# Patient Record
Sex: Male | Born: 1950 | Race: White | Hispanic: No | Marital: Married | Smoking: Former smoker
Health system: Southern US, Community
[De-identification: ages and names within clinical notes are randomized; demographics above are authoritative.]

## PROBLEM LIST (undated history)

## (undated) DIAGNOSIS — I10 Essential (primary) hypertension: Secondary | ICD-10-CM

## (undated) DIAGNOSIS — R569 Unspecified convulsions: Secondary | ICD-10-CM

## (undated) DIAGNOSIS — R51 Headache: Secondary | ICD-10-CM

## (undated) DIAGNOSIS — F329 Major depressive disorder, single episode, unspecified: Secondary | ICD-10-CM

## (undated) DIAGNOSIS — M549 Dorsalgia, unspecified: Secondary | ICD-10-CM

## (undated) DIAGNOSIS — K2270 Barrett's esophagus without dysplasia: Secondary | ICD-10-CM

## (undated) DIAGNOSIS — M1990 Unspecified osteoarthritis, unspecified site: Secondary | ICD-10-CM

## (undated) DIAGNOSIS — Z8719 Personal history of other diseases of the digestive system: Secondary | ICD-10-CM

## (undated) DIAGNOSIS — I2510 Atherosclerotic heart disease of native coronary artery without angina pectoris: Secondary | ICD-10-CM

## (undated) DIAGNOSIS — I639 Cerebral infarction, unspecified: Secondary | ICD-10-CM

## (undated) DIAGNOSIS — G8929 Other chronic pain: Secondary | ICD-10-CM

## (undated) DIAGNOSIS — K219 Gastro-esophageal reflux disease without esophagitis: Secondary | ICD-10-CM

## (undated) DIAGNOSIS — F419 Anxiety disorder, unspecified: Secondary | ICD-10-CM

## (undated) DIAGNOSIS — I509 Heart failure, unspecified: Secondary | ICD-10-CM

## (undated) DIAGNOSIS — F32A Depression, unspecified: Secondary | ICD-10-CM

## (undated) DIAGNOSIS — I739 Peripheral vascular disease, unspecified: Secondary | ICD-10-CM

## (undated) DIAGNOSIS — I679 Cerebrovascular disease, unspecified: Secondary | ICD-10-CM

## (undated) DIAGNOSIS — E785 Hyperlipidemia, unspecified: Secondary | ICD-10-CM

## (undated) HISTORY — DX: Gastro-esophageal reflux disease without esophagitis: K21.9

## (undated) HISTORY — DX: Cerebrovascular disease, unspecified: I67.9

## (undated) HISTORY — DX: Peripheral vascular disease, unspecified: I73.9

## (undated) HISTORY — PX: COLONOSCOPY: SHX174

## (undated) HISTORY — PX: HERNIA REPAIR: SHX51

## (undated) HISTORY — DX: Barrett's esophagus without dysplasia: K22.70

## (undated) HISTORY — DX: Cerebral infarction, unspecified: I63.9

## (undated) HISTORY — PX: LAPAROSCOPIC NISSEN FUNDOPLICATION: SHX1932

## (undated) HISTORY — PX: ESOPHAGOGASTRODUODENOSCOPY: SHX1529

## (undated) HISTORY — PX: CARDIAC CATHETERIZATION: SHX172

## (undated) HISTORY — PX: DIAGNOSTIC LAPAROSCOPY: SUR761

## (undated) HISTORY — DX: Essential (primary) hypertension: I10

## (undated) HISTORY — DX: Hyperlipidemia, unspecified: E78.5

## (undated) HISTORY — DX: Atherosclerotic heart disease of native coronary artery without angina pectoris: I25.10

---

## 2007-09-04 ENCOUNTER — Inpatient Hospital Stay (HOSPITAL_COMMUNITY): Admission: EM | Admit: 2007-09-04 | Discharge: 2007-09-09 | Payer: Self-pay | Admitting: Emergency Medicine

## 2007-09-10 ENCOUNTER — Inpatient Hospital Stay (HOSPITAL_COMMUNITY): Admission: EM | Admit: 2007-09-10 | Discharge: 2007-09-12 | Payer: Self-pay | Admitting: Emergency Medicine

## 2007-10-12 ENCOUNTER — Inpatient Hospital Stay (HOSPITAL_COMMUNITY): Admission: EM | Admit: 2007-10-12 | Discharge: 2007-10-14 | Payer: Self-pay | Admitting: Emergency Medicine

## 2007-10-29 ENCOUNTER — Ambulatory Visit: Payer: Self-pay | Admitting: Internal Medicine

## 2007-11-02 HISTORY — PX: CORONARY ANGIOPLASTY WITH STENT PLACEMENT: SHX49

## 2007-11-03 ENCOUNTER — Inpatient Hospital Stay (HOSPITAL_COMMUNITY): Admission: EM | Admit: 2007-11-03 | Discharge: 2007-11-05 | Payer: Self-pay | Admitting: Emergency Medicine

## 2008-09-02 HISTORY — PX: LAPAROSCOPIC GASTRIC BANDING: SHX1100

## 2010-05-31 DIAGNOSIS — K219 Gastro-esophageal reflux disease without esophagitis: Secondary | ICD-10-CM | POA: Insufficient documentation

## 2011-01-15 NOTE — Cardiovascular Report (Signed)
NAME:  Justin Wells, Justin Wells NO.:  [id number]   MEDICAL RECORD NO.:  [id number]          PATIENT TYPE:  INP   LOCATION:  NA                           FACILITY:  [facility]   PHYSICIAN:  Vonna Kotyk R. Jacinto Halim, MD       DATE OF BIRTH:  [birth year]   DATE OF PROCEDURE:  09/11/2007  DATE OF DISCHARGE:                            CARDIAC CATHETERIZATION   ATTENDING CARDIOLOGIST:  Nanetta Batty, M.D.   PROCEDURE PERFORMED:  1. PTCA and balloon angioplasty of the previously placed 2.5 x 15 mm      overlapping stent into the circumflex and obtuse marginal branch,      performed on September 07, 2007.  2. PTCA and balloon angioplasty of the obtuse marginal one branch of      the circumflex coronary artery.  3. PTCA and balloon angioplasty of the diagonal one branch of the LAD.   INDICATION:  Mr. Justin Wells is a 60 year old gentleman with known  coronary artery disease who was admitted with unstable angina and  underwent PTCA and stenting of mid circumflex coronary artery by Dr.  Nanetta Batty with implantation of a 2.5 x 15 mm overlapping stent into  the main body of the mid circumflex and into the obtuse marginal two  branch.  Distal circumflex had no significant stenosis and was left  alone.  Because of recurrent chest discomfort and shortness of breath,  and he has known 90% stenosis in the diagonal one branch of the LAD, he  was brought back to the cardiac catheterization lab to reevaluate his  coronary stents and also to look for possible angioplasty of the  diagonal branch of the LAD.   ANGIOGRAPHIC DATA:  Left main:  The left main is large and normal.   Circumflex coronary artery:  The circumflex coronary artery has an acute  angle take-off.  It is smooth in the proximal segment.  It gives origin  to a very small obtuse marginal one, which has a high-grade 99% proximal  to mid stenosis.  Followed by this, the circumflex gives origin to a  large obtuse marginal two.  The  previously placed stents in the obtuse  marginal and extending into the body of the mid circumflex are widely  patent; however, the proximal segment of the stent appeared to be not  completely apposed to the artery.   LAD:  The LAD is a large-caliber vessel.  Gives origin to a moderate-to  large-sized diagonal one, which had a focal 90% stenosis.  Just distal  to the stenosis, there was a hinge point noted in the diagonal one  branch.  The LAD itself is smooth and normal and the mid segment has  significant intramyocardial bridging with almost near- complete  obliteration of the LAD during systole.   INTERVENTION DATA:  Difficult procedure.  Successful PTA and balloon  angioplasty of the circumflex stent with dilatation with a 2.75 x 8 mm  Quantum balloon performed at 20 atmospheres of pressure.  Multiple  balloon inflations were performed throughout the stent and also the in-  flow of the stent.  Post balloon angioplasty, I was able to easily  advance the balloon without any difficulty from the left main into the  circumflex coronary artery.  Previously this had been difficult, and the  balloon was hanging onto the stent strut.  TIMI III flow was maintained  at the end of the procedure.   Successful PTCA and balloon angioplasty of the proximal to mid segment  of the small obtuse marginal one branch with a 1.5 x 15 mm Voyager  balloon performed at anywhere from 3 to 14 atmospheres of  pressure,  anywhere from 45 seconds to 90 seconds.  Post balloon angioplasty, a 99%  stenosis was reduced to less than 10% with excellent TIMI III flow.  There was no evidence of dissection.   Successful PTCA and balloon angioplasty of the diagonal one branch of  the LAD with reduction of stenosis from 90% to less than 10%.  A 2.5 x  15 mm Voyager balloon was utilized and a peak atmosphere pressure of 16  atmospheres of pressure was utilized for balloon angioplasty into the  diagonal branch.  Excellent  TIMI III flow was maintained without any  residual dissection or thrombus.   RECOMMENDATIONS:  The patient will be continued on aggressive risk  modification.  He has no significant coronary disease in the right  coronary artery.  He will need risk modification.   A total of 270 mL of contrast was utilized for diagnostic angiography.   TECHNIQUE:  Procedure in the usual sterile precautions using a 69F right  femoral arterial access site using Angiomax for anticoagulation.  Initially a 7-French FL4 guide was utilized for attempted angioplasty of  the stents; however, because of difficulty in advancing the wire and  also once I had the wire passing through the obtuse marginal branch, I  was having difficulty in advancing the balloon.  I had very poor guide  support; hence, all the  equipment was withdrawn and a 7-French XB 3.5  guide was utilized to engage left main coronary artery.  With this, I  was able to advance the [facility] and also because of inability to  advance a 3.0 x 15 mm Voyager balloon over the guidewire, I utilized a  Wiggle wire and again had difficulty.  I then exchanged this to a 2.75 x  8 mm Quantum Maverick, and again I had difficulty in advancing the wire  even proximal to the stent and also at the in-flow of the stent site.  Hence a BMW wire was utilized and I was able to prolapse this wire  through the stent and placed it distally into the distal obtuse marginal  two.  Then I was able to advance the balloon, and multiple balloon  angioplasty was performed throughout the entire segment of the mid  segment of the circumflex and also into the stent struts at the in-flow  at peak 20 atmospheres of pressure.  Having performed this angioplasty,  excellent results were noted.  There was mild spasm noted in the outflow  of the stent; however, this was relieved with intracoronary  nitroglycerin.  TIMI III flow was maintained.  Then the BMW guidewire  was gently  withdrawn back and advanced into the obtuse marginal one,  which was a tiny vessel which had a high-grade 99% stenosis.  I used a  1.5 x 15 mm Voyager balloon and multiple balloon angioplasties were  performed, anywhere from 3 to 14 atmospheres of  pressure.  Having  performed this, excellent  results were noted.  Then I advanced the same  BMW guidewire into the diagonal one branch of the LAD and the 1.5  balloon was utilized and multiple angioplasties were performed, again up  to 14 atmospheres of pressure.  Because of significant residual stenosis  of greater than 50%, I then used the 2.0 x 15 mm Voyager and again 14  atmospheres of pressure balloon dilatation was performed for 90 seconds.  Post balloon angioplasty, excellent results were noted.  Having  performed this, guidewire was withdrawn, angiography repeated , guide  catheter disengaged and pulled out of the body.  Right femoral  arteriography was performed through the arterial access sheath, access  closed with StarClose with excellent hemostasis.  The patient tolerated  the procedure.  No immediate complications noted.      Cristy Hilts. Jacinto Halim, MD  Electronically Signed     JRG/MEDQ  D:  09/11/2007  T:  09/11/2007  Job:  [id number]   cc:   Nanetta Batty, M.D.

## 2011-01-15 NOTE — Cardiovascular Report (Signed)
NAME:  Justin Wells, Justin NO.:  [id number]   MEDICAL RECORD NO.:  [id number]          PATIENT TYPE:  INP   LOCATION:  6524                         FACILITY:  [facility]   PHYSICIAN:  Madaline Savage, M.D.DATE OF BIRTH:  03-12-1951   DATE OF PROCEDURE:  10/13/2007  DATE OF DISCHARGE:                            CARDIAC CATHETERIZATION   PROCEDURES PERFORMED:  1. Selective coronary angiography by Judkins technique.  2. Retrograde left heart catheterization.  3. Left ventricular angiography.   INTERVENTIONS:  None.   COMPLICATIONS:  None.   ENTRY SITE:  Right femoral.   PATIENT PROFILE:  Justin Wells is a 60 year old gentleman who is a  cardiology patient of Dr. Nanetta Batty and a primary care patient of  Dr. Earlene Plater.  He has had previous percutaneous coronary artery stenting of  his circumflex obtuse marginal branch proximally, and was admitted to  the hospital recently with another episode of chest pain that is nitrate  responsive.  Because of the recurrence of the chest pain symptoms, this  cardiac catheterization is performed.  It was done so electively without  any immediate complications.   RESULTS:  Pressures:  Left ventricular pressure was 127/14, end-  diastolic pressure 28.  Central aortic pressure was 117/64, mean of 85,  the 10 mm aortic valve gradient is at the upper limits of normal for  aortic valve gradients.   ANGIOGRAPHIC RESULTS:  The patient shows a radio-opaque stent in his  proximal circumflex coronary artery that is a radio-opaque and easily  visible on ventriculogram.  The ventriculogram itself shows normal  contractility with a little ectopy and no evidence of any significant  wall motion abnormalities.   The left main coronary artery is normal.  The left anterior descending  coronary artery is normal.  The diagonal branch of the left anterior  descending coronary artery is a long, medium size vessel approximately  2.75 mm diameter that has  lumpy bumpy irregularities in two spots, but  nothing that is obstructive in appearance.  The LAD itself looks normal.   The circumflex coronary artery contains a radio-opaque stent at the  junction where the circumflex itself and the large obtuse marginal  branch bifurcates.  There is noted to be a radio-opaque stent that is  widely patent.  There is a little bit of a discrepancy between the stent  diameter and the immediate post stent appearance after the stent, but  there is no evidence of blood flow abnormalities or any appearance of a  lesion.  Similarly, there does not appear to be a stenosis at the  junction between the circumflex and the obtuse marginal branch.   Right coronary artery was previously a small vessel that was not  intervened upon during his last catheterization and this time the vessel  was injected with intracoronary nitroglycerin 200 mcg and appeared to be  at least a 2.5 mm vessel with 30% area of eccentric luminal irregularity  midway down the vessel.  It was smooth.  It does not appear to be  significant enough to be causing symptomatology.   Left ventricular angiography shows  vigorous contractility of all wall  segments with an ejection fraction of 60% and no evidence of mitral  regurgitation.   FINAL IMPRESSION:  1. Patent stent to the circumflex obtuse marginal branch.  New lesions      of 30% in mid right coronary artery and proximal diagonal branch of      the left anterior descending.  2. Normal LV systolic function.   PLAN:  The patient may be eligible for discharge this evening, assuming  that no groin complications or recurrent neurologic events occur this  evening.  He would then follow up with Dr. Nanetta Batty as an  outpatient.           ______________________________  Madaline Savage, M.D.     WHG/MEDQ  D:  10/13/2007  T:  10/14/2007  Job:  [id number]   cc:   [facility]

## 2011-01-15 NOTE — Discharge Summary (Signed)
NAME:  Justin Wells, Justin Wells NO.:  [id number]   MEDICAL RECORD NO.:  [id number]          PATIENT TYPE:  INP   LOCATION:  6524                         FACILITY:  [facility]   PHYSICIAN:  Nanetta Batty, M.D.   DATE OF BIRTH:  July 10, 1951   DATE OF ADMISSION:  10/12/2007  DATE OF DISCHARGE:  10/14/2007                               DISCHARGE SUMMARY   DISCHARGE DIAGNOSES:  1. Chest pain with patent stent to the obtuse marginal-II and      nonobstructive disease of only 30% to the diagonal and right      coronary artery.  Negative myocardial infarction.  2. Recurrent chest pain, though cardiac with certainly initial      problem.  At this point, rule out gastrointestinal or cervical      disk.  3. Dizziness and headache, post-cardiac catheterization x3, we      question secondary reaction.  4. Anxiety disorder.  5. Recent off-balance symptoms, improved with iron.  6. History of hypertension.  7. 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  8. History of Barrett's esophagus.  9. Status post Nissen fundoplication.  10.Peptic ulcer disease.   CONDITION ON DISCHARGE:  Improved.   PROCEDURE:  On October 13, 2007, left heart catheterization by Dr.  Madaline Savage with patent stent to the obtuse marginal-II and  minimal disease of the diagonal and right coronary artery, with normal  ejection fraction.   DISCHARGE MEDICATIONS:  1. Valium 5 mg twice daily, as before.  2. Simvastatin 40 mg daily.  3. Plavix 75 mg daily.  4. Aspirin 162 mg daily.  5. Prilosec 20 mg, increased to twice daily.  6. Paxil 40 mg daily.  7. Ziac 10/6.25 mg daily as before.  8. Xanax 0.25 mg p.r.n.  9. Darvocet-N 100, one to two tab q.6h. p.r.n.  10.Percocet one to two tab q.6h. p.r.n.  11.Nitroglycerin sublingual p.r.n.  12.Diovan 80 mg daily.  13.Isosorbide dinitrate 10  mg, one three times daily.  Was given a      prescription as an outpatient prior to this admission.   CONSULTATIONS:  Please note that a GI consultation was obtained with Dr.  Wilhemina Bonito. Marina Goodell.  The recommendations from them were to consider a C-spine  MRI for degenerative disk disease as a cause of his pain.  Increase the  PPI to twice daily for four to six weeks, to see if this helps.  Also  consider a gallbladder ultrasound as an outpatient.  The patient to  receive GI care with his gastroenterologist.   HISTORY OF PRESENT ILLNESS:  The patient has had two catheterizations  prior to this admission, presented to [facility] on October 12, 2007, with chest pain.  Over the week before he had been having daily  chest pain episodes.  On the day of admission he was diaphoretic and  felt terrible with chest tightness.  He took nitroglycerin which  improved his pain, but then he  developed increased anxiety and took his  anti-anxiety medication.  He just felt significant worse than he had in  a while, so he went to [facility] and was transferred to [facility].   The patient had also, within the week prior this, had a Persantine  Myoview in our office that was negative for ischemia.   PAST MEDICAL/SURGICAL HISTORY:  1. Coronary artery disease with history of stent in the OM-II.  2. After that procedure he developed a headache and off balance.  He      actually went home and was then readmitted within 24 hours.  A      neuro consultation was obtained, and they did diagnose him with a      CVA, a small left-sided brain stem infarction.  He was placed on      Valium and has been weaned off of that to only as needed at this      point for balance disturbance, which has now improved.  3. Anxiety disorder, which he previously did not have that we are      aware of.  4. Hypertension.  5. Dyslipidemia.  6. History of Barrett's esophagus.  7. History of Nissen  fundoplication.  8. Peptic ulcer disease.   ALLERGIES:  Cough developed with ACE inhibitor.   FAMILY/SOCIAL HISTORY/REVIEW OF SYSTEMS:  See H&P.   DISCHARGE PHYSICAL EXAMINATION:  VITAL SIGNS:  Blood pressure 125/71,  pulse 56.  EXTREMITIES:  His right groin catheterization site was stable.   NOTATION:  He was felt to be ready to be discharged, but when I went  back to review his discharge medications, he was having chest pain and  also a bad headache.  I gave him Darvocet for his headache.  I also gave  him a GI cocktail.  Shortly thereafter the GI symptoms were resolved.  We called a GI consultation.  Dr. Yancey Flemings evaluated the patient and  recommendations, as previously stated.   DISPOSITION:  The patient was without complaints that afternoon, and  felt ready to discharge.   DIET:  A low-sodium, heart-healthy diet.   ACTIVITY:  Increase activity slowly.   DISCHARGE INSTRUCTIONS:  1. May shower or bathe.  2. No lifting for two days.  3. No driving for two days.  4. Wash right groin catheterization site with soap and water.  Do not      cause any bleeding.   FOLLOWUP:  To see Dr. Nanetta Batty on October 28, 2007, at 10:30 a.m.      Darcella Gasman. Annie Paras, N.P.      Nanetta Batty, M.D.  Electronically Signed    LRI/MEDQ  D:  11/02/2007  T:  11/02/2007  Job:  [id number]   cc:   Lois Huxley, M.D.

## 2011-01-15 NOTE — Discharge Summary (Signed)
NAME:  Justin Wells, MAIORINO NO.:  [id number]   MEDICAL RECORD NO.:  [id number]          PATIENT TYPE:  INP   LOCATION:  2001                         FACILITY:  [facility]   PHYSICIAN:  Nanetta Batty, M.D.   DATE OF BIRTH:  1951-03-15   DATE OF ADMISSION:  11/02/2007  DATE OF DISCHARGE:  11/05/2007                               DISCHARGE SUMMARY   DISCHARGE CONDITION:  Improved.   DISCHARGE DIAGNOSES:  1. Chest pain.      a.     Noncardiac chest pain.      b.     Negative myocardial infarction with negative CK, MB and       troponin.  2. Recent cardiac catheterization, actually his third in 2 months,      with patent stent and nonobstructive coronary disease.  3. Known coronary artery disease with stent to the obtuse marginal #2.  4. Cervical disk disease with spondylitis, cervical spine, multilevel      neural foraminal stenosis, left greater than right, C4-5 level is      most severely affected, affected left greater than right.      a.     No neurologic deficit.  5. Gastroesophageal reflux disease.  (Please note, the patient states      on Nexium he never has this type of pain.  Once the Nexium was      changed to Prilosec from [employer], the pain started).  6. History of Barrett's esophagus with  Nissen fundoplication and      redo.  7. Dyslipidemia.  8. Cerebrovascular accident, mild, in January 2009.  9. Anxiety.  10.Hypertension, controlled.  11.Lower extremity edema due to medication changes during this      hospitalization.   DISCHARGE MEDICATIONS:  1. Six-day Medrol Dosepak per Dr. Phoebe Perch.  2. Protonix 40 mg one twice a day for one week, and then daily.      Please note, insurance would not pay for Protonix in the past but      he has now failed Prilosec.  3. Simvastatin 40 mg daily.  4. Plavix 75 mg daily.  5. Aspirin 81 mg two daily.  6. Paxil 40 mg daily.  7. Ziac 10/6.25 mg one daily.  8. Valium 5 mg twice a day as needed as before.  9.  Xanax 0.25 mg every 12 hours as needed.  10.Diovan 80 mg daily.  11.Nitroglycerin sublingual as needed for chest pain.  12.Hydrocodone 10/325 mg one every 4 hours as needed for headache or      take a Percocet.   FOLLOW-UP:  1. With Dr. Phoebe Perch of neurosurgery.  The office will call with date      and time in 4-6 weeks.  2. Follow up with your gastroenterologist in 1-2 weeks.  3. Follow up with primary care, Dr. Earlene Plater, this week for further      management.  4. Dr. Phoebe Perch recommended outpatient physical therapy, which Dr.      Alanda Amass believes should wait until after the steroid Dosepak is      completed.  He did not  feel there needed to be any surgical      intervention currently but treat with outpatient physical therapy      or also could try a trial of traction.  Nonsteroidals may be      helpful, per Dr. Phoebe Perch, but with aspirin and Plavix and GI history      it is probably not practical.   Please note:  The patient and his wife were very frustrated.  These  symptoms continue to proceed and while the cervical disk disease is  responsible for his paresthesias in the left arm and probable headache,  all of his symptoms cannot be related to that.  Our cardiologists do not  feel this is cardiac and we do feel a GI workup is warranted with his  significant history.   Please note:  The patient stated when he was no Nexium he had none of  these problems and the Prilosec has never helped his stomach pain.  Therefore, we will hope to get either Protonix or Nexium to attempt as  he has failed Prilosec.   Patient and wife prefer to go back and see Dr. Earlene Plater, as we have cleared  him from a cardiology standpoint, for further direction.  The wife at  one point during the hospitalization wanted to go back to [facility]  for further evaluation, which we agree with, as they have seen him in  the past, and Dr. Earlene Plater will arrange that for him if he thinks that is  appropriate.   HISTORY OF  PRESENT ILLNESS:  A 60 year old white male with coronary  disease, stent to the circumflex OM and angioplasty of the diagonal  branch in January 2009, presented to [facility] on November 03, 2007, again with  acute onset of chest pain while driving, substernal chest pain radiating  to the neck, left arm discomfort, numbness, tingling.  He becomes  profusely diaphoretic.  Even before he states he has the pain, his wife  will look at him and he is extremely diaphoretic.  He also gets short of  breath and nauseated.  The wife called EMS.  He was given sublingual  nitroglycerin with only mild relief.  He was a little bradycardic but no  acute changes otherwise on his EKG.  Once he got to the hospital on IV  nitrate, he was pain-free.  He had a negative Persantine Myoview on  September 28, 2007, and then a heart catheterization following that, which  again revealed patent stent and patent coronary arteries.  He was put on  long-acting nitrates as well.   At one point during the prior hospitalization the morning after his  catheterization, he had return chest pain.  He was given a GI cocktail,  which totally relieved those symptoms at that time.  He does continue  with headaches, though.   Other history as described in his past medical history:  He had a mild  CVA after initial PCI that was very lightheaded and dizzy and Dr. Earlene Plater  put him on a tapering dose of Valium, which resolved his dizziness.   Family history, social history, review of systems:  Please see H&P.   ALLERGIES:  He does have an ACE cough but no allergies.   PHYSICAL EXAM AT DISCHARGE:  Blood pressure 122/65, pulse 78,  temperature 98.  HEART:  Regular rate and rhythm.  He was in sinus rhythm.  CHEST:  Clear.   LABORATORY DATA:  Hemoglobin on admission 13.7, hematocrit 39.5, WBC  10.2, platelets 189, MCV 90, neutrophils 76.  White count slightly up at  11.4 at discharge.  This was after IV Decadron was given, though.  Chemistry:   Sodium 134, potassium 4.3, chloride 103, CO2 25, BUN 10,  creatinine 1.01, glucose was 190, again this was after Decadron,  previously his glucose on admission.  PT 12.9, INR of 1, PTT 31.  GI:  AST was normal at 24, ALT 29, alkaline phosphatase 59, total bilirubin  0.8.  Amylase 54, lipase 20.  Albumin 3.9.  Cardiac enzymes were  negative.  Troponin 0.02 x3.  CKs range 87-114 with MBs 1.4-1.9.  Negative MI.  Total cholesterol 119, LDL 62, HDL 34, triglycerides 108.  Electrolytes:  Calcium is 9.3, magnesium 2.0.  Glycohemoglobin was 5.1.  TSH 1.966.  H. pylori was negative.  Cervical MRI:  C2-3, mild bilateral  facet hypertrophy, otherwise normal.  C3-4, left greater than right  facet uncovertebral hypertrophy resulting in mid-left neural foraminal  stenosis, otherwise normal.  C5 radiculitis.  C5-6 was normal.  C6-7,  mild right uncovertebral hypertrophy resulting in mild to moderate right  neural foraminal stenosis, otherwise normal.  The C4-5 level was most  severely affected, left greater than right, may be source for subsequent  C5 radiculitis.  No central spinal stenosis or mass effect on the spinal  cord.   HOSPITAL COURSE:  The patient was admitted and placed on IV heparin and  nitroglycerin.  His Ziac was stopped and he was put on Lopressor here  but by the end of his stay started having increasing edema so was  discharged back on his Ziac, which has hydrochlorothiazide.   We did get a GI consult.  They were going to do an EGD but then the MRI  of the cervical spine showed stenosis and definitely some of his  symptoms are related to this, but they stopped the EGD.  They would  prefer him to see his own gastroenterologist before we proceeded.  He  also may be having slow gastric emptying.   On March 4 we had a neurosurgeon, Dr. Phoebe Perch, see the patient, who  recommended steroid dosing.  Of course, if this does not help he may be  a candidate for epidural steroid injection as  well.  He did get three  doses of IV Decadron before he had to leave the hospital.  He will be on  an outpatient Medrol Dosepak for 6 days.  He will follow up with Dr.  Earlene Plater, who may proceed to set him up for appointments at [facility] for  further GI evaluation and further cervical evaluation if the patient  chooses to go there instead of back to [facility] for further management.  Additionally, the physical therapy Dr. Earlene Plater will arrange as there is a  place at [facility] he can use to go to, and the patient's wife agree  that she would prefer it to be there.  At time of discharge Dr.  Alanda Amass talked to the patient and his wife at length to review all the  issues and that he again did not feel this was cardiac, but the patient  has had multiple problems in the past.  They were both anxious, continue  with significant pain, headache is his greatest pain complaint at this  point.   We will see him back in follow-up, certainly to call if they have  further questions or need further assistance.      Darcella Gasman. [place], [place].P.  Nanetta Batty, M.D.  Electronically Signed    LRI/MEDQ  D:  11/05/2007  T:  11/05/2007  Job:  [id number]   cc:   Lois Huxley, MD  Clydene Fake, M.D.  [facility]

## 2011-01-15 NOTE — Cardiovascular Report (Signed)
NAME:  Justin Wells, Justin Wells NO.:  [id number]   MEDICAL RECORD NO.:  [id number]          PATIENT TYPE:  INP   LOCATION:  2807                         FACILITY:  [facility]   PHYSICIAN:  Nanetta Batty, M.D.   DATE OF BIRTH:  1950/09/29   DATE OF PROCEDURE:  DATE OF DISCHARGE:                            CARDIAC CATHETERIZATION   Justin Wells is a 60 year old mildly overweight married white male,  history hypertension, GERD, depression and Barrett's esophagus, status  post Nissen fundoplication x2 in the past.  He is a remote tobacco  abuser.  He was admitted September 04, 2007, with chest pain, diaphoresis  and near-syncope along with shortness of breath.  He ruled out for  myocardial function.  There were no acute EKG changes.  He was  heparinized and presents now for diagnostic angiography to define his  anatomy and rule out an ischemic etiology.   DESCRIPTION OF PROCEDURE:  The patient was brought to the second floor  [facility] cardiac cath lab in a postabsorptive state.  He was  premedicated with p.o. Valium, IV Versed and fentanyl.  His right groin  was prepped and shaved in the usual sterile fashion, 1% Xylocaine was  used for local anesthesia.  A 6-French sheath was inserted into the  right femoral artery using standard Seldinger technique.  Right and left  6-French Judkins diagnostic catheters as well as a 6-French pigtail  catheter were used for selective coronary angiography, left  ventriculography and distal abdominal aortography.  Visipaque dye was  used for the entirety of the case.  Retrograde aortic, left ventricular  and pullback pressures were recorded.   HEMODYNAMIC RESULTS:  1. Aortic:  Aortic systolic pressure 140, diastolic pressure 79.  2. Left ventricular systolic pressure 130, end-diastolic pressure 17  3. There did not appear to be a pullback gradient.   SELECTIVE CORONARY ANGIOGRAPHY:  1. Left main:  Normal.  2. LAD:  LAD gave off a large first  diagonal, which was the almost      equal in caliber to the LAD itself.  The diagonal branch had an 80%      segmental mid stenosis.  The LAD itself had 30% segmental mid      stenosis.  3. Left circumflex:  This was a codominant vessel.  It gave off a      small first marginal branch.  It had a fairly focal proximal 80%      stenosis in its proximal third.  There was a hypodense lesion in      the AV groove circumflex just prior the takeoff of a second large      marginal branch, which was felt to have 80% segmental stenosis in      its proximal to midportion.  There was a third small marginal      branch and then a small PDA, which were free of significant      disease.  4. Right coronary artery:  This a small, codominant vessel which was      free of significant disease.   LEFT VENTRICULOGRAPHY:  RAO left ventriculogram  was performed using of  25 mL of Visipaque dye at 12 mL/sec.  The overall LVEF was estimated at  greater than 60% without focal wall motion abnormality.   DISTAL ABDOMINAL AORTOGRAPHY:  Distal abdominal aortogram was performed  using 20 mL of Visipaque dye at 20 mL/sec.  The renal arteries were  widely patent.  The infrarenal abdominal aorta and iliac bifurcation  appear free of significant atherosclerotic changes.   IMPRESSION:  Justin Wells has what appears to be a physiologically  significant codominant AV groove circumflex and obtuse marginal lesion  as well as a diagonal branch lesion in a moderate to large diagonal  branch.  We will proceed with IVUS-guided percutaneous coronary  intervention and stenting of his obtuse marginal and circumflex using a  drug-eluting stent and Angiomax.   DESCRIPTION OF PROCEDURE:  The existing 6-French sheath in the right  femoral artery was exchanged over a wire for a 7-French sheath.  The  patient was on aspirin and received 600 mg of Plavix p.o. along with 20  mg of Pepcid IV for heartburn related to this.   Using a  7-French XB 3.5 guide catheter along with two Cornelius Moras 4190 size  soft wires and a 3.2 [place] Galaxy IVUS catheter, intravascular  ultrasound was performed of the AV groove circumflex.  The distal  reference measured 2.5 x 2.6.  The lesion itself just prior to the  takeoff of OM-1 had a luminal circumference of 2.58 sq. mm and the  lesion measured 1.67 x 1.94.  The proximal reference in the AV groove  circumflex was approximately 3.3 x 3.5; however, the lumen within the  lesion measured about 2.75 x 2.75.  The IVUS catheter occluded the  circumflex, resulting in ST-segment elevation, hypotension, bradycardia,  nausea, vomiting and diaphoresis.  This resolved after administration of  some nitroglycerin, removal of the IVUS catheter and treatment with  fluid resuscitation and atropine with resolution of ST-segment elevation  and an increase in heart rate and blood pressure.   A wire was then placed into the circumflex obtuse marginal branch #1  using double wire technique.  Using a Promus 21.5 x 15 drug-eluting  stent, this was placed across the wire into OM-1 and deployed at 12  atmospheres, resulting in a reduction of 80% proximal OM-1 stenosis to  0% residual.  Following this the AV groove circumflex wire was withdrawn  and another 2.5 x 15 Promus stent was then placed across the  continuation of the AV groove circumflex straddling OM-1 within the  lesion and deployed at 12-14 atmospheres, resulting in reduction of an  80% lesion to 0% residual.  There was maintenance of patency of the AV  groove circumflex beyond the OM-1, which was jailed by the stent.  The  patient tolerated the procedure well.  The guidewire and catheter were  removed.  The sheath was sewn securely in place.  He left the lab in  stable condition.   Plans will be to treat him with aspirin and Plavix and probably  discharge him home in the morning with consideration for a staged  diagonal branch intervention at some  point in the near future.  He left  the lab in stable condition.      Nanetta Batty, M.D.  Electronically Signed     JB/MEDQ  D:  09/07/2007  T:  09/07/2007  Job:  [id number]   cc:   [facility]  Lois Huxley, MD

## 2011-01-15 NOTE — Discharge Summary (Signed)
NAME:  Justin Wells, Justin Wells NO.:  [id number]   MEDICAL RECORD NO.:  [id number]          PATIENT TYPE:  INP   LOCATION:  6523                         FACILITY:  [facility]   PHYSICIAN:  Nanetta Batty, M.D.   DATE OF BIRTH:  [birth year]   DATE OF ADMISSION:  09/10/2007  DATE OF DISCHARGE:  09/12/2007                               DISCHARGE SUMMARY   DISCHARGE DIAGNOSES:  1. Coronary disease, recent OM2 and circumflex Promus stenting September 07, 2007, with in-stent restenosis this admission treated with      angioplasty.  2. Residual disease in the diagonal and OM1 also treated with      angioplasty this admission.  3. Apparent small brainstem stroke after the patient's initial      intervention September 07, 2007, with dizziness and headaches, seen by      Dr. [name] this admission.  4. Treated hypertension.  5. Treated dyslipidemia.  6. History of a Nissen fundoplication and Barrett's esophagus.   HOSPITAL COURSE:  The patient a 60 year old male who was recently seen  for unstable angina and had a Promus stent to the OM2 and circumflex.  He had some residual disease at that time in his diagonal that was to be  treated medically.  He was discharged but presented to Dr. Hazle Coca  office the day after discharge with complaint of headache and dizziness  and chest pressure.  He was admitted to [facility].  We were unable to  do an MRI because of the recently placed bare mental stent.  CT scan was  negative for acute MI.  The patient was seen by Dr. [name].  Dr. [name]  feels the patient has a small brain stem infarct not seen on CT.  Carotid Dopplers are ordered but these can be done as an outpatient.  The patient was taken to the cath lab September 11, 2007.  He had in-stent  restenosis in the circumflex OM2 stent that was dilated.  He also had a  99% OM1 stenosis that was angioplastied and a diagonal stenosis that was  angioplastied.  It was a difficult procedure but the patient  tolerated  it well.  We feel he could be discharged September 12, 2007.  He still has  complaints of headache and dizziness.  He will follow up with Dr. [name]  as an outpatient after he gets his carotid Dopplers.  He will see Dr.  Allyson Sabal also next week.   DISCHARGE MEDICATIONS:  1. Nitroglycerin sublingual p.r.n. for chest pain.  2. Simvastatin 40 mg a day.  3. Lotensin 5 mg a day.  4. Plavix 75 mg a day.  5. Prilosec 20 mg a day.  6. Xanax 0.25 mg q.8 h p.r.n.  7. Paxil 40 mg a day.  8. Ziac 10/62.5 daily.  9. Aspirin 325 mg a day.  10.Darvocet-N 100 1-2 q.6 h p.r.n.  11.Percocet 1-2 q.6 h p.r.n. if needed.   LABS:  White count 7.8, hemoglobin 14, hematocrit 40.7, platelets 176.  Sodium 134, potassium 4.1, BUN 12, creatinine 1.0, troponin is negative  although  on admission his troponin was 0.12, at discharge his troponin  is 0.06.  Homocysteine level is 11, hemoglobin A1c is 5.6.  TSH is 4.47.  Chest x-ray stable.  EKG sinus rhythm, sinus bradycardia without acute  changes.   DISPOSITION:  The patient is discharged in stable condition.  He will  follow up with Dr. Allyson Sabal after he gets his carotid Dopplers next week.  He will also need to follow up with Dr. [name].  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 Allyson Sabal.      Abelino Derrick, P.A.      Nanetta Batty, M.D.  Electronically Signed    LKK/MEDQ  D:  09/12/2007  T:  09/13/2007  Job:  [id number]   cc:   Lois Huxley, Dr

## 2011-01-15 NOTE — Consult Note (Signed)
NAME:  Justin Wells, Justin Wells               ACCOUNT NO.:  [id number]   MEDICAL RECORD NO.:  [id number]          PATIENT TYPE:  INP   LOCATION:  6523                         FACILITY:  [facility]   PHYSICIAN:  [name] P. [name], MD    DATE OF BIRTH:  02-23-1951   DATE OF CONSULTATION:  09/10/2007  DATE OF DISCHARGE:                                 CONSULTATION   REFERRING PHYSICIAN:  Nanetta Batty, M.D.   REASON FOR REFERRAL:  Stroke.   HISTORY OF PRESENT ILLNESS:  Mr. Loberg is a 60 year old Caucasian male  who had cardiac angioplasty stenting procedure done on September 07, 2007.  He developed some dizziness and headache post procedure.  It was thought  to be medication related.  A CT scan was done which was unremarkable.  He was discharged home, but his headache, dizziness and some nausea and  gait balance difficulties persisted.  He came back today again with  chest pain and has been admitted, but he mentioned the  symptoms, and we  have been consulted to evaluate for stroke.  The patient denies any  slurred speech, double vision, but he does state that he has some  trouble with concentration and seeing.  He has no extremity weakness or  incoordination.  There is no prior history of stroke or TIA.   PAST MEDICAL HISTORY:  Significant for:  1. Coronary artery disease status post recent stent 3 days ago.  2. Hypertension.  3. Gastroesophageal reflux disease.  4. Barrett esophagus.  5. Depression.   HOME MEDICATIONS:  1. Ziac.  2. __________  .  3. Tiazac.  4. Paxil.  5. Xanax.  6. Zocor.  7. Aspirin.  8. Plavix.  9. Prilosec.  10.Lotensin.   ALLERGIES:  None known.   FAMILY HISTORY:  Noncontributory.   SOCIAL HISTORY:  He is married, lives with his wife; does not smoke or  drink.   REVIEW OF SYSTEMS:  Comprehensive 10-system review of system is as  documented above.  Positive for chest pain and dizziness, gait  difficulties and nausea.   PHYSICAL EXAMINATION:  GENERAL:  Reveals  obese middle-aged Caucasian  male at present not in distress.  VITAL SIGNS:  Afebrile, pulse rate 61 per minute, regular respiratory  rate 16 per minute, blood pressure 114/64, sat is 98% on 2 liters.  HEAD:  Nontraumatic.  NECK:  Supple.  There is no bruit.  ENT EXAM:  Unremarkable.  CARDIAC EXAM:  No murmur or gallop.  LUNGS:  Clear to auscultation.  NEUROLOGICAL EXAM:  The patient is pleasant, awake, alert, cooperative.  There is no aphasia, apraxia, dysarthria and movements are full range;  however, there is mild left eye ptosis.  The left eye does not move  downwards well compared to the right eye; however, there is no  subjective diplopia.  Pupils are both equal and reactive.  There is  minimum left nasal labial fold asymmetry.  Tongue is midline.  Has good  cough and gag.  Motor system exam reveals no upper extremity drift.  He  has slightly diminished fine movements on the left, finger-to-nose  incoordination minimum on the left.  There is no lower extremity  weakness or __________  ataxia.  He has subjective decreased sensation  in the left body to touch and pinprick.  Gait was not tested.   DATA REVIEWED:  CT scan of the head done today as well as on January  2need, both are normal.   IMPRESSION:  56-year gentleman with sudden onset of headache, dizziness  and some imbalance following cardiac angioplasty, stenting 3 days ago,  likely due to small left-sided brain stem infarct not visualized on CT  scan 3 days ago.   PLAN:  The patient clearly has sign and symptoms consistent with a small  stroke which is subacute and 57 days old.  An MRI unfortunately cannot be  done because of recent stent; however, I will treat him conservatively,  continue antiplatelet therapy and aggressive risk factor modification;  physical, occupational, speech therapy consult.  Check carotid  ultrasound transcranial Doppler studies and hemoglobin A1c and  homocysteine levels.  Continue aggressive  treatment for lipids and blood  pressure.  I had long discussion with the patient and his wife and  answered questions.  Can recall for questions.           ______________________________  Sunny Schlein. [name], MD     PPS/MEDQ  D:  09/10/2007  T:  09/11/2007  Job:  [id number]   cc:   Nanetta Batty, M.D.

## 2011-05-23 LAB — CBC
HCT: 40.6
HCT: 40.7
HCT: 44.5
HCT: 47.1
Hemoglobin: 14
Hemoglobin: 14.4
Hemoglobin: 14.6
MCHC: 34.4
MCHC: 35.3
MCHC: 35.3
MCV: 87.9
MCV: 88.1
MCV: 88.6
MCV: 89.3
MCV: 89.5
Platelets: 170
Platelets: 174
Platelets: 189
Platelets: 195
Platelets: 202
RBC: 4.67
RBC: 4.72
RBC: 4.74
RBC: 5.01
RBC: 5.28
RDW: 13.4
RDW: 13.5
RDW: 13.6
WBC: 10
WBC: 10
WBC: 8.4
WBC: 8.9
WBC: 9.3

## 2011-05-23 LAB — BASIC METABOLIC PANEL
BUN: 10
BUN: 10
BUN: 12
BUN: 15
CO2: 27
CO2: 30
CO2: 31
Calcium: 8.5
Calcium: 8.9
Calcium: 9.4
Chloride: 100
Chloride: 97
Chloride: 98
Chloride: 98
Chloride: 99
Creatinine, Ser: 1.04
Creatinine, Ser: 1.06
Creatinine, Ser: 1.06
GFR calc Af Amer: >60
GFR calc Af Amer: >60
GFR calc Af Amer: >60
GFR calc Af Amer: >60
GFR calc non Af Amer: >60
GFR calc non Af Amer: >60
GFR calc non Af Amer: >60
Glucose, Bld: 104 — ABNORMAL HIGH
Glucose, Bld: 106 — ABNORMAL HIGH
Potassium: 3.8
Potassium: 4.1
Potassium: 4.5
Sodium: 133 — ABNORMAL LOW
Sodium: 134 — ABNORMAL LOW

## 2011-05-23 LAB — COMPREHENSIVE METABOLIC PANEL
ALT: 37
AST: 28
Albumin: 4
Alkaline Phosphatase: 63
Chloride: 100
Potassium: 4.3
Sodium: 140
Total Bilirubin: 0.4

## 2011-05-23 LAB — HEMOGLOBIN A1C
Hgb A1c MFr Bld: 5.6
Mean Plasma Glucose: 122

## 2011-05-23 LAB — PROTIME-INR
INR: 0.9
Prothrombin Time: 13.1
Prothrombin Time: 13.2
Prothrombin Time: 13.2

## 2011-05-23 LAB — LIPID PANEL
HDL: 36 — ABNORMAL LOW
Total CHOL/HDL Ratio: 5.3
Triglycerides: 132

## 2011-05-23 LAB — CK TOTAL AND CKMB (NOT AT ARMC)
CK, MB: 1.4
CK, MB: 1.5
CK, MB: 2.8
Relative Index: 1.4
Relative Index: INVALID
Relative Index: INVALID
Relative Index: INVALID
Total CK: 72
Total CK: 74
Total CK: 75
Total CK: 80
Total CK: 81

## 2011-05-23 LAB — TROPONIN I
Troponin I: 0.09 — ABNORMAL HIGH
Troponin I: 0.25 — ABNORMAL HIGH
Troponin I: 0.32 — ABNORMAL HIGH
Troponin I: 0.37 — ABNORMAL HIGH

## 2011-05-23 LAB — DIFFERENTIAL
Basophils Relative: 1
Basophils Relative: 1
Lymphocytes Relative: 17
Lymphocytes Relative: 23
Lymphs Abs: 1.6
Lymphs Abs: 1.9
Monocytes Absolute: 0.6
Monocytes Relative: 10
Monocytes Relative: 5
Monocytes Relative: 8
Neutro Abs: 5.3
Neutro Abs: 6.6
Neutro Abs: 9.2 — ABNORMAL HIGH
Neutrophils Relative %: 63
Neutrophils Relative %: 71

## 2011-05-23 LAB — B-NATRIURETIC PEPTIDE (CONVERTED LAB): Pro B Natriuretic peptide (BNP): 86

## 2011-05-23 LAB — POCT CARDIAC MARKERS
CKMB, poc: <1 — ABNORMAL LOW
Myoglobin, poc: 78
Myoglobin, poc: 79.6
Operator id: 285841
Troponin i, poc: <0.05

## 2011-05-23 LAB — CARDIAC PANEL(CRET KIN+CKTOT+MB+TROPI)
Relative Index: 1.3
Relative Index: 2.2
Total CK: 137
Troponin I: 0.12 — ABNORMAL HIGH

## 2011-05-23 LAB — HEPARIN LEVEL (UNFRACTIONATED)
Heparin Unfractionated: 0.53
Heparin Unfractionated: 0.66

## 2011-05-23 LAB — HOMOCYSTEINE: Homocysteine: 11.7

## 2011-05-23 LAB — APTT: aPTT: 34

## 2011-05-24 LAB — CBC
HCT: 37.5 — ABNORMAL LOW
MCHC: 35.2
MCV: 90.3
MCV: 90.5
Platelets: 147 — ABNORMAL LOW
Platelets: 178
RBC: 4.15 — ABNORMAL LOW
RBC: 4.47
RDW: 13.8
WBC: 5.8
WBC: 7.7

## 2011-05-24 LAB — BASIC METABOLIC PANEL
BUN: 9
CO2: 26
CO2: 27
Calcium: 8.2 — ABNORMAL LOW
Calcium: 9
Chloride: 105
Creatinine, Ser: 0.91
Creatinine, Ser: 1.04
GFR calc Af Amer: >60
GFR calc Af Amer: >60
GFR calc Af Amer: >60
GFR calc non Af Amer: >60
GFR calc non Af Amer: >60
GFR calc non Af Amer: >60
Glucose, Bld: 105 — ABNORMAL HIGH
Potassium: 4.1
Sodium: 137
Sodium: 139

## 2011-05-24 LAB — I-STAT 8, (EC8 V) (CONVERTED LAB)
Chloride: 106
HCT: 39
Hemoglobin: 13.3
Operator id: 151321
Potassium: 4.8
Sodium: 140

## 2011-05-24 LAB — HEPARIN LEVEL (UNFRACTIONATED)
Heparin Unfractionated: 0.6
Heparin Unfractionated: 0.68

## 2011-05-24 LAB — DIFFERENTIAL
Eosinophils Absolute: 0.3
Eosinophils Relative: 5
Lymphocytes Relative: 24
Lymphs Abs: 1.4
Monocytes Relative: 9

## 2011-05-24 LAB — PROTIME-INR
INR: 1
INR: 1
Prothrombin Time: 13.4
Prothrombin Time: 13.5

## 2011-05-24 LAB — COMPREHENSIVE METABOLIC PANEL
CO2: 30
Calcium: 8.8
Creatinine, Ser: 1.09
GFR calc non Af Amer: >60
Glucose, Bld: 77
Total Bilirubin: 0.8

## 2011-05-24 LAB — CK TOTAL AND CKMB (NOT AT ARMC)
CK, MB: 1.6
Relative Index: 1.5
Total CK: 107
Total CK: 109

## 2011-05-24 LAB — CARDIAC PANEL(CRET KIN+CKTOT+MB+TROPI)
Relative Index: INVALID
Total CK: 99
Troponin I: 0.01

## 2011-05-24 LAB — LIPID PANEL
LDL Cholesterol: 47
Total CHOL/HDL Ratio: 3.3
VLDL: 25

## 2011-05-24 LAB — POCT CARDIAC MARKERS: Myoglobin, poc: 59.3

## 2011-05-24 LAB — POCT I-STAT CREATININE: Creatinine, Ser: 1.1

## 2011-05-24 LAB — MAGNESIUM: Magnesium: 2.1

## 2011-05-24 LAB — B-NATRIURETIC PEPTIDE (CONVERTED LAB): Pro B Natriuretic peptide (BNP): 40

## 2011-05-24 LAB — APTT: aPTT: 31

## 2011-05-24 LAB — TSH: TSH: 3.098

## 2011-05-27 LAB — LIPID PANEL
Cholesterol: 118
LDL Cholesterol: 62

## 2011-05-27 LAB — COMPREHENSIVE METABOLIC PANEL
ALT: 28
AST: 23
AST: 24
BUN: 10
CO2: 25
CO2: 27
Calcium: 9.3
Chloride: 103
Chloride: 105
Creatinine, Ser: 1.01
Creatinine, Ser: 1.09
GFR calc Af Amer: >60
GFR calc Af Amer: >60
GFR calc non Af Amer: >60
GFR calc non Af Amer: >60
Glucose, Bld: 190 — ABNORMAL HIGH
Sodium: 138
Total Bilirubin: 0.5
Total Bilirubin: 0.8

## 2011-05-27 LAB — CBC
HCT: 39.3
HCT: 40.2
HCT: 42.6
Hemoglobin: 13.7
Hemoglobin: 13.8
Hemoglobin: 14.8
MCHC: 34.8
MCV: 90.2
MCV: 90.6
Platelets: 174
RBC: 4.38
RBC: 4.38
RBC: 4.72
RDW: 13.9
WBC: 10.2
WBC: 11.4 — ABNORMAL HIGH
WBC: 9.8

## 2011-05-27 LAB — CARDIAC PANEL(CRET KIN+CKTOT+MB+TROPI)
CK, MB: 1.9
Total CK: 100
Total CK: 87

## 2011-05-27 LAB — POCT I-STAT CREATININE
Creatinine, Ser: 1.1
Operator id: 277751

## 2011-05-27 LAB — TROPONIN I
Troponin I: 0.02
Troponin I: 0.02

## 2011-05-27 LAB — I-STAT 8, (EC8 V) (CONVERTED LAB)
Bicarbonate: 26.7 — ABNORMAL HIGH
Chloride: 104
HCT: 40
pCO2, Ven: 49.1
pH, Ven: 7.344 — ABNORMAL HIGH

## 2011-05-27 LAB — CK TOTAL AND CKMB (NOT AT ARMC)
CK, MB: 1.8
Relative Index: 1.6

## 2011-05-27 LAB — BASIC METABOLIC PANEL
CO2: 26
Calcium: 8.8
GFR calc Af Amer: >60
GFR calc non Af Amer: >60
Glucose, Bld: 95
Potassium: 3.7
Sodium: 138

## 2011-05-27 LAB — PROTIME-INR: INR: 1

## 2011-05-27 LAB — APTT: aPTT: 31

## 2011-05-27 LAB — POCT CARDIAC MARKERS
CKMB, poc: <1 — ABNORMAL LOW
Myoglobin, poc: 53.5
Troponin i, poc: <0.05

## 2011-05-27 LAB — LIPASE, BLOOD: Lipase: 20

## 2011-05-27 LAB — DIFFERENTIAL
Basophils Relative: 1
Lymphocytes Relative: 13
Lymphs Abs: 1.3
Monocytes Relative: 8
Neutro Abs: 7.8 — ABNORMAL HIGH
Neutrophils Relative %: 76

## 2011-07-04 DIAGNOSIS — I2510 Atherosclerotic heart disease of native coronary artery without angina pectoris: Secondary | ICD-10-CM | POA: Insufficient documentation

## 2011-07-04 DIAGNOSIS — I679 Cerebrovascular disease, unspecified: Secondary | ICD-10-CM | POA: Insufficient documentation

## 2012-11-05 DIAGNOSIS — I70209 Unspecified atherosclerosis of native arteries of extremities, unspecified extremity: Secondary | ICD-10-CM | POA: Insufficient documentation

## 2012-11-05 DIAGNOSIS — E162 Hypoglycemia, unspecified: Secondary | ICD-10-CM | POA: Insufficient documentation

## 2012-12-23 ENCOUNTER — Encounter: Payer: Self-pay | Admitting: Vascular Surgery

## 2013-01-11 ENCOUNTER — Encounter: Payer: Self-pay | Admitting: Vascular Surgery

## 2013-01-12 ENCOUNTER — Encounter: Payer: Self-pay | Admitting: Vascular Surgery

## 2013-01-12 ENCOUNTER — Other Ambulatory Visit: Payer: Self-pay

## 2013-01-12 ENCOUNTER — Ambulatory Visit (INDEPENDENT_AMBULATORY_CARE_PROVIDER_SITE_OTHER): Payer: BC Managed Care – PPO | Admitting: Vascular Surgery

## 2013-01-12 VITALS — BP 116/77 | HR 59 | Resp 16 | Ht 70.0 in | Wt 201.0 lb

## 2013-01-12 DIAGNOSIS — I70219 Atherosclerosis of native arteries of extremities with intermittent claudication, unspecified extremity: Secondary | ICD-10-CM

## 2013-01-12 DIAGNOSIS — M79609 Pain in unspecified limb: Secondary | ICD-10-CM

## 2013-01-12 DIAGNOSIS — R200 Anesthesia of skin: Secondary | ICD-10-CM

## 2013-01-12 DIAGNOSIS — R202 Paresthesia of skin: Secondary | ICD-10-CM | POA: Insufficient documentation

## 2013-01-12 DIAGNOSIS — I739 Peripheral vascular disease, unspecified: Secondary | ICD-10-CM | POA: Insufficient documentation

## 2013-01-12 DIAGNOSIS — R209 Unspecified disturbances of skin sensation: Secondary | ICD-10-CM

## 2013-01-12 NOTE — Progress Notes (Signed)
Subjective:     Patient ID: Justin Wells, male   DOB: [birth year], 62 y.o.   MRN: [id number]  HPI this 62 year old male was referred by Dr. Earlene Plater in [place] for evaluation of bilateral claudication right worse than left. Patient has had calf discomfort for the last 2 years which has worsened significantly. He is now unable to walk one half block without having severe discomfort in both calves. He also has hip discomfort which sounds more mechanical and often occurs when he first arises. He has no history of nonhealing ulcers infection or gangrene. He does have some numbness in both feet right worse than left. He smoked 1-1/2-2 packs of cigarettes per day for 30+ years but stopped smoking in 2009 when he had a myocardial infarction and 2 stents placed.  Past Medical History  Diagnosis Date  . Peripheral arterial disease   . Hyperlipidemia   . Coronary artery disease   . Hypertension   . Barrett's esophagus   . Gastroesophageal reflux   . Cerebrovascular disease   . Stroke 2009  2010  . Myocardial infarction 2009    History  Substance Use Topics  . Smoking status: Former Smoker    Quit date: 01/28/2001  . Smokeless tobacco: Never Used  . Alcohol Use: No    Family History  Problem Relation Age of Onset  . Heart disease Mother     Heart Disease before age 69  . Hypertension Mother   . Heart attack Mother   . Heart attack Father     Not on File  Current outpatient prescriptions:aspirin 325 MG EC tablet, Take 325 mg by mouth daily., Disp: , Rfl: ;  Cyanocobalamin (VITAMIN B 12 PO), Take 500 mg by mouth daily., Disp: , Rfl: ;  fish oil-omega-3 fatty acids 1000 MG capsule, Take 1 g by mouth daily., Disp: , Rfl: ;  lisinopril (PRINIVIL,ZESTRIL) 5 MG tablet, Take 5 mg by mouth daily., Disp: , Rfl: ;  Multiple Vitamins-Minerals (ONE-A-DAY MENS VITACRAVES PO), Take by mouth daily., Disp: , Rfl:  nitroGLYCERIN (NITROSTAT) 0.4 MG SL tablet, Place 0.4 mg under the tongue every 5 (five)  minutes as needed for chest pain., Disp: , Rfl: ;  omeprazole (PRILOSEC) 20 MG capsule, Take 20 mg by mouth daily., Disp: , Rfl: ;  oxyCODONE-acetaminophen (PERCOCET/ROXICET) 5-325 MG per tablet, Take 1 tablet by mouth every 8 (eight) hours as needed for pain., Disp: , Rfl: ;  PARoxetine (PAXIL) 20 MG tablet, Take 20 mg by mouth every morning., Disp: , Rfl:   BP 116/77  Pulse 59  Resp 16  Ht 5\' 10"  (1.778 m)  Wt 201 lb (91.173 kg)  BMI 28.84 kg/m2  SpO2 97%  Body mass index is 28.84 kg/(m^2).             Review of Systems denies chest pain, dyspnea on exertion. He does have a history of burning and numbness in the feet, numbness worse in the right foot. Had a transient ischemic attack while having PTCA performed in 2009 with some transient left facial numbness and left sided weakness which resolved.     Objective:   Physical Exam blood pressure 116/77 heart rate 59 respirations 16 Gen.-alert and oriented x3 in no apparent distress HEENT normal for age Lungs no rhonchi or wheezing Cardiovascular regular rhythm no murmurs carotid pulses 3+ palpable no bruits audible Abdomen soft nontender no palpable masses Musculoskeletal free of  major deformities Skin clear -no rashes Neurologic normal Lower extremities 2+ femoral  pulses palpable bilaterally. No popliteal or distal pulses palpable bilaterally. No active infection cellulitis gangrene or other ulceration noted. No distal edema or varicosities noted.  Today I reviewed the arterial duplex study performed in [place]. ABI on the right is 0.50 on the left is 0.67 due to superficial femoral and/or popliteal occlusive disease      Assessment:     Bilateral lower extremity claudication right worse than left due to femoral popliteal occlusive disease-possibly some degree of iliac occlusive disease History of tobacco abuse 50+ years-quit smoking in 2009 History of coronary artery disease with a myocardial infarction 2009 with 2  stents placed    Plan:     Plan aortobifemoral angiography with possible PTA and stenting of right iliac or SFA if feasible per Dr. Myra Gianotti on Tuesday, May 27

## 2013-01-25 MED ORDER — SODIUM CHLORIDE 0.9 % IV SOLN
INTRAVENOUS | Status: DC
  Administered 2013-01-26: 09:00:00 via INTRAVENOUS

## 2013-01-26 ENCOUNTER — Encounter (HOSPITAL_COMMUNITY)
Admission: RE | Disposition: A | Discharge status: Home / Self Care | Payer: Self-pay | Source: Ambulatory Visit | Attending: Surgery

## 2013-01-26 ENCOUNTER — Telehealth: Payer: Self-pay | Admitting: Vascular Surgery

## 2013-01-26 ENCOUNTER — Observation Stay (HOSPITAL_COMMUNITY)
Admission: RE | Admit: 2013-01-26 | Discharge: 2013-01-27 | Disposition: A | Discharge status: Home / Self Care | Payer: BC Managed Care – PPO | Source: Ambulatory Visit | Attending: Surgery | Admitting: Surgery

## 2013-01-26 DIAGNOSIS — Z79899 Other long term (current) drug therapy: Secondary | ICD-10-CM | POA: Insufficient documentation

## 2013-01-26 DIAGNOSIS — I739 Peripheral vascular disease, unspecified: Secondary | ICD-10-CM

## 2013-01-26 DIAGNOSIS — I70219 Atherosclerosis of native arteries of extremities with intermittent claudication, unspecified extremity: Principal | ICD-10-CM | POA: Insufficient documentation

## 2013-01-26 DIAGNOSIS — I10 Essential (primary) hypertension: Secondary | ICD-10-CM | POA: Insufficient documentation

## 2013-01-26 HISTORY — DX: Personal history of other diseases of the digestive system: Z87.19

## 2013-01-26 HISTORY — DX: Headache: R51

## 2013-01-26 HISTORY — PX: ABDOMINAL AORTAGRAM: SHX5454

## 2013-01-26 HISTORY — DX: Unspecified osteoarthritis, unspecified site: M19.90

## 2013-01-26 HISTORY — DX: Unspecified convulsions: R56.9

## 2013-01-26 HISTORY — PX: FEMORAL ARTERY STENT: SHX1583

## 2013-01-26 LAB — CBC
HCT: 38.7 % — ABNORMAL LOW (ref 39.0–52.0)
Hemoglobin: 13.6 g/dL (ref 13.0–17.0)
RDW: 14.2 % (ref 11.5–15.5)
WBC: 10.2 10*3/uL (ref 4.0–10.5)

## 2013-01-26 LAB — POCT I-STAT, CHEM 8
BUN: 11 mg/dL (ref 6–23)
Creatinine, Ser: 1 mg/dL (ref 0.50–1.35)
Glucose, Bld: 113 mg/dL — ABNORMAL HIGH (ref 70–99)
Sodium: 139 mEq/L (ref 135–145)
TCO2: 25 mmol/L (ref 0–100)

## 2013-01-26 SURGERY — ABDOMINAL AORTAGRAM
Anesthesia: LOCAL | Laterality: Right

## 2013-01-26 MED ORDER — ACETAMINOPHEN 325 MG RE SUPP
325.0000 mg | RECTAL | Status: DC | PRN
  Filled 2013-01-26: qty 2

## 2013-01-26 MED ORDER — MIDAZOLAM HCL 2 MG/2ML IJ SOLN
INTRAMUSCULAR | Status: AC
  Filled 2013-01-26: qty 2

## 2013-01-26 MED ORDER — SODIUM CHLORIDE 0.9 % IJ SOLN
3.0000 mL | Freq: Two times a day (BID) | INTRAMUSCULAR | Status: DC
  Administered 2013-01-26: 3 mL via INTRAVENOUS

## 2013-01-26 MED ORDER — SODIUM CHLORIDE 0.9 % IV SOLN: 250.0000 mL | INTRAVENOUS | Status: DC | PRN

## 2013-01-26 MED ORDER — MORPHINE SULFATE 4 MG/ML IJ SOLN
INTRAMUSCULAR | Status: AC
  Administered 2013-01-26: 2.5 mg
  Filled 2013-01-26: qty 1

## 2013-01-26 MED ORDER — HEPARIN (PORCINE) IN NACL 2-0.9 UNIT/ML-% IJ SOLN
INTRAMUSCULAR | Status: AC
  Filled 2013-01-26: qty 1000

## 2013-01-26 MED ORDER — PHENOL 1.4 % MT LIQD
1.0000 | OROMUCOSAL | Status: DC | PRN
  Filled 2013-01-26: qty 177

## 2013-01-26 MED ORDER — HYDRALAZINE HCL 20 MG/ML IJ SOLN: 10.0000 mg | INTRAMUSCULAR | Status: DC | PRN

## 2013-01-26 MED ORDER — ONDANSETRON HCL 4 MG/2ML IJ SOLN: 4.0000 mg | Freq: Four times a day (QID) | INTRAMUSCULAR | Status: DC | PRN

## 2013-01-26 MED ORDER — VERAPAMIL HCL 2.5 MG/ML IV SOLN
INTRAVENOUS | Status: AC
  Filled 2013-01-26: qty 2

## 2013-01-26 MED ORDER — NITROGLYCERIN 0.2 MG/ML ON CALL CATH LAB
INTRAVENOUS | Status: AC
  Filled 2013-01-26: qty 1

## 2013-01-26 MED ORDER — METOPROLOL TARTRATE 1 MG/ML IV SOLN: 2.0000 mg | INTRAVENOUS | Status: DC | PRN

## 2013-01-26 MED ORDER — ACETAMINOPHEN 325 MG PO TABS: 325.0000 mg | ORAL_TABLET | ORAL | Status: DC | PRN

## 2013-01-26 MED ORDER — LISINOPRIL 5 MG PO TABS
5.0000 mg | ORAL_TABLET | Freq: Every day | ORAL | Status: DC
  Filled 2013-01-26: qty 1

## 2013-01-26 MED ORDER — MORPHINE SULFATE 2 MG/ML IJ SOLN
2.0000 mg | INTRAMUSCULAR | Status: DC | PRN
  Filled 2013-01-26: qty 1

## 2013-01-26 MED ORDER — MORPHINE SULFATE 2 MG/ML IJ SOLN
INTRAMUSCULAR | Status: AC
  Administered 2013-01-27: 2 mg via INTRAVENOUS
  Filled 2013-01-26: qty 1

## 2013-01-26 MED ORDER — FENTANYL CITRATE 0.05 MG/ML IJ SOLN
INTRAMUSCULAR | Status: AC
  Filled 2013-01-26: qty 2

## 2013-01-26 MED ORDER — SODIUM CHLORIDE 0.9 % IV SOLN
1.0000 mL/kg/h | INTRAVENOUS | Status: DC
  Administered 2013-01-26: 1 mL/kg/h via INTRAVENOUS

## 2013-01-26 MED ORDER — MORPHINE SULFATE 10 MG/ML IJ SOLN
2.0000 mg | INTRAMUSCULAR | Status: DC | PRN
  Administered 2013-01-26 (×3): 2 mg via INTRAVENOUS

## 2013-01-26 MED ORDER — SODIUM CHLORIDE 0.9 % IJ SOLN: 3.0000 mL | INTRAMUSCULAR | Status: DC | PRN

## 2013-01-26 MED ORDER — ALUM & MAG HYDROXIDE-SIMETH 200-200-20 MG/5ML PO SUSP: 15.0000 mL | ORAL | Status: DC | PRN

## 2013-01-26 MED ORDER — GUAIFENESIN-DM 100-10 MG/5ML PO SYRP: 15.0000 mL | ORAL_SOLUTION | ORAL | Status: DC | PRN

## 2013-01-26 MED ORDER — OXYCODONE-ACETAMINOPHEN 5-325 MG PO TABS
ORAL_TABLET | ORAL | Status: AC
  Filled 2013-01-26: qty 2

## 2013-01-26 MED ORDER — OXYCODONE-ACETAMINOPHEN 5-325 MG PO TABS
1.0000 | ORAL_TABLET | ORAL | Status: DC | PRN
  Administered 2013-01-26 – 2013-01-27 (×2): 2 via ORAL
  Filled 2013-01-26: qty 1
  Filled 2013-01-26: qty 2

## 2013-01-26 MED ORDER — MORPHINE SULFATE 2 MG/ML IJ SOLN
INTRAMUSCULAR | Status: AC
  Filled 2013-01-26: qty 1

## 2013-01-26 MED ORDER — PAROXETINE HCL 20 MG PO TABS
20.0000 mg | ORAL_TABLET | ORAL | Status: DC
Start: 2013-01-27 — End: 2013-01-27
  Filled 2013-01-26 (×2): qty 1

## 2013-01-26 MED ORDER — CLOPIDOGREL BISULFATE 75 MG PO TABS
75.0000 mg | ORAL_TABLET | Freq: Every day | ORAL | Status: DC
  Filled 2013-01-26 (×2): qty 1

## 2013-01-26 MED ORDER — HEPARIN SODIUM (PORCINE) 1000 UNIT/ML IJ SOLN
INTRAMUSCULAR | Status: AC
  Filled 2013-01-26: qty 1

## 2013-01-26 MED ORDER — MORPHINE SULFATE 2 MG/ML IJ SOLN
INTRAMUSCULAR | Status: AC
  Administered 2013-01-26: 2 mg via INTRAVENOUS
  Filled 2013-01-26: qty 1

## 2013-01-26 MED ORDER — POTASSIUM CHLORIDE CRYS ER 20 MEQ PO TBCR
20.0000 meq | EXTENDED_RELEASE_TABLET | Freq: Once | ORAL | Status: AC
  Administered 2013-01-26: 20 meq via ORAL
  Filled 2013-01-26: qty 2

## 2013-01-26 MED ORDER — NITROGLYCERIN 0.4 MG SL SUBL: 0.4000 mg | SUBLINGUAL_TABLET | SUBLINGUAL | Status: DC | PRN

## 2013-01-26 MED ORDER — LIDOCAINE HCL (PF) 1 % IJ SOLN
INTRAMUSCULAR | Status: AC
  Filled 2013-01-26: qty 30

## 2013-01-26 MED ORDER — LABETALOL HCL 5 MG/ML IV SOLN
10.0000 mg | INTRAVENOUS | Status: DC | PRN
  Filled 2013-01-26: qty 4

## 2013-01-26 NOTE — Op Note (Signed)
[facility]  Patient name: Justin Wells MRN: [id number] DOB: 1951/06/24 Sex: male  01/26/2013 Pre-operative Diagnosis: Bilateral claudication, right greater than left Post-operative diagnosis:  Same Surgeon:  Jorge Ny Procedure Performed:  1.  ultrasound-guided access, left femoral artery  2.  abdominal aortogram  3.  right lower extremity runoff  4.  atherectomy, right superficial femoral artery  5.  stent, right superficial femoral artery    Indications:  The patient suffers from lifestyle limiting claudication. He comes in today for further evaluation and possible intervention  Procedure:  The patient was identified in the holding area and taken to room 8.  The patient was then placed supine on the table and prepped and draped in the usual sterile fashion.  A time out was called.  Ultrasound was used to evaluate the left common femoral artery.  It was patent .  A digital ultrasound image was acquired.  A micropuncture needle was used to access the left common femoral artery under ultrasound guidance.  An 018 wire was advanced without resistance and a micropuncture sheath was placed.  The 018 wire was removed and a benson wire was placed.  The micropuncture sheath was exchanged for a 5 french sheath.  An omniflush catheter was advanced over the wire to the level of L-1.  An abdominal angiogram was obtained.  Next, using the omniflush catheter and a benson wire, the aortic bifurcation was crossed and the catheter was placed into theright external iliac artery and right runoff was obtained.   Findings:   Aortogram:  The visualized portions of the suprarenal abdominal aorta showed no significant stenosis. There are single renal arteries bilaterally which are widely patent without stenosis. The infrarenal abdominal aorta is heavily calcified but no significant stenosis identified. Bilateral common, external, and internal iliac arteries are widely patent  without significant stenosis.  Right Lower Extremity:  The right common femoral artery and profunda femoral artery are widely patent. The right superficial femoral artery is diffusely calcified with mild luminal narrowing down to the adductor canal where there is a 90% stenosis. This area is heavily calcified. The popliteal artery is patent throughout it's course. Runoff evaluation is somewhat limited due to proximal disease. The posterior tibial appears to be the dominant vessel.   Intervention:  After the above images, the decision was made to proceed with intervention. Over an 035 wire, a 7 [place] Ansel 1 sheath was advanced into the right common femoral artery. The patient was fully heparinized. Using a quick cross catheter and a Glidewire were used to cross the lesion and the adductor canal. A 014 wire was then placed. I elected to proceed with atherectomy of the distal superficial femoral artery. This was done with the CSI 1.5 device. Passes were made at a slow medium and fast revolutions.. I then performed balloon angioplasty of the treated area. Followup arteriogram revealed residual dissection and stenosis. I elected to then stent this area. I selected a Cordis 7 x 1 50 self-expanding stent. This was molded to confirmation. Completion arteriogram revealed resolution of the stenosis. There was residual disease within the proximal superficial femoral artery, however I did not feel this warranted intervention at this time. Imaging of the left leg was not performed to contrast administration. Catheters and wires were removed the sheath was taken to the left external iliac artery. There were no complications.  Impression:  #1  no significant aortoiliac occlusive disease.  #2  successful atherectomy and stenting of  the right superficial femoral artery using a 1.5 CSI device and 7 x 1 50 Cordis self-expanding stent  #3  evaluation of the left leg was not obtained due to the amount of contrast utilize for  the intervention. The patient suffers from problems of his left leg he will need to be brought back for an intervention on the left side.   Juleen China, M.D. [facility] Office: [phone] Pager:  [phone]

## 2013-01-26 NOTE — Progress Notes (Signed)
L groin evaluated.  No hematoma.  Groin soft   Wells Brabham

## 2013-01-26 NOTE — Progress Notes (Signed)
AFTER TRANSFERRED FROM STRETCHER TO BED AND LEFT GROIN TOUCHED, C/O LEFT GROIN PAIN AND ABOUT 5X7 CM AREA OF FIRMNESS NOTED LEFT GROIN; ABBIE FROM CATH LAB IN AND PRESSURE HELD LEFT GROIN FOR AND FEELS SOFTER

## 2013-01-26 NOTE — Progress Notes (Signed)
DR Myra Gianotti IN TO SEE CLIENT AND REPORT CALLED TO NURSE FOR 2034 AND TRANSFERRED VIA BED

## 2013-01-26 NOTE — Progress Notes (Signed)
Patient received from short stay. Assessed per flow sheet. Denies any pain or distress at this time. VSS. Left groin intact with dressing dry/intact. No bleeding or hematoma noted. Post activity and bleeding precautions explained. Patient verbalized understanding. Patient instructed to remain in bed until Call bell near. Justin Wells

## 2013-01-26 NOTE — Interval H&P Note (Signed)
History and Physical Interval Note:  01/26/2013 8:38 AM  Justin Wells  has presented today for surgery, with the diagnosis of pvd  The various methods of treatment have been discussed with the patient and family. After consideration of risks, benefits and other options for treatment, the patient has consented to  Procedure(s): ABDOMINAL AORTAGRAM (N/A) as a surgical intervention .  The patient's history has been reviewed, patient examined, no change in status, stable for surgery.  I have reviewed the patient's chart and labs.  Questions were answered to the patient's satisfaction.     Yaremi Stahlman IV, [name]

## 2013-01-26 NOTE — Telephone Encounter (Addendum)
Message copied by Rosalyn Charters on Tue Jan 26, 2013  1:21 PM ------      Message from: Melene Plan      Created: Tue Jan 26, 2013 11:16 AM                   ----- Message -----         From: Nada Libman, MD         Sent: 01/26/2013  11:02 AM           To: Reuel Derby, Melene Plan, RN            01/26/2013:            Surgeon:  Jorge Ny      Procedure Performed:       1.  ultrasound-guided access, left femoral artery       2.  abdominal aortogram       3.  right lower extremity runoff       4.  atherectomy, right superficial femoral artery       5.  stent, right superficial femoral artery                   Please schedule the patient to followup with Dr. Hart Rochester in one month ------   notified patient of fu appt. on 02-23-13 at 3:15

## 2013-01-26 NOTE — Progress Notes (Signed)
LEFT GROIN FIRM AGAIN AND PAINFUL AND ABBIE,RN IN AND HELD PRESSURE AGAIN FOR PAIN INCREASED TO 8/10 LEFT GROIN AND LEFT GROIN SOFTER AFTER PRESSURE HELD AND PAIN DOWN TO 3/10; C/O 4/10 HEADACHE

## 2013-01-26 NOTE — H&P (View-Only) (Signed)
Subjective:     Patient ID: Justin Wells, male   DOB: 04/03/1951, 62 y.o.   MRN: [id number]  HPI this 62-year-old male was referred by Dr. Davis in [place] for evaluation of bilateral claudication right worse than left. Patient has had calf discomfort for the last 2 years which has worsened significantly. He is now unable to walk one half block without having severe discomfort in both calves. He also has hip discomfort which sounds more mechanical and often occurs when he first arises. He has no history of nonhealing ulcers infection or gangrene. He does have some numbness in both feet right worse than left. He smoked 1-1/2-2 packs of cigarettes per day for 30+ years but stopped smoking in 2009 when he had a myocardial infarction and 2 stents placed.  Past Medical History  Diagnosis Date  . Peripheral arterial disease   . Hyperlipidemia   . Coronary artery disease   . Hypertension   . Barrett's esophagus   . Gastroesophageal reflux   . Cerebrovascular disease   . Stroke 2009  2010  . Myocardial infarction 2009    History  Substance Use Topics  . Smoking status: Former Smoker    Quit date: 01/28/2001  . Smokeless tobacco: Never Used  . Alcohol Use: No    Family History  Problem Relation Age of Onset  . Heart disease Mother     Heart Disease before age 60  . Hypertension Mother   . Heart attack Mother   . Heart attack Father     Not on File  Current outpatient prescriptions:aspirin 325 MG EC tablet, Take 325 mg by mouth daily., Disp: , Rfl: ;  Cyanocobalamin (VITAMIN B 12 PO), Take 500 mg by mouth daily., Disp: , Rfl: ;  fish oil-omega-3 fatty acids 1000 MG capsule, Take 1 g by mouth daily., Disp: , Rfl: ;  lisinopril (PRINIVIL,ZESTRIL) 5 MG tablet, Take 5 mg by mouth daily., Disp: , Rfl: ;  Multiple Vitamins-Minerals (ONE-A-DAY MENS VITACRAVES PO), Take by mouth daily., Disp: , Rfl:  nitroGLYCERIN (NITROSTAT) 0.4 MG SL tablet, Place 0.4 mg under the tongue every 5 (five)  minutes as needed for chest pain., Disp: , Rfl: ;  omeprazole (PRILOSEC) 20 MG capsule, Take 20 mg by mouth daily., Disp: , Rfl: ;  oxyCODONE-acetaminophen (PERCOCET/ROXICET) 5-325 MG per tablet, Take 1 tablet by mouth every 8 (eight) hours as needed for pain., Disp: , Rfl: ;  PARoxetine (PAXIL) 20 MG tablet, Take 20 mg by mouth every morning., Disp: , Rfl:   BP 116/77  Pulse 59  Resp 16  Ht 5' 10" (1.778 m)  Wt 201 lb (91.173 kg)  BMI 28.84 kg/m2  SpO2 97%  Body mass index is 28.84 kg/(m^2).             Review of Systems denies chest pain, dyspnea on exertion. He does have a history of burning and numbness in the feet, numbness worse in the right foot. Had a transient ischemic attack while having PTCA performed in 2009 with some transient left facial numbness and left sided weakness which resolved.     Objective:   Physical Exam blood pressure 116/77 heart rate 59 respirations 16 Gen.-alert and oriented x3 in no apparent distress HEENT normal for age Lungs no rhonchi or wheezing Cardiovascular regular rhythm no murmurs carotid pulses 3+ palpable no bruits audible Abdomen soft nontender no palpable masses Musculoskeletal free of  major deformities Skin clear -no rashes Neurologic normal Lower extremities 2+ femoral   pulses palpable bilaterally. No popliteal or distal pulses palpable bilaterally. No active infection cellulitis gangrene or other ulceration noted. No distal edema or varicosities noted.  Today I reviewed the arterial duplex study performed in [place]. ABI on the right is 0.50 on the left is 0.67 due to superficial femoral and/or popliteal occlusive disease      Assessment:     Bilateral lower extremity claudication right worse than left due to femoral popliteal occlusive disease-possibly some degree of iliac occlusive disease History of tobacco abuse 50+ years-quit smoking in 2009 History of coronary artery disease with a myocardial infarction 2009 with 2  stents placed    Plan:     Plan aortobifemoral angiography with possible PTA and stenting of right iliac or SFA if feasible per Dr. Brabham on Tuesday, May 27      

## 2013-01-26 NOTE — Progress Notes (Signed)
DR Myra Gianotti NOTIFIED OF FIRM GROIN AND NO NEW ORDERS

## 2013-01-27 ENCOUNTER — Encounter (HOSPITAL_COMMUNITY): Payer: Self-pay | Admitting: General Practice

## 2013-01-27 DIAGNOSIS — I70219 Atherosclerosis of native arteries of extremities with intermittent claudication, unspecified extremity: Secondary | ICD-10-CM

## 2013-01-27 MED ORDER — OXYCODONE-ACETAMINOPHEN 5-325 MG PO TABS: 1.0000 | ORAL_TABLET | ORAL | Status: DC | PRN

## 2013-01-27 NOTE — Progress Notes (Signed)
[facility]  Subjective  - He complains of discomfort in the left groin area, but states it does feel better this am than yesterday.   Objective 107/67 80 98.3 F (36.8 C) (Oral) 16 97%  Intake/Output Summary (Last 24 hours) at 01/27/13 0733 Last data filed at 01/27/13 0300  Gross per 24 hour  Intake      0 ml  Output    850 ml  Net   -850 ml    Left groin soft at incision site.  No active bleeding. Lower extremities warm  Assessment/Planning: S/P 1. ultrasound-guided access, left femoral artery  2. abdominal aortogram  3. right lower extremity runoff  4. atherectomy, right superficial femoral artery  5. stent, right superficial femoral artery    D/C home follow up with Dr. Myra Gianotti in the office.  Clinton Gallant [facility] 01/27/2013 7:33 AM --  Laboratory Lab Results:  Recent Labs  01/26/13 0836 01/26/13 1926  WBC  --  10.2  HGB 14.3 13.6  HCT 42.0 38.7*  PLT  --  178   BMET  Recent Labs  01/26/13 0836  NA 139  K 3.9  CL 103  GLUCOSE 113*  BUN 11  CREATININE 1.00    COAG Lab Results  Component Value Date   INR 1.0 11/03/2007   INR 1.0 10/13/2007   INR 1.0 10/13/2007   No results found for this basename: PTT

## 2013-01-27 NOTE — Discharge Summary (Signed)
Physician Discharge Summary  Patient ID: Justin Wells MRN: [id number] DOB/AGE: October 14, 1950 62 y.o.  Admit date: 01/26/2013 Discharge date: 01/27/2013  Admission Diagnosis: Bilateral claudication, right greater than left   Discharge Diagnoses:  Bilateral claudication, right greater than left     Procedures: 1. ultrasound-guided access, left femoral artery  2. abdominal aortogram  3. right lower extremity runoff  4. atherectomy, right superficial femoral artery  5. stent, right superficial femoral artery   Discharged Condition: good  Hospital Course: Patient had left groin hematoma post angiogram.  He was observed over night.  Hematoma resolved with bed rest.  This morning the left groin is soft and feet are warm to touch.  Incision is clean and dry.  Consults:   NA  Significant Diagnostic Studies: CBC    Component Value Date/Time   WBC 10.2 01/26/2013 1926   RBC 4.46 01/26/2013 1926   HGB 13.6 01/26/2013 1926   HCT 38.7* 01/26/2013 1926   PLT 178 01/26/2013 1926   MCV 86.8 01/26/2013 1926   MCH 30.5 01/26/2013 1926   MCHC 35.1 01/26/2013 1926   RDW 14.2 01/26/2013 1926   LYMPHSABS 1.3 11/03/2007 0050   MONOABS 0.8 11/03/2007 0050   EOSABS 0.2 11/03/2007 0050   BASOSABS 0.1 11/03/2007 0050    BMET    Component Value Date/Time   NA 139 01/26/2013 0836   K 3.9 01/26/2013 0836   CL 103 01/26/2013 0836   CO2 25 11/05/2007 0615   GLUCOSE 113* 01/26/2013 0836   BUN 11 01/26/2013 0836   CREATININE 1.00 01/26/2013 0836   CALCIUM 9.3 11/05/2007 0615   GFRNONAA >60 11/05/2007 0615   GFRAA  Value: >60        The eGFR has been calculated using the MDRD equation. This calculation has not been validated in all clinical 11/05/2007 0615    COAG Lab Results  Component Value Date   INR 1.0 11/03/2007   INR 1.0 10/13/2007   INR 1.0 10/13/2007   No results found for this basename: PTT    Disposition:   Discharge Orders   Future Appointments Provider Department Dept Phone   02/23/2013 3:15 PM  Pryor Ochoa, MD [facility] [phone]   Future Orders Complete By Expires     Call MD for:  redness, tenderness, or signs of infection (pain, swelling, bleeding, redness, odor or green/yellow discharge around incision site)  As directed     Call MD for:  severe or increased pain, loss or decreased feeling  in affected limb(s)  As directed     Call MD for:  temperature >100.5  As directed     Driving Restrictions  As directed     Comments:      No driving for 24 hours    Increase activity slowly  As directed     Comments:      Walk with assistance use walker or cane as needed    Lifting restrictions  As directed     Comments:      No lifting for 6 weeks    May shower   As directed     Resume previous diet  As directed         Medication List    TAKE these medications       aspirin 325 MG EC tablet  Take 325 mg by mouth daily.     fish oil-omega-3 fatty acids 1000 MG capsule  Take 1 g by mouth daily.  lisinopril 5 MG tablet  Commonly known as:  PRINIVIL,ZESTRIL  Take 5 mg by mouth daily.     nitroGLYCERIN 0.4 MG SL tablet  Commonly known as:  NITROSTAT  Place 0.4 mg under the tongue every 5 (five) minutes as needed for chest pain.     ONE-A-DAY MENS VITACRAVES PO  Take by mouth daily.     oxyCODONE-acetaminophen 5-325 MG per tablet  Commonly known as:  PERCOCET/ROXICET  Take 1 tablet by mouth every 8 (eight) hours as needed for pain.     oxyCODONE-acetaminophen 5-325 MG per tablet  Commonly known as:  PERCOCET/ROXICET  Take 1-2 tablets by mouth every 4 (four) hours as needed for pain.     PARoxetine 20 MG tablet  Commonly known as:  PAXIL  Take 20 mg by mouth every morning.     ranitidine 150 MG tablet  Commonly known as:  ZANTAC  Take 150 mg by mouth 2 (two) times daily.     VITAMIN B 12 PO  Take 500 mg by mouth daily.           Follow-up Information   Follow up with Josephina Gip, MD In 4 weeks. (sent)    Contact  information:   [address] [place] [address] [phone]       Signed: Clinton Gallant [facility] 01/27/2013, 7:47 AM

## 2013-01-27 NOTE — Progress Notes (Signed)
Utilization Review Completed.Justin Wells T5/28/2014  

## 2013-01-29 ENCOUNTER — Encounter: Payer: Self-pay | Admitting: Vascular Surgery

## 2013-02-03 ENCOUNTER — Telehealth: Payer: Self-pay

## 2013-02-03 DIAGNOSIS — R1909 Other intra-abdominal and pelvic swelling, mass and lump: Secondary | ICD-10-CM

## 2013-02-03 NOTE — Telephone Encounter (Signed)
Pt's. wife called to report a knot in left groin, above the catheter insertion site; pt. noticed this on Sunday night.  Reports that he had been kept in the hospital overnight after the angio, for bleeding.  States pt. c/o continued discomfort in left groin, and left lower abdominal area.  Reports that "knot is firm and very sore".   States that the left groin is puffy, but the knot is not raised; only noticeable by palpating.  Also, states pt. ret'd to work on Monday, and has been avoiding any heavy-lifting.  Discussed w/ Dr. Arbie Cookey.  Recommends to bring pt. in to clinic, tomorrow, for an ultrasound of left groin to r/o pseudoaneurysm.

## 2013-02-04 ENCOUNTER — Encounter: Payer: Self-pay | Admitting: Vascular Surgery

## 2013-02-04 ENCOUNTER — Encounter (INDEPENDENT_AMBULATORY_CARE_PROVIDER_SITE_OTHER): Payer: BC Managed Care – PPO | Admitting: *Deleted

## 2013-02-04 DIAGNOSIS — R1900 Intra-abdominal and pelvic swelling, mass and lump, unspecified site: Secondary | ICD-10-CM

## 2013-02-04 DIAGNOSIS — Z48812 Encounter for surgical aftercare following surgery on the circulatory system: Secondary | ICD-10-CM

## 2013-02-04 NOTE — Discharge Summary (Signed)
I agree with the above  Wells Robertson [place] 

## 2013-02-04 NOTE — Progress Notes (Signed)
Patient ID: Justin Wells, male   DOB: 1950/12/10, 62 y.o.   MRN: [id number]  Patient is status post recent arteriogram via left groin puncture and right superficial femoral artery stenting by Dr. Myra Gianotti. He presented to the office today complaining of swelling and pain in his left groin. Duplex ultrasound was performed which showed no evidence of AV fistula. There was a hematoma present.  On exam he has some ecchymosis in the left groin area and in the suprapubic region. This is tender on palpation but no focal area of hematoma.  The patient was reassured and has scheduled followup with Dr. Myra Gianotti in a couple of weeks.  Fabienne Bruns, MD [facility] [place] Office: [phone] Pager: [phone]

## 2013-02-22 ENCOUNTER — Encounter: Payer: Self-pay | Admitting: Vascular Surgery

## 2013-02-23 ENCOUNTER — Ambulatory Visit (INDEPENDENT_AMBULATORY_CARE_PROVIDER_SITE_OTHER): Payer: BC Managed Care – PPO | Admitting: Vascular Surgery

## 2013-02-23 ENCOUNTER — Encounter: Payer: Self-pay | Admitting: Vascular Surgery

## 2013-02-23 ENCOUNTER — Encounter (HOSPITAL_COMMUNITY): Payer: Self-pay | Admitting: Pharmacy Technician

## 2013-02-23 VITALS — BP 120/72 | HR 62 | Ht 70.0 in | Wt 202.0 lb

## 2013-02-23 DIAGNOSIS — I70219 Atherosclerosis of native arteries of extremities with intermittent claudication, unspecified extremity: Secondary | ICD-10-CM

## 2013-02-23 NOTE — Progress Notes (Signed)
Subjective:     Patient ID: Justin Wells, male   DOB: [birth year], 62 y.o.   MRN: [id number]  HPI 62 year old male returns for followup regarding his bilateral superficial femoral occlusive disease. He underwent PTA and stenting of his right superficial femoral artery by Dr. Myra Gianotti on June 3. He has had an excellent result with complete resolution of claudication symptoms in the right leg. He continues to be limited by his left leg. Left leg was not steady at that time because of contrast constraints.   Past Medical History  Diagnosis Date  . Peripheral arterial disease   . Hyperlipidemia   . Coronary artery disease   . Hypertension   . Barrett's esophagus   . Gastroesophageal reflux   . Cerebrovascular disease   . Myocardial infarction 2009    "during cardiac cath" (01/27/2013)  . H/O hiatal hernia   . WUJWJXBJ(478.2)     "probably monthly" (01/27/2013)  . Seizures     "when I had my heart attack in 2009" (01/27/2013)  . 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 Arthritis     "hands" (01/27/2013)    History  Substance Use Topics  . Smoking status: Former Smoker -- 2.00 packs/day for 35 years    Types: Cigarettes    Quit date: 01/28/2001  . Smokeless tobacco: Never Used  . Alcohol Use: No     Comment: 01/27/2013 "quit drinking in 2001; I'm a recovering alcoholic"    Family History  Problem Relation Age of Onset  . Heart disease Mother     Heart Disease before age 74  . Hypertension Mother   . Heart attack Mother   . Heart attack Father     No Known Allergies  Current outpatient prescriptions:clopidogrel (PLAVIX) 75 MG tablet, Take 1 tablet by mouth daily., Disp: , Rfl: ;  Cyanocobalamin (VITAMIN B 12 PO), Take 500 mg by mouth daily., Disp: , Rfl: ;  fish oil-omega-3 fatty acids 1000 MG capsule, Take 1 g by mouth daily., Disp: , Rfl: ;  lisinopril  (PRINIVIL,ZESTRIL) 5 MG tablet, Take 5 mg by mouth daily., Disp: , Rfl:  Multiple Vitamins-Minerals (ONE-A-DAY MENS VITACRAVES PO), Take by mouth daily., Disp: , Rfl: ;  nitroGLYCERIN (NITROSTAT) 0.4 MG SL tablet, Place 0.4 mg under the tongue every 5 (five) minutes as needed for chest pain., Disp: , Rfl: ;  oxyCODONE-acetaminophen (PERCOCET/ROXICET) 5-325 MG per tablet, Take 1 tablet by mouth every 8 (eight) hours as needed for pain., Disp: , Rfl:  oxyCODONE-acetaminophen (PERCOCET/ROXICET) 5-325 MG per tablet, Take 1-2 tablets by mouth every 4 (four) hours as needed for pain., Disp: 30 tablet, Rfl: 0;  PARoxetine (PAXIL) 20 MG tablet, Take 20 mg by mouth every morning., Disp: , Rfl: ;  ranitidine (ZANTAC) 150 MG tablet, Take 150 mg by mouth 2 (two) times daily., Disp: , Rfl: ;  aspirin 325 MG EC tablet, Take 325 mg by mouth daily., Disp: , Rfl:   BP 120/72  Pulse 62  Ht 5\' 10"  (1.778 m)  Wt 202 lb (91.627 kg)  BMI 28.98 kg/m2  SpO2 100%  Body mass index is 28.98 kg/(m^2).           Review of Systems denies chest pain, dyspnea on exertion, PND, orthopnea, hemoptysis.    Objective:   Physical Exam blood pressure 120/72 heart rate 62 respirations 16  Lungs no rhonchi or wheezing Right leg with 3+ femoral 2+ popliteal 2+ posterior tibial pulse palpable. Left leg with 2+ femoral no popliteal or distal pulses palpable. No pulsatile mass noted in the left inguinal area.    Assessment:     Successful PTA and stenting right superficial femoral artery with resolution of right leg claudication Persistent claudication left leg due to similar problem and left SFA    Plan:     Plan angiography left leg via right common femoral approach with probable PTA and stenting by Dr. Myra Gianotti on July 1-patient Justin Wells proceeded at all possible that week

## 2013-02-24 ENCOUNTER — Other Ambulatory Visit: Payer: Self-pay

## 2013-03-01 MED ORDER — SODIUM CHLORIDE 0.9 % IV SOLN
INTRAVENOUS | Status: DC
  Administered 2013-03-02: 17:00:00 via INTRAVENOUS

## 2013-03-02 ENCOUNTER — Encounter (HOSPITAL_COMMUNITY)
Admission: RE | Disposition: A | Discharge status: Home / Self Care | Payer: Self-pay | Source: Ambulatory Visit | Attending: Surgery

## 2013-03-02 ENCOUNTER — Other Ambulatory Visit: Payer: Self-pay | Admitting: *Deleted

## 2013-03-02 ENCOUNTER — Telehealth: Payer: Self-pay | Admitting: Surgery

## 2013-03-02 ENCOUNTER — Ambulatory Visit (HOSPITAL_COMMUNITY)
Admission: RE | Admit: 2013-03-02 | Discharge: 2013-03-02 | Disposition: A | Discharge status: Home / Self Care | Payer: BC Managed Care – PPO | Source: Ambulatory Visit | Attending: Surgery | Admitting: Surgery

## 2013-03-02 DIAGNOSIS — I69959 Hemiplegia and hemiparesis following unspecified cerebrovascular disease affecting unspecified side: Secondary | ICD-10-CM | POA: Insufficient documentation

## 2013-03-02 DIAGNOSIS — K2270 Barrett's esophagus without dysplasia: Secondary | ICD-10-CM | POA: Insufficient documentation

## 2013-03-02 DIAGNOSIS — Z7902 Long term (current) use of antithrombotics/antiplatelets: Secondary | ICD-10-CM | POA: Insufficient documentation

## 2013-03-02 DIAGNOSIS — K219 Gastro-esophageal reflux disease without esophagitis: Secondary | ICD-10-CM | POA: Insufficient documentation

## 2013-03-02 DIAGNOSIS — I252 Old myocardial infarction: Secondary | ICD-10-CM | POA: Insufficient documentation

## 2013-03-02 DIAGNOSIS — I739 Peripheral vascular disease, unspecified: Secondary | ICD-10-CM

## 2013-03-02 DIAGNOSIS — I2510 Atherosclerotic heart disease of native coronary artery without angina pectoris: Secondary | ICD-10-CM | POA: Insufficient documentation

## 2013-03-02 DIAGNOSIS — Z8249 Family history of ischemic heart disease and other diseases of the circulatory system: Secondary | ICD-10-CM | POA: Insufficient documentation

## 2013-03-02 DIAGNOSIS — Z48812 Encounter for surgical aftercare following surgery on the circulatory system: Secondary | ICD-10-CM

## 2013-03-02 DIAGNOSIS — I70219 Atherosclerosis of native arteries of extremities with intermittent claudication, unspecified extremity: Secondary | ICD-10-CM

## 2013-03-02 DIAGNOSIS — I7092 Chronic total occlusion of artery of the extremities: Secondary | ICD-10-CM | POA: Insufficient documentation

## 2013-03-02 DIAGNOSIS — E785 Hyperlipidemia, unspecified: Secondary | ICD-10-CM | POA: Insufficient documentation

## 2013-03-02 DIAGNOSIS — Z79899 Other long term (current) drug therapy: Secondary | ICD-10-CM | POA: Insufficient documentation

## 2013-03-02 DIAGNOSIS — M19049 Primary osteoarthritis, unspecified hand: Secondary | ICD-10-CM | POA: Insufficient documentation

## 2013-03-02 DIAGNOSIS — I10 Essential (primary) hypertension: Secondary | ICD-10-CM | POA: Insufficient documentation

## 2013-03-02 DIAGNOSIS — Z87891 Personal history of nicotine dependence: Secondary | ICD-10-CM | POA: Insufficient documentation

## 2013-03-02 HISTORY — PX: LOWER EXTREMITY ANGIOGRAM: SHX5508

## 2013-03-02 LAB — POCT ACTIVATED CLOTTING TIME: Activated Clotting Time: 176 seconds

## 2013-03-02 LAB — POCT I-STAT, CHEM 8
BUN: 7 mg/dL (ref 6–23)
Creatinine, Ser: 1 mg/dL (ref 0.50–1.35)
Potassium: 4.8 mEq/L (ref 3.5–5.1)
Sodium: 138 mEq/L (ref 135–145)
TCO2: 29 mmol/L (ref 0–100)

## 2013-03-02 SURGERY — ANGIOGRAM, LOWER EXTREMITY
Anesthesia: LOCAL | Laterality: Left

## 2013-03-02 MED ORDER — METOPROLOL TARTRATE 1 MG/ML IV SOLN: 2.0000 mg | INTRAVENOUS | Status: DC | PRN

## 2013-03-02 MED ORDER — ACETAMINOPHEN 325 MG PO TABS: 325.0000 mg | ORAL_TABLET | ORAL | Status: DC | PRN

## 2013-03-02 MED ORDER — LIDOCAINE HCL (PF) 1 % IJ SOLN
INTRAMUSCULAR | Status: AC
  Filled 2013-03-02: qty 30

## 2013-03-02 MED ORDER — LABETALOL HCL 5 MG/ML IV SOLN: 10.0000 mg | INTRAVENOUS | Status: DC | PRN

## 2013-03-02 MED ORDER — MIDAZOLAM HCL 2 MG/2ML IJ SOLN
INTRAMUSCULAR | Status: AC
  Filled 2013-03-02: qty 2

## 2013-03-02 MED ORDER — HYDRALAZINE HCL 20 MG/ML IJ SOLN: 10.0000 mg | INTRAMUSCULAR | Status: DC | PRN

## 2013-03-02 MED ORDER — HEPARIN SODIUM (PORCINE) 1000 UNIT/ML IJ SOLN
INTRAMUSCULAR | Status: AC
  Filled 2013-03-02: qty 1

## 2013-03-02 MED ORDER — MORPHINE SULFATE 10 MG/ML IJ SOLN
2.0000 mg | INTRAMUSCULAR | Status: DC | PRN
  Administered 2013-03-02: 2 mg via INTRAVENOUS

## 2013-03-02 MED ORDER — SODIUM CHLORIDE 0.9 % IV SOLN: 1.0000 mL/kg/h | INTRAVENOUS | Status: DC

## 2013-03-02 MED ORDER — FENTANYL CITRATE 0.05 MG/ML IJ SOLN
INTRAMUSCULAR | Status: AC
  Filled 2013-03-02: qty 2

## 2013-03-02 MED ORDER — HEPARIN (PORCINE) IN NACL 2-0.9 UNIT/ML-% IJ SOLN
INTRAMUSCULAR | Status: AC
  Filled 2013-03-02: qty 1000

## 2013-03-02 MED ORDER — ALUM & MAG HYDROXIDE-SIMETH 200-200-20 MG/5ML PO SUSP: 15.0000 mL | ORAL | Status: DC | PRN

## 2013-03-02 MED ORDER — ONDANSETRON HCL 4 MG/2ML IJ SOLN: 4.0000 mg | Freq: Four times a day (QID) | INTRAMUSCULAR | Status: DC | PRN

## 2013-03-02 MED ORDER — ACETAMINOPHEN 325 MG RE SUPP: 325.0000 mg | RECTAL | Status: DC | PRN

## 2013-03-02 MED ORDER — PHENOL 1.4 % MT LIQD: 1.0000 | OROMUCOSAL | Status: DC | PRN

## 2013-03-02 MED ORDER — GUAIFENESIN-DM 100-10 MG/5ML PO SYRP: 15.0000 mL | ORAL_SOLUTION | ORAL | Status: DC | PRN

## 2013-03-02 MED ORDER — OXYCODONE-ACETAMINOPHEN 5-325 MG PO TABS
1.0000 | ORAL_TABLET | ORAL | Status: DC | PRN
  Administered 2013-03-02: 2 via ORAL

## 2013-03-02 MED ORDER — OXYCODONE-ACETAMINOPHEN 5-325 MG PO TABS
ORAL_TABLET | ORAL | Status: AC
  Filled 2013-03-02: qty 2

## 2013-03-02 MED ORDER — MORPHINE SULFATE 2 MG/ML IJ SOLN
INTRAMUSCULAR | Status: AC
  Filled 2013-03-02: qty 1

## 2013-03-02 NOTE — Telephone Encounter (Addendum)
Message copied by Rosalyn Charters on Tue Mar 02, 2013  4:56 PM ------      Message from: Melene Plan      Created: Tue Mar 02, 2013  2:30 PM                   ----- Message -----         From: Nada Libman, MD         Sent: 03/02/2013   1:58 PM           To: Reuel Derby, Melene Plan, RN            03/02/2013:                  Surgeon:  Jorge Ny      Procedure Performed:       1.  ultrasound-guided access, right femoral artery       2.  second order catheterization       3.  left lower extremity runoff       4.  stent, left superficial femoral and popliteal artery            Needs f/u wityh JDL in 3 months with u/s ------  notified patient of fu appt. on 06-07-13 at 3pm with dr. Myra Gianotti

## 2013-03-02 NOTE — H&P (View-Only) (Signed)
Subjective:     Patient ID: Justin Wells, male   DOB: Dec 31, 1950, 62 y.o.   MRN: [id number]  HPI 62 year old male returns for followup regarding his bilateral superficial femoral occlusive disease. He underwent PTA and stenting of his right superficial femoral artery by Dr. Myra Gianotti on June 3. He has had an excellent result with complete resolution of claudication symptoms in the right leg. He continues to be limited by his left leg. Left leg was not steady at that time because of contrast constraints.   Past Medical History  Diagnosis Date  . Peripheral arterial disease   . Hyperlipidemia   . Coronary artery disease   . Hypertension   . Barrett's esophagus   . Gastroesophageal reflux   . Cerebrovascular disease   . Myocardial infarction 2009    "during cardiac cath" (01/27/2013)  . H/O hiatal hernia   . WUJWJXBJ(478.2)     "probably monthly" (01/27/2013)  . Seizures     "when I had my heart attack in 2009" (01/27/2013)  . 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 Arthritis     "hands" (01/27/2013)    History  Substance Use Topics  . Smoking status: Former Smoker -- 2.00 packs/day for 35 years    Types: Cigarettes    Quit date: 01/28/2001  . Smokeless tobacco: Never Used  . Alcohol Use: No     Comment: 01/27/2013 "quit drinking in 2001; I'm a recovering alcoholic"    Family History  Problem Relation Age of Onset  . Heart disease Mother     Heart Disease before age 73  . Hypertension Mother   . Heart attack Mother   . Heart attack Father     No Known Allergies  Current outpatient prescriptions:clopidogrel (PLAVIX) 75 MG tablet, Take 1 tablet by mouth daily., Disp: , Rfl: ;  Cyanocobalamin (VITAMIN B 12 PO), Take 500 mg by mouth daily., Disp: , Rfl: ;  fish oil-omega-3 fatty acids 1000 MG capsule, Take 1 g by mouth daily., Disp: , Rfl: ;  lisinopril  (PRINIVIL,ZESTRIL) 5 MG tablet, Take 5 mg by mouth daily., Disp: , Rfl:  Multiple Vitamins-Minerals (ONE-A-DAY MENS VITACRAVES PO), Take by mouth daily., Disp: , Rfl: ;  nitroGLYCERIN (NITROSTAT) 0.4 MG SL tablet, Place 0.4 mg under the tongue every 5 (five) minutes as needed for chest pain., Disp: , Rfl: ;  oxyCODONE-acetaminophen (PERCOCET/ROXICET) 5-325 MG per tablet, Take 1 tablet by mouth every 8 (eight) hours as needed for pain., Disp: , Rfl:  oxyCODONE-acetaminophen (PERCOCET/ROXICET) 5-325 MG per tablet, Take 1-2 tablets by mouth every 4 (four) hours as needed for pain., Disp: 30 tablet, Rfl: 0;  PARoxetine (PAXIL) 20 MG tablet, Take 20 mg by mouth every morning., Disp: , Rfl: ;  ranitidine (ZANTAC) 150 MG tablet, Take 150 mg by mouth 2 (two) times daily., Disp: , Rfl: ;  aspirin 325 MG EC tablet, Take 325 mg by mouth daily., Disp: , Rfl:   BP 120/72  Pulse 62  Ht 5\' 10"  (1.778 m)  Wt 202 lb (91.627 kg)  BMI 28.98 kg/m2  SpO2 100%  Body mass index is 28.98 kg/(m^2).           Review of Systems denies chest pain, dyspnea on exertion, PND, orthopnea, hemoptysis.    Objective:   Physical Exam blood pressure 120/72 heart rate 62 respirations 16  Lungs no rhonchi or wheezing Right leg with 3+ femoral 2+ popliteal 2+ posterior tibial pulse palpable. Left leg with 2+ femoral no popliteal or distal pulses palpable. No pulsatile mass noted in the left inguinal area.    Assessment:     Successful PTA and stenting right superficial femoral artery with resolution of right leg claudication Persistent claudication left leg due to similar problem and left SFA    Plan:     Plan angiography left leg via right common femoral approach with probable PTA and stenting by Dr. Myra Gianotti on July 1-patient Justin Wells proceeded at all possible that week

## 2013-03-02 NOTE — Interval H&P Note (Signed)
History and Physical Interval Note:  03/02/2013 12:17 PM  Justin Wells  has presented today for surgery, with the diagnosis of PVD  The various methods of treatment have been discussed with the patient and family. After consideration of risks, benefits and other options for treatment, the patient has consented to  Procedure(s): LOWER EXTREMITY ANGIOGRAM (Left) as a surgical intervention .  The patient's history has been reviewed, patient examined, no change in status, stable for surgery.  I have reviewed the patient's chart and labs.  Questions were answered to the patient's satisfaction.     BRABHAM IV, [name]

## 2013-03-02 NOTE — Op Note (Signed)
[facility]  Patient name: Justin Wells MRN: [id number] DOB: [birth year] Sex: male  03/02/2013 Pre-operative Diagnosis:  Left lower extremity claudication Post-operative diagnosis:  Same Surgeon:  Jorge Ny Procedure Performed:  1.  ultrasound-guided access, right femoral artery  2.  second order catheterization  3.  left lower extremity runoff  4.  stent, left superficial femoral and popliteal artery    Indications:  The patient is previously undergone percutaneous revascularization of his right leg with excellent results. He now comes in to address his left leg. He has significant lifestyle limiting claudication.  Procedure:  The patient was identified in the holding area and taken to room 8.  The patient was then placed supine on the table and prepped and draped in the usual sterile fashion.  A time out was called.  Ultrasound was used to evaluate the right common femoral artery.  It was patent .  A digital ultrasound image was acquired.  A micropuncture needle was used to access the right common femoral artery under ultrasound guidance.  An 018 wire was advanced without resistance and a micropuncture sheath was placed.  The 018 wire was removed and a benson wire was placed.  The micropuncture sheath was exchanged for a 5 french sheath.  An omniflush catheter was advanced into the aorta. A Benson wire an omni-flush catheter were used across the bifurcation. The catheter was placed in the left external iliac artery and left lower extremity runoff was performed.   Findings:   Left Lower Extremity:  The left common femoral artery is widely patent. The left profunda femoral artery is widely patent. The superficial femoral artery is occluded. There is reconstitution of the above-knee popliteal artery. There is mild stenosis within the above-knee popliteal artery, approximately 20%. There appears to be three-vessel runoff to the leg however due to the  patient's cardiac output and proximal disease the tibial vessels could not be well defined.  Intervention:  After the above images were acquired, the decision was made to proceed with intervention. Over an Amplatz superstiff wire, a 7 [place] Ansel 1 sheath was advanced into the left external iliac artery. The patient was fully heparinized. Using a Glidewire and a quick cross catheter, subintimal recanalization was performed. Reentry was in the proximal above-knee popliteal artery. This was confirmed with a contrast injection. A woolly wire was then placed at the quick cross catheter was removed. I predilated the occlusion with a 4 mm balloon. I then placed overlapping 7 mm Cordis self-expanding stents. The distal stent was a 7 x 1 50. The middle stent was a 7 x 1 50. The proximal stent was a 7 x 40. The stents were molded to confirmation with a 6 mm balloon. Completion angiography revealed widely patent superficial femoral artery and their corresponding stents. The popliteal artery is widely patent. It was very difficult to evaluate the tibial vessels do to the patient's low cardiac output. There was contrast opacification of the level of the ankle. After the above images were acquired, the decision was made to terminate the procedure. The catheters and wires were removed. The sheath was withdrawn to the right external iliac artery. The patient be taken the holding area for sheath pull once his coagulation profile has corrected.  Impression:  #1  successful stenting of an occluded left superficial femoral artery using overlapping 7 mm self-expanding stents.  #2  tibial vessels could not be well defined do to the patient's cardiac output.  Theotis Burrow, M.D. [facility] Office: [phone] Pager:  [phone]

## 2013-03-03 LAB — POCT I-STAT, CHEM 8
BUN: 9 mg/dL (ref 6–23)
Creatinine, Ser: 1 mg/dL (ref 0.50–1.35)
Glucose, Bld: 108 mg/dL — ABNORMAL HIGH (ref 70–99)
Sodium: 135 mEq/L (ref 135–145)
TCO2: 32 mmol/L (ref 0–100)

## 2013-03-09 DIAGNOSIS — M159 Polyosteoarthritis, unspecified: Secondary | ICD-10-CM | POA: Insufficient documentation

## 2013-03-09 DIAGNOSIS — F329 Major depressive disorder, single episode, unspecified: Secondary | ICD-10-CM | POA: Insufficient documentation

## 2013-03-09 DIAGNOSIS — F32A Depression, unspecified: Secondary | ICD-10-CM | POA: Insufficient documentation

## 2013-06-01 ENCOUNTER — Other Ambulatory Visit: Payer: Self-pay | Admitting: Surgery

## 2013-06-07 ENCOUNTER — Ambulatory Visit: Payer: BC Managed Care – PPO | Admitting: Surgery

## 2013-06-11 ENCOUNTER — Encounter: Payer: Self-pay | Admitting: Surgery

## 2013-06-14 ENCOUNTER — Ambulatory Visit: Payer: BC Managed Care – PPO | Admitting: Surgery

## 2013-06-14 ENCOUNTER — Other Ambulatory Visit (HOSPITAL_COMMUNITY): Payer: BC Managed Care – PPO

## 2013-06-25 ENCOUNTER — Encounter: Payer: Self-pay | Admitting: Surgery

## 2013-06-28 ENCOUNTER — Ambulatory Visit: Payer: BC Managed Care – PPO | Admitting: Surgery

## 2013-06-28 ENCOUNTER — Inpatient Hospital Stay (HOSPITAL_COMMUNITY): Admission: RE | Admit: 2013-06-28 | Payer: BC Managed Care – PPO | Source: Ambulatory Visit

## 2013-06-30 ENCOUNTER — Other Ambulatory Visit: Payer: Self-pay | Admitting: Surgery

## 2013-06-30 DIAGNOSIS — I739 Peripheral vascular disease, unspecified: Secondary | ICD-10-CM

## 2013-07-12 DIAGNOSIS — M2550 Pain in unspecified joint: Secondary | ICD-10-CM | POA: Insufficient documentation

## 2013-10-12 ENCOUNTER — Other Ambulatory Visit: Payer: Self-pay | Admitting: *Deleted

## 2013-10-12 DIAGNOSIS — I739 Peripheral vascular disease, unspecified: Secondary | ICD-10-CM

## 2013-10-12 MED ORDER — CLOPIDOGREL BISULFATE 75 MG PO TABS: 75.0000 mg | ORAL_TABLET | Freq: Every day | ORAL | Status: DC

## 2013-11-18 DIAGNOSIS — R413 Other amnesia: Secondary | ICD-10-CM | POA: Insufficient documentation

## 2013-11-18 DIAGNOSIS — Z125 Encounter for screening for malignant neoplasm of prostate: Secondary | ICD-10-CM | POA: Insufficient documentation

## 2013-12-27 ENCOUNTER — Inpatient Hospital Stay (HOSPITAL_COMMUNITY): Payer: BC Managed Care – PPO

## 2013-12-27 ENCOUNTER — Inpatient Hospital Stay (HOSPITAL_COMMUNITY)
Admission: EM | Admit: 2013-12-27 | Discharge: 2013-12-29 | DRG: 062 | Disposition: A | Discharge status: Rehabilitation Facility | Payer: BC Managed Care – PPO | Attending: Neurology | Admitting: Neurology

## 2013-12-27 ENCOUNTER — Encounter (HOSPITAL_COMMUNITY): Payer: Self-pay | Admitting: Emergency Medicine

## 2013-12-27 ENCOUNTER — Emergency Department (HOSPITAL_COMMUNITY): Payer: BC Managed Care – PPO

## 2013-12-27 DIAGNOSIS — I6330 Cerebral infarction due to thrombosis of unspecified cerebral artery: Principal | ICD-10-CM | POA: Diagnosis present

## 2013-12-27 DIAGNOSIS — Z8249 Family history of ischemic heart disease and other diseases of the circulatory system: Secondary | ICD-10-CM

## 2013-12-27 DIAGNOSIS — F3289 Other specified depressive episodes: Secondary | ICD-10-CM | POA: Diagnosis present

## 2013-12-27 DIAGNOSIS — I10 Essential (primary) hypertension: Secondary | ICD-10-CM | POA: Diagnosis present

## 2013-12-27 DIAGNOSIS — Z87891 Personal history of nicotine dependence: Secondary | ICD-10-CM

## 2013-12-27 DIAGNOSIS — G8190 Hemiplegia, unspecified affecting unspecified side: Secondary | ICD-10-CM | POA: Diagnosis present

## 2013-12-27 DIAGNOSIS — I252 Old myocardial infarction: Secondary | ICD-10-CM

## 2013-12-27 DIAGNOSIS — E785 Hyperlipidemia, unspecified: Secondary | ICD-10-CM | POA: Diagnosis present

## 2013-12-27 DIAGNOSIS — R569 Unspecified convulsions: Secondary | ICD-10-CM | POA: Diagnosis present

## 2013-12-27 DIAGNOSIS — Z7982 Long term (current) use of aspirin: Secondary | ICD-10-CM

## 2013-12-27 DIAGNOSIS — I639 Cerebral infarction, unspecified: Secondary | ICD-10-CM

## 2013-12-27 DIAGNOSIS — F1021 Alcohol dependence, in remission: Secondary | ICD-10-CM | POA: Diagnosis present

## 2013-12-27 DIAGNOSIS — I739 Peripheral vascular disease, unspecified: Secondary | ICD-10-CM | POA: Diagnosis present

## 2013-12-27 DIAGNOSIS — Z9861 Coronary angioplasty status: Secondary | ICD-10-CM

## 2013-12-27 DIAGNOSIS — R079 Chest pain, unspecified: Secondary | ICD-10-CM

## 2013-12-27 DIAGNOSIS — Z9884 Bariatric surgery status: Secondary | ICD-10-CM

## 2013-12-27 DIAGNOSIS — J40 Bronchitis, not specified as acute or chronic: Secondary | ICD-10-CM | POA: Diagnosis present

## 2013-12-27 DIAGNOSIS — Z7902 Long term (current) use of antithrombotics/antiplatelets: Secondary | ICD-10-CM

## 2013-12-27 DIAGNOSIS — I2510 Atherosclerotic heart disease of native coronary artery without angina pectoris: Secondary | ICD-10-CM | POA: Diagnosis present

## 2013-12-27 DIAGNOSIS — F329 Major depressive disorder, single episode, unspecified: Secondary | ICD-10-CM | POA: Diagnosis present

## 2013-12-27 DIAGNOSIS — K219 Gastro-esophageal reflux disease without esophagitis: Secondary | ICD-10-CM | POA: Diagnosis present

## 2013-12-27 LAB — COMPREHENSIVE METABOLIC PANEL
ALK PHOS: 88 U/L (ref 39–117)
ALT: 19 U/L (ref 0–53)
AST: 24 U/L (ref 0–37)
Albumin: 3.7 g/dL (ref 3.5–5.2)
BILIRUBIN TOTAL: 0.4 mg/dL (ref 0.3–1.2)
BUN: 12 mg/dL (ref 6–23)
CO2: 24 mEq/L (ref 19–32)
Calcium: 8.7 mg/dL (ref 8.4–10.5)
Chloride: 99 mEq/L (ref 96–112)
Creatinine, Ser: 0.92 mg/dL (ref 0.50–1.35)
GFR calc Af Amer: >90 mL/min (ref >90)
GFR, EST NON AFRICAN AMERICAN: 90 mL/min — AB (ref >90)
Glucose, Bld: 76 mg/dL (ref 70–99)
Potassium: 4.1 mEq/L (ref 3.7–5.3)
SODIUM: 136 meq/L — AB (ref 137–147)
Total Protein: 6.8 g/dL (ref 6.0–8.3)

## 2013-12-27 LAB — CBC
HCT: 42.1 % (ref 39.0–52.0)
Hemoglobin: 14.7 g/dL (ref 13.0–17.0)
MCH: 30.3 pg (ref 26.0–34.0)
MCHC: 34.9 g/dL (ref 30.0–36.0)
MCV: 86.8 fL (ref 78.0–100.0)
Platelets: 178 10*3/uL (ref 150–400)
RBC: 4.85 MIL/uL (ref 4.22–5.81)
RDW: 14.7 % (ref 11.5–15.5)
WBC: 8.2 10*3/uL (ref 4.0–10.5)

## 2013-12-27 LAB — DIFFERENTIAL
Basophils Absolute: 0.1 10*3/uL (ref 0.0–0.1)
Basophils Relative: 1 % (ref 0–1)
EOS ABS: 0.6 10*3/uL (ref 0.0–0.7)
EOS PCT: 7 % — AB (ref 0–5)
Lymphocytes Relative: 16 % (ref 12–46)
Lymphs Abs: 1.3 10*3/uL (ref 0.7–4.0)
Monocytes Absolute: 0.9 10*3/uL (ref 0.1–1.0)
Monocytes Relative: 11 % (ref 3–12)
Neutro Abs: 5.4 10*3/uL (ref 1.7–7.7)
Neutrophils Relative %: 65 % (ref 43–77)

## 2013-12-27 LAB — MRSA PCR SCREENING: MRSA by PCR: NEGATIVE

## 2013-12-27 LAB — I-STAT CHEM 8, ED
BUN: 11 mg/dL (ref 6–23)
CALCIUM ION: 1.1 mmol/L — AB (ref 1.13–1.30)
CHLORIDE: 101 meq/L (ref 96–112)
Creatinine, Ser: 1 mg/dL (ref 0.50–1.35)
Glucose, Bld: 77 mg/dL (ref 70–99)
HEMATOCRIT: 43 % (ref 39.0–52.0)
Hemoglobin: 14.6 g/dL (ref 13.0–17.0)
Potassium: 4 mEq/L (ref 3.7–5.3)
Sodium: 138 mEq/L (ref 137–147)
TCO2: 23 mmol/L (ref 0–100)

## 2013-12-27 LAB — PROTIME-INR
INR: 0.97 (ref 0.00–1.49)
Prothrombin Time: 12.7 seconds (ref 11.6–15.2)

## 2013-12-27 LAB — RAPID URINE DRUG SCREEN, HOSP PERFORMED
Amphetamines: NOT DETECTED
Barbiturates: NOT DETECTED
Benzodiazepines: NOT DETECTED
COCAINE: NOT DETECTED
OPIATES: POSITIVE — AB
TETRAHYDROCANNABINOL: NOT DETECTED

## 2013-12-27 LAB — URINALYSIS, ROUTINE W REFLEX MICROSCOPIC
BILIRUBIN URINE: NEGATIVE
Glucose, UA: NEGATIVE mg/dL
HGB URINE DIPSTICK: NEGATIVE
Ketones, ur: NEGATIVE mg/dL
Leukocytes, UA: NEGATIVE
NITRITE: NEGATIVE
PH: 6 (ref 5.0–8.0)
Protein, ur: NEGATIVE mg/dL
SPECIFIC GRAVITY, URINE: 1.024 (ref 1.005–1.030)
UROBILINOGEN UA: 1 mg/dL (ref 0.0–1.0)

## 2013-12-27 LAB — APTT: aPTT: 24 seconds (ref 24–37)

## 2013-12-27 LAB — I-STAT TROPONIN, ED: TROPONIN I, POC: 0 ng/mL (ref 0.00–0.08)

## 2013-12-27 LAB — ETHANOL

## 2013-12-27 LAB — CBG MONITORING, ED: GLUCOSE-CAPILLARY: 72 mg/dL (ref 70–99)

## 2013-12-27 MED ORDER — LABETALOL HCL 5 MG/ML IV SOLN: 10.0000 mg | INTRAVENOUS | Status: DC | PRN

## 2013-12-27 MED ORDER — SODIUM CHLORIDE 0.9 % IV SOLN
INTRAVENOUS | Status: DC
  Administered 2013-12-27: 1000 mL via INTRAVENOUS

## 2013-12-27 MED ORDER — ALTEPLASE (STROKE) FULL DOSE INFUSION
86.0000 mg | Freq: Once | INTRAVENOUS | Status: AC
  Administered 2013-12-27: 86 mg via INTRAVENOUS
  Filled 2013-12-27: qty 86

## 2013-12-27 MED ORDER — SENNOSIDES-DOCUSATE SODIUM 8.6-50 MG PO TABS
1.0000 | ORAL_TABLET | Freq: Every evening | ORAL | Status: DC | PRN
  Filled 2013-12-27: qty 1

## 2013-12-27 MED ORDER — ACETAMINOPHEN 650 MG RE SUPP
650.0000 mg | RECTAL | Status: DC | PRN
  Administered 2013-12-27: 650 mg via RECTAL
  Filled 2013-12-27: qty 1

## 2013-12-27 MED ORDER — PANTOPRAZOLE SODIUM 40 MG IV SOLR
40.0000 mg | Freq: Every day | INTRAVENOUS | Status: DC
  Administered 2013-12-27: 40 mg via INTRAVENOUS
  Filled 2013-12-27 (×2): qty 40

## 2013-12-27 MED ORDER — ACETAMINOPHEN 325 MG PO TABS
650.0000 mg | ORAL_TABLET | ORAL | Status: DC | PRN
  Administered 2013-12-28 (×3): 650 mg via ORAL
  Filled 2013-12-27 (×3): qty 2

## 2013-12-27 MED ORDER — MORPHINE SULFATE 4 MG/ML IJ SOLN
4.0000 mg | Freq: Once | INTRAMUSCULAR | Status: AC
  Administered 2013-12-27: 4 mg via INTRAVENOUS
  Filled 2013-12-27: qty 1

## 2013-12-27 NOTE — Code Documentation (Signed)
Code stroke called at 1220, [facility].  AS per patient and family,LSN 1100 he developed dizziness, became diaphoretic.  EMS noticed some right side weakness and facial droop and activated Code stroke.  On arrival patient c/o severe chest pain radiating to back--CT head and CTA chest, abd and pelvis done.  NIHSS 4.  TPA administered at 1326

## 2013-12-27 NOTE — ED Notes (Signed)
Patient transported to MRI 

## 2013-12-27 NOTE — ED Notes (Signed)
Awaiting ct scan of chest to be read to proceed with TPA

## 2013-12-27 NOTE — Progress Notes (Signed)
Spoke to Dr. Thad Rangereynolds concerning the patients HA and neck pain. The patient did not want to take the tylenol suppository for pain. The HA and neck pain were progressively getting worse however the patients NIH and neuro status did not change (NIH 3). No new orders at this time, will continue to monitor patient closely.

## 2013-12-27 NOTE — ED Notes (Signed)
Will transfer pt to 3 M once he returns from MRI

## 2013-12-27 NOTE — ED Notes (Signed)
MD at bedside. Neuro team

## 2013-12-27 NOTE — ED Provider Notes (Signed)
CSN: [id number]     Arrival date & time 12/27/13  1229 History   First MD Initiated Contact with Patient 12/27/13 1238     Chief Complaint  Patient presents with  . Code Stroke     (Consider location/radiation/quality/duration/timing/severity/associated sxs/prior Treatment) The history is provided by the patient.  KENNITH MORSS is a 63 y.o. male hx of MI, HL, reflux, strokes here with chest pain, weakness. Chest pain around 8am this morning. Chest pain radiated to his back. Sharp pain. Denies abdominal pain. Has some shortness of breath as well. He then had some right sided weakness but he is unclear when it started exactly. He states that he had stroke with previous MIs. Seen at triage by EM resident and code stroke activated and CT head ordered for stroke and CT angio ordered for dissection.    Past Medical History  Diagnosis Date  . Peripheral arterial disease   . Hyperlipidemia   . Coronary artery disease   . Hypertension   . Barrett's esophagus   . Gastroesophageal reflux   . Cerebrovascular disease   . Myocardial infarction 2009    "during cardiac cath" (01/27/2013)  . H/O hiatal hernia   . [id number](478.2)     "probably monthly" (01/27/2013)  . Seizures     "when I had my heart attack in 2009" (01/27/2013)  . 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 Arthritis     "hands" (01/27/2013)   Past Surgical History  Procedure Laterality Date  . Laparoscopic gastric banding  2010  . Cardiac catheterization    . Coronary angioplasty with stent placement  11/02/2007    "2" (01/27/2013)  . Laparoscopic nissen fundoplication  ?2001  . Femoral artery stent Right 01/26/2013  . Diagnostic laparoscopy      "twice after nissen; it kept coming apart " (01/27/2013)   Family History  Problem Relation Age of Onset  . Heart disease Mother     Heart Disease before age 63  .  Hypertension Mother   . Heart attack Mother   . Heart attack Father    History  Substance Use Topics  . Smoking status: Former Smoker -- 2.00 packs/day for 35 years    Types: Cigarettes    Quit date: 01/28/2001  . Smokeless tobacco: Never Used  . Alcohol Use: No     Comment: 01/27/2013 "quit drinking in 2001; I'm a recovering alcoholic"    Review of Systems  Respiratory: Positive for shortness of breath.   Cardiovascular: Positive for chest pain.  Neurological: Positive for weakness.  All other systems reviewed and are negative.     Allergies  Review of patient's allergies indicates no known allergies.  Home Medications   Prior to Admission medications   Medication Sig Start Date End Date Taking? Authorizing Provider  aspirin 325 MG EC tablet Take 325 mg by mouth daily.    Historical Provider, MD  clopidogrel (PLAVIX) 75 MG tablet Take 1 tablet (75 mg total) by mouth daily. 10/12/13   Nada Libman, MD  fish oil-omega-3 fatty acids 1000 MG capsule Take 1 g by mouth 2 (two) times daily.     Historical Provider, MD  lisinopril (PRINIVIL,ZESTRIL) 5 MG tablet Take 5 mg by mouth daily.    Historical Provider, MD  Multiple Vitamins-Minerals (ONE-A-DAY MENS VITACRAVES PO) Take by mouth daily.  Historical Provider, MD  nitroGLYCERIN (NITROSTAT) 0.4 MG SL tablet Place 0.4 mg under the tongue every 5 (five) minutes as needed for chest pain.    Historical Provider, MD  oxyCODONE-acetaminophen (PERCOCET/ROXICET) 5-325 MG per tablet Take 1 tablet by mouth every 6 (six) hours as needed for pain.     Historical Provider, MD  PARoxetine (PAXIL) 20 MG tablet Take 20 mg by mouth every morning.    Historical Provider, MD  ranitidine (ZANTAC) 150 MG tablet Take 150 mg by mouth 2 (two) times daily.    Historical Provider, MD  vitamin B-12 (CYANOCOBALAMIN) 500 MCG tablet Take 500 mcg by mouth daily.    Historical Provider, MD   BP 139/81  Pulse 68  Temp(Src) 98.5 F (36.9 C) (Oral)  Resp 13   Ht 5\' 10"  (1.778 m)  Wt 202 lb (91.627 kg)  BMI 28.98 kg/m2  SpO2 99% Physical Exam  Nursing note and vitals reviewed. Constitutional: He is oriented to person, place, and time.  Uncomfortable   HENT:  Head: Normocephalic.  Mouth/Throat: Oropharynx is clear and moist.  Eyes: Conjunctivae and EOM are normal. Pupils are equal, round, and reactive to light.  Neck: Normal range of motion. Neck supple.  Cardiovascular: Regular rhythm and normal heart sounds.   Slightly tachy   Pulmonary/Chest: Effort normal and breath sounds normal. No respiratory distress. He has no wheezes. He has no rales.  Abdominal: Soft. Bowel sounds are normal. He exhibits no distension. There is no tenderness. There is no rebound and no guarding.  Musculoskeletal: Normal range of motion. He exhibits no edema and no tenderness.  Neurological: He is alert and oriented to person, place, and time.  Strength 4/5 R side, 5/5 L side, nl speech   Skin: Skin is warm and dry.  Psychiatric: He has a normal mood and affect. His behavior is normal. Judgment and thought content normal.    ED Course  Procedures (including critical care time)  CRITICAL CARE Performed by: Richardean Canalavid H Yao   Total critical care time: 30 min   Critical care time was exclusive of separately billable procedures and treating other patients.  Critical care was necessary to treat or prevent imminent or life-threatening deterioration.  Critical care was time spent personally by me on the following activities: development of treatment plan with patient and/or surrogate as well as nursing, discussions with consultants, evaluation of patient's response to treatment, examination of patient, obtaining history from patient or surrogate, ordering and performing treatments and interventions, ordering and review of laboratory studies, ordering and review of radiographic studies, pulse oximetry and re-evaluation of patient's condition.  Labs Review Labs  Reviewed  DIFFERENTIAL - Abnormal; Notable for the following:    Eosinophils Relative 7 (*)    All other components within normal limits  COMPREHENSIVE METABOLIC PANEL - Abnormal; Notable for the following:    Sodium 136 (*)    GFR calc non Af Amer 90 (*)    All other components within normal limits  I-STAT CHEM 8, ED - Abnormal; Notable for the following:    Calcium, Ion 1.10 (*)    All other components within normal limits  ETHANOL  PROTIME-INR  APTT  CBC  URINE RAPID DRUG SCREEN (HOSP PERFORMED)  URINALYSIS, ROUTINE W REFLEX MICROSCOPIC  I-STAT TROPOININ, ED  I-STAT TROPOININ, ED  CBG MONITORING, ED    Imaging Review Ct Head Wo Contrast  12/27/2013   CLINICAL DATA:  Right lower extremity weakness of acute onset  EXAM: CT HEAD WITHOUT  CONTRAST  TECHNIQUE: Contiguous axial images were obtained from the base of the skull through the vertex without intravenous contrast. Study was obtained within 24 hr of patient's arrival at the emergency department.  COMPARISON:  September 08, 2007  FINDINGS: The ventricles are normal in size and configuration. There is no appreciable mass, hemorrhage, extra-axial fluid collection, or midline shift. There is subtle decreased attenuation in the lower right mid brain and upper right pons, concerning for recent infarct in this area. Elsewhere, gray-white compartments appear normal. There is mild stable increased attenuation in both middle cerebral arteries, a stable symmetric finding of doubtful significance.  Bony calvarium appears intact. The mastoid air cells are clear. There is mucosal thickening in both maxillary antra as well as extensive ethmoid sinus disease bilaterally. There is moderate mucosal thickening in the inferior frontal sinuses bilaterally as well as in the left and right sphenoid sinuses.  IMPRESSION: Question recent infarct in the lower right mid brain and upper right pons. Elsewhere gray-white compartments appear normal. No acute hemorrhage or  mass. Multifocal paranasal sinus disease, most pronounced in the ethmoid complexes bilaterally.  Critical Value/emergent results were called by telephone at the time of interpretation on 12/27/2013 at 12:52 PM to Dr. Cyril Mourning, neurology3, who verbally acknowledged these results.   Electronically Signed   By: Bretta Bang M.D.   On: 12/27/2013 12:56   Ct Angio Chest W/cm &/or Wo Cm  12/27/2013   CLINICAL DATA:  Chest pain radiating to back question aortic dissection, history coronary artery disease post MI, hypertension, former smoker  EXAM: CT ANGIOGRAPHY CHEST, ABDOMEN AND PELVIS  TECHNIQUE: Multidetector CT imaging through the chest, abdomen and pelvis was performed using the standard protocol during bolus administration of intravenous contrast. Multiplanar reconstructed images and MIPs were obtained and reviewed to evaluate the vascular anatomy. Precontrast images of the thorax were also obtained.  CONTRAST:  100 ml Omnipaque 350 IV.  COMPARISON:  None  FINDINGS: CTA CHEST FINDINGS  Atherosclerotic calcifications aorta and coronary arteries with question coronary stents.  No intramural hematoma identified on precontrast images.  Normal aortic enhancement following contrast without aortic aneurysm or dissection.  Pulmonary arteries appear grossly patent without evidence of pulmonary embolism on exam optimized for aortic imaging.  Minimally enlarged LEFT hilar lymph node 13 mm short axis image 73.  Small hiatal hernia.  Dependent atelectasis in both lungs.  No acute infiltrate, pleural effusion or pneumothorax.  Bone island T6 vertebral body.  Review of the MIP images confirms the above findings.  CTA ABDOMEN AND PELVIS FINDINGS  Scattered atherosclerotic calcifications without aneurysm or dissection.  Normal variant separate origin of common hepatic artery from aorta.  Mild narrowing of proximal SMA and origin of celiac artery.  Large gallstones dependently in gallbladder.  Liver, spleen, pancreas, kidneys,  and adrenal glands normal appearance.  Normal appendix.  Scattered diverticulosis of the descending and sigmoid colon without diverticulitis.  Stomach and bowel loops otherwise unremarkable for technique.  No mass, adenopathy, free fluid or inflammatory process.  BILATERAL inguinal hernias containing fat.  No acute osseous findings.  Review of the MIP images confirms the above findings.  IMPRESSION: Scattered atherosclerotic disease changes without evidence of aneurysm or dissection.  Small hiatal hernia.  Single minimally enlarged nonspecific LEFT hilar lymph node.  Cholelithiasis.  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  BILATERAL inguinal hernias containing fat.  Distal colonic diverticulosis without evidence of diverticulitis.   Electronically Signed   By: Angelyn Punt.D.  On: 12/27/2013 13:34   Dg Chest Port 1 View  12/27/2013   CLINICAL DATA:  Stroke  EXAM: PORTABLE CHEST - 1 VIEW  COMPARISON:  CT ANGIO CHEST W/CM &/OR WO/CM dated 12/27/2013  FINDINGS: The heart size and mediastinal contours are within normal limits. Both lungs are clear. The visualized skeletal structures are unremarkable.  IMPRESSION: No active disease.   Electronically Signed   By: Elige Ko   On: 12/27/2013 13:56   Ct Cta Abd/pel W/cm &/or W/o Cm  12/27/2013   CLINICAL DATA:  Chest pain radiating to back question aortic dissection, history coronary artery disease post MI, hypertension, former smoker  EXAM: CT ANGIOGRAPHY CHEST, ABDOMEN AND PELVIS  TECHNIQUE: Multidetector CT imaging through the chest, abdomen and pelvis was performed using the standard protocol during bolus administration of intravenous contrast. Multiplanar reconstructed images and MIPs were obtained and reviewed to evaluate the vascular anatomy. Precontrast images of the thorax were also obtained.  CONTRAST:  100 ml Omnipaque 350 IV.  COMPARISON:  None  FINDINGS: CTA CHEST FINDINGS  Atherosclerotic calcifications aorta and coronary arteries with  question coronary stents.  No intramural hematoma identified on precontrast images.  Normal aortic enhancement following contrast without aortic aneurysm or dissection.  Pulmonary arteries appear grossly patent without evidence of pulmonary embolism on exam optimized for aortic imaging.  Minimally enlarged LEFT hilar lymph node 13 mm short axis image 73.  Small hiatal hernia.  Dependent atelectasis in both lungs.  No acute infiltrate, pleural effusion or pneumothorax.  Bone island T6 vertebral body.  Review of the MIP images confirms the above findings.  CTA ABDOMEN AND PELVIS FINDINGS  Scattered atherosclerotic calcifications without aneurysm or dissection.  Normal variant separate origin of common hepatic artery from aorta.  Mild narrowing of proximal SMA and origin of celiac artery.  Large gallstones dependently in gallbladder.  Liver, spleen, pancreas, kidneys, and adrenal glands normal appearance.  Normal appendix.  Scattered diverticulosis of the descending and sigmoid colon without diverticulitis.  Stomach and bowel loops otherwise unremarkable for technique.  No mass, adenopathy, free fluid or inflammatory process.  BILATERAL inguinal hernias containing fat.  No acute osseous findings.  Review of the MIP images confirms the above findings.  IMPRESSION: Scattered atherosclerotic disease changes without evidence of aneurysm or dissection.  Small hiatal hernia.  Single minimally enlarged nonspecific LEFT hilar lymph node.  Cholelithiasis.  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  BILATERAL inguinal hernias containing fat.  Distal colonic diverticulosis without evidence of diverticulitis.   Electronically Signed   By: Ulyses Southward M.D.   On: 12/27/2013 13:34     EKG Interpretation   Date/Time:  Monday December 27 2013 12:40:28 EDT Ventricular Rate:  67 PR Interval:  144 QRS Duration: 96 QT Interval:  406 QTC Calculation: 429 R Axis:   37 Text Interpretation:  Normal sinus rhythm Normal ECG  minimal ST  depressions inferior and lateral slightly different than before  Confirmed  by YAO  MD, DAVID ([id number]) on 12/27/2013 1:12:04 PM      MDM   Final diagnoses:  None    TAVI HOOGENDOORN is a 63 y.o. male here with chest pain, R sided weakness. Code stroke activated and neuro at bedside. Consider stroke vs TIA. Given chest pain, consider ACS vs dissection vs PE. Will get dissection study, will need admission for stroke workup and r/o ACS.   1:40 pm CT showed no dissection. Neuro wanted to give TPA given persistent neuro symptoms.  Neuro will admit.    Richardean Canalavid H Yao, MD 12/27/13 1410

## 2013-12-27 NOTE — Consult Note (Signed)
Referring Physician: ED    Chief Complaint: CODE STROKE RIGHT HEMIPARESIS, RIGHT FACE DROOPINESS.  HPI:                                                                                                                                         MISTY RAGO is an 63 y.o. male with a past medical history significant for HTN, hyperlipidemia, CAD s/p stent deployment x 3,  MI, PAD s/p femoral stenting, TIA's, ischemic stroke in the setting of acute MI  2014, brought to [facility] ED by ambulance as a code stroke due to acute onset of the above stated symptoms. He aid that he was home sitting in a couch when started feeling weak all over, developed severe dizziness " like room spinning" and then ambulance was called. According to patient and EMS, weakness of the right arm-leg and face as well as chest pain developed while in the truck. No double vision, difficulty swallowing, slurred speech, language or vision impairment. Chest pain radiated to his shoulders and back. NIHSS 4, BUT WITH SOME INCONSISTENCIES (1 for sensory, 1 for right arm weakness, 2 for right arm weakness). CT brain showed a subtle decreased attenuation in the lower right mid brain and upper right pons, concerning for acute ischemiain this area. Patient underwent CTA chest that showed no evidence of dissection or PE. He takes plavix daily. Date last known well: 12/27/13 Time last known well: 11 am tPA Given: no NIHSS: 4 (1 for sensory, 1 for right arm weakness, 2 for right arm weakness). MRS: 2  Past Medical History  Diagnosis Date  . Peripheral arterial disease   . Hyperlipidemia   . Coronary artery disease   . Hypertension   . Barrett's esophagus   . Gastroesophageal reflux   . Cerebrovascular disease   . Myocardial infarction 2009    "during cardiac cath" (01/27/2013)  . H/O hiatal hernia   . OJJKKXFG(182.9)     "probably monthly" (01/27/2013)  . Seizures     "when I had my heart attack in 2009" (01/27/2013)  . Stroke 2009  2010     "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, weak right leg and hand; I've had 2 or 3 strokes; can't remember the dates" (01/27/2013)  . Arthritis     "hands" (01/27/2013)    Past Surgical History  Procedure Laterality Date  . Laparoscopic gastric banding  2010  . Cardiac catheterization    . Coronary angioplasty with stent placement  11/02/2007    "2" (01/27/2013)  . Laparoscopic nissen fundoplication  ?2001  . Femoral artery stent Right 01/26/2013  . Diagnostic laparoscopy      "twice after nissen; it kept coming apart " (01/27/2013)    Family History  Problem Relation Age of Onset  . Heart disease Mother     Heart Disease before age 49  . Hypertension Mother   . Heart  attack Mother   . Heart attack Father    Social History:  reports that he quit smoking about 12 years ago. His smoking use included Cigarettes. He has a 70 pack-year smoking history. He has never used smokeless tobacco. He reports that he does not drink alcohol or use illicit drugs.  Allergies: No Known Allergies  Medications:                                                                                                                           I have reviewed the patient's current medications.  ROS:                                                                                                                                       History obtained from the patient and chart review  General ROS: negative for - chills, fatigue, fever, night sweats, weight gain or weight loss Psychological ROS: negative for - behavioral disorder, hallucinations, memory difficulties, mood swings or suicidal ideation Ophthalmic ROS: negative for - blurry vision, double vision, eye pain or loss of vision ENT ROS: negative for - epistaxis, nasal discharge, oral lesions, sore throat, tinnitus or vertigo Allergy and Immunology ROS: negative for - hives or itchy/watery eyes Hematological and Lymphatic ROS: negative for - bleeding  problems, bruising or swollen lymph nodes Endocrine ROS: negative for - galactorrhea, hair pattern changes, polydipsia/polyuria or temperature intolerance Respiratory ROS: negative for - cough, hemoptysis, shortness of breath or wheezing Cardiovascular ROS: negative for - chest pain, dyspnea on exertion, edema or irregular heartbeat Gastrointestinal ROS: negative for - abdominal pain, diarrhea, hematemesis, nausea/vomiting or stool incontinence Genito-Urinary ROS: negative for - dysuria, hematuria, incontinence or urinary frequency/urgency Musculoskeletal ROS: negative for - joint swelling or muscular weakness Neurological ROS: as noted in HPI Dermatological ROS: negative for rash and skin lesion changes  Physical exam: pleasant male in mild distress due to chest pain. Blood pressure 130/72, pulse 67, temperature 98.2 F (36.8 C), temperature source Oral, resp. rate 18, height '5\' 10"'  (1.778 m), weight 91.627 kg (202 lb), SpO2 100.00%. Head: normocephalic. Neck: supple, no bruits, no JVD. Cardiac: no murmurs. Lungs: clear. Abdomen: soft, no tender, no mass. Extremities: no edema. CV: pulses palpable throughout  Neurologic Examination:  Mental Status: Alert, oriented, thought content appropriate.  Speech fluent without evidence of aphasia.  Able to follow 3 step commands without difficulty. Cranial Nerves: II: Discs flat bilaterally; Visual fields grossly normal, pupils equal, round, reactive to light and accommodation III,IV, VI: ptosis not present, extra-ocular motions intact bilaterally V,VII: smile symmetric, facial light touch sensation normal bilaterally VIII: hearing normal bilaterally IX,X: gag reflex present XI: bilateral shoulder shrug XII: midline tongue extension without atrophy or fasciculations Motor: Mild right sided weakness, leg greater than arm BUT SOME CONCERNS ABOUT  FUNCTIONAL FINDINGS ON TESTING. Tone and bulk:normal tone throughout; no atrophy noted Sensory: Pinprick and light touch intact throughout, bilaterally Deep Tendon Reflexes:  2 all over Gait: no tested      Results for orders placed during the hospital encounter of 12/27/13 (from the past 48 hour(s))  ETHANOL     Status: None   Collection Time    12/27/13 12:35 PM      Result Value Ref Range   Alcohol, Ethyl (B) <11  0 - 11 mg/dL   Comment:            LOWEST DETECTABLE LIMIT FOR     SERUM ALCOHOL IS 11 mg/dL     FOR MEDICAL PURPOSES ONLY  PROTIME-INR     Status: None   Collection Time    12/27/13 12:35 PM      Result Value Ref Range   Prothrombin Time 12.7  11.6 - 15.2 seconds   INR 0.97  0.00 - 1.49  APTT     Status: None   Collection Time    12/27/13 12:35 PM      Result Value Ref Range   aPTT 24  24 - 37 seconds  CBC     Status: None   Collection Time    12/27/13 12:35 PM      Result Value Ref Range   WBC 8.2  4.0 - 10.5 K/uL   RBC 4.85  4.22 - 5.81 MIL/uL   Hemoglobin 14.7  13.0 - 17.0 g/dL   HCT 42.1  39.0 - 52.0 %   MCV 86.8  78.0 - 100.0 fL   MCH 30.3  26.0 - 34.0 pg   MCHC 34.9  30.0 - 36.0 g/dL   RDW 14.7  11.5 - 15.5 %   Platelets 178  150 - 400 K/uL  DIFFERENTIAL     Status: Abnormal   Collection Time    12/27/13 12:35 PM      Result Value Ref Range   Neutrophils Relative % 65  43 - 77 %   Neutro Abs 5.4  1.7 - 7.7 K/uL   Lymphocytes Relative 16  12 - 46 %   Lymphs Abs 1.3  0.7 - 4.0 K/uL   Monocytes Relative 11  3 - 12 %   Monocytes Absolute 0.9  0.1 - 1.0 K/uL   Eosinophils Relative 7 (*) 0 - 5 %   Eosinophils Absolute 0.6  0.0 - 0.7 K/uL   Basophils Relative 1  0 - 1 %   Basophils Absolute 0.1  0.0 - 0.1 K/uL  COMPREHENSIVE METABOLIC PANEL     Status: Abnormal   Collection Time    12/27/13 12:35 PM      Result Value Ref Range   Sodium 136 (*) 137 - 147 mEq/L   Potassium 4.1  3.7 - 5.3 mEq/L   Chloride 99  96 - 112 mEq/L   CO2 24  19 - 32  mEq/L  Glucose, Bld 76  70 - 99 mg/dL   BUN 12  6 - 23 mg/dL   Creatinine, Ser 0.92  0.50 - 1.35 mg/dL   Calcium 8.7  8.4 - 10.5 mg/dL   Total Protein 6.8  6.0 - 8.3 g/dL   Albumin 3.7  3.5 - 5.2 g/dL   AST 24  0 - 37 U/L   ALT 19  0 - 53 U/L   Alkaline Phosphatase 88  39 - 117 U/L   Total Bilirubin 0.4  0.3 - 1.2 mg/dL   GFR calc non Af Amer 90 (*) >90 mL/min   GFR calc Af Amer >90  >90 mL/min   Comment: (NOTE)     The eGFR has been calculated using the CKD EPI equation.     This calculation has not been validated in all clinical situations.     eGFR's persistently <90 mL/min signify possible Chronic Kidney     Disease.  Randolm Idol, ED     Status: None   Collection Time    12/27/13 12:41 PM      Result Value Ref Range   Troponin i, poc 0.00  0.00 - 0.08 ng/mL   Comment 3            Comment: Due to the release kinetics of cTnI,     a negative result within the first hours     of the onset of symptoms does not rule out     myocardial infarction with certainty.     If myocardial infarction is still suspected,     repeat the test at appropriate intervals.  I-STAT CHEM 8, ED     Status: Abnormal   Collection Time    12/27/13 12:42 PM      Result Value Ref Range   Sodium 138  137 - 147 mEq/L   Potassium 4.0  3.7 - 5.3 mEq/L   Chloride 101  96 - 112 mEq/L   BUN 11  6 - 23 mg/dL   Creatinine, Ser 1.00  0.50 - 1.35 mg/dL   Glucose, Bld 77  70 - 99 mg/dL   Calcium, Ion 1.10 (*) 1.13 - 1.30 mmol/L   TCO2 23  0 - 100 mmol/L   Hemoglobin 14.6  13.0 - 17.0 g/dL   HCT 43.0  39.0 - 52.0 %  CBG MONITORING, ED     Status: None   Collection Time    12/27/13  1:20 PM      Result Value Ref Range   Glucose-Capillary 72  70 - 99 mg/dL   Ct Head Wo Contrast  12/27/2013   CLINICAL DATA:  Right lower extremity weakness of acute onset  EXAM: CT HEAD WITHOUT CONTRAST  TECHNIQUE: Contiguous axial images were obtained from the base of the skull through the vertex without intravenous  contrast. Study was obtained within 24 hr of patient's arrival at the emergency department.  COMPARISON:  September 08, 2007  FINDINGS: The ventricles are normal in size and configuration. There is no appreciable mass, hemorrhage, extra-axial fluid collection, or midline shift. There is subtle decreased attenuation in the lower right mid brain and upper right pons, concerning for recent infarct in this area. Elsewhere, gray-white compartments appear normal. There is mild stable increased attenuation in both middle cerebral arteries, a stable symmetric finding of doubtful significance.  Bony calvarium appears intact. The mastoid air cells are clear. There is mucosal thickening in both maxillary antra as well as extensive ethmoid sinus disease bilaterally. There  is moderate mucosal thickening in the inferior frontal sinuses bilaterally as well as in the left and right sphenoid sinuses.  IMPRESSION: Question recent infarct in the lower right mid brain and upper right pons. Elsewhere gray-white compartments appear normal. No acute hemorrhage or mass. Multifocal paranasal sinus disease, most pronounced in the ethmoid complexes bilaterally.  Critical Value/emergent results were called by telephone at the time of interpretation on 12/27/2013 at 12:52 PM to Dr. Aram Beecham, neurology3, who verbally acknowledged these results.   Electronically Signed   By: Lowella Grip M.D.   On: 12/27/2013 12:56    Assessment: 63 y.o. male with multiple risk factors for stroke brought in with acute onset right sided weakness, right face weakness, and chest pain. NIHSS 4 but some concerns about a potential functional component. CT with possible hypodensity in the right pons/midbrain (he had vertigo on presentation but doesn't have a cross neurological syndrome to make feel strongly about a brainstem stroke) Nevertheless, he has several risk factors for stroke and if the right LE weakness is real it will probably be potentially disabling to  the patient, and thus I decided to go ahead and administer IV tpa. There was a delay in initiating IV tpa as patient had chest pain concerning for aortic dissection or PE and he had to undergo CTA dissection/PE protocol before making a decision about IV thrombolysis. Stroke team will follow up in am.    Stroke Risk Factors - HTN, hyperlipidemia, CAD, stroke.   Plan: 1. HgbA1c, fasting lipid panel 2. MRI, MRA  of the brain without contrast 3. Echocardiogram 4. Carotid dopplers 5. Prophylactic therapy-aspirin as per post IV tpa protocol 6. Risk factor modification 7. Telemetry monitoring 8. Frequent neuro checks 9. PT/OT SLP   Dorian Pod, MD [facility] [phone]  12/27/2013, 1:34 PM

## 2013-12-27 NOTE — ED Notes (Signed)
[place] [facility] for code stroke and chest pain, pt reports onset of weakness on right side this am at 1030 and when EMS arrived he began complaining of chest pain, pt was given 4 mg of morphine and chest pain dropped to 8 from 10, pt complained of being dizzy and pale at home prior to calling to EMS. NIH with stroke team 4, EKG done at bedside in CT.

## 2013-12-28 ENCOUNTER — Inpatient Hospital Stay (HOSPITAL_COMMUNITY): Payer: BC Managed Care – PPO

## 2013-12-28 ENCOUNTER — Encounter (HOSPITAL_COMMUNITY): Payer: Self-pay | Admitting: *Deleted

## 2013-12-28 DIAGNOSIS — I10 Essential (primary) hypertension: Secondary | ICD-10-CM | POA: Diagnosis present

## 2013-12-28 DIAGNOSIS — I6350 Cerebral infarction due to unspecified occlusion or stenosis of unspecified cerebral artery: Secondary | ICD-10-CM

## 2013-12-28 DIAGNOSIS — I517 Cardiomegaly: Secondary | ICD-10-CM

## 2013-12-28 DIAGNOSIS — E785 Hyperlipidemia, unspecified: Secondary | ICD-10-CM | POA: Diagnosis present

## 2013-12-28 LAB — LIPID PANEL
CHOLESTEROL: 151 mg/dL (ref 0–200)
HDL: 40 mg/dL (ref >39)
LDL CALC: 85 mg/dL (ref 0–99)
TRIGLYCERIDES: 131 mg/dL (ref <150)
Total CHOL/HDL Ratio: 3.8 RATIO
VLDL: 26 mg/dL (ref 0–40)

## 2013-12-28 LAB — HEMOGLOBIN A1C
HEMOGLOBIN A1C: 5.4 % (ref <5.7)
MEAN PLASMA GLUCOSE: 108 mg/dL (ref <117)

## 2013-12-28 MED ORDER — OMEGA-3 FATTY ACIDS 1000 MG PO CAPS: 1.0000 g | ORAL_CAPSULE | Freq: Two times a day (BID) | ORAL | Status: DC

## 2013-12-28 MED ORDER — PAROXETINE HCL 20 MG PO TABS
20.0000 mg | ORAL_TABLET | Freq: Every day | ORAL | Status: DC
  Administered 2013-12-28 – 2013-12-29 (×2): 20 mg via ORAL
  Filled 2013-12-28 (×2): qty 1

## 2013-12-28 MED ORDER — FAMOTIDINE 20 MG PO TABS
20.0000 mg | ORAL_TABLET | Freq: Two times a day (BID) | ORAL | Status: DC
  Administered 2013-12-28 – 2013-12-29 (×3): 20 mg via ORAL
  Filled 2013-12-28 (×4): qty 1

## 2013-12-28 MED ORDER — OXYCODONE-ACETAMINOPHEN 5-325 MG PO TABS
1.0000 | ORAL_TABLET | Freq: Four times a day (QID) | ORAL | Status: DC | PRN
  Administered 2013-12-28 – 2013-12-29 (×4): 1 via ORAL
  Filled 2013-12-28 (×4): qty 1

## 2013-12-28 MED ORDER — ASPIRIN EC 325 MG PO TBEC
325.0000 mg | DELAYED_RELEASE_TABLET | Freq: Every day | ORAL | Status: DC
  Administered 2013-12-28 – 2013-12-29 (×2): 325 mg via ORAL
  Filled 2013-12-28 (×2): qty 1

## 2013-12-28 MED ORDER — LISINOPRIL 5 MG PO TABS
5.0000 mg | ORAL_TABLET | Freq: Every day | ORAL | Status: DC
  Administered 2013-12-28 – 2013-12-29 (×2): 5 mg via ORAL
  Filled 2013-12-28 (×2): qty 1

## 2013-12-28 MED ORDER — CLOPIDOGREL BISULFATE 75 MG PO TABS
75.0000 mg | ORAL_TABLET | Freq: Every day | ORAL | Status: DC
  Administered 2013-12-28 – 2013-12-29 (×2): 75 mg via ORAL
  Filled 2013-12-28 (×2): qty 1

## 2013-12-28 MED ORDER — IOHEXOL 350 MG/ML SOLN
50.0000 mL | Freq: Once | INTRAVENOUS | Status: AC | PRN
  Administered 2013-12-28: 50 mL via INTRAVENOUS

## 2013-12-28 MED ORDER — OMEGA-3-ACID ETHYL ESTERS 1 G PO CAPS
1.0000 g | ORAL_CAPSULE | Freq: Two times a day (BID) | ORAL | Status: DC
  Administered 2013-12-28 – 2013-12-29 (×2): 1 g via ORAL
  Filled 2013-12-28 (×3): qty 1

## 2013-12-28 NOTE — Progress Notes (Signed)
  Echocardiogram 2D Echocardiogram has been performed.  Justin Wells 12/28/2013, 10:13 AM

## 2013-12-28 NOTE — Progress Notes (Signed)
Pt c/o h/a, feeling dizzy and "sweaty". Vital signs stable, neuro exam unchanged. Called to expidite CT scan. Cont continuous monitoring.

## 2013-12-28 NOTE — Evaluation (Signed)
Occupational Therapy Evaluation Patient Details Name: Justin Wells MRN: [id number] DOB: 06-18-1951 Today's Date: 12/28/2013    History of Present Illness 63 y.o. male hx of MI, HL, reflux, strokes here with chest pain, weakness. Chest pain around 8am this morning. Chest pain radiated to his back. Sharp pain. Has some shortness of breath as well. He then had some right sided weakness but he is unclear when it started exactly.  MRI negative for acute infarct and MRA negative for occlusion.   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 Pt will benefit from acute OT with follow up on CIR to get him back to independence and gainful employment in the shortest time possible.    Follow Up Recommendations  CIR    Equipment Recommendations  Tub/shower seat       Precautions / Restrictions Precautions Precautions: Fall Restrictions Weight Bearing Restrictions: No      Mobility Bed Mobility               General bed mobility comments: Up in recliner upon arrival  Transfers Overall transfer level: Needs assistance   Transfers: Sit to/from Stand Sit to Stand: Min guard              Balance Overall balance assessment: Needs assistance Sitting-balance support: No upper extremity supported;Feet supported Sitting balance-Leahy Scale: Good     Standing balance support: No upper extremity supported Standing balance-Leahy Scale: Poor (dyanmically, pt feels like is right leg may give out on him when he weight shifts onto it)                              ADL Overall ADL's : Needs assistance/impaired Eating/Feeding: Independent;Sitting   Grooming: Set up;Sitting   Upper Body Bathing: Set up;Sitting   Lower Body Bathing: Minimal assistance;Sit  to/from stand   Upper Body Dressing : Set up;Sitting   Lower Body Dressing: Minimal assistance;Sit to/from stand   Toilet Transfer: Minimal assistance;Ambulation   Toileting- Clothing Manipulation and Hygiene: Minimal assistance       Functional mobility during ADLs: Minimal assistance       Vision Eye Alignment: Within Functional Limits Alignment/Gaze Preference: Within Defined Limits Ocular Range of Motion: Within Functional Limits Tracking/Visual Pursuits: Able to track stimulus in all quads without difficulty Saccades: Within functional limits Convergence: Within functional limits                Pertinent Vitals/Pain 4/10 headache (anterior); RN aware     Hand Dominance Right   Extremity/Trunk Assessment Upper Extremity Assessment Upper Extremity Assessment: RUE deficits/detail RUE Deficits / Details: Dominant arm, 4/5 with MMT, reports tingling as like arm as fallen asleep and is waking up, says he has dropped objects as of late but not today RUE Sensation: decreased light touch (mildly different per pt)   Lower Extremity Assessment Lower Extremity Assessment: Defer to PT evaluation       Communication Communication Communication: No difficulties   Cognition Arousal/Alertness: Awake/alert Behavior During Therapy: WFL for tasks assessed/performed Overall Cognitive Status: Within Functional Limits for tasks assessed  Home Living Family/patient expects to be discharged to:: Private residence Living Arrangements: Spouse/significant other;Children Available Help at Discharge: Family;Available 24 hours/day Type of Home: House             Bathroom Shower/Tub: Chief Strategy OfficerTub/shower unit   Bathroom Toilet: Standard     Home Equipment: None   Additional Comments: works as a Programmer, applicationswelder      Prior Functioning/Environment Level of Independence: Independent             OT Diagnosis: Generalized weakness;Hemiplegia  non-dominant side   OT Problem List: Decreased strength;Impaired balance (sitting and/or standing);Impaired sensation;Decreased knowledge of use of DME or AE   OT Treatment/Interventions: Self-care/ADL training;Patient/family education;Balance training;Therapeutic activities;Therapeutic exercise;DME and/or AE instruction    OT Goals(Current goals can be found in the care plan section) Acute Rehab OT Goals Patient Stated Goal: Back to work OT Goal Formulation: With patient Time For Goal Achievement: 01/04/14 Potential to Achieve Goals: Good  OT Frequency: Min 3X/week              End of Session Nurse Communication:  (Feet are cold and bluish)  Activity Tolerance: Patient tolerated treatment well Patient left: in chair;with call bell/phone within reach   Time: 1417-1431 OT Time Calculation (min): 14 min Charges:  OT General Charges $OT Visit: 1 Procedure OT Evaluation $Initial OT Evaluation Tier I: 1 Procedure OT Treatments $Self Care/Home Management : 8-22 mins  Evette GeorgesCatherine Eva Leveda Kendrix [phone] 12/28/2013, 2:53 PM

## 2013-12-28 NOTE — Evaluation (Signed)
Physical Therapy Evaluation Patient Details Name: Justin Wells MRN: [id number] DOB: [birth year] Today's Date: 12/28/2013   History of Present Illness  63 y.o. male hx of MI, HL, reflux, strokes here with chest pain, weakness. Chest pain around 8am this morning. Chest pain radiated to his back. Sharp pain. Has some shortness of breath as well. He then had some right sided weakness but he is unclear when it started exactly.  MRI negative for acute infarct and MRA negative for occlusion.  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 Pt now with R sided weakness and requires assist for safe transfers and use of RW for safe ambulation. Pt to benefit from CIR for aggressive rehab to achieve mod I function and to allow for quicker return to work. Pt with good home set up and support.    Follow Up Recommendations CIR    Equipment Recommendations  Rolling walker with 5" wheels    Recommendations for Other Services Rehab consult     Precautions / Restrictions Precautions Precautions: Fall Restrictions Weight Bearing Restrictions: No      Mobility  Bed Mobility Overal bed mobility: Modified Independent             General bed mobility comments: increased time, HOB elevated, definite use of hands  Transfers Overall transfer level: Needs assistance Equipment used: 1 person hand held assist Transfers: Sit to/from Stand Sit to Stand: Min assist         General transfer comment: pt unsteady, guarded due to report "I feel like my R leg is going to give out"  Ambulation/Gait Ambulation/Gait assistance: Min assist Ambulation Distance (Feet): 60 Feet Assistive device: Rolling walker (2 wheeled);1 person hand held assist Gait Pattern/deviations: Step-to pattern;Decreased stride length;Antalgic Gait velocity: slow and guarded   General Gait Details: pt with report "I feel like my R leg is going to give out one me." Pt very cautious with 1 person HHA. pt with improved  step length with RW however dependent on UEs. Attempted to have patient relax bilat UEs however pt with R sided weakness.  Stairs            Wheelchair Mobility    Modified Rankin (Stroke Patients Only)       Balance Overall balance assessment: Needs assistance Sitting-balance support: No upper extremity supported Sitting balance-Leahy Scale: Good     Standing balance support: No upper extremity supported Standing balance-Leahy Scale: Poor (dyanmically, pt feels like is right leg may give out on him when he weight shifts onto it)                               Pertinent Vitals/Pain 8/10 headache    Home Living Family/patient expects to be discharged to:: Private residence Living Arrangements: Spouse/significant other;Children Available Help at Discharge: Family;Available 24 hours/day Type of Home: House Home Access: Stairs to enter Entrance Stairs-Rails: Right Entrance Stairs-Number of Steps: 3 Home Layout: One level Home Equipment: None Additional Comments: works as a Chiropractorwelder    Prior Function Level of Independence: Independent               Hand Dominance   Dominant Hand: Right    Extremity/Trunk Assessment   Upper Extremity Assessment: RUE deficits/detail RUE Deficits / Details: R UE drift, grossly 4-/5   RUE Sensation: decreased light touch (mildly different per pt)     Lower Extremity Assessment: RLE deficits/detail RLE Deficits /  Details: grossly 4-/5       Communication   Communication: No difficulties  Cognition Arousal/Alertness: Awake/alert Behavior During Therapy: WFL for tasks assessed/performed Overall Cognitive Status: Within Functional Limits for tasks assessed                      General Comments      Exercises        Assessment/Plan    PT Assessment Patient needs continued PT services  PT Diagnosis Difficulty walking;Acute pain   PT Problem List Decreased strength;Decreased range of  motion;Decreased activity tolerance;Decreased balance;Decreased mobility  PT Treatment Interventions DME instruction;Gait training;Stair training;Functional mobility training;Therapeutic activities;Therapeutic exercise   PT Goals (Current goals can be found in the Care Plan section) Acute Rehab PT Goals Patient Stated Goal: Back to work PT Goal Formulation: With patient Time For Goal Achievement: 01/04/14 Potential to Achieve Goals: Good    Frequency Min 4X/week   Barriers to discharge        Co-evaluation               End of Session Equipment Utilized During Treatment: Gait belt Activity Tolerance: Patient tolerated treatment well Patient left: in chair;with call bell/phone within reach;with nursing/sitter in room Nurse Communication: Mobility status         Time: [id number] PT Time Calculation (min): 28 min   Charges:   PT Evaluation $Initial PT Evaluation Tier I: 1 Procedure PT Treatments $Gait Training: 8-22 mins   PT G CodesIona Hansen:          [name] 12/28/2013, 3:23 PM  Lewis ShockAshly Toini Failla, PT, DPT Pager #: [phone] Office #: [phone]

## 2013-12-28 NOTE — Progress Notes (Signed)
Rehab Admissions Coordinator Note:  Patient was screened by Clois DupesBarbara Godwin Aasir Daigler for appropriateness for an Inpatient Acute Rehab Consult per PT and OT recommendation.  At this time, we are recommending Inpatient Rehab consult. I will order.  Foye SpurlingBarbara Godwin [facility] 12/28/2013, 3:45 PM  I can be reached at [phone].

## 2013-12-28 NOTE — Progress Notes (Signed)
Stroke Team Progress Note  HISTORY Justin Wells is an 63 y.o. male with a past medical history significant for HTN, hyperlipidemia, CAD s/p stent deployment x 3, MI, PAD s/p femoral stenting, TIA's, ischemic stroke in the setting of acute MI 2014, brought to [facility] ED by ambulance as a code stroke due to acute onset of right hemiparesis, right facial droop 12/27/2013 at 1100. He aid that he was home sitting in a couch when started feeling weak all over, developed severe dizziness " like room spinning" and then ambulance was called. According to patient and EMS, weakness of the right arm-leg and face as well as chest pain developed while in the truck. No double vision, difficulty swallowing, slurred speech, language or vision impairment. Chest pain radiated to his shoulders and back. NIHSS 4, BUT WITH SOME INCONSISTENCIES (1 for sensory, 1 for right arm weakness, 2 for right arm weakness). CT brain showed a subtle decreased attenuation in the lower right mid brain and upper right pons, concerning for acute ischemia in this area. Patient underwent CTA chest that showed no evidence of dissection or PE. He takes plavix daily.   Patient was administered TPA; it was delayed due to CP and concern for aortic dissection/PE. He was admitted to the neuro ICU for further evaluation and treatment.  SUBJECTIVE His RN is at the bedside. No family present. Overall he feels his condition is rapidly improving. Wife and family arrived during rounds. Wife reports recurrently "spells" where pt gets hot, then develops neurologic symptoms. It takes over 24 h for symptoms to resolve. Wife reports cardiologist at [facility] told them that he had a blockage in the back of his head.  OBJECTIVE Most recent Vital Signs: Filed Vitals:   12/28/13 0600 12/28/13 0700 12/28/13 0800 12/28/13 0815  BP: 124/78 110/72 122/69   Pulse: 61 63 78   Temp:    97.6 F (36.4 C)  TempSrc:    Oral  Resp: 11 15 17    Height:      Weight:       SpO2: 97% 98% 97%    CBG (last 3)   Recent Labs  12/27/13 1320  GLUCAP 72    IV Fluid Intake:   . sodium chloride 75 mL/hr at 12/27/13 1800    MEDICATIONS  . pantoprazole (PROTONIX) IV  40 mg Intravenous QHS   PRN:  acetaminophen, acetaminophen, labetalol, senna-docusate  Diet:  NPO  Activity:  Bedrest DVT Prophylaxis:  SCDs   CLINICALLY SIGNIFICANT STUDIES Basic Metabolic Panel:   Recent Labs Lab 12/27/13 1235 12/27/13 1242  NA 136* 138  K 4.1 4.0  CL 99 101  CO2 24  --   GLUCOSE 76 77  BUN 12 11  CREATININE 0.92 1.00  CALCIUM 8.7  --    Liver Function Tests:   Recent Labs Lab 12/27/13 1235  AST 24  ALT 19  ALKPHOS 88  BILITOT 0.4  PROT 6.8  ALBUMIN 3.7   CBC:   Recent Labs Lab 12/27/13 1235 12/27/13 1242  WBC 8.2  --   NEUTROABS 5.4  --   HGB 14.7 14.6  HCT 42.1 43.0  MCV 86.8  --   PLT 178  --    Coagulation:   Recent Labs Lab 12/27/13 1235  LABPROT 12.7  INR 0.97   Cardiac Enzymes: No results found for this basename: CKTOTAL, CKMB, CKMBINDEX, TROPONINI,  in the last 168 hours Urinalysis:   Recent Labs Lab 12/27/13 1447  COLORURINE YELLOW  LABSPEC  1.024  PHURINE 6.0  GLUCOSEU NEGATIVE  HGBUR NEGATIVE  BILIRUBINUR NEGATIVE  KETONESUR NEGATIVE  PROTEINUR NEGATIVE  UROBILINOGEN 1.0  NITRITE NEGATIVE  LEUKOCYTESUR NEGATIVE   Lipid Panel    Component Value Date/Time   CHOL 151 12/28/2013 0238   TRIG 131 12/28/2013 0238   HDL 40 12/28/2013 0238   CHOLHDL 3.8 12/28/2013 0238   VLDL 26 12/28/2013 0238   LDLCALC 85 12/28/2013 0238   HgbA1C  Lab Results  Component Value Date   HGBA1C  Value: 5.1 (NOTE)   The ADA recommends the following therapeutic goals for glycemic   control related to Hgb A1C measurement:   Goal of Therapy:   < 7.0% Hgb A1C   Action Suggested:  > 8.0% Hgb A1C   Ref:  Diabetes Care, 22, Suppl. 1, 1999 11/03/2007    Urine Drug Screen:     Component Value Date/Time   LABOPIA POSITIVE* 12/27/2013 1447    COCAINSCRNUR NONE DETECTED 12/27/2013 1447   LABBENZ NONE DETECTED 12/27/2013 1447   AMPHETMU NONE DETECTED 12/27/2013 1447   THCU NONE DETECTED 12/27/2013 1447   LABBARB NONE DETECTED 12/27/2013 1447    Alcohol Level:   Recent Labs Lab 12/27/13 1235  ETH <11    CT of the brain  12/27/2013    Question recent infarct in the lower right mid brain and upper right pons. Elsewhere gray-white compartments appear normal. No acute hemorrhage or mass. Multifocal paranasal sinus disease, most pronounced in the ethmoid complexes bilaterally.    MRI of the brain  12/27/2013      No acute infarct.  Very mild nonspecific white matter type changes probably related to result of small vessel disease.  Mild to moderate paranasal sinus mucosal thickening with opacification of majority of ethmoid sinus air cells.    MRA of the brain  12/27/2013    Mild branch vessel irregularity   CT angio head   CT angio neck    2D Echocardiogram    Carotid Doppler    CXR  12/27/2013    No active disease.  CT Angio Chest, Abd and Pelvis 12/27/2013    Scattered atherosclerotic disease changes without evidence of aneurysm or dissection.  Small hiatal hernia.  Single minimally enlarged nonspecific LEFT hilar lymph node.  Cholelithiasis.  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  BILATERAL inguinal hernias containing fat.  Distal colonic diverticulosis without evidence of diverticulitis.    EKG  normal sinus rhythm. For complete results please see formal report.   Historical Tests done in [place] MRA neck 07/19/2009 Unremarkable MRA of the neck. MRA neck 11/24/2007 Unremarkable MRA of the carotids MRI brain 07/19/2009 Normal brain MRI and Circle of Willis MRA. MRI brain 03/06/2008 1. No evidence of acute infarction.  2. Proximal left M1 stenosis. MRI brain 11/24/2007 Normal brain MR examination.  Therapy Recommendations   Physical Exam   Middle aged Caucasian male not in distress.Awake alert. Afebrile. Head  is nontraumatic. Neck is supple without bruit. Hearing is normal. Cardiac exam no murmur or gallop. Lungs are clear to auscultation. Distal pulses are well felt. Neurological Exam : Awake alert oriented x3 with normal speech and language function. Extraocular movements are full range without nystagmus. Fundi were not visualized. Vision acuity seems adequate. Face is symmetric without weakness. Tongue is midline. Mild right upper and lower extremity drift but I doubt patient deferred as this appears variable. Mild weakness of the right grip and intrinsic hand muscles but again poor effort. Intact  touch pin prick sensation. Finger-to-nose and knee to heel coordination is slow but accurate. Gait was not tested. ASSESSMENT Justin Wells is a 63 y.o. male presenting with acute onset right hemiparesis. Status post IV t-PA 12/27/2013 at 1326. Imaging confirms no acute infarct. Dx: left brain posterior circulation small infarct not visualized on MRI versus TIA. Given history of sweating episodes w/ neuro changes that last 24h, concern for posterior circulation TIAs in past. Review of his old records reveal similar episode in 2009 with MRA of the neck showing no significant extracranial stenosis.  On aspirin 325 mg orally every day and clopidogrel 75 mg orally every day prior to admission. Now on no antithrombotics as within 24h of tPA for secondary stroke prevention. Patient with no resultant neurologic deficits.. Stroke work up underway.   hypertension  Hyperlipidemia, LDL 85, on fish oil PTA, now on no home meds, at goal LDL < 100   CAD - MI, stent 2009  Hx stroke 2009, 2010 - right hemiparesis face/arm/leg  PAD - right femoral artery stent 2014  Hx seizures 2009  Lap banding 2010  Hospital day # 1  TREATMENT/PLAN  OOB. Therapy evals  Resume home medications  F/u CTA head and neck at 24h  Cancel ST swallow assessment; passed repeat RN swallow eval  Continue aspirin 325 mg orally every  day and clopidogrel 75 mg orally every day for secondary stroke prevention once imaging confirms no post tPA hemorrhage.  F/u carotid doppler and 2D echo results, HgbA1c  Resume home medications  Transfer to the floor vs possible d/c later today  Annie MainSharon Biby, MSN, RN, ANVP-BC, AGPCNP-BC [facility] Pager: [phone] 12/28/2013 9:01 AM  I have personally obtained a history, examined the patient, evaluated imaging results, and formulated the assessment and plan of care. I agree with the above. This patient is critically ill and at significant risk of neurological worsening, death and care requires constant monitoring of vital signs, hemodynamics,respiratory and cardiac monitoring,review of multiple databases, neurological assessment, discussion with family, other specialists and medical decision making of high complexity. I spent 30 minutes of neurocritical care time  in the care of  this patient. Delia HeadyPramod Pepper Wyndham, MD  To contact Stroke Continuity provider, please refer to [email or web address]. After hours, contact General Neurology

## 2013-12-28 NOTE — Progress Notes (Signed)
UR completed.  Quiana Cobaugh, RN BSN [facility] CCM Trauma/Neuro ICU Case Manager [phone]  

## 2013-12-29 ENCOUNTER — Encounter (HOSPITAL_COMMUNITY): Payer: Self-pay | Admitting: *Deleted

## 2013-12-29 ENCOUNTER — Inpatient Hospital Stay (HOSPITAL_COMMUNITY)
Admission: RE | Admit: 2013-12-29 | Discharge: 2014-01-01 | DRG: 945 | Disposition: A | Discharge status: Home / Self Care | Payer: BC Managed Care – PPO | Source: Intra-hospital | Attending: Physical Medicine & Rehabilitation | Admitting: Physical Medicine & Rehabilitation

## 2013-12-29 DIAGNOSIS — I2510 Atherosclerotic heart disease of native coronary artery without angina pectoris: Secondary | ICD-10-CM | POA: Diagnosis present

## 2013-12-29 DIAGNOSIS — F329 Major depressive disorder, single episode, unspecified: Secondary | ICD-10-CM | POA: Diagnosis present

## 2013-12-29 DIAGNOSIS — Z7982 Long term (current) use of aspirin: Secondary | ICD-10-CM

## 2013-12-29 DIAGNOSIS — Z8673 Personal history of transient ischemic attack (TIA), and cerebral infarction without residual deficits: Secondary | ICD-10-CM

## 2013-12-29 DIAGNOSIS — R29810 Facial weakness: Secondary | ICD-10-CM | POA: Diagnosis present

## 2013-12-29 DIAGNOSIS — Z5189 Encounter for other specified aftercare: Principal | ICD-10-CM

## 2013-12-29 DIAGNOSIS — Z8249 Family history of ischemic heart disease and other diseases of the circulatory system: Secondary | ICD-10-CM

## 2013-12-29 DIAGNOSIS — J40 Bronchitis, not specified as acute or chronic: Secondary | ICD-10-CM | POA: Diagnosis present

## 2013-12-29 DIAGNOSIS — R29898 Other symptoms and signs involving the musculoskeletal system: Secondary | ICD-10-CM | POA: Diagnosis present

## 2013-12-29 DIAGNOSIS — I639 Cerebral infarction, unspecified: Secondary | ICD-10-CM | POA: Diagnosis present

## 2013-12-29 DIAGNOSIS — I6330 Cerebral infarction due to thrombosis of unspecified cerebral artery: Secondary | ICD-10-CM

## 2013-12-29 DIAGNOSIS — F3289 Other specified depressive episodes: Secondary | ICD-10-CM | POA: Diagnosis present

## 2013-12-29 DIAGNOSIS — Z7902 Long term (current) use of antithrombotics/antiplatelets: Secondary | ICD-10-CM

## 2013-12-29 DIAGNOSIS — R4789 Other speech disturbances: Secondary | ICD-10-CM | POA: Diagnosis present

## 2013-12-29 DIAGNOSIS — Z9884 Bariatric surgery status: Secondary | ICD-10-CM

## 2013-12-29 DIAGNOSIS — K219 Gastro-esophageal reflux disease without esophagitis: Secondary | ICD-10-CM | POA: Diagnosis present

## 2013-12-29 DIAGNOSIS — I10 Essential (primary) hypertension: Secondary | ICD-10-CM | POA: Diagnosis present

## 2013-12-29 DIAGNOSIS — I739 Peripheral vascular disease, unspecified: Secondary | ICD-10-CM

## 2013-12-29 DIAGNOSIS — Z79899 Other long term (current) drug therapy: Secondary | ICD-10-CM

## 2013-12-29 DIAGNOSIS — Z9861 Coronary angioplasty status: Secondary | ICD-10-CM

## 2013-12-29 DIAGNOSIS — E785 Hyperlipidemia, unspecified: Secondary | ICD-10-CM | POA: Diagnosis present

## 2013-12-29 DIAGNOSIS — Z87891 Personal history of nicotine dependence: Secondary | ICD-10-CM

## 2013-12-29 DIAGNOSIS — I252 Old myocardial infarction: Secondary | ICD-10-CM

## 2013-12-29 MED ORDER — OMEGA-3-ACID ETHYL ESTERS 1 G PO CAPS
1.0000 g | ORAL_CAPSULE | Freq: Two times a day (BID) | ORAL | Status: DC
  Administered 2013-12-29 – 2014-01-01 (×6): 1 g via ORAL
  Filled 2013-12-29 (×8): qty 1

## 2013-12-29 MED ORDER — SENNOSIDES-DOCUSATE SODIUM 8.6-50 MG PO TABS: 1.0000 | ORAL_TABLET | Freq: Every evening | ORAL | Status: DC | PRN

## 2013-12-29 MED ORDER — PAROXETINE HCL 20 MG PO TABS
20.0000 mg | ORAL_TABLET | Freq: Every day | ORAL | Status: DC
  Administered 2013-12-30 – 2014-01-01 (×3): 20 mg via ORAL
  Filled 2013-12-29 (×4): qty 1

## 2013-12-29 MED ORDER — OXYCODONE-ACETAMINOPHEN 5-325 MG PO TABS
1.0000 | ORAL_TABLET | Freq: Four times a day (QID) | ORAL | Status: DC | PRN
  Administered 2013-12-29 – 2013-12-31 (×5): 1 via ORAL
  Filled 2013-12-29 (×5): qty 1

## 2013-12-29 MED ORDER — ONDANSETRON HCL 4 MG/2ML IJ SOLN: 4.0000 mg | Freq: Four times a day (QID) | INTRAMUSCULAR | Status: DC | PRN

## 2013-12-29 MED ORDER — ONDANSETRON HCL 4 MG PO TABS: 4.0000 mg | ORAL_TABLET | Freq: Four times a day (QID) | ORAL | Status: DC | PRN

## 2013-12-29 MED ORDER — CLOPIDOGREL BISULFATE 75 MG PO TABS
75.0000 mg | ORAL_TABLET | Freq: Every day | ORAL | Status: DC
  Administered 2013-12-30 – 2014-01-01 (×3): 75 mg via ORAL
  Filled 2013-12-29 (×4): qty 1

## 2013-12-29 MED ORDER — LISINOPRIL 5 MG PO TABS
5.0000 mg | ORAL_TABLET | Freq: Every day | ORAL | Status: DC
  Administered 2013-12-30 – 2014-01-01 (×3): 5 mg via ORAL
  Filled 2013-12-29 (×4): qty 1

## 2013-12-29 MED ORDER — FAMOTIDINE 20 MG PO TABS
20.0000 mg | ORAL_TABLET | Freq: Two times a day (BID) | ORAL | Status: DC
  Administered 2013-12-29 – 2014-01-01 (×6): 20 mg via ORAL
  Filled 2013-12-29 (×8): qty 1

## 2013-12-29 MED ORDER — ACETAMINOPHEN 325 MG PO TABS
325.0000 mg | ORAL_TABLET | ORAL | Status: DC | PRN
  Administered 2013-12-30 – 2013-12-31 (×3): 650 mg via ORAL
  Filled 2013-12-29 (×3): qty 2

## 2013-12-29 MED ORDER — ASPIRIN EC 325 MG PO TBEC
325.0000 mg | DELAYED_RELEASE_TABLET | Freq: Every day | ORAL | Status: DC
  Administered 2013-12-30 – 2014-01-01 (×3): 325 mg via ORAL
  Filled 2013-12-29 (×4): qty 1

## 2013-12-29 MED ORDER — SORBITOL 70 % SOLN: 30.0000 mL | Freq: Every day | Status: DC | PRN

## 2013-12-29 NOTE — Discharge Summary (Signed)
Stroke Discharge Summary  Patient ID: Justin Wells    l   MRN: [id number]      DOB: [birth year]  Date of Admission: 12/27/2013 Date of Discharge: 12/29/2013  Attending Physician:  Justin CheshirePramodkumar Makoto Sellitto, MD, Stroke MD  Consulting Physician(s):  Treatment Team:  Md Stroke, MD , Justin LawsAndrew Kirsteins, MD (Physical Medicine & Rehabtilitation)  Patient's PCP:  [name],Justin W, MD  Discharge Diagnoses:  Principal Problem:   Brainstem infarct not seen on MRI, s/p IV tPA Active Problems:   Hypertension   Other and unspecified hyperlipidemia BMI  Body mass index is 28.34 kg/(m^2).   Past Medical History  Diagnosis Date  . Peripheral arterial disease   . Hyperlipidemia   . Coronary artery disease   . Hypertension   . Barrett's esophagus   . Gastroesophageal reflux   . Cerebrovascular disease   . Myocardial infarction 2009    "during cardiac cath" (01/27/2013)  . H/O hiatal hernia   . WUJWJXBJ(478.2Headache(784.0)     "probably monthly" (01/27/2013)  . Seizures     "when I had my heart attack in 2009" (01/27/2013)  . Stroke 2009  2010    "left me Wells/partial paralysis on right face, weak right leg and hand; I've had 2 or 3 strokes; can't remember the dates" (01/27/2013)  . Arthritis     "hands" (01/27/2013)   Past Surgical History  Procedure Laterality Date  . Laparoscopic gastric banding  2010  . Cardiac catheterization    . Coronary angioplasty with stent placement  11/02/2007    "2" (01/27/2013)  . Laparoscopic nissen fundoplication  ?2001  . Femoral artery stent Right 01/26/2013  . Diagnostic laparoscopy      "twice after nissen; it kept coming apart " (01/27/2013)    Medications to be continued on Rehab . aspirin  325 mg Oral Daily  . clopidogrel  75 mg Oral Daily  . famotidine  20 mg Oral BID  . lisinopril  5 mg Oral Daily  . omega-3 acid ethyl esters  1 g Oral BID  . PARoxetine  20 mg Oral Daily    LABORATORY STUDIES CBC    Component Value Date/Time   WBC 8.2 12/27/2013 1235   RBC 4.85  12/27/2013 1235   HGB 14.6 12/27/2013 1242   HCT 43.0 12/27/2013 1242   PLT 178 12/27/2013 1235   MCV 86.8 12/27/2013 1235   MCH 30.3 12/27/2013 1235   MCHC 34.9 12/27/2013 1235   RDW 14.7 12/27/2013 1235   LYMPHSABS 1.3 12/27/2013 1235   MONOABS 0.9 12/27/2013 1235   EOSABS 0.6 12/27/2013 1235   BASOSABS 0.1 12/27/2013 1235   CMP    Component Value Date/Time   NA 138 12/27/2013 1242   K 4.0 12/27/2013 1242   CL 101 12/27/2013 1242   CO2 24 12/27/2013 1235   GLUCOSE 77 12/27/2013 1242   BUN 11 12/27/2013 1242   CREATININE 1.00 12/27/2013 1242   CALCIUM 8.7 12/27/2013 1235   PROT 6.8 12/27/2013 1235   ALBUMIN 3.7 12/27/2013 1235   AST 24 12/27/2013 1235   ALT 19 12/27/2013 1235   ALKPHOS 88 12/27/2013 1235   BILITOT 0.4 12/27/2013 1235   GFRNONAA 90* 12/27/2013 1235   GFRAA >90 12/27/2013 1235   COAGS Lab Results  Component Value Date   INR 0.97 12/27/2013   INR 1.0 11/03/2007   INR 1.0 10/13/2007   Lipid Panel    Component Value Date/Time   CHOL 151 12/28/2013 0238   TRIG 131 12/28/2013 0238  HDL 40 12/28/2013 0238   CHOLHDL 3.8 12/28/2013 0238   VLDL 26 12/28/2013 0238   LDLCALC 85 12/28/2013 0238   HgbA1C  Lab Results  Component Value Date   HGBA1C 5.4 12/28/2013   Cardiac Panel (last 3 results) No results found for this basename: CKTOTAL, CKMB, TROPONINI, RELINDX,  in the last 72 hours Urinalysis    Component Value Date/Time   COLORURINE YELLOW 12/27/2013 1447   APPEARANCEUR CLEAR 12/27/2013 1447   LABSPEC 1.024 12/27/2013 1447   PHURINE 6.0 12/27/2013 1447   GLUCOSEU NEGATIVE 12/27/2013 1447   HGBUR NEGATIVE 12/27/2013 1447   BILIRUBINUR NEGATIVE 12/27/2013 1447   KETONESUR NEGATIVE 12/27/2013 1447   PROTEINUR NEGATIVE 12/27/2013 1447   UROBILINOGEN 1.0 12/27/2013 1447   NITRITE NEGATIVE 12/27/2013 1447   LEUKOCYTESUR NEGATIVE 12/27/2013 1447   Urine Drug Screen     Component Value Date/Time   LABOPIA POSITIVE* 12/27/2013 1447   COCAINSCRNUR NONE DETECTED 12/27/2013 1447   LABBENZ NONE  DETECTED 12/27/2013 1447   AMPHETMU NONE DETECTED 12/27/2013 1447   THCU NONE DETECTED 12/27/2013 1447   LABBARB NONE DETECTED 12/27/2013 1447    Alcohol Level    Component Value Date/Time   ETH <11 12/27/2013 1235    SIGNIFICANT DIAGNOSTIC STUDIES CT of the brain 12/27/2013 Question recent infarct in the lower right mid brain and upper right pons. Elsewhere gray-white compartments appear normal. No acute hemorrhage or mass. Multifocal paranasal sinus disease, most pronounced in the ethmoid complexes bilaterally.  MRI of the brain 12/27/2013 No acute infarct. Very mild nonspecific white matter type changes probably related to result of small vessel disease. Mild to moderate paranasal sinus mucosal thickening with opacification of majority of ethmoid sinus air cells.  MRA of the brain 12/27/2013 Mild branch vessel irregularity  CT angio head 12/28/2013 Opacification/mucosal thickening paranasal sinuses most notable left maxillary sinus with there is an air-fluid level. Acute sinusitis not excluded. Almost complete opacification of majority of the ethmoid sinus air cells without evidence of intraorbital extension of sinus disease. Calcified cavernous segment internal carotid artery bilaterally with mild narrowing. Mild irregularity middle cerebral artery branch vessels. Fetal type origin of the left posterior communicating artery. 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 Posterior circulation mild branch vessel irregularity.  CT angio neck 12/28/2013 Aberrant origin of the right subclavian artery. Right vertebral artery and right common carotid artery with common origin. Left vertebral artery arises directly from the aortic arch with mild narrowing proximal aspect. Plaque carotid bifurcation bilaterally with less than 50% diameter stenosis bilaterally. Scattered normal to top-normal size lymph nodes throughout the neck of questionable significance/etiology. Circumferential thickening of  the cervical/ upper thoracic esophagus may be related to under distension although evaluation is limited.  2D Echocardiogram EF 60-65% with no source of embolus.  Carotid Doppler See CTA neck  CXR 12/27/2013 No active disease.  CT Angio Chest, Abd and Pelvis 12/27/2013 Scattered atherosclerotic disease changes without evidence of aneurysm or dissection. Small hiatal hernia. Single minimally enlarged nonspecific LEFT hilar lymph node. Cholelithiasis. 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 BILATERAL inguinal hernias containing fat. Distal colonic diverticulosis without evidence of diverticulitis.  EKG normal sinus rhythm. For complete results please see formal report.  Historical Tests done in [place]  MRA neck 07/19/2009 Unremarkable MRA of the neck.  MRA neck 11/24/2007 Unremarkable MRA of the carotids  MRI brain 07/19/2009 Normal brain MRI and Circle of Willis MRA.  MRI brain 03/06/2008 1. No evidence of acute infarction.  2. Proximal left M1 stenosis.  MRI brain 11/24/2007 Normal brain MR examination.     History of Present Illness   Justin Wells is an 63 y.o. male with a past medical history significant for HTN, hyperlipidemia, CAD s/p stent deployment x 3, MI, PAD s/p femoral stenting, TIA's, ischemic stroke in the setting of acute MI 2014, [facility] as a code stroke due to acute onset of right hemiparesis, right facial droop 12/27/2013 at 1100. He aid that he was home sitting in a couch when started feeling weak all over, developed severe dizziness " like room spinning" and then ambulance was called. According to patient and EMS, weakness of the right arm-leg and face as well as chest pain developed while in the truck. No double vision, difficulty swallowing, slurred speech, language or vision impairment. Chest pain radiated to his shoulders and back. NIHSS 4, BUT WITH SOME INCONSISTENCIES (1 for sensory, 1 for right arm weakness, 2 for right arm weakness). CT  brain showed a subtle decreased attenuation in the lower right mid brain and upper right pons, concerning for acute ischemia in this area. Patient underwent CTA chest that showed no evidence of dissection or PE. He takes plavix daily. Patient was administered TPA; it was delayed due to CP and concern for aortic dissection/PE. He was admitted to the neuro ICU for further evaluation and treatment.   Hospital Course Patient tolerated tPA without complication. Imaging at 24 hours shows no hemorrhage. Diagnosis:  left brain posterior circulation small infarct not visualized on MRI. Given history of sweating episodes Wells/ neuro changes that last 24h, concern for posterior circulation TIAs in past. Review of his old records reveal similar episode in 2009 with MRA of the neck showing no significant extracranial stenosis. On aspirin 325 mg orally every day and clopidogrel 75 mg orally every day prior to admission. He was continued on aspirin 325 mg orally every day and clopidogrel 75 mg orally every day for secondary stroke prevention.  Patient with vascular risk factors of:  hypertension  Hyperlipidemia, LDL 85, on fish oil PTA, now on no home meds, at goal LDL < 100  CAD - MI, stent 2009  Hx stroke 2009, 2010 - right hemiparesis face/arm/leg  PAD - right femoral artery stent 2014   Patient also with: Hx seizures 2009  Lap banding 2010  Physical therapy, occupational therapy and speech therapy evaluated patient. All agreed inpatient rehab is needed. Patient's wife and family is/are supportive and can provide care at discharge. CIR bed is available today and patient will be transferred there.  Discharge Exam  Blood pressure 127/79, pulse 65, temperature 97.6 F (36.4 C), temperature source Oral, resp. rate 16, height 5\' 10"  (1.778 m), weight 89.6 kg (197 lb 8.5 oz), SpO2 98.00%. Middle aged Caucasian male not in distress.Awake alert. Afebrile. Head is nontraumatic. Neck is supple without bruit. Hearing is  normal. Cardiac exam no murmur or gallop. Lungs are clear to auscultation. Distal pulses are well felt.  Neurological Exam : Awake alert oriented x3 with normal speech and language function. Extraocular movements are full range without nystagmus. Fundi were not visualized. Vision acuity seems adequate. Face is symmetric without weakness. Tongue is midline. Mild right upper and lower extremity drift but I doubt patient deferred as this appears variable. Mild weakness of the right grip and intrinsic hand muscles but again poor effort. Intact touch pin prick sensation. Finger-to-nose and knee to heel coordination is slow but accurate. Gait was  not tested.   Discharge Diet  General thin liquids  Discharge Plan  Disposition:  Transfer to [facility] for ongoing PT, OT and ST  aspirin 325 mg orally every day and clopidogrel 75 mg orally every day for secondary stroke prevention.  Recommend ongoing risk factor control by Primary Care Physician at time of discharge from inpatient rehabilitation.  Follow-up DAVIS,Justin W, MD in 2 weeks following discharge from rehab.  Follow-up with Dr. Delia Heady, Stroke Clinic in 2 months.  40 minutes were spent preparing discharge.  Signed Annie Main, MSN, RN, ANVP-BC, AGPCNP-BC Stroke Center Nurse Practitioner 12/29/2013, 1:42 PM  I have personally examined this patient, reviewed pertinent data and developed the plan of care. I agree with above. Delia Heady, MD

## 2013-12-29 NOTE — Progress Notes (Signed)
Pt. Transferred to 4MW#4.  Report given to CacheDaphne, Charity fundraiserN.  Vital signs and assessments were stable.

## 2013-12-29 NOTE — Progress Notes (Signed)
Stroke Team Progress Note  HISTORY Justin Wells is an 63 y.o. male with a past medical history significant for HTN, hyperlipidemia, CAD s/p stent deployment x 3, MI, PAD s/p femoral stenting, TIA's, ischemic stroke in the setting of acute MI 2014, brought [facility] ED by ambulance as a code stroke due to acute onset of right hemiparesis, right facial droop 12/27/2013 at 1100. He aid that he was home sitting in a couch when started feeling weak all over, developed severe dizziness " like room spinning" and then ambulance was called. According to patient and EMS, weakness of the right arm-leg and face as well as chest pain developed while in the truck. No double vision, difficulty swallowing, slurred speech, language or vision impairment. Chest pain radiated to his shoulders and back. NIHSS 4, BUT WITH SOME INCONSISTENCIES (1 for sensory, 1 for right arm weakness, 2 for right arm weakness). CT brain showed a subtle decreased attenuation in the lower right mid brain and upper right pons, concerning for acute ischemia in this area. Patient underwent CTA chest that showed no evidence of dissection or PE. He takes plavix daily.   Patient was administered TPA; it was delayed due to CP and concern for aortic dissection/PE. He was admitted to the neuro ICU for further evaluation and treatment.  SUBJECTIVE Family at the bedside. Plans for rehab today.  OBJECTIVE Most recent Vital Signs: Filed Vitals:   12/28/13 2051 12/29/13 0207 12/29/13 0511 12/29/13 1008  BP: 120/70 110/70 121/73 127/79  Pulse: 77 72 63 65  Temp: 99 F (37.2 C) 98.4 F (36.9 C) 98.5 F (36.9 C) 97.6 F (36.4 C)  TempSrc: Oral Oral Oral Oral  Resp: 16 15 15 16   Height:      Weight:      SpO2: 94% 95% 97% 98%   CBG (last 3)   Recent Labs  12/27/13 1320  GLUCAP 72    IV Fluid Intake:      MEDICATIONS  . aspirin  325 mg Oral Daily  . clopidogrel  75 mg Oral Daily  . famotidine  20 mg Oral BID  . lisinopril  5 mg Oral  Daily  . omega-3 acid ethyl esters  1 g Oral BID  . PARoxetine  20 mg Oral Daily   PRN:  acetaminophen, acetaminophen, labetalol, oxyCODONE-acetaminophen, senna-docusate  Diet:  General thin liquids Activity:  OOB with assistance DVT Prophylaxis:  SCDs   CLINICALLY SIGNIFICANT STUDIES Basic Metabolic Panel:   Recent Labs Lab 12/27/13 1235 12/27/13 1242  NA 136* 138  K 4.1 4.0  CL 99 101  CO2 24  --   GLUCOSE 76 77  BUN 12 11  CREATININE 0.92 1.00  CALCIUM 8.7  --    Liver Function Tests:   Recent Labs Lab 12/27/13 1235  AST 24  ALT 19  ALKPHOS 88  BILITOT 0.4  PROT 6.8  ALBUMIN 3.7   CBC:   Recent Labs Lab 12/27/13 1235 12/27/13 1242  WBC 8.2  --   NEUTROABS 5.4  --   HGB 14.7 14.6  HCT 42.1 43.0  MCV 86.8  --   PLT 178  --    Coagulation:   Recent Labs Lab 12/27/13 1235  LABPROT 12.7  INR 0.97   Cardiac Enzymes: No results found for this basename: CKTOTAL, CKMB, CKMBINDEX, TROPONINI,  in the last 168 hours Urinalysis:   Recent Labs Lab 12/27/13 1447  COLORURINE YELLOW  LABSPEC 1.024  PHURINE 6.0  GLUCOSEU NEGATIVE  HGBUR  NEGATIVE  BILIRUBINUR NEGATIVE  KETONESUR NEGATIVE  PROTEINUR NEGATIVE  UROBILINOGEN 1.0  NITRITE NEGATIVE  LEUKOCYTESUR NEGATIVE   Lipid Panel    Component Value Date/Time   CHOL 151 12/28/2013 0238   TRIG 131 12/28/2013 0238   HDL 40 12/28/2013 0238   CHOLHDL 3.8 12/28/2013 0238   VLDL 26 12/28/2013 0238   LDLCALC 85 12/28/2013 0238   HgbA1C  Lab Results  Component Value Date   HGBA1C 5.4 12/28/2013    Urine Drug Screen:     Component Value Date/Time   LABOPIA POSITIVE* 12/27/2013 1447   COCAINSCRNUR NONE DETECTED 12/27/2013 1447   LABBENZ NONE DETECTED 12/27/2013 1447   AMPHETMU NONE DETECTED 12/27/2013 1447   THCU NONE DETECTED 12/27/2013 1447   LABBARB NONE DETECTED 12/27/2013 1447    Alcohol Level:   Recent Labs Lab 12/27/13 1235  ETH <11    CT of the brain  12/27/2013    Question recent infarct in  the lower right mid brain and upper right pons. Elsewhere gray-white compartments appear normal. No acute hemorrhage or mass. Multifocal paranasal sinus disease, most pronounced in the ethmoid complexes bilaterally.    MRI of the brain  12/27/2013      No acute infarct.  Very mild nonspecific white matter type changes probably related to result of small vessel disease.  Mild to moderate paranasal sinus mucosal thickening with opacification of majority of ethmoid sinus air cells.    MRA of the brain  12/27/2013    Mild branch vessel irregularity   CT angio head 12/28/2013    Opacification/mucosal thickening paranasal sinuses most notable left maxillary sinus with there is an air-fluid level. Acute sinusitis not excluded. Almost complete opacification of majority of the ethmoid sinus air cells without evidence of intraorbital extension of sinus disease.  Calcified cavernous segment internal carotid artery bilaterally with mild narrowing.  Mild irregularity middle cerebral artery branch vessels.  Fetal type origin of the left posterior communicating artery.  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  Posterior circulation mild branch vessel irregularity.    CT angio neck  12/28/2013      Aberrant origin of the right subclavian artery.  Right vertebral artery and right common carotid artery with common origin.  Left vertebral artery arises directly from the aortic arch with mild narrowing proximal aspect.  Plaque carotid bifurcation bilaterally with less than 50% diameter stenosis bilaterally.  Scattered normal to top-normal size lymph nodes throughout the neck of questionable significance/etiology.  Circumferential thickening of the cervical/ upper thoracic esophagus may be related to under distension although evaluation is limited.     2D Echocardiogram  EF 60-65% with no source of embolus.   Carotid Doppler  See CTA neck  CXR  12/27/2013    No active disease.  CT Angio Chest, Abd and  Pelvis 12/27/2013    Scattered atherosclerotic disease changes without evidence of aneurysm or dissection.  Small hiatal hernia.  Single minimally enlarged nonspecific LEFT hilar lymph node.  Cholelithiasis.  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  BILATERAL inguinal hernias containing fat.  Distal colonic diverticulosis without evidence of diverticulitis.    EKG  normal sinus rhythm. For complete results please see formal report.   Historical Tests done in [place] MRA neck 07/19/2009 Unremarkable MRA of the neck. MRA neck 11/24/2007 Unremarkable MRA of the carotids MRI brain 07/19/2009 Normal brain MRI and Circle of Willis MRA. MRI brain 03/06/2008 1. No evidence of acute infarction.  2. Proximal  left M1 stenosis. MRI brain 11/24/2007 Normal brain MR examination.  Therapy Recommendations CIR  Physical Exam   Middle aged Caucasian male not in distress.Awake alert. Afebrile. Head is nontraumatic. Neck is supple without bruit. Hearing is normal. Cardiac exam no murmur or gallop. Lungs are clear to auscultation. Distal pulses are well felt. Neurological Exam : Awake alert oriented x3 with normal speech and language function. Extraocular movements are full range without nystagmus. Fundi were not visualized. Vision acuity seems adequate. Face is symmetric without weakness. Tongue is midline. Mild right upper and lower extremity drift but I doubt patient deferred as this appears variable. Mild weakness of the right grip and intrinsic hand muscles but again poor effort. Intact touch pin prick sensation. Finger-to-nose and knee to heel coordination is slow but accurate. Gait was not tested.  ASSESSMENT Mr. Justin ChimesGeorge F Daughtry is a 63 y.o. male presenting with acute onset right hemiparesis. Status post IV t-PA 12/27/2013 at 1326. Imaging confirms no acute infarct. Dx: left brain posterior circulation small infarct not visualized on MRI versus TIA. Given history of sweating episodes w/ neuro changes  that last 24h, concern for posterior circulation TIAs in past. Review of his old records reveal similar episode in 2009 with MRA of the neck showing no significant extracranial stenosis.  On aspirin 325 mg orally every day and clopidogrel 75 mg orally every day prior to admission. Now on no antithrombotics as within 24h of tPA for secondary stroke prevention. Stroke work up completed   hypertension  Hyperlipidemia, LDL 85, on fish oil PTA, now on no home meds, at goal LDL < 100   CAD - MI, stent 2009  Hx stroke 2009, 2010 - right hemiparesis face/arm/leg  PAD - right femoral artery stent 2014  Hx seizures 2009  Lap banding 2010  Hospital day # 2  TREATMENT/PLAN  Transfer to inpatient rehab today  Continue aspirin 325 mg orally every day and clopidogrel 75 mg orally every day for secondary stroke prevention.  No further stroke workup indicated.  Patient has a 10-15% risk of having another stroke over the next year, the highest risk is within 2 weeks of the most recent stroke/TIA (risk of having a stroke following a stroke or TIA is the same).  Ongoing risk factor control by Primary Care Physician  Follow up with Dr. Pearlean BrownieSethi, Stroke Clinic, in 2 months.  Annie MainSharon Biby, MSN, RN, ANVP-BC, AGPCNP-BC [facility] Pager: [phone] 12/29/2013 11:05 AM  I have personally obtained a history, examined the patient, evaluated imaging results, and formulated the assessment and plan of care. I agree with the above.  Delia HeadyPramod Loretta Kluender, MD  To contact Stroke Continuity provider, please refer to [email or web address]. After hours, contact General Neurology

## 2013-12-29 NOTE — PMR Pre-admission (Signed)
PMR Admission Coordinator Pre-Admission Assessment  Patient: Justin Wells is an 63 y.o., male MRN: [id number] DOB: [birth year] Height: 5\' 10"  (177.8 cm) Weight: 89.6 kg (197 lb 8.5 oz)              Insurance Information HMO:      PPO:       PCP:       IPA:       80/20:       OTHER:   PRIMARY: Medicare Part A only      Policy#: [id number]      Subscriber: Nadara ModeGeorge Wisniewski CM Name:        Phone#:       Fax#:   Pre-Cert#:        Employer: Works as a Energy managerwelder Benefits:  Phone #:       Name: Checked in [employer] Date: 01/31/10 Part A only     Deduct: $1260      Out of Pocket Max: none      Life Max: unlimited CIR: 100%      SNF: 100 days Outpatient: No coverage     Co-Pay:   Home Health: 100%      Co-Pay: none DME: no coverage     Co-Pay:   Providers: patient's choice  SECONDARY: [place]      Policy#: [id number]      Subscriber: Nadara ModeGeorge Morrone CM Name:        Phone#:       Fax#:   Pre-Cert#:        Employer:   Benefits:  Phone #: [phone]     Name:   Eff. Date:       Deduct:        Out of Pocket Max:        Life Max:   CIR:        SNF:   Outpatient:       Co-Pay:   Home Health:        Co-Pay:   DME:       Co-Pay:     Emergency Contact Information Contact Information   Name Relation Home Work Mobile   Bommarito,[name] Spouse [phone]  [phone]   Lattie Cornslston,Jamie Daughter [phone]     Dewitt HoesWarren,Angela Daughter [phone]       Current Medical History  Patient Admitting Diagnosis:  L Ever[place]  Infarct  History of Present Illness: A 63 y.o. right-handed male with history of hypertension, CAD status post stent x3, TIA, ischemic infarct in the setting of acute MI 2014. Independent prior to admission and driving living with his wife. Patient admitted 12/27/2013 with right-sided weakness as well as dizziness and slurred speech. No issues with weakness PTA per pt today, working full time as Psychologist, occupationalwelder.  MRI of the brain with no acute findings. Cardiac enzymes negative. MRA of the  head without stenosis or occlusion. Echocardiogram with ejection fraction of 65% grade 1 diastolic dysfunction. Patient did receive TPA. CTA of the head with posterior circulation mild branch vessel irregularity. CTA of the neck with plaque carotid bifurcation bilaterally with less than 50% diameter stenosis. Neurology services followup with suspect left brain posterior circulation small infarct not visualized on MRI. Maintained on aspirin and Plavix for CVA prophylaxis. Patient is tolerating a regular diet. Physical and occupational therapy evaluations completed with recommendations of physical medicine and rehabilitation consult.      Total: 2=NIH  Past Medical History  Past Medical History  Diagnosis Date  . Peripheral arterial  disease   . Hyperlipidemia   . Coronary artery disease   . Hypertension   . Barrett's esophagus   . Gastroesophageal reflux   . Cerebrovascular disease   . Myocardial infarction 2009    "during cardiac cath" (01/27/2013)  . H/O hiatal hernia   . VHQIONGE(952.8Headache(784.0)     "probably monthly" (01/27/2013)  . Seizures     "when I had my heart attack in 2009" (01/27/2013)  . 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 Arthritis     "hands" (01/27/2013)    Family History  family history includes Heart attack in his father and mother; Heart disease in his mother; Hypertension in his mother.  Prior Rehab/Hospitalizations:  Had HH therapies after CVA 5 yrs ago   Current Medications  Current facility-administered medications:acetaminophen (TYLENOL) suppository 650 mg, 650 mg, Rectal, Q4H PRN, Layne BentonSharon L Biby, NP, 650 mg at 12/27/13 2015;  acetaminophen (TYLENOL) tablet 650 mg, 650 mg, Oral, Q4H PRN, Layne BentonSharon L Biby, NP, 650 mg at 12/28/13 1335;  aspirin EC tablet 325 mg, 325 mg, Oral, Daily, Layne BentonSharon L Biby, NP, 325 mg at 12/29/13 1000;  clopidogrel (PLAVIX) tablet 75 mg,  75 mg, Oral, Daily, Layne BentonSharon L Biby, NP, 75 mg at 12/29/13 1000 famotidine (PEPCID) tablet 20 mg, 20 mg, Oral, BID, Layne BentonSharon L Biby, NP, 20 mg at 12/29/13 0959;  labetalol (NORMODYNE,TRANDATE) injection 10 mg, 10 mg, Intravenous, Q10 min PRN, Wyatt Portelasvaldo Camilo, MD;  lisinopril (PRINIVIL,ZESTRIL) tablet 5 mg, 5 mg, Oral, Daily, Layne BentonSharon L Biby, NP, 5 mg at 12/29/13 [id number];  omega-3 acid ethyl esters (LOVAZA) capsule 1 g, 1 g, Oral, BID, Micki RileyPramod S [name], MD, 1 g at 12/29/13 1000 oxyCODONE-acetaminophen (PERCOCET/ROXICET) 5-325 MG per tablet 1 tablet, 1 tablet, Oral, Q6H PRN, Layne BentonSharon L Biby, NP, 1 tablet at 12/29/13 0548;  PARoxetine (PAXIL) tablet 20 mg, 20 mg, Oral, Daily, Layne BentonSharon L Biby, NP, 20 mg at 12/29/13 1000;  senna-docusate (Senokot-S) tablet 1 tablet, 1 tablet, Oral, QHS PRN, Wyatt Portelasvaldo Camilo, MD  Patients Current Diet: General  Precautions / Restrictions Precautions Precautions: Fall Restrictions Weight Bearing Restrictions: No   Prior Activity Level Community (5-7x/wk): Went out daily.  Home Assistive Devices / Equipment Home Assistive Devices/Equipment: None Home Equipment: None  Prior Functional Level Prior Function Level of Independence: Independent  Current Functional Level Cognition  Overall Cognitive Status: Within Functional Limits for tasks assessed Orientation Level: Oriented X4    Extremity Assessment (includes Sensation/Coordination)          ADLs  Overall ADL's : Needs assistance/impaired Eating/Feeding: Independent;Sitting Grooming: Set up;Sitting Upper Body Bathing: Set up;Sitting Lower Body Bathing: Minimal assistance;Sit to/from stand Upper Body Dressing : Set up;Sitting Lower Body Dressing: Minimal assistance;Sit to/from stand Toilet Transfer: Minimal assistance;Ambulation Toileting- Clothing Manipulation and Hygiene: Minimal assistance Functional mobility during ADLs: Minimal assistance    Mobility  Overal bed mobility: Modified Independent General bed  mobility comments: increased time, HOB elevated, definite use of hands    Transfers  Overall transfer level: Needs assistance Equipment used: 1 person hand held assist Transfers: Sit to/from Stand Sit to Stand: Min assist General transfer comment: pt unsteady, guarded due to report "I feel like my R leg is going to give out"    Ambulation / Gait / Stairs / Wheelchair Mobility  Ambulation/Gait Ambulation/Gait assistance: Min assist Ambulation Distance (Feet): 60 Feet Assistive device: Rolling  walker (2 wheeled);1 person hand held assist Gait Pattern/deviations: Step-to pattern;Decreased stride length;Antalgic Gait velocity: slow and guarded General Gait Details: pt with report "I feel like my R leg is going to give out one me." Pt very cautious with 1 person HHA. pt with improved step length with RW however dependent on UEs. Attempted to have patient relax bilat UEs however pt with R sided weakness.    Posture / Balance      Special needs/care consideration BiPAP/CPAP No CPM No Continuous Drip IV No  Dialysis No         Life Vest No Oxygen No Special Bed No Trach Size No Wound Vac (area) No      Skin Bruises easily                              Bowel mgmt: Last BM 12/29/13 Bladder mgmt: Voiding in bathroom with assistance Diabetic mgmt No    Previous Home Environment Living Arrangements: Spouse/significant other;Children Available Help at Discharge: Family;Available 24 hours/day Type of Home: House Home Layout: One level Home Access: Stairs to enter Entrance Stairs-Rails: Right Entrance Stairs-Number of Steps: 3 Bathroom Shower/Tub: Engineer, manufacturing systems: Standard Home Care Services: No 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 Does the patient have any problems obtaining your medications?: No  Social/Family/Support Systems Patient Roles: Spouse;Parent (Has a wife, 2 sons and 1 Dtr.) Contact Information: Peder Allums - wife Anticipated Caregiver: Wife Anticipated Caregiver's Contact Information: Tora Duck - wife (h) [phone] (c) [phone] Ability/Limitations of Caregiver: Wife can assist Caregiver Availability: 24/7 Discharge Plan Discussed with Primary Caregiver: Yes Is Caregiver In Agreement with Plan?: Yes Does Caregiver/Family have Issues with Lodging/Transportation while Pt is in Rehab?: No  Goals/Additional Needs Patient/Family Goal for Rehab: PT/OT mod I goals, no ST needs Expected length of stay: 7 days Cultural Considerations: Pentacostal Dietary Needs: Regular, thin liquids Equipment Needs: TBD Pt/Family Agrees to Admission and willing to participate: Yes Program Orientation Provided & Reviewed with Pt/Caregiver Including Roles  & Responsibilities: Yes   Decrease burden of Care through IP rehab admission: N/A  Possible need for SNF placement upon discharge: Not anticipated   Patient Condition: This patient's condition remains as documented in the consult dated 12/29/13, in which the Rehabilitation Physician determined and documented that the patient's condition is appropriate for intensive rehabilitative care in an inpatient rehabilitation facility. Will admit to inpatient rehab today.  Preadmission Screen Completed By:  Trish Mage, 12/29/2013 11:35 AM ______________________________________________________________________   Discussed status with Dr. Riley Kill on 12/29/13 at 1143 and received telephone approval for admission today.  Admission Coordinator:  Trish Mage, time1143/Date04/29/15

## 2013-12-29 NOTE — H&P (View-Only) (Signed)
Physical Medicine and Rehabilitation Admission H&P    Chief Complaint  Patient presents with  . Code Stroke  : HPI: Justin Wells is a 63 y.o. right-handed male with history of hypertension, CAD status post stent x3, TIA, ischemic infarct in the setting of acute MI 2014. Independent prior to admission working full time  living with his wife. Patient admitted 12/27/2013 with right-sided weakness as well as dizziness and slurred speech. MRI of the brain with no acute findings. Cardiac enzymes negative. MRA of the head without stenosis or occlusion. Echocardiogram with ejection fraction of 16% grade 1 diastolic dysfunction. Patient did receive TPA. CTA of the head with posterior circulation mild branch vessel irregularity. CTA of the neck with plaque carotid bifurcation bilaterally with less than 50% diameter stenosis. Neurology services followup with suspect left brain posterior circulation small infarct not visualized on MRI. Maintained on aspirin and Plavix for CVA prophylaxis. Patient is tolerating a regular diet. Physical and occupational therapy evaluations completed with recommendations of physical medicine and rehabilitation consult. Admitted for comprehensive rehabilitation program   ROS Review of Systems  Musculoskeletal: Positive for myalgias.  Neurological: Positive for dizziness, speech change, weakness and headaches. Depression All other systems reviewed and are negative  Past Medical History  Diagnosis Date  . Peripheral arterial disease   . Hyperlipidemia   . Coronary artery disease   . Hypertension   . Barrett's esophagus   . Gastroesophageal reflux   . Cerebrovascular disease   . Myocardial infarction 2009    "during cardiac cath" (01/27/2013)  . H/O hiatal hernia   . XWRUEAVW(098.1)     "probably monthly" (01/27/2013)  . Seizures     "when I had my heart attack in 2009" (01/27/2013)  . Stroke 2009  2010    "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, weak right leg  and hand; I've had 2 or 3 strokes; can't remember the dates" (01/27/2013)  . Arthritis     "hands" (01/27/2013)   Past Surgical History  Procedure Laterality Date  . Laparoscopic gastric banding  2010  . Cardiac catheterization    . Coronary angioplasty with stent placement  11/02/2007    "2" (01/27/2013)  . Laparoscopic nissen fundoplication  ?2001  . Femoral artery stent Right 01/26/2013  . Diagnostic laparoscopy      "twice after nissen; it kept coming apart " (01/27/2013)   Family History  Problem Relation Age of Onset  . Heart disease Mother     Heart Disease before age 4  . Hypertension Mother   . Heart attack Mother   . Heart attack Father    Social History:  reports that he quit smoking about 12 years ago. His smoking use included Cigarettes. He has a 70 pack-year smoking history. He has never used smokeless tobacco. He reports that he does not drink alcohol or use illicit drugs. Allergies: No Known Allergies Medications Prior to Admission  Medication Sig Dispense Refill  . aspirin 325 MG EC tablet Take 325 mg by mouth daily.      . clopidogrel (PLAVIX) 75 MG tablet Take 1 tablet (75 mg total) by mouth daily.  90 tablet  3  . fish oil-omega-3 fatty acids 1000 MG capsule Take 1 g by mouth 2 (two) times daily.       Marland Kitchen lisinopril (PRINIVIL,ZESTRIL) 5 MG tablet Take 5 mg by mouth daily.      . Multiple Vitamins-Minerals (ONE-A-DAY MENS VITACRAVES PO) Take by mouth daily.      Marland Kitchen  oxyCODONE-acetaminophen (PERCOCET/ROXICET) 5-325 MG per tablet Take 1 tablet by mouth every 6 (six) hours as needed for pain.       Marland Kitchen PARoxetine (PAXIL) 20 MG tablet Take 20 mg by mouth every morning.      . ranitidine (ZANTAC) 150 MG tablet Take 150 mg by mouth 2 (two) times daily.      . vitamin B-12 (CYANOCOBALAMIN) 500 MCG tablet Take 500 mcg by mouth daily.      . nitroGLYCERIN (NITROSTAT) 0.4 MG SL tablet Place 0.4 mg under the tongue every 5 (five) minutes as needed for chest pain.         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 History: Prior Function Level of Independence: Independent  Functional Status:  Mobility: Bed Mobility Overal bed mobility: Modified Independent General bed mobility comments: increased time, HOB elevated, definite use of hands Transfers Overall transfer level: Needs assistance Equipment used: 1 person hand held assist Transfers: Sit to/from Stand Sit to Stand: Min assist General transfer comment: pt unsteady, guarded due to report "I feel like my R leg is going to give out" Ambulation/Gait Ambulation/Gait assistance: Min assist Ambulation Distance (Feet): 60 Feet Assistive device: Rolling walker (2 wheeled);1 person hand held assist Gait Pattern/deviations: Step-to pattern;Decreased stride length;Antalgic Gait velocity: slow and guarded General Gait Details: pt with report "I feel like my R leg is going to give out one me." Pt very cautious with 1 person HHA. pt with improved step length with RW however dependent on UEs. Attempted to have patient relax bilat UEs however pt with R sided weakness.    ADL: min assist with self-care/adl's    Cognition: Cognition Overall Cognitive Status: Within Functional Limits for tasks assessed Orientation Level: Oriented X4 Cognition Arousal/Alertness: Awake/alert Behavior During Therapy: WFL for tasks assessed/performed Overall Cognitive Status: Within Functional Limits for tasks assessed  Physical Exam: Blood pressure 127/79, pulse 65, temperature 97.6 F (36.4 C), temperature source Oral, resp. rate 16, height _0  (1.778  m), weight 89.6 kg (197 lb 8.5 oz), SpO2 98.00%. Physical Exam Constitutional: He is oriented to person, place, and time.  HENT: oral mucosa pink and moist Head: Normocephalic.  Eyes: EOM are normal.PERRL Neck: Normal range of motion. Neck supple. No thyromegaly present.  Cardiovascular: Normal rate and regular rhythm. No murmur or rub Respiratory: Effort normal and breath sounds normal. No respiratory distress. No wheezes or rales GI: Soft. Bowel sounds are normal. He exhibits no distension. No pain Neurological: He is alert and oriented to person, place, and time.  Follows full commands. Makes good eye contact with examiner. Mild right facial droop. No tongue deviation. No other CN abn. Speech clear. Good insight and awareness. Memory intact  3+ to 4 minus/5 right deltoid, bicep, tricep, grip 5/5 left deltoid, bicep, tricep, grip  3-/5 right hip flexor, 3+ to 4- knee extensor ankle dorsiflexor, APF 5/5 left hip flexor knee extensor ankle dorsiflex  1+/2  light touch in right upper and lower extremities. Normal sens left side.  Psych: pleasant and cooperative  Results for orders placed during the hospital encounter of 12/27/13 (from the past 48 hour(s))  ETHANOL     Status: None   Collection Time    12/27/13  12:35 PM      Result Value Ref Range   Alcohol, Ethyl (B) <11  0 - 11 mg/dL   Comment:            LOWEST DETECTABLE LIMIT FOR     SERUM ALCOHOL IS 11 mg/dL     FOR MEDICAL PURPOSES ONLY  PROTIME-INR     Status: None   Collection Time    12/27/13 12:35 PM      Result Value Ref Range   Prothrombin Time 12.7  11.6 - 15.2 seconds   INR 0.97  0.00 - 1.49  APTT     Status: None   Collection Time    12/27/13 12:35 PM      Result Value Ref Range   aPTT 24  24 - 37 seconds  CBC     Status: None   Collection Time    12/27/13 12:35 PM      Result Value Ref Range   WBC 8.2  4.0 - 10.5 K/uL   RBC 4.85  4.22 - 5.81 MIL/uL   Hemoglobin 14.7  13.0 - 17.0 g/dL   HCT 42.1  39.0 -  52.0 %   MCV 86.8  78.0 - 100.0 fL   MCH 30.3  26.0 - 34.0 pg   MCHC 34.9  30.0 - 36.0 g/dL   RDW 14.7  11.5 - 15.5 %   Platelets 178  150 - 400 K/uL  DIFFERENTIAL     Status: Abnormal   Collection Time    12/27/13 12:35 PM      Result Value Ref Range   Neutrophils Relative % 65  43 - 77 %   Neutro Abs 5.4  1.7 - 7.7 K/uL   Lymphocytes Relative 16  12 - 46 %   Lymphs Abs 1.3  0.7 - 4.0 K/uL   Monocytes Relative 11  3 - 12 %   Monocytes Absolute 0.9  0.1 - 1.0 K/uL   Eosinophils Relative 7 (*) 0 - 5 %   Eosinophils Absolute 0.6  0.0 - 0.7 K/uL   Basophils Relative 1  0 - 1 %   Basophils Absolute 0.1  0.0 - 0.1 K/uL  COMPREHENSIVE METABOLIC PANEL     Status: Abnormal   Collection Time    12/27/13 12:35 PM      Result Value Ref Range   Sodium 136 (*) 137 - 147 mEq/L   Potassium 4.1  3.7 - 5.3 mEq/L   Chloride 99  96 - 112 mEq/L   CO2 24  19 - 32 mEq/L   Glucose, Bld 76  70 - 99 mg/dL   BUN 12  6 - 23 mg/dL   Creatinine, Ser 0.92  0.50 - 1.35 mg/dL   Calcium 8.7  8.4 - 10.5 mg/dL   Total Protein 6.8  6.0 - 8.3 g/dL   Albumin 3.7  3.5 - 5.2 g/dL   AST 24  0 - 37 U/L   ALT 19  0 - 53 U/L   Alkaline Phosphatase 88  39 - 117 U/L   Total Bilirubin 0.4  0.3 - 1.2 mg/dL   GFR calc non Af Amer 90 (*) >90 mL/min   GFR calc Af Amer >90  >90 mL/min   Comment: (NOTE)     The eGFR has been calculated using the CKD EPI equation.     This calculation has not been validated in all clinical situations.     eGFR's persistently <90 mL/min signify possible Chronic Kidney  Disease.  Randolm Idol, ED     Status: None   Collection Time    12/27/13 12:41 PM      Result Value Ref Range   Troponin i, poc 0.00  0.00 - 0.08 ng/mL   Comment 3            Comment: Due to the release kinetics of cTnI,     a negative result within the first hours     of the onset of symptoms does not rule out     myocardial infarction with certainty.     If myocardial infarction is still suspected,      repeat the test at appropriate intervals.  I-STAT CHEM 8, ED     Status: Abnormal   Collection Time    12/27/13 12:42 PM      Result Value Ref Range   Sodium 138  137 - 147 mEq/L   Potassium 4.0  3.7 - 5.3 mEq/L   Chloride 101  96 - 112 mEq/L   BUN 11  6 - 23 mg/dL   Creatinine, Ser 1.00  0.50 - 1.35 mg/dL   Glucose, Bld 77  70 - 99 mg/dL   Calcium, Ion 1.10 (*) 1.13 - 1.30 mmol/L   TCO2 23  0 - 100 mmol/L   Hemoglobin 14.6  13.0 - 17.0 g/dL   HCT 43.0  39.0 - 52.0 %  CBG MONITORING, ED     Status: None   Collection Time    12/27/13  1:20 PM      Result Value Ref Range   Glucose-Capillary 72  70 - 99 mg/dL  URINE RAPID DRUG SCREEN (HOSP PERFORMED)     Status: Abnormal   Collection Time    12/27/13  2:47 PM      Result Value Ref Range   Opiates POSITIVE (*) NONE DETECTED   Cocaine NONE DETECTED  NONE DETECTED   Benzodiazepines NONE DETECTED  NONE DETECTED   Amphetamines NONE DETECTED  NONE DETECTED   Tetrahydrocannabinol NONE DETECTED  NONE DETECTED   Barbiturates NONE DETECTED  NONE DETECTED   Comment:            DRUG SCREEN FOR MEDICAL PURPOSES     ONLY.  IF CONFIRMATION IS NEEDED     FOR ANY PURPOSE, NOTIFY LAB     WITHIN 5 DAYS.                LOWEST DETECTABLE LIMITS     FOR URINE DRUG SCREEN     Drug Class       Cutoff (ng/mL)     Amphetamine      1000     Barbiturate      200     Benzodiazepine   929     Tricyclics       244     Opiates          300     Cocaine          300     THC              50  URINALYSIS, ROUTINE W REFLEX MICROSCOPIC     Status: None   Collection Time    12/27/13  2:47 PM      Result Value Ref Range   Color, Urine YELLOW  YELLOW   APPearance CLEAR  CLEAR   Specific Gravity, Urine 1.024  1.005 - 1.030   pH 6.0  5.0 - 8.0  Glucose, UA NEGATIVE  NEGATIVE mg/dL   Hgb urine dipstick NEGATIVE  NEGATIVE   Bilirubin Urine NEGATIVE  NEGATIVE   Ketones, ur NEGATIVE  NEGATIVE mg/dL   Protein, ur NEGATIVE  NEGATIVE mg/dL   Urobilinogen, UA  1.0  0.0 - 1.0 mg/dL   Nitrite NEGATIVE  NEGATIVE   Leukocytes, UA NEGATIVE  NEGATIVE   Comment: MICROSCOPIC NOT DONE ON URINES WITH NEGATIVE PROTEIN, BLOOD, LEUKOCYTES, NITRITE, OR GLUCOSE <1000 mg/dL.  MRSA PCR SCREENING     Status: None   Collection Time    12/27/13  6:32 PM      Result Value Ref Range   MRSA by PCR NEGATIVE  NEGATIVE   Comment:            The GeneXpert MRSA Assay (FDA     approved for NASAL specimens     only), is one component of a     comprehensive MRSA colonization     surveillance program. It is not     intended to diagnose MRSA     infection nor to guide or     monitor treatment for     MRSA infections.  HEMOGLOBIN A1C     Status: None   Collection Time    12/28/13  2:37 AM      Result Value Ref Range   Hemoglobin A1C 5.4  <5.7 %   Comment: (NOTE)                                                                               According to the ADA Clinical Practice Recommendations for 2011, when     HbA1c is used as a screening test:      >=6.5%   Diagnostic of Diabetes Mellitus               (if abnormal result is confirmed)     5.7-6.4%   Increased risk of developing Diabetes Mellitus     References:Diagnosis and Classification of Diabetes Mellitus,Diabetes     QBHA,1937,90(WIOXB 1):S62-S69 and Standards of Medical Care in             Diabetes - 2011,Diabetes Care,2011,34 (Suppl 1):S11-S61.   Mean Plasma Glucose 108  <117 mg/dL   Comment: Performed at [facility]     Status: None   Collection Time    12/28/13  2:38 AM      Result Value Ref Range   Cholesterol 151  0 - 200 mg/dL   Triglycerides 131  <150 mg/dL   HDL 40  >39 mg/dL   Total CHOL/HDL Ratio 3.8     VLDL 26  0 - 40 mg/dL   LDL Cholesterol 85  0 - 99 mg/dL   Comment:            Total Cholesterol/HDL:CHD Risk     Coronary Heart Disease Risk Table                         Men   Women      1/2 Average Risk   3.4   3.3      Average Risk  5.0   4.4      2 X  Average Risk   9.6   7.1      3 X Average Risk  23.4   11.0                Use the calculated Patient Ratio     above and the CHD Risk Table     to determine the patient's CHD Risk.                ATP III CLASSIFICATION (LDL):      <100     mg/dL   Optimal      100-129  mg/dL   Near or Above                        Optimal      130-159  mg/dL   Borderline      160-189  mg/dL   High      >190     mg/dL   Very High   Ct Angio Head W/cm &/or Wo Cm  12/28/2013   CLINICAL DATA:  Right-sided weakness has resolved. Persistent headaches. Hyperlipidemia. Hypertension. Seizures. Prior myocardial infarction.  EXAM: CT ANGIOGRAPHY HEAD AND NECK  TECHNIQUE: Multidetector CT imaging of the head and neck was performed using the standard protocol during bolus administration of intravenous contrast. Multiplanar CT image reconstructions and MIPs were obtained to evaluate the vascular anatomy. Carotid stenosis measurements (when applicable) are obtained utilizing NASCET criteria, using the distal internal carotid diameter as the denominator.  CONTRAST:  75m OMNIPAQUE IOHEXOL 350 MG/ML SOLN  COMPARISON:  12/27/2013 MR brain and MR angiogram circle [facility].  FINDINGS: CTA HEAD FINDINGS  No intracranial hemorrhage.  No CT evidence of large acute infarct.  No intracranial enhancing lesion.  Opacification/mucosal thickening paranasal sinuses most notable left maxillary sinus with there is an air-fluid level. Acute sinusitis not excluded. Almost complete opacification of majority of the ethmoid sinus air cells without evidence of intraorbital extension of sinus disease.  Calcified cavernous segment internal carotid artery bilaterally with mild narrowing.  Mild irregularity middle cerebral artery branch vessels.  Fetal type origin of the left posterior communicating artery.  Slightly ectatic vertebral arteries without high-grade stenosis.  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  Posterior circulation  mild branch vessel irregularity.  Review of the MIP images confirms the above findings.  CTA NECK FINDINGS  Aberrant origin of the right subclavian artery.  Right vertebral artery and right common carotid artery with common origin.  Left vertebral artery arises directly from the aortic arch with mild narrowing proximal aspect.  Plaque carotid bifurcation bilaterally with less than 50% diameter stenosis bilaterally.  No worrisome primary neck mass identified.  Scattered normal to top-normal size lymph nodes throughout the neck of questionable significance/etiology.  Mild cervical spondylotic changes with various degrees of spinal stenosis and foraminal narrowing.  Circumferential thickening of the cervical/ upper thoracic esophagus may be related to under distension although evaluation is limited.  Caries of residual teeth.  Review of the MIP images confirms the above findings.  IMPRESSION: CTA HEAD:  Opacification/mucosal thickening paranasal sinuses most notable left maxillary sinus with there is an air-fluid level. Acute sinusitis not excluded. Almost complete opacification of majority of the ethmoid sinus air cells without evidence of intraorbital extension of sinus disease.  Calcified cavernous segment internal carotid artery bilaterally with mild narrowing.  Mild irregularity middle cerebral artery branch vessels.  Fetal type origin  of the left posterior communicating artery.  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  Posterior circulation mild branch vessel irregularity.  CTA NECK :  Aberrant origin of the right subclavian artery.  Right vertebral artery and right common carotid artery with common origin.  Left vertebral artery arises directly from the aortic arch with mild narrowing proximal aspect.  Plaque carotid bifurcation bilaterally with less than 50% diameter stenosis bilaterally.  Scattered normal to top-normal size lymph nodes throughout the neck of questionable  significance/etiology.  Circumferential thickening of the cervical/ upper thoracic esophagus may be related to under distension although evaluation is limited.   Electronically Signed   By: Chauncey Cruel M.D.   On: 12/28/2013 13:09   Ct Head Wo Contrast  12/27/2013   CLINICAL DATA:  Right lower extremity weakness of acute onset  EXAM: CT HEAD WITHOUT CONTRAST  TECHNIQUE: Contiguous axial images were obtained from the base of the skull through the vertex without intravenous contrast. Study was obtained within 24 hr of patient's arrival at the emergency department.  COMPARISON:  September 08, 2007  FINDINGS: The ventricles are normal in size and configuration. There is no appreciable mass, hemorrhage, extra-axial fluid collection, or midline shift. There is subtle decreased attenuation in the lower right mid brain and upper right pons, concerning for recent infarct in this area. Elsewhere, gray-white compartments appear normal. There is mild stable increased attenuation in both middle cerebral arteries, a stable symmetric finding of doubtful significance.  Bony calvarium appears intact. The mastoid air cells are clear. There is mucosal thickening in both maxillary antra as well as extensive ethmoid sinus disease bilaterally. There is moderate mucosal thickening in the inferior frontal sinuses bilaterally as well as in the left and right sphenoid sinuses.  IMPRESSION: Question recent infarct in the lower right mid brain and upper right pons. Elsewhere gray-white compartments appear normal. No acute hemorrhage or mass. Multifocal paranasal sinus disease, most pronounced in the ethmoid complexes bilaterally.  Critical Value/emergent results were called by telephone at the time of interpretation on 12/27/2013 at 12:52 PM to Dr. Aram Beecham, neurology3, who verbally acknowledged these results.   Electronically Signed   By: Lowella Grip M.D.   On: 12/27/2013 12:56   Ct Angio Neck W/cm &/or Wo/cm  12/28/2013   CLINICAL  DATA:  Right-sided weakness has resolved. Persistent headaches. Hyperlipidemia. Hypertension. Seizures. Prior myocardial infarction.  EXAM: CT ANGIOGRAPHY HEAD AND NECK  TECHNIQUE: Multidetector CT imaging of the head and neck was performed using the standard protocol during bolus administration of intravenous contrast. Multiplanar CT image reconstructions and MIPs were obtained to evaluate the vascular anatomy. Carotid stenosis measurements (when applicable) are obtained utilizing NASCET criteria, using the distal internal carotid diameter as the denominator.  CONTRAST:  63m OMNIPAQUE IOHEXOL 350 MG/ML SOLN  COMPARISON:  12/27/2013 MR brain and MR angiogram circle [facility].  FINDINGS: CTA HEAD FINDINGS  No intracranial hemorrhage.  No CT evidence of large acute infarct.  No intracranial enhancing lesion.  Opacification/mucosal thickening paranasal sinuses most notable left maxillary sinus with there is an air-fluid level. Acute sinusitis not excluded. Almost complete opacification of majority of the ethmoid sinus air cells without evidence of intraorbital extension of sinus disease.  Calcified cavernous segment internal carotid artery bilaterally with mild narrowing.  Mild irregularity middle cerebral artery branch vessels.  Fetal type origin of the left posterior communicating artery.  Slightly ectatic vertebral arteries without high-grade stenosis.  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  Posterior circulation mild branch  vessel irregularity.  Review of the MIP images confirms the above findings.  CTA NECK FINDINGS  Aberrant origin of the right subclavian artery.  Right vertebral artery and right common carotid artery with common origin.  Left vertebral artery arises directly from the aortic arch with mild narrowing proximal aspect.  Plaque carotid bifurcation bilaterally with less than 50% diameter stenosis bilaterally.  No worrisome primary neck mass identified.  Scattered normal to  top-normal size lymph nodes throughout the neck of questionable significance/etiology.  Mild cervical spondylotic changes with various degrees of spinal stenosis and foraminal narrowing.  Circumferential thickening of the cervical/ upper thoracic esophagus may be related to under distension although evaluation is limited.  Caries of residual teeth.  Review of the MIP images confirms the above findings.  IMPRESSION: CTA HEAD:  Opacification/mucosal thickening paranasal sinuses most notable left maxillary sinus with there is an air-fluid level. Acute sinusitis not excluded. Almost complete opacification of majority of the ethmoid sinus air cells without evidence of intraorbital extension of sinus disease.  Calcified cavernous segment internal carotid artery bilaterally with mild narrowing.  Mild irregularity middle cerebral artery branch vessels.  Fetal type origin of the left posterior communicating artery.  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  Posterior circulation mild branch vessel irregularity.  CTA NECK :  Aberrant origin of the right subclavian artery.  Right vertebral artery and right common carotid artery with common origin.  Left vertebral artery arises directly from the aortic arch with mild narrowing proximal aspect.  Plaque carotid bifurcation bilaterally with less than 50% diameter stenosis bilaterally.  Scattered normal to top-normal size lymph nodes throughout the neck of questionable significance/etiology.  Circumferential thickening of the cervical/ upper thoracic esophagus may be related to under distension although evaluation is limited.   Electronically Signed   By: Chauncey Cruel M.D.   On: 12/28/2013 13:09   Ct Angio Chest W/cm &/or Wo Cm  12/27/2013   CLINICAL DATA:  Chest pain radiating to back question aortic dissection, history coronary artery disease post MI, hypertension, former smoker  EXAM: CT ANGIOGRAPHY CHEST, ABDOMEN AND PELVIS  TECHNIQUE: Multidetector  CT imaging through the chest, abdomen and pelvis was performed using the standard protocol during bolus administration of intravenous contrast. Multiplanar reconstructed images and MIPs were obtained and reviewed to evaluate the vascular anatomy. Precontrast images of the thorax were also obtained.  CONTRAST:  100 ml Omnipaque 350 IV.  COMPARISON:  None  FINDINGS: CTA CHEST FINDINGS  Atherosclerotic calcifications aorta and coronary arteries with question coronary stents.  No intramural hematoma identified on precontrast images.  Normal aortic enhancement following contrast without aortic aneurysm or dissection.  Pulmonary arteries appear grossly patent without evidence of pulmonary embolism on exam optimized for aortic imaging.  Minimally enlarged LEFT hilar lymph node 13 mm short axis image 73.  Small hiatal hernia.  Dependent atelectasis in both lungs.  No acute infiltrate, pleural effusion or pneumothorax.  Bone island T6 vertebral body.  Review of the MIP images confirms the above findings.  CTA ABDOMEN AND PELVIS FINDINGS  Scattered atherosclerotic calcifications without aneurysm or dissection.  Normal variant separate origin of common hepatic artery from aorta.  Mild narrowing of proximal SMA and origin of celiac artery.  Large gallstones dependently in gallbladder.  Liver, spleen, pancreas, kidneys, and adrenal glands normal appearance.  Normal appendix.  Scattered diverticulosis of the descending and sigmoid colon without diverticulitis.  Stomach and bowel loops otherwise unremarkable for technique.  No mass, adenopathy, free fluid or  inflammatory process.  BILATERAL inguinal hernias containing fat.  No acute osseous findings.  Review of the MIP images confirms the above findings.  IMPRESSION: Scattered atherosclerotic disease changes without evidence of aneurysm or dissection.  Small hiatal hernia.  Single minimally enlarged nonspecific LEFT hilar lymph node.  Cholelithiasis.  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  BILATERAL inguinal hernias containing fat.  Distal colonic diverticulosis without evidence of diverticulitis.   Electronically Signed   By: Lavonia Dana M.D.   On: 12/27/2013 13:34   Mr Brain Wo Contrast  12/27/2013   CLINICAL DATA:  Acute onset of right lower extremity weakness. Former smoker. Hyperlipidemia. Hypertension. Seizures.  EXAM: MRI HEAD WITHOUT CONTRAST  MRA HEAD WITHOUT CONTRAST  TECHNIQUE: Multiplanar, multiecho pulse sequences of the brain and surrounding structures were obtained without intravenous contrast. Angiographic images of the head were obtained using MRA technique without contrast.  COMPARISON:  12/27/2013 head CT.  01/26/2007 brain MR.  FINDINGS: MRI HEAD FINDINGS  No acute infarct.  No intracranial hemorrhage.  Very mild nonspecific white matter type changes probably related to result of small vessel disease.  No intracranial mass lesion noted on this unenhanced exam.  Mild to moderate paranasal sinus mucosal thickening with opacification of majority of ethmoid sinus air cells. No obvious intracranial or intraorbital extension.  Cervical medullary junction, pituitary region, pineal region and orbital structures unremarkable.  Major intracranial vascular structures are patent.  MRA HEAD FINDINGS  Anterior circulation without medium or large size vessel significant stenosis or occlusion. Mild branch vessel irregularity most notable involving middle cerebral artery branches.  Fetal type contribution to the left posterior cerebral artery.  Ectatic vertebral arteries and basilar artery without high-grade stenosis.  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  Nonvisualized left anterior inferior cerebral artery.  Mild irregularity and narrowing involving portions of the superior cerebellar artery, posterior cerebral artery distal branches, right anterior inferior cerebellar artery and posterior inferior cerebellar arteries.  IMPRESSION: MRI  HEAD:  No acute infarct.  Very mild nonspecific white matter type changes probably related to result of small vessel disease.  Mild to moderate paranasal sinus mucosal thickening with opacification of majority of ethmoid sinus air cells.  MRA HEAD:  Mild branch vessel irregularity as noted above.   Electronically Signed   By: Chauncey Cruel M.D.   On: 12/27/2013 17:56   Dg Chest Port 1 View  12/27/2013   CLINICAL DATA:  Stroke  EXAM: PORTABLE CHEST - 1 VIEW  COMPARISON:  CT ANGIO CHEST W/CM &/OR WO/CM dated 12/27/2013  FINDINGS: The heart size and mediastinal contours are within normal limits. Both lungs are clear. The visualized skeletal structures are unremarkable.  IMPRESSION: No active disease.   Electronically Signed   By: Kathreen Devoid   On: 12/27/2013 13:56   Mr Jodene Nam Head/brain Wo Cm  12/27/2013   CLINICAL DATA:  Acute onset of right lower extremity weakness. Former smoker. Hyperlipidemia. Hypertension. Seizures.  EXAM: MRI HEAD WITHOUT CONTRAST  MRA HEAD WITHOUT CONTRAST  TECHNIQUE: Multiplanar, multiecho pulse sequences of the brain and surrounding structures were obtained without intravenous contrast. Angiographic images of the head were obtained using MRA technique without contrast.  COMPARISON:  12/27/2013 head CT.  01/26/2007 brain MR.  FINDINGS: MRI HEAD FINDINGS  No acute infarct.  No intracranial hemorrhage.  Very mild nonspecific white matter type changes probably related to result of small vessel disease.  No intracranial mass lesion noted on this unenhanced exam.  Mild to moderate  paranasal sinus mucosal thickening with opacification of majority of ethmoid sinus air cells. No obvious intracranial or intraorbital extension.  Cervical medullary junction, pituitary region, pineal region and orbital structures unremarkable.  Major intracranial vascular structures are patent.  MRA HEAD FINDINGS  Anterior circulation without medium or large size vessel significant stenosis or occlusion. Mild branch  vessel irregularity most notable involving middle cerebral artery branches.  Fetal type contribution to the left posterior cerebral artery.  Ectatic vertebral arteries and basilar artery without high-grade stenosis.  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  Nonvisualized left anterior inferior cerebral artery.  Mild irregularity and narrowing involving portions of the superior cerebellar artery, posterior cerebral artery distal branches, right anterior inferior cerebellar artery and posterior inferior cerebellar arteries.  IMPRESSION: MRI HEAD:  No acute infarct.  Very mild nonspecific white matter type changes probably related to result of small vessel disease.  Mild to moderate paranasal sinus mucosal thickening with opacification of majority of ethmoid sinus air cells.  MRA HEAD:  Mild branch vessel irregularity as noted above.   Electronically Signed   By: Chauncey Cruel M.D.   On: 12/27/2013 17:56   Ct Cta Abd/pel W/cm &/or W/o Cm  12/27/2013   CLINICAL DATA:  Chest pain radiating to back question aortic dissection, history coronary artery disease post MI, hypertension, former smoker  EXAM: CT ANGIOGRAPHY CHEST, ABDOMEN AND PELVIS  TECHNIQUE: Multidetector CT imaging through the chest, abdomen and pelvis was performed using the standard protocol during bolus administration of intravenous contrast. Multiplanar reconstructed images and MIPs were obtained and reviewed to evaluate the vascular anatomy. Precontrast images of the thorax were also obtained.  CONTRAST:  100 ml Omnipaque 350 IV.  COMPARISON:  None  FINDINGS: CTA CHEST FINDINGS  Atherosclerotic calcifications aorta and coronary arteries with question coronary stents.  No intramural hematoma identified on precontrast images.  Normal aortic enhancement following contrast without aortic aneurysm or dissection.  Pulmonary arteries appear grossly patent without evidence of pulmonary embolism on exam optimized for aortic imaging.   Minimally enlarged LEFT hilar lymph node 13 mm short axis image 73.  Small hiatal hernia.  Dependent atelectasis in both lungs.  No acute infiltrate, pleural effusion or pneumothorax.  Bone island T6 vertebral body.  Review of the MIP images confirms the above findings.  CTA ABDOMEN AND PELVIS FINDINGS  Scattered atherosclerotic calcifications without aneurysm or dissection.  Normal variant separate origin of common hepatic artery from aorta.  Mild narrowing of proximal SMA and origin of celiac artery.  Large gallstones dependently in gallbladder.  Liver, spleen, pancreas, kidneys, and adrenal glands normal appearance.  Normal appendix.  Scattered diverticulosis of the descending and sigmoid colon without diverticulitis.  Stomach and bowel loops otherwise unremarkable for technique.  No mass, adenopathy, free fluid or inflammatory process.  BILATERAL inguinal hernias containing fat.  No acute osseous findings.  Review of the MIP images confirms the above findings.  IMPRESSION: Scattered atherosclerotic disease changes without evidence of aneurysm or dissection.  Small hiatal hernia.  Single minimally enlarged nonspecific LEFT hilar lymph node.  Cholelithiasis.  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  BILATERAL inguinal hernias containing fat.  Distal colonic diverticulosis without evidence of diverticulitis.   Electronically Signed   By: Lavonia Dana M.D.   On: 12/27/2013 13:34    Post Admission Physician Evaluation: 1. Functional deficits secondary  to left subcortical infarct not seen on MRI. 2. Patient is admitted to receive collaborative, interdisciplinary care between the physiatrist, rehab  nursing staff, and therapy team. 3. Patient's level of medical complexity and substantial therapy needs in context of that medical necessity cannot be provided at a lesser intensity of care such as a SNF. 4. Patient has experienced substantial functional loss from his/her baseline which was documented  above under the "Functional History" and "Functional Status" headings.  Judging by the patient's diagnosis, physical exam, and functional history, the patient has potential for functional progress which will result in measurable gains while on inpatient rehab.  These gains will be of substantial and practical use upon discharge  in facilitating mobility and self-care at the household level. 5. Physiatrist will provide 24 hour management of medical needs as well as oversight of the therapy plan/treatment and provide guidance as appropriate regarding the interaction of the two. 6. 24 hour rehab nursing will assist with bladder management, bowel management, safety, skin/wound care, disease management, medication administration and patient education  and help integrate therapy concepts, techniques,education, etc. 7. PT will assess and treat for/with: Lower extremity strength, range of motion, stamina, balance, functional mobility, safety, adaptive techniques and equipment, NMR, visual-perceptual awareness, education, egosupport.   Goals are: mod I. 8. OT will assess and treat for/with: ADL's, functional mobility, safety, upper extremity strength, adaptive techniques and equipment, NMR, visual-perceptual awareness, education, ego support.   Goals are: mod I. 9. SLP will assess and treat for/with: n/a.  Goals are: n/a. 10. Case Management and Social Worker will assess and treat for psychological issues and discharge planning. 11. Team conference will be held weekly to assess progress toward goals and to determine barriers to discharge. 12. Patient will receive at least 3 hours of therapy per day at least 5 days per week. 13. ELOS: 9-12 days       14. Prognosis:  excellent   Medical Problem List and Plan: 1. 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 DVT Prophylaxis/Anticoagulation: SCDs. Monitor for any signs of DVT 3. Pain Management: Oxycodone as needed. 4. Mood/depression. Paxil  20 mg daily. Provide emotional support 5. Neuropsych: This patient is capable of making decisions on his own behalf. 6. Hypertension. Lisinopril 5 mg daily. Monitor with increased mobility 7. CAD status post stenting x3. Continue aspirin Plavix therapy. No chest pain and shortness of breath. Cardiac enzymes negative. 8. Hyperlipidemia. [place] 9. Resolving bronchitis: robitussin prn, IS  Meredith Staggers, MD, [facility]  12/29/2013

## 2013-12-29 NOTE — Consult Note (Signed)
Physical Medicine and Rehabilitation Consult Reason for Consult: CVA Referring Physician: Dr. [name]   HPI: Justin Wells is a 63 y.o. right-handed male with history of hypertension, CAD status post stent x3, TIA, ischemic infarct in the setting of acute MI 2014. Independent prior to admission and driving living with his wife. Patient admitted 12/27/2013 with right-sided weakness as well as dizziness and slurred speech. MRI of the brain with no acute findings. Cardiac enzymes negative. MRA of the head without stenosis or occlusion. Echocardiogram with ejection fraction of 65% grade 1 diastolic dysfunction. Patient did receive TPA. CTA of the head with posterior circulation mild branch vessel irregularity. CTA of the neck with plaque carotid bifurcation bilaterally with less than 50% diameter stenosis. Neurology services followup with suspect left brain posterior circulation small infarct not visualized on MRI. Maintained on aspirin and Plavix for CVA prophylaxis. Patient is tolerating a regular diet. Physical and occupational therapy evaluations completed with recommendations of physical medicine and rehabilitation consult.  No issues with weakness PTA per pt today, working full time as Psychologist, occupational  Review of Systems  Musculoskeletal: Positive for myalgias.  Neurological: Positive for dizziness, speech change, weakness and headaches.  All other systems reviewed and are negative.  Past Medical History  Diagnosis Date  . Peripheral arterial disease   . Hyperlipidemia   . Coronary artery disease   . Hypertension   . Barrett's esophagus   . Gastroesophageal reflux   . Cerebrovascular disease   . Myocardial infarction 2009    "during cardiac cath" (01/27/2013)  . H/O hiatal hernia   . ZOXWRUEA(540.9)     "probably monthly" (01/27/2013)  . Seizures     "when I had my heart attack in 2009" (01/27/2013)  . Stroke 2009  2010    "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, weak right leg and  hand; I've had 2 or 3 strokes; can't remember the dates" (01/27/2013)  . Arthritis     "hands" (01/27/2013)   Past Surgical History  Procedure Laterality Date  . Laparoscopic gastric banding  2010  . Cardiac catheterization    . Coronary angioplasty with stent placement  11/02/2007    "2" (01/27/2013)  . Laparoscopic nissen fundoplication  ?2001  . Femoral artery stent Right 01/26/2013  . Diagnostic laparoscopy      "twice after nissen; it kept coming apart " (01/27/2013)   Family History  Problem Relation Age of Onset  . Heart disease Mother     Heart Disease before age 94  . Hypertension Mother   . Heart attack Mother   . Heart attack Father    Social History:  reports that he quit smoking about 12 years ago. His smoking use included Cigarettes. He has a 70 pack-year smoking history. He has never used smokeless tobacco. He reports that he does not drink alcohol or use illicit drugs. Allergies: No Known Allergies Medications Prior to Admission  Medication Sig Dispense Refill  . aspirin 325 MG EC tablet Take 325 mg by mouth daily.      . clopidogrel (PLAVIX) 75 MG tablet Take 1 tablet (75 mg total) by mouth daily.  90 tablet  3  . fish oil-omega-3 fatty acids 1000 MG capsule Take 1 g by mouth 2 (two) times daily.       Marland Kitchen lisinopril (PRINIVIL,ZESTRIL) 5 MG tablet Take 5 mg by mouth daily.      . Multiple Vitamins-Minerals (ONE-A-DAY MENS VITACRAVES PO) Take by mouth daily.      Marland Kitchen  oxyCODONE-acetaminophen (PERCOCET/ROXICET) 5-325 MG per tablet Take 1 tablet by mouth every 6 (six) hours as needed for pain.       Marland Kitchen. PARoxetine (PAXIL) 20 MG tablet Take 20 mg by mouth every morning.      . ranitidine (ZANTAC) 150 MG tablet Take 150 mg by mouth 2 (two) times daily.      . vitamin B-12 (CYANOCOBALAMIN) 500 MCG tablet Take 500 mcg by mouth daily.      . nitroGLYCERIN (NITROSTAT) 0.4 MG SL tablet Place 0.4 mg under the tongue every 5 (five) minutes as needed for chest pain.        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  Functional History: Prior Function Level of Independence: Independent Functional Status:  Mobility: Bed Mobility Overal bed mobility: Modified Independent General bed mobility comments: increased time, HOB elevated, definite use of hands Transfers Overall transfer level: Needs assistance Equipment used: 1 person hand held assist Transfers: Sit to/from Stand Sit to Stand: Min assist General transfer comment: pt unsteady, guarded due to report "I feel like my R leg is going to give out" Ambulation/Gait Ambulation/Gait assistance: Min assist Ambulation Distance (Feet): 60 Feet Assistive device: Rolling walker (2 wheeled);1 person hand held assist Gait Pattern/deviations: Step-to pattern;Decreased stride length;Antalgic Gait velocity: slow and guarded General Gait Details: pt with report "I feel like my R leg is going to give out one me." Pt very cautious with 1 person HHA. pt with improved step length with RW however dependent on UEs. Attempted to have patient relax bilat UEs however pt with R sided weakness.    ADL: ADL Overall ADL's : Needs assistance/impaired Eating/Feeding: Independent;Sitting Grooming: Set up;Sitting Upper Body Bathing: Set up;Sitting Lower Body Bathing: Minimal assistance;Sit to/from stand Upper Body Dressing : Set up;Sitting Lower Body Dressing: Minimal assistance;Sit to/from stand Toilet Transfer: Minimal assistance;Ambulation Toileting- Clothing Manipulation and Hygiene: Minimal assistance Functional mobility during ADLs: Minimal assistance  Cognition: Cognition Overall  Cognitive Status: Within Functional Limits for tasks assessed Orientation Level: Oriented X4 Cognition Arousal/Alertness: Awake/alert Behavior During Therapy: WFL for tasks assessed/performed Overall Cognitive Status: Within Functional Limits for tasks assessed  Blood pressure 121/73, pulse 63, temperature 98.5 F (36.9 C), temperature source Oral, resp. rate 15, height 5\' 10"  (1.778 m), weight 89.6 kg (197 lb 8.5 oz), SpO2 97.00%. Physical Exam  Vitals reviewed. Constitutional: He is oriented to person, place, and time.  HENT:  Head: Normocephalic.  Eyes: EOM are normal.  Neck: Normal range of motion. Neck supple. No thyromegaly present.  Cardiovascular: Normal rate and regular rhythm.   Respiratory: Effort normal and breath sounds normal. No respiratory distress.  GI: Soft. Bowel sounds are normal. He exhibits no distension.  Neurological: He is alert and oriented to person, place, and time.  Follows full commands. Makes good eye contact with examiner.  Skin: Skin is warm and dry.   4 minus/5 right deltoid, bicep, tricep, grip 5/5 left deltoid, bicep, tricep, grip 4/5 right hip flexor knee extensor ankle dorsiflexor 5/5 left hip flexor knee extensor ankle dorsiflex Sensory normal light touch in bilateral upper and lower extremities  No results found for this or any previous visit (from the past 24 hour(s)). Ct Angio Head W/cm &/or Wo Cm  12/28/2013   CLINICAL DATA:  Right-sided weakness has resolved. Persistent headaches. Hyperlipidemia. Hypertension. Seizures. Prior myocardial infarction.  EXAM: CT ANGIOGRAPHY HEAD AND NECK  TECHNIQUE: Multidetector CT imaging of the head and neck was performed using the standard protocol during bolus administration of intravenous contrast. Multiplanar CT image reconstructions and MIPs were obtained to evaluate the vascular anatomy. Carotid stenosis measurements (when applicable) are obtained utilizing NASCET criteria, using the distal internal  carotid diameter as the denominator.  CONTRAST:  50mL OMNIPAQUE IOHEXOL 350 MG/ML SOLN  COMPARISON:  12/27/2013 MR brain and MR angiogram circle Willis.  FINDINGS: CTA HEAD FINDINGS  No intracranial hemorrhage.  No CT evidence of large acute infarct.  No intracranial enhancing lesion.  Opacification/mucosal thickening paranasal sinuses most notable left maxillary sinus with there is an air-fluid level. Acute sinusitis not excluded. Almost complete opacification of majority of the ethmoid sinus air cells without evidence of intraorbital extension of sinus disease.  Calcified cavernous segment internal carotid artery bilaterally with mild narrowing.  Mild irregularity middle cerebral artery branch vessels.  Fetal type origin of the left posterior communicating artery.  Slightly ectatic vertebral arteries without high-grade stenosis.  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  Posterior circulation mild branch vessel irregularity.  Review of the MIP images confirms the above findings.  CTA NECK FINDINGS  Aberrant origin of the right subclavian artery.  Right vertebral artery and right common carotid artery with common origin.  Left vertebral artery arises directly from the aortic arch with mild narrowing proximal aspect.  Plaque carotid bifurcation bilaterally with less than 50% diameter stenosis bilaterally.  No worrisome primary neck mass identified.  Scattered normal to top-normal size lymph nodes throughout the neck of questionable significance/etiology.  Mild cervical spondylotic changes with various degrees of spinal stenosis and foraminal narrowing.  Circumferential thickening of the cervical/ upper thoracic esophagus may be related to under distension although evaluation is limited.  Caries of residual teeth.  Review of the MIP images confirms the above findings.  IMPRESSION: CTA HEAD:  Opacification/mucosal thickening paranasal sinuses most notable left maxillary sinus with there is an  air-fluid level. Acute sinusitis not excluded. Almost complete opacification of majority of the ethmoid sinus air cells without evidence of intraorbital extension of sinus disease.  Calcified cavernous segment internal carotid artery bilaterally with mild narrowing.  Mild irregularity middle cerebral artery branch vessels.  Fetal type origin of the left posterior communicating artery.  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  Posterior circulation mild branch vessel irregularity.  CTA NECK :  Aberrant origin of the right subclavian artery.  Right vertebral artery and right common carotid artery with common origin.  Left vertebral artery arises directly from the aortic arch with mild narrowing proximal aspect.  Plaque carotid bifurcation bilaterally with less than 50% diameter stenosis bilaterally.  Scattered normal to top-normal size lymph nodes throughout the neck of questionable significance/etiology.  Circumferential thickening of the cervical/ upper thoracic esophagus may be related to under distension although evaluation is limited.   Electronically Signed   By: Bridgett Larsson M.D.   On: 12/28/2013 13:09   Ct Head Wo Contrast  12/27/2013   CLINICAL DATA:  Right lower extremity weakness of acute onset  EXAM: CT HEAD WITHOUT CONTRAST  TECHNIQUE: Contiguous axial images were obtained from the base of the skull through the vertex without intravenous contrast. Study was obtained within 24 hr of patient's arrival at the emergency department.  COMPARISON:  September 08, 2007  FINDINGS: The ventricles are  normal in size and configuration. There is no appreciable mass, hemorrhage, extra-axial fluid collection, or midline shift. There is subtle decreased attenuation in the lower right mid brain and upper right pons, concerning for recent infarct in this area. Elsewhere, gray-white compartments appear normal. There is mild stable increased attenuation in both middle cerebral arteries, a stable  symmetric finding of doubtful significance.  Bony calvarium appears intact. The mastoid air cells are clear. There is mucosal thickening in both maxillary antra as well as extensive ethmoid sinus disease bilaterally. There is moderate mucosal thickening in the inferior frontal sinuses bilaterally as well as in the left and right sphenoid sinuses.  IMPRESSION: Question recent infarct in the lower right mid brain and upper right pons. Elsewhere gray-white compartments appear normal. No acute hemorrhage or mass. Multifocal paranasal sinus disease, most pronounced in the ethmoid complexes bilaterally.  Critical Value/emergent results were called by telephone at the time of interpretation on 12/27/2013 at 12:52 PM to Dr. Cyril Mourningamillo, neurology3, who verbally acknowledged these results.   Electronically Signed   By: Bretta BangWilliam  Woodruff M.D.   On: 12/27/2013 12:56   Ct Angio Neck W/cm &/or Wo/cm  12/28/2013   CLINICAL DATA:  Right-sided weakness has resolved. Persistent headaches. Hyperlipidemia. Hypertension. Seizures. Prior myocardial infarction.  EXAM: CT ANGIOGRAPHY HEAD AND NECK  TECHNIQUE: Multidetector CT imaging of the head and neck was performed using the standard protocol during bolus administration of intravenous contrast. Multiplanar CT image reconstructions and MIPs were obtained to evaluate the vascular anatomy. Carotid stenosis measurements (when applicable) are obtained utilizing NASCET criteria, using the distal internal carotid diameter as the denominator.  CONTRAST:  50mL OMNIPAQUE IOHEXOL 350 MG/ML SOLN  COMPARISON:  12/27/2013 MR brain and MR angiogram circle Willis.  FINDINGS: CTA HEAD FINDINGS  No intracranial hemorrhage.  No CT evidence of large acute infarct.  No intracranial enhancing lesion.  Opacification/mucosal thickening paranasal sinuses most notable left maxillary sinus with there is an air-fluid level. Acute sinusitis not excluded. Almost complete opacification of majority of the ethmoid sinus  air cells without evidence of intraorbital extension of sinus disease.  Calcified cavernous segment internal carotid artery bilaterally with mild narrowing.  Mild irregularity middle cerebral artery branch vessels.  Fetal type origin of the left posterior communicating artery.  Slightly ectatic vertebral arteries without high-grade stenosis.  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  Posterior circulation mild branch vessel irregularity.  Review of the MIP images confirms the above findings.  CTA NECK FINDINGS  Aberrant origin of the right subclavian artery.  Right vertebral artery and right common carotid artery with common origin.  Left vertebral artery arises directly from the aortic arch with mild narrowing proximal aspect.  Plaque carotid bifurcation bilaterally with less than 50% diameter stenosis bilaterally.  No worrisome primary neck mass identified.  Scattered normal to top-normal size lymph nodes throughout the neck of questionable significance/etiology.  Mild cervical spondylotic changes with various degrees of spinal stenosis and foraminal narrowing.  Circumferential thickening of the cervical/ upper thoracic esophagus may be related to under distension although evaluation is limited.  Caries of residual teeth.  Review of the MIP images confirms the above findings.  IMPRESSION: CTA HEAD:  Opacification/mucosal thickening paranasal sinuses most notable left maxillary sinus with there is an air-fluid level. Acute sinusitis not excluded. Almost complete opacification of majority of the ethmoid sinus air cells without evidence of intraorbital extension of sinus disease.  Calcified cavernous segment internal carotid artery bilaterally with mild narrowing.  Mild irregularity middle  cerebral artery branch vessels.  Fetal type origin of the left posterior communicating artery.  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  Posterior circulation mild branch vessel  irregularity.  CTA NECK :  Aberrant origin of the right subclavian artery.  Right vertebral artery and right common carotid artery with common origin.  Left vertebral artery arises directly from the aortic arch with mild narrowing proximal aspect.  Plaque carotid bifurcation bilaterally with less than 50% diameter stenosis bilaterally.  Scattered normal to top-normal size lymph nodes throughout the neck of questionable significance/etiology.  Circumferential thickening of the cervical/ upper thoracic esophagus may be related to under distension although evaluation is limited.   Electronically Signed   By: Bridgett Larsson M.D.   On: 12/28/2013 13:09   Ct Angio Chest W/cm &/or Wo Cm  12/27/2013   CLINICAL DATA:  Chest pain radiating to back question aortic dissection, history coronary artery disease post MI, hypertension, former smoker  EXAM: CT ANGIOGRAPHY CHEST, ABDOMEN AND PELVIS  TECHNIQUE: Multidetector CT imaging through the chest, abdomen and pelvis was performed using the standard protocol during bolus administration of intravenous contrast. Multiplanar reconstructed images and MIPs were obtained and reviewed to evaluate the vascular anatomy. Precontrast images of the thorax were also obtained.  CONTRAST:  100 ml Omnipaque 350 IV.  COMPARISON:  None  FINDINGS: CTA CHEST FINDINGS  Atherosclerotic calcifications aorta and coronary arteries with question coronary stents.  No intramural hematoma identified on precontrast images.  Normal aortic enhancement following contrast without aortic aneurysm or dissection.  Pulmonary arteries appear grossly patent without evidence of pulmonary embolism on exam optimized for aortic imaging.  Minimally enlarged LEFT hilar lymph node 13 mm short axis image 73.  Small hiatal hernia.  Dependent atelectasis in both lungs.  No acute infiltrate, pleural effusion or pneumothorax.  Bone island T6 vertebral body.  Review of the MIP images confirms the above findings.  CTA ABDOMEN AND  PELVIS FINDINGS  Scattered atherosclerotic calcifications without aneurysm or dissection.  Normal variant separate origin of common hepatic artery from aorta.  Mild narrowing of proximal SMA and origin of celiac artery.  Large gallstones dependently in gallbladder.  Liver, spleen, pancreas, kidneys, and adrenal glands normal appearance.  Normal appendix.  Scattered diverticulosis of the descending and sigmoid colon without diverticulitis.  Stomach and bowel loops otherwise unremarkable for technique.  No mass, adenopathy, free fluid or inflammatory process.  BILATERAL inguinal hernias containing fat.  No acute osseous findings.  Review of the MIP images confirms the above findings.  IMPRESSION: Scattered atherosclerotic disease changes without evidence of aneurysm or dissection.  Small hiatal hernia.  Single minimally enlarged nonspecific LEFT hilar lymph node.  Cholelithiasis.  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  BILATERAL inguinal hernias containing fat.  Distal colonic diverticulosis without evidence of diverticulitis.   Electronically Signed   By: Ulyses Southward M.D.   On: 12/27/2013 13:34   Mr Brain Wo Contrast  12/27/2013   CLINICAL DATA:  Acute onset of right lower extremity weakness. Former smoker. Hyperlipidemia. Hypertension. Seizures.  EXAM: MRI HEAD WITHOUT CONTRAST  MRA HEAD WITHOUT CONTRAST  TECHNIQUE: Multiplanar, multiecho pulse sequences of the brain and surrounding structures were obtained without intravenous contrast. Angiographic images of the head were obtained using MRA technique without contrast.  COMPARISON:  12/27/2013 head CT.  01/26/2007 brain MR.  FINDINGS: MRI HEAD FINDINGS  No acute infarct.  No intracranial hemorrhage.  Very mild nonspecific white matter type changes probably related to result  of small vessel disease.  No intracranial mass lesion noted on this unenhanced exam.  Mild to moderate paranasal sinus mucosal thickening with opacification of majority of  ethmoid sinus air cells. No obvious intracranial or intraorbital extension.  Cervical medullary junction, pituitary region, pineal region and orbital structures unremarkable.  Major intracranial vascular structures are patent.  MRA HEAD FINDINGS  Anterior circulation without medium or large size vessel significant stenosis or occlusion. Mild branch vessel irregularity most notable involving middle cerebral artery branches.  Fetal type contribution to the left posterior cerebral artery.  Ectatic vertebral arteries and basilar artery without high-grade stenosis.  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  Nonvisualized left anterior inferior cerebral artery.  Mild irregularity and narrowing involving portions of the superior cerebellar artery, posterior cerebral artery distal branches, right anterior inferior cerebellar artery and posterior inferior cerebellar arteries.  IMPRESSION: MRI HEAD:  No acute infarct.  Very mild nonspecific white matter type changes probably related to result of small vessel disease.  Mild to moderate paranasal sinus mucosal thickening with opacification of majority of ethmoid sinus air cells.  MRA HEAD:  Mild branch vessel irregularity as noted above.   Electronically Signed   By: Bridgett Larsson M.D.   On: 12/27/2013 17:56   Dg Chest Port 1 View  12/27/2013   CLINICAL DATA:  Stroke  EXAM: PORTABLE CHEST - 1 VIEW  COMPARISON:  CT ANGIO CHEST W/CM &/OR WO/CM dated 12/27/2013  FINDINGS: The heart size and mediastinal contours are within normal limits. Both lungs are clear. The visualized skeletal structures are unremarkable.  IMPRESSION: No active disease.   Electronically Signed   By: Elige Ko   On: 12/27/2013 13:56   Mr Maxine Glenn Head/brain Wo Cm  12/27/2013   CLINICAL DATA:  Acute onset of right lower extremity weakness. Former smoker. Hyperlipidemia. Hypertension. Seizures.  EXAM: MRI HEAD WITHOUT CONTRAST  MRA HEAD WITHOUT CONTRAST  TECHNIQUE: Multiplanar,  multiecho pulse sequences of the brain and surrounding structures were obtained without intravenous contrast. Angiographic images of the head were obtained using MRA technique without contrast.  COMPARISON:  12/27/2013 head CT.  01/26/2007 brain MR.  FINDINGS: MRI HEAD FINDINGS  No acute infarct.  No intracranial hemorrhage.  Very mild nonspecific white matter type changes probably related to result of small vessel disease.  No intracranial mass lesion noted on this unenhanced exam.  Mild to moderate paranasal sinus mucosal thickening with opacification of majority of ethmoid sinus air cells. No obvious intracranial or intraorbital extension.  Cervical medullary junction, pituitary region, pineal region and orbital structures unremarkable.  Major intracranial vascular structures are patent.  MRA HEAD FINDINGS  Anterior circulation without medium or large size vessel significant stenosis or occlusion. Mild branch vessel irregularity most notable involving middle cerebral artery branches.  Fetal type contribution to the left posterior cerebral artery.  Ectatic vertebral arteries and basilar artery without high-grade stenosis.  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  Nonvisualized left anterior inferior cerebral artery.  Mild irregularity and narrowing involving portions of the superior cerebellar artery, posterior cerebral artery distal branches, right anterior inferior cerebellar artery and posterior inferior cerebellar arteries.  IMPRESSION: MRI HEAD:  No acute infarct.  Very mild nonspecific white matter type changes probably related to result of small vessel disease.  Mild to moderate paranasal sinus mucosal thickening with opacification of majority of ethmoid sinus air cells.  MRA HEAD:  Mild branch vessel irregularity as noted above.   Electronically Signed   By: Bridgett Larsson  M.D.   On: 12/27/2013 17:56   Ct Cta Abd/pel W/cm &/or W/o Cm  12/27/2013   CLINICAL DATA:  Chest pain  radiating to back question aortic dissection, history coronary artery disease post MI, hypertension, former smoker  EXAM: CT ANGIOGRAPHY CHEST, ABDOMEN AND PELVIS  TECHNIQUE: Multidetector CT imaging through the chest, abdomen and pelvis was performed using the standard protocol during bolus administration of intravenous contrast. Multiplanar reconstructed images and MIPs were obtained and reviewed to evaluate the vascular anatomy. Precontrast images of the thorax were also obtained.  CONTRAST:  100 ml Omnipaque 350 IV.  COMPARISON:  None  FINDINGS: CTA CHEST FINDINGS  Atherosclerotic calcifications aorta and coronary arteries with question coronary stents.  No intramural hematoma identified on precontrast images.  Normal aortic enhancement following contrast without aortic aneurysm or dissection.  Pulmonary arteries appear grossly patent without evidence of pulmonary embolism on exam optimized for aortic imaging.  Minimally enlarged LEFT hilar lymph node 13 mm short axis image 73.  Small hiatal hernia.  Dependent atelectasis in both lungs.  No acute infiltrate, pleural effusion or pneumothorax.  Bone island T6 vertebral body.  Review of the MIP images confirms the above findings.  CTA ABDOMEN AND PELVIS FINDINGS  Scattered atherosclerotic calcifications without aneurysm or dissection.  Normal variant separate origin of common hepatic artery from aorta.  Mild narrowing of proximal SMA and origin of celiac artery.  Large gallstones dependently in gallbladder.  Liver, spleen, pancreas, kidneys, and adrenal glands normal appearance.  Normal appendix.  Scattered diverticulosis of the descending and sigmoid colon without diverticulitis.  Stomach and bowel loops otherwise unremarkable for technique.  No mass, adenopathy, free fluid or inflammatory process.  BILATERAL inguinal hernias containing fat.  No acute osseous findings.  Review of the MIP images confirms the above findings.  IMPRESSION: Scattered atherosclerotic  disease changes without evidence of aneurysm or dissection.  Small hiatal hernia.  Single minimally enlarged nonspecific LEFT hilar lymph node.  Cholelithiasis.  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  BILATERAL inguinal hernias containing fat.  Distal colonic diverticulosis without evidence of diverticulitis.   Electronically Signed   By: Ulyses Southward M.D.   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 Does the need for close, 24 hr/day medical supervision in concert with the patient's rehab needs make it unreasonable for this patient to be served in a less intensive setting? Yes 2. Co-Morbidities requiring supervision/potential complications: Hypertension, peripheral vascular disease 3. Due to bladder management, bowel management, safety, skin/wound care, disease management, medication administration, pain management and patient education, does the patient require 24 hr/day rehab nursing? Yes 4. Does the patient require coordinated care of a physician, rehab nurse, PT (1-2 hrs/day, 5 days/week) and OT (1-2 hrs/day, 5 days/week) to address physical and functional deficits in the context of the above medical diagnosis(es)? Yes Addressing deficits in the following areas: balance, endurance, locomotion, strength, transferring, bowel/bladder control, bathing, dressing and toileting 5. Can the patient actively participate in an intensive therapy program of at least 3 hrs of therapy per day at least 5 days per week? Yes 6. The potential for patient to make measurable gains while on inpatient rehab is excellent 7. Anticipated functional outcomes upon discharge from inpatient rehab are modified independent  with PT, modified independent with OT, n/a with  SLP. 8. Estimated rehab length of stay to  reach the above functional goals is: 7 days 9. Does the patient have adequate social supports to accommodate these discharge functional goals? Yes 10. Anticipated D/C setting: Home 11. Anticipated post D/C treatments: Outpatient therapy 12. Overall Rehab/Functional Prognosis: excellent  RECOMMENDATIONS: This patient's condition is appropriate for continued rehabilitative care in the following setting: CIR Patient has agreed to participate in recommended program. Yes Note that insurance prior authorization may be required for reimbursement for recommended care.  Comment: Patient wishes to go over MRA,CTA with neuro team, told at [facility] that he had blockage of blood vessel in brain    12/29/2013

## 2013-12-29 NOTE — Progress Notes (Signed)
Rehab admissions - I met with patient, his wife and a friend.  I gave them rehab brochures and explained inpatient rehab program.  Patient and wife are in agreement to inpatient rehab admission.  Bed available and will admit to inpatient rehab today.  Call me for questions.  #[phone]

## 2013-12-29 NOTE — Interval H&P Note (Signed)
Justin Wells was admitted today to Inpatient Rehabilitation with the diagnosis of left subcortical infarct.  The patient's history has been reviewed, patient examined, and there is no change in status.  Patient continues to be appropriate for intensive inpatient rehabilitation.  I have reviewed the patient's chart and labs.  Questions were answered to the patient's satisfaction.  Ranelle OysterZachary T Swartz 12/29/2013, 9:19 PM

## 2013-12-29 NOTE — Progress Notes (Signed)
PMR Admission Coordinator Pre-Admission Assessment  Patient: Justin Wells is an 63 y.o., male  MRN: [id number]  DOB: May 05, 1951  Height: 5\' 10"  (177.8 cm)  Weight: 89.6 kg (197 lb 8.5 oz)  Insurance Information  HMO: PPO: PCP: IPA: 80/20: OTHER:  PRIMARY: Medicare Part A only Policy#: [id number] Subscriber: Nadara ModeGeorge Peugh  CM Name: Phone#: Fax#:  Pre-Cert#: Employer: Works as a Mudloggerwelder  Benefits: Phone #: Name: Checked in [employer]  Eff. Date: 01/31/10 Part A only Deduct: $1260 Out of Pocket Max: none Life Max: unlimited  CIR: 100% SNF: 100 days  Outpatient: No coverage Co-Pay:  Home Health: 100% Co-Pay: none  DME: no coverage Co-Pay:  Providers: patient's choice   SECONDARY: [place] Policy#: [id number] Subscriber: Nadara ModeGeorge Rawe  CM Name: Phone#: Fax#:  Pre-Cert#: Employer:  Benefits: Phone #: [phone] Name:  Eff. Date: Deduct: Out of Pocket Max: Life Max:  CIR: SNF:  Outpatient: Co-Pay:  Home Health: Co-Pay:  DME: Co-Pay:  Emergency Contact Information  Contact Information    Name  Relation  Home  Work  Mobile    Wanner,[name]  Spouse  [phone]   [phone]    Lattie Cornslston,Jamie  Daughter  [phone]      Dewitt HoesWarren,Angela  Daughter  [phone]        Current Medical History  Patient Admitting Diagnosis: L [place] Infarct  History of Present Illness: A [age] y.o. right-handed male with history of hypertension, CAD status post stent x3, TIA, ischemic infarct in the setting of acute MI 2014. Independent prior to admission and driving living with his wife. Patient admitted 12/27/2013 with right-sided weakness as well as dizziness and slurred speech. No issues with weakness PTA per pt today, working full time as Psychologist, occupationalwelder. MRI of the brain with no acute findings. Cardiac enzymes negative. MRA of the head without stenosis or occlusion. Echocardiogram with ejection fraction of 65% grade 1 diastolic dysfunction. Patient did receive TPA. CTA of the head with posterior circulation mild  branch vessel irregularity. CTA of the neck with plaque carotid bifurcation bilaterally with less than 50% diameter stenosis. Neurology services followup with suspect left brain posterior circulation small infarct not visualized on MRI. Maintained on aspirin and Plavix for CVA prophylaxis. Patient is tolerating a regular diet. Physical and occupational therapy evaluations completed with recommendations of physical medicine and rehabilitation consult.  Total: 2=NIH  Past Medical History  Past Medical History   Diagnosis  Date   .  Peripheral arterial disease    .  Hyperlipidemia    .  Coronary artery disease    .  Hypertension    .  Barrett's esophagus    .  Gastroesophageal reflux    .  Cerebrovascular disease    .  Myocardial infarction  2009     "during cardiac cath" (01/27/2013)   .  H/O hiatal hernia    .  ZSWFUXNA(355.7Headache(784.0)      "probably monthly" (01/27/2013)   .  Seizures      "when I had my heart attack in 2009" (01/27/2013)   .  Stroke  2009 2010     "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, weak right leg and hand; I've had 2 or 3 strokes; can't remember the dates" (01/27/2013)   .  Arthritis      "hands" (01/27/2013)    Family History  family history includes Heart attack in his father and mother; Heart disease in his mother; Hypertension in his mother.  Prior Rehab/Hospitalizations: [facility]  CVA [facility] yrs ago  Current Medications  Current facility-administered medications:acetaminophen (TYLENOL) suppository 650 mg, 650 mg, Rectal, Q4H PRN, Layne BentonSharon L Biby, NP, 650 mg at 12/27/13 2015; acetaminophen (TYLENOL) tablet 650 mg, 650 mg, Oral, Q4H PRN, Layne BentonSharon L Biby, NP, 650 mg at 12/28/13 1335; aspirin EC tablet 325 mg, 325 mg, Oral, Daily, Layne BentonSharon L Biby, NP, 325 mg at 12/29/13 1000; clopidogrel (PLAVIX) tablet 75 mg, 75 mg, Oral, Daily, Layne BentonSharon L Biby, NP, 75 mg at 12/29/13 1000  famotidine (PEPCID) tablet 20 mg, 20 mg, Oral, BID, Layne BentonSharon L Biby, NP, 20 mg at 12/29/13 0959;  labetalol (NORMODYNE,TRANDATE) injection 10 mg, 10 mg, Intravenous, Q10 min PRN, Wyatt Portelasvaldo Camilo, MD; lisinopril (PRINIVIL,ZESTRIL) tablet 5 mg, 5 mg, Oral, Daily, Layne BentonSharon L Biby, NP, 5 mg at 12/29/13 [id number]; omega-3 acid ethyl esters (LOVAZA) capsule 1 g, 1 g, Oral, BID, Micki RileyPramod S [name], MD, 1 g at 12/29/13 1000  oxyCODONE-acetaminophen (PERCOCET/ROXICET) 5-325 MG per tablet 1 tablet, 1 tablet, Oral, Q6H PRN, Layne BentonSharon L Biby, NP, 1 tablet at 12/29/13 0548; PARoxetine (PAXIL) tablet 20 mg, 20 mg, Oral, Daily, Layne BentonSharon L Biby, NP, 20 mg at 12/29/13 1000; senna-docusate (Senokot-S) tablet 1 tablet, 1 tablet, Oral, QHS PRN, Wyatt Portelasvaldo Camilo, MD  Patients Current Diet: General  Precautions / Restrictions  Precautions  Precautions: Fall  Restrictions  Weight Bearing Restrictions: No  Prior Activity Level  Community (5-7x/wk): Went out daily.  Home Assistive Devices / Equipment  Home Assistive Devices/Equipment: None  Home Equipment: None  Prior Functional Level  Prior Function  Level of Independence: Independent  Current Functional Level  Cognition  Overall Cognitive Status: Within Functional Limits for tasks assessed  Orientation Level: Oriented X4   Extremity Assessment  (includes Sensation/Coordination)     ADLs  Overall ADL's : Needs assistance/impaired  Eating/Feeding: Independent;Sitting  Grooming: Set up;Sitting  Upper Body Bathing: Set up;Sitting  Lower Body Bathing: Minimal assistance;Sit to/from stand  Upper Body Dressing : Set up;Sitting  Lower Body Dressing: Minimal assistance;Sit to/from stand  Toilet Transfer: Minimal assistance;Ambulation  Toileting- Clothing Manipulation and Hygiene: Minimal assistance  Functional mobility during ADLs: Minimal assistance   Mobility  Overal bed mobility: Modified Independent  General bed mobility comments: increased time, HOB elevated, definite use of hands   Transfers  Overall transfer level: Needs assistance  Equipment used: 1 person hand held  assist  Transfers: Sit to/from Stand  Sit to Stand: Min assist  General transfer comment: pt unsteady, guarded due to report "I feel like my R leg is going to give out"   Ambulation / Gait / Stairs / Wheelchair Mobility  Ambulation/Gait  Ambulation/Gait assistance: Occupational psychologistMin assist  Ambulation Distance (Feet): 60 Feet  Assistive device: Rolling walker (2 wheeled);1 person hand held assist  Gait Pattern/deviations: Step-to pattern;Decreased stride length;Antalgic  Gait velocity: slow and guarded  General Gait Details: pt with report "I feel like my R leg is going to give out one me." Pt very cautious with 1 person HHA. pt with improved step length with RW however dependent on UEs. Attempted to have patient relax bilat UEs however pt with R sided weakness.   Posture / Balance    Special needs/care consideration  BiPAP/CPAP No  CPM No  Continuous Drip IV No  Dialysis No  Life Vest No  Oxygen No  Special Bed No  Trach Size No  Wound Vac (area) No  Skin Bruises easily  Bowel mgmt: Last BM 12/29/13  Bladder mgmt: Voiding in bathroom with assistance  Diabetic mgmt  No   Previous Home Environment  Living Arrangements: Spouse/significant other;Children  Available Help at Discharge: Family;Available 24 hours/day  Type of Home: House  Home Layout: One level  Home Access: Stairs to enter  Entrance Stairs-Rails: Right  Entrance Stairs-Number of Steps: 3  Bathroom Shower/Tub: Medical sales representative: Standard  Home Care Services: No  Additional Comments: works as a Mining engineer for Discharge Living Setting: Patient's home;House;Lives with (comment) (Lives with wife, daughter and grand daughter.)  Type of Home at Discharge: House  Discharge Home Layout: One level  Discharge Home Access: Stairs to enter  Entrance Stairs-Number of Steps: 4 to porch entry and 2 at garage entrance.  Does the patient have any problems obtaining your medications?: No   Social/Family/Support Systems  Patient Roles: Spouse;Parent (Has a wife, 2 sons and 1 Dtr.)  Contact Information: Jerel Sardina - wife  Anticipated Caregiver: Wife  Anticipated Caregiver's Contact Information: Tora Duck - wife (h) [phone]  Ability/Limitations of Caregiver: Wife can assist  Caregiver Availability: 24/7  Discharge Plan Discussed with Primary Caregiver: Yes  Is Caregiver In Agreement with Plan?: Yes  Does Caregiver/Family have Issues with Lodging/Transportation while Pt is in Rehab?: No  Goals/Additional Needs  Patient/Family Goal for Rehab: PT/OT mod I goals, no ST needs  Expected length of stay: 7 days  Cultural Considerations: Pentacostal  Dietary Needs: Regular, thin liquids  Equipment Needs: TBD  Pt/Family Agrees to Admission and willing to participate: Yes  Program Orientation Provided & Reviewed with Pt/Caregiver Including Roles & Responsibilities: Yes  Decrease burden of Care through IP rehab admission: N/A  Possible need for SNF placement upon discharge: Not anticipated  Patient Condition: This patient's condition remains as documented in the consult dated 12/29/13, in which the Rehabilitation Physician determined and documented that the patient's condition is appropriate for intensive rehabilitative care in an inpatient rehabilitation facility. Will admit to inpatient rehab today.  Preadmission Screen Completed By: Trish Mage, 12/29/2013 11:35 AM  ______________________________________________________________________  Discussed status with Dr. Riley Kill on 12/29/13 at 1143 and received telephone approval for admission today.  Admission Coordinator: Trish Mage, time1143/Date04/29/15  Cosigned by: Ranelle Oyster, MD [12/29/2013 12:09 PM]

## 2013-12-29 NOTE — H&P (Signed)
Physical Medicine and Rehabilitation Admission H&P    Chief Complaint  Patient presents with  . Code Stroke  : HPI: Justin Wells is a 63 y.o. right-handed male with history of hypertension, CAD status post stent x3, TIA, ischemic infarct in the setting of acute MI 2014. Independent prior to admission working full time  living with his wife. Patient admitted 12/27/2013 with right-sided weakness as well as dizziness and slurred speech. MRI of the brain with no acute findings. Cardiac enzymes negative. MRA of the head without stenosis or occlusion. Echocardiogram with ejection fraction of 16% grade 1 diastolic dysfunction. Patient did receive TPA. CTA of the head with posterior circulation mild branch vessel irregularity. CTA of the neck with plaque carotid bifurcation bilaterally with less than 50% diameter stenosis. Neurology services followup with suspect left brain posterior circulation small infarct not visualized on MRI. Maintained on aspirin and Plavix for CVA prophylaxis. Patient is tolerating a regular diet. Physical and occupational therapy evaluations completed with recommendations of physical medicine and rehabilitation consult. Admitted for comprehensive rehabilitation program   ROS Review of Systems  Musculoskeletal: Positive for myalgias.  Neurological: Positive for dizziness, speech change, weakness and headaches. Depression All other systems reviewed and are negative  Past Medical History  Diagnosis Date  . Peripheral arterial disease   . Hyperlipidemia   . Coronary artery disease   . Hypertension   . Barrett's esophagus   . Gastroesophageal reflux   . Cerebrovascular disease   . Myocardial infarction 2009    "during cardiac cath" (01/27/2013)  . H/O hiatal hernia   . XWRUEAVW(098.1)     "probably monthly" (01/27/2013)  . Seizures     "when I had my heart attack in 2009" (01/27/2013)  . Stroke 2009  2010    "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, weak right leg  and hand; I've had 2 or 3 strokes; can't remember the dates" (01/27/2013)  . Arthritis     "hands" (01/27/2013)   Past Surgical History  Procedure Laterality Date  . Laparoscopic gastric banding  2010  . Cardiac catheterization    . Coronary angioplasty with stent placement  11/02/2007    "2" (01/27/2013)  . Laparoscopic nissen fundoplication  ?2001  . Femoral artery stent Right 01/26/2013  . Diagnostic laparoscopy      "twice after nissen; it kept coming apart " (01/27/2013)   Family History  Problem Relation Age of Onset  . Heart disease Mother     Heart Disease before age 4  . Hypertension Mother   . Heart attack Mother   . Heart attack Father    Social History:  reports that he quit smoking about 12 years ago. His smoking use included Cigarettes. He has a 70 pack-year smoking history. He has never used smokeless tobacco. He reports that he does not drink alcohol or use illicit drugs. Allergies: No Known Allergies Medications Prior to Admission  Medication Sig Dispense Refill  . aspirin 325 MG EC tablet Take 325 mg by mouth daily.      . clopidogrel (PLAVIX) 75 MG tablet Take 1 tablet (75 mg total) by mouth daily.  90 tablet  3  . fish oil-omega-3 fatty acids 1000 MG capsule Take 1 g by mouth 2 (two) times daily.       Marland Kitchen lisinopril (PRINIVIL,ZESTRIL) 5 MG tablet Take 5 mg by mouth daily.      . Multiple Vitamins-Minerals (ONE-A-DAY MENS VITACRAVES PO) Take by mouth daily.      Marland Kitchen  oxyCODONE-acetaminophen (PERCOCET/ROXICET) 5-325 MG per tablet Take 1 tablet by mouth every 6 (six) hours as needed for pain.       Marland Kitchen PARoxetine (PAXIL) 20 MG tablet Take 20 mg by mouth every morning.      . ranitidine (ZANTAC) 150 MG tablet Take 150 mg by mouth 2 (two) times daily.      . vitamin B-12 (CYANOCOBALAMIN) 500 MCG tablet Take 500 mcg by mouth daily.      . nitroGLYCERIN (NITROSTAT) 0.4 MG SL tablet Place 0.4 mg under the tongue every 5 (five) minutes as needed for chest pain.         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 History: Prior Function Level of Independence: Independent  Functional Status:  Mobility: Bed Mobility Overal bed mobility: Modified Independent General bed mobility comments: increased time, HOB elevated, definite use of hands Transfers Overall transfer level: Needs assistance Equipment used: 1 person hand held assist Transfers: Sit to/from Stand Sit to Stand: Min assist General transfer comment: pt unsteady, guarded due to report "I feel like my R leg is going to give out" Ambulation/Gait Ambulation/Gait assistance: Min assist Ambulation Distance (Feet): 60 Feet Assistive device: Rolling walker (2 wheeled);1 person hand held assist Gait Pattern/deviations: Step-to pattern;Decreased stride length;Antalgic Gait velocity: slow and guarded General Gait Details: pt with report "I feel like my R leg is going to give out one me." Pt very cautious with 1 person HHA. pt with improved step length with RW however dependent on UEs. Attempted to have patient relax bilat UEs however pt with R sided weakness.    ADL: min assist with self-care/adl's    Cognition: Cognition Overall Cognitive Status: Within Functional Limits for tasks assessed Orientation Level: Oriented X4 Cognition Arousal/Alertness: Awake/alert Behavior During Therapy: WFL for tasks assessed/performed Overall Cognitive Status: Within Functional Limits for tasks assessed  Physical Exam: Blood pressure 127/79, pulse 65, temperature 97.6 F (36.4 C), temperature source Oral, resp. rate 16, height _0  (1.778  m), weight 89.6 kg (197 lb 8.5 oz), SpO2 98.00%. Physical Exam Constitutional: He is oriented to person, place, and time.  HENT: oral mucosa pink and moist Head: Normocephalic.  Eyes: EOM are normal.PERRL Neck: Normal range of motion. Neck supple. No thyromegaly present.  Cardiovascular: Normal rate and regular rhythm. No murmur or rub Respiratory: Effort normal and breath sounds normal. No respiratory distress. No wheezes or rales GI: Soft. Bowel sounds are normal. He exhibits no distension. No pain Neurological: He is alert and oriented to person, place, and time.  Follows full commands. Makes good eye contact with examiner. Mild right facial droop. No tongue deviation. No other CN abn. Speech clear. Good insight and awareness. Memory intact  3+ to 4 minus/5 right deltoid, bicep, tricep, grip 5/5 left deltoid, bicep, tricep, grip  3-/5 right hip flexor, 3+ to 4- knee extensor ankle dorsiflexor, APF 5/5 left hip flexor knee extensor ankle dorsiflex  1+/2  light touch in right upper and lower extremities. Normal sens left side.  Psych: pleasant and cooperative  Results for orders placed during the hospital encounter of 12/27/13 (from the past 48 hour(s))  ETHANOL     Status: None   Collection Time    12/27/13  12:35 PM      Result Value Ref Range   Alcohol, Ethyl (B) <11  0 - 11 mg/dL   Comment:            LOWEST DETECTABLE LIMIT FOR     SERUM ALCOHOL IS 11 mg/dL     FOR MEDICAL PURPOSES ONLY  PROTIME-INR     Status: None   Collection Time    12/27/13 12:35 PM      Result Value Ref Range   Prothrombin Time 12.7  11.6 - 15.2 seconds   INR 0.97  0.00 - 1.49  APTT     Status: None   Collection Time    12/27/13 12:35 PM      Result Value Ref Range   aPTT 24  24 - 37 seconds  CBC     Status: None   Collection Time    12/27/13 12:35 PM      Result Value Ref Range   WBC 8.2  4.0 - 10.5 K/uL   RBC 4.85  4.22 - 5.81 MIL/uL   Hemoglobin 14.7  13.0 - 17.0 g/dL   HCT 42.1  39.0 -  52.0 %   MCV 86.8  78.0 - 100.0 fL   MCH 30.3  26.0 - 34.0 pg   MCHC 34.9  30.0 - 36.0 g/dL   RDW 14.7  11.5 - 15.5 %   Platelets 178  150 - 400 K/uL  DIFFERENTIAL     Status: Abnormal   Collection Time    12/27/13 12:35 PM      Result Value Ref Range   Neutrophils Relative % 65  43 - 77 %   Neutro Abs 5.4  1.7 - 7.7 K/uL   Lymphocytes Relative 16  12 - 46 %   Lymphs Abs 1.3  0.7 - 4.0 K/uL   Monocytes Relative 11  3 - 12 %   Monocytes Absolute 0.9  0.1 - 1.0 K/uL   Eosinophils Relative 7 (*) 0 - 5 %   Eosinophils Absolute 0.6  0.0 - 0.7 K/uL   Basophils Relative 1  0 - 1 %   Basophils Absolute 0.1  0.0 - 0.1 K/uL  COMPREHENSIVE METABOLIC PANEL     Status: Abnormal   Collection Time    12/27/13 12:35 PM      Result Value Ref Range   Sodium 136 (*) 137 - 147 mEq/L   Potassium 4.1  3.7 - 5.3 mEq/L   Chloride 99  96 - 112 mEq/L   CO2 24  19 - 32 mEq/L   Glucose, Bld 76  70 - 99 mg/dL   BUN 12  6 - 23 mg/dL   Creatinine, Ser 0.92  0.50 - 1.35 mg/dL   Calcium 8.7  8.4 - 10.5 mg/dL   Total Protein 6.8  6.0 - 8.3 g/dL   Albumin 3.7  3.5 - 5.2 g/dL   AST 24  0 - 37 U/L   ALT 19  0 - 53 U/L   Alkaline Phosphatase 88  39 - 117 U/L   Total Bilirubin 0.4  0.3 - 1.2 mg/dL   GFR calc non Af Amer 90 (*) >90 mL/min   GFR calc Af Amer >90  >90 mL/min   Comment: (NOTE)     The eGFR has been calculated using the CKD EPI equation.     This calculation has not been validated in all clinical situations.     eGFR's persistently <90 mL/min signify possible Chronic Kidney  Disease.  Randolm Idol, ED     Status: None   Collection Time    12/27/13 12:41 PM      Result Value Ref Range   Troponin i, poc 0.00  0.00 - 0.08 ng/mL   Comment 3            Comment: Due to the release kinetics of cTnI,     a negative result within the first hours     of the onset of symptoms does not rule out     myocardial infarction with certainty.     If myocardial infarction is still suspected,      repeat the test at appropriate intervals.  I-STAT CHEM 8, ED     Status: Abnormal   Collection Time    12/27/13 12:42 PM      Result Value Ref Range   Sodium 138  137 - 147 mEq/L   Potassium 4.0  3.7 - 5.3 mEq/L   Chloride 101  96 - 112 mEq/L   BUN 11  6 - 23 mg/dL   Creatinine, Ser 1.00  0.50 - 1.35 mg/dL   Glucose, Bld 77  70 - 99 mg/dL   Calcium, Ion 1.10 (*) 1.13 - 1.30 mmol/L   TCO2 23  0 - 100 mmol/L   Hemoglobin 14.6  13.0 - 17.0 g/dL   HCT 43.0  39.0 - 52.0 %  CBG MONITORING, ED     Status: None   Collection Time    12/27/13  1:20 PM      Result Value Ref Range   Glucose-Capillary 72  70 - 99 mg/dL  URINE RAPID DRUG SCREEN (HOSP PERFORMED)     Status: Abnormal   Collection Time    12/27/13  2:47 PM      Result Value Ref Range   Opiates POSITIVE (*) NONE DETECTED   Cocaine NONE DETECTED  NONE DETECTED   Benzodiazepines NONE DETECTED  NONE DETECTED   Amphetamines NONE DETECTED  NONE DETECTED   Tetrahydrocannabinol NONE DETECTED  NONE DETECTED   Barbiturates NONE DETECTED  NONE DETECTED   Comment:            DRUG SCREEN FOR MEDICAL PURPOSES     ONLY.  IF CONFIRMATION IS NEEDED     FOR ANY PURPOSE, NOTIFY LAB     WITHIN 5 DAYS.                LOWEST DETECTABLE LIMITS     FOR URINE DRUG SCREEN     Drug Class       Cutoff (ng/mL)     Amphetamine      1000     Barbiturate      200     Benzodiazepine   929     Tricyclics       244     Opiates          300     Cocaine          300     THC              50  URINALYSIS, ROUTINE W REFLEX MICROSCOPIC     Status: None   Collection Time    12/27/13  2:47 PM      Result Value Ref Range   Color, Urine YELLOW  YELLOW   APPearance CLEAR  CLEAR   Specific Gravity, Urine 1.024  1.005 - 1.030   pH 6.0  5.0 - 8.0  Glucose, UA NEGATIVE  NEGATIVE mg/dL   Hgb urine dipstick NEGATIVE  NEGATIVE   Bilirubin Urine NEGATIVE  NEGATIVE   Ketones, ur NEGATIVE  NEGATIVE mg/dL   Protein, ur NEGATIVE  NEGATIVE mg/dL   Urobilinogen, UA  1.0  0.0 - 1.0 mg/dL   Nitrite NEGATIVE  NEGATIVE   Leukocytes, UA NEGATIVE  NEGATIVE   Comment: MICROSCOPIC NOT DONE ON URINES WITH NEGATIVE PROTEIN, BLOOD, LEUKOCYTES, NITRITE, OR GLUCOSE <1000 mg/dL.  MRSA PCR SCREENING     Status: None   Collection Time    12/27/13  6:32 PM      Result Value Ref Range   MRSA by PCR NEGATIVE  NEGATIVE   Comment:            The GeneXpert MRSA Assay (FDA     approved for NASAL specimens     only), is one component of a     comprehensive MRSA colonization     surveillance program. It is not     intended to diagnose MRSA     infection nor to guide or     monitor treatment for     MRSA infections.  HEMOGLOBIN A1C     Status: None   Collection Time    12/28/13  2:37 AM      Result Value Ref Range   Hemoglobin A1C 5.4  <5.7 %   Comment: (NOTE)                                                                               According to the ADA Clinical Practice Recommendations for 2011, when     HbA1c is used as a screening test:      >=6.5%   Diagnostic of Diabetes Mellitus               (if abnormal result is confirmed)     5.7-6.4%   Increased risk of developing Diabetes Mellitus     References:Diagnosis and Classification of Diabetes Mellitus,Diabetes     QBHA,1937,90(WIOXB 1):S62-S69 and Standards of Medical Care in             Diabetes - 2011,Diabetes Care,2011,34 (Suppl 1):S11-S61.   Mean Plasma Glucose 108  <117 mg/dL   Comment: Performed at [facility]     Status: None   Collection Time    12/28/13  2:38 AM      Result Value Ref Range   Cholesterol 151  0 - 200 mg/dL   Triglycerides 131  <150 mg/dL   HDL 40  >39 mg/dL   Total CHOL/HDL Ratio 3.8     VLDL 26  0 - 40 mg/dL   LDL Cholesterol 85  0 - 99 mg/dL   Comment:            Total Cholesterol/HDL:CHD Risk     Coronary Heart Disease Risk Table                         Men   Women      1/2 Average Risk   3.4   3.3      Average Risk  5.0   4.4      2 X  Average Risk   9.6   7.1      3 X Average Risk  23.4   11.0                Use the calculated Patient Ratio     above and the CHD Risk Table     to determine the patient's CHD Risk.                ATP III CLASSIFICATION (LDL):      <100     mg/dL   Optimal      100-129  mg/dL   Near or Above                        Optimal      130-159  mg/dL   Borderline      160-189  mg/dL   High      >190     mg/dL   Very High   Ct Angio Head W/cm &/or Wo Cm  12/28/2013   CLINICAL DATA:  Right-sided weakness has resolved. Persistent headaches. Hyperlipidemia. Hypertension. Seizures. Prior myocardial infarction.  EXAM: CT ANGIOGRAPHY HEAD AND NECK  TECHNIQUE: Multidetector CT imaging of the head and neck was performed using the standard protocol during bolus administration of intravenous contrast. Multiplanar CT image reconstructions and MIPs were obtained to evaluate the vascular anatomy. Carotid stenosis measurements (when applicable) are obtained utilizing NASCET criteria, using the distal internal carotid diameter as the denominator.  CONTRAST:  75m OMNIPAQUE IOHEXOL 350 MG/ML SOLN  COMPARISON:  12/27/2013 MR brain and MR angiogram circle [facility].  FINDINGS: CTA HEAD FINDINGS  No intracranial hemorrhage.  No CT evidence of large acute infarct.  No intracranial enhancing lesion.  Opacification/mucosal thickening paranasal sinuses most notable left maxillary sinus with there is an air-fluid level. Acute sinusitis not excluded. Almost complete opacification of majority of the ethmoid sinus air cells without evidence of intraorbital extension of sinus disease.  Calcified cavernous segment internal carotid artery bilaterally with mild narrowing.  Mild irregularity middle cerebral artery branch vessels.  Fetal type origin of the left posterior communicating artery.  Slightly ectatic vertebral arteries without high-grade stenosis.  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  Posterior circulation  mild branch vessel irregularity.  Review of the MIP images confirms the above findings.  CTA NECK FINDINGS  Aberrant origin of the right subclavian artery.  Right vertebral artery and right common carotid artery with common origin.  Left vertebral artery arises directly from the aortic arch with mild narrowing proximal aspect.  Plaque carotid bifurcation bilaterally with less than 50% diameter stenosis bilaterally.  No worrisome primary neck mass identified.  Scattered normal to top-normal size lymph nodes throughout the neck of questionable significance/etiology.  Mild cervical spondylotic changes with various degrees of spinal stenosis and foraminal narrowing.  Circumferential thickening of the cervical/ upper thoracic esophagus may be related to under distension although evaluation is limited.  Caries of residual teeth.  Review of the MIP images confirms the above findings.  IMPRESSION: CTA HEAD:  Opacification/mucosal thickening paranasal sinuses most notable left maxillary sinus with there is an air-fluid level. Acute sinusitis not excluded. Almost complete opacification of majority of the ethmoid sinus air cells without evidence of intraorbital extension of sinus disease.  Calcified cavernous segment internal carotid artery bilaterally with mild narrowing.  Mild irregularity middle cerebral artery branch vessels.  Fetal type origin  of the left posterior communicating artery.  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  Posterior circulation mild branch vessel irregularity.  CTA NECK :  Aberrant origin of the right subclavian artery.  Right vertebral artery and right common carotid artery with common origin.  Left vertebral artery arises directly from the aortic arch with mild narrowing proximal aspect.  Plaque carotid bifurcation bilaterally with less than 50% diameter stenosis bilaterally.  Scattered normal to top-normal size lymph nodes throughout the neck of questionable  significance/etiology.  Circumferential thickening of the cervical/ upper thoracic esophagus may be related to under distension although evaluation is limited.   Electronically Signed   By: Chauncey Cruel M.D.   On: 12/28/2013 13:09   Ct Head Wo Contrast  12/27/2013   CLINICAL DATA:  Right lower extremity weakness of acute onset  EXAM: CT HEAD WITHOUT CONTRAST  TECHNIQUE: Contiguous axial images were obtained from the base of the skull through the vertex without intravenous contrast. Study was obtained within 24 hr of patient's arrival at the emergency department.  COMPARISON:  September 08, 2007  FINDINGS: The ventricles are normal in size and configuration. There is no appreciable mass, hemorrhage, extra-axial fluid collection, or midline shift. There is subtle decreased attenuation in the lower right mid brain and upper right pons, concerning for recent infarct in this area. Elsewhere, gray-white compartments appear normal. There is mild stable increased attenuation in both middle cerebral arteries, a stable symmetric finding of doubtful significance.  Bony calvarium appears intact. The mastoid air cells are clear. There is mucosal thickening in both maxillary antra as well as extensive ethmoid sinus disease bilaterally. There is moderate mucosal thickening in the inferior frontal sinuses bilaterally as well as in the left and right sphenoid sinuses.  IMPRESSION: Question recent infarct in the lower right mid brain and upper right pons. Elsewhere gray-white compartments appear normal. No acute hemorrhage or mass. Multifocal paranasal sinus disease, most pronounced in the ethmoid complexes bilaterally.  Critical Value/emergent results were called by telephone at the time of interpretation on 12/27/2013 at 12:52 PM to Dr. Aram Beecham, neurology3, who verbally acknowledged these results.   Electronically Signed   By: Lowella Grip M.D.   On: 12/27/2013 12:56   Ct Angio Neck W/cm &/or Wo/cm  12/28/2013   CLINICAL  DATA:  Right-sided weakness has resolved. Persistent headaches. Hyperlipidemia. Hypertension. Seizures. Prior myocardial infarction.  EXAM: CT ANGIOGRAPHY HEAD AND NECK  TECHNIQUE: Multidetector CT imaging of the head and neck was performed using the standard protocol during bolus administration of intravenous contrast. Multiplanar CT image reconstructions and MIPs were obtained to evaluate the vascular anatomy. Carotid stenosis measurements (when applicable) are obtained utilizing NASCET criteria, using the distal internal carotid diameter as the denominator.  CONTRAST:  63m OMNIPAQUE IOHEXOL 350 MG/ML SOLN  COMPARISON:  12/27/2013 MR brain and MR angiogram circle [facility].  FINDINGS: CTA HEAD FINDINGS  No intracranial hemorrhage.  No CT evidence of large acute infarct.  No intracranial enhancing lesion.  Opacification/mucosal thickening paranasal sinuses most notable left maxillary sinus with there is an air-fluid level. Acute sinusitis not excluded. Almost complete opacification of majority of the ethmoid sinus air cells without evidence of intraorbital extension of sinus disease.  Calcified cavernous segment internal carotid artery bilaterally with mild narrowing.  Mild irregularity middle cerebral artery branch vessels.  Fetal type origin of the left posterior communicating artery.  Slightly ectatic vertebral arteries without high-grade stenosis.  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  Posterior circulation mild branch  vessel irregularity.  Review of the MIP images confirms the above findings.  CTA NECK FINDINGS  Aberrant origin of the right subclavian artery.  Right vertebral artery and right common carotid artery with common origin.  Left vertebral artery arises directly from the aortic arch with mild narrowing proximal aspect.  Plaque carotid bifurcation bilaterally with less than 50% diameter stenosis bilaterally.  No worrisome primary neck mass identified.  Scattered normal to  top-normal size lymph nodes throughout the neck of questionable significance/etiology.  Mild cervical spondylotic changes with various degrees of spinal stenosis and foraminal narrowing.  Circumferential thickening of the cervical/ upper thoracic esophagus may be related to under distension although evaluation is limited.  Caries of residual teeth.  Review of the MIP images confirms the above findings.  IMPRESSION: CTA HEAD:  Opacification/mucosal thickening paranasal sinuses most notable left maxillary sinus with there is an air-fluid level. Acute sinusitis not excluded. Almost complete opacification of majority of the ethmoid sinus air cells without evidence of intraorbital extension of sinus disease.  Calcified cavernous segment internal carotid artery bilaterally with mild narrowing.  Mild irregularity middle cerebral artery branch vessels.  Fetal type origin of the left posterior communicating artery.  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  Posterior circulation mild branch vessel irregularity.  CTA NECK :  Aberrant origin of the right subclavian artery.  Right vertebral artery and right common carotid artery with common origin.  Left vertebral artery arises directly from the aortic arch with mild narrowing proximal aspect.  Plaque carotid bifurcation bilaterally with less than 50% diameter stenosis bilaterally.  Scattered normal to top-normal size lymph nodes throughout the neck of questionable significance/etiology.  Circumferential thickening of the cervical/ upper thoracic esophagus may be related to under distension although evaluation is limited.   Electronically Signed   By: Chauncey Cruel M.D.   On: 12/28/2013 13:09   Ct Angio Chest W/cm &/or Wo Cm  12/27/2013   CLINICAL DATA:  Chest pain radiating to back question aortic dissection, history coronary artery disease post MI, hypertension, former smoker  EXAM: CT ANGIOGRAPHY CHEST, ABDOMEN AND PELVIS  TECHNIQUE: Multidetector  CT imaging through the chest, abdomen and pelvis was performed using the standard protocol during bolus administration of intravenous contrast. Multiplanar reconstructed images and MIPs were obtained and reviewed to evaluate the vascular anatomy. Precontrast images of the thorax were also obtained.  CONTRAST:  100 ml Omnipaque 350 IV.  COMPARISON:  None  FINDINGS: CTA CHEST FINDINGS  Atherosclerotic calcifications aorta and coronary arteries with question coronary stents.  No intramural hematoma identified on precontrast images.  Normal aortic enhancement following contrast without aortic aneurysm or dissection.  Pulmonary arteries appear grossly patent without evidence of pulmonary embolism on exam optimized for aortic imaging.  Minimally enlarged LEFT hilar lymph node 13 mm short axis image 73.  Small hiatal hernia.  Dependent atelectasis in both lungs.  No acute infiltrate, pleural effusion or pneumothorax.  Bone island T6 vertebral body.  Review of the MIP images confirms the above findings.  CTA ABDOMEN AND PELVIS FINDINGS  Scattered atherosclerotic calcifications without aneurysm or dissection.  Normal variant separate origin of common hepatic artery from aorta.  Mild narrowing of proximal SMA and origin of celiac artery.  Large gallstones dependently in gallbladder.  Liver, spleen, pancreas, kidneys, and adrenal glands normal appearance.  Normal appendix.  Scattered diverticulosis of the descending and sigmoid colon without diverticulitis.  Stomach and bowel loops otherwise unremarkable for technique.  No mass, adenopathy, free fluid or  inflammatory process.  BILATERAL inguinal hernias containing fat.  No acute osseous findings.  Review of the MIP images confirms the above findings.  IMPRESSION: Scattered atherosclerotic disease changes without evidence of aneurysm or dissection.  Small hiatal hernia.  Single minimally enlarged nonspecific LEFT hilar lymph node.  Cholelithiasis.  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  BILATERAL inguinal hernias containing fat.  Distal colonic diverticulosis without evidence of diverticulitis.   Electronically Signed   By: Lavonia Dana M.D.   On: 12/27/2013 13:34   Mr Brain Wo Contrast  12/27/2013   CLINICAL DATA:  Acute onset of right lower extremity weakness. Former smoker. Hyperlipidemia. Hypertension. Seizures.  EXAM: MRI HEAD WITHOUT CONTRAST  MRA HEAD WITHOUT CONTRAST  TECHNIQUE: Multiplanar, multiecho pulse sequences of the brain and surrounding structures were obtained without intravenous contrast. Angiographic images of the head were obtained using MRA technique without contrast.  COMPARISON:  12/27/2013 head CT.  01/26/2007 brain MR.  FINDINGS: MRI HEAD FINDINGS  No acute infarct.  No intracranial hemorrhage.  Very mild nonspecific white matter type changes probably related to result of small vessel disease.  No intracranial mass lesion noted on this unenhanced exam.  Mild to moderate paranasal sinus mucosal thickening with opacification of majority of ethmoid sinus air cells. No obvious intracranial or intraorbital extension.  Cervical medullary junction, pituitary region, pineal region and orbital structures unremarkable.  Major intracranial vascular structures are patent.  MRA HEAD FINDINGS  Anterior circulation without medium or large size vessel significant stenosis or occlusion. Mild branch vessel irregularity most notable involving middle cerebral artery branches.  Fetal type contribution to the left posterior cerebral artery.  Ectatic vertebral arteries and basilar artery without high-grade stenosis.  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  Nonvisualized left anterior inferior cerebral artery.  Mild irregularity and narrowing involving portions of the superior cerebellar artery, posterior cerebral artery distal branches, right anterior inferior cerebellar artery and posterior inferior cerebellar arteries.  IMPRESSION: MRI  HEAD:  No acute infarct.  Very mild nonspecific white matter type changes probably related to result of small vessel disease.  Mild to moderate paranasal sinus mucosal thickening with opacification of majority of ethmoid sinus air cells.  MRA HEAD:  Mild branch vessel irregularity as noted above.   Electronically Signed   By: Chauncey Cruel M.D.   On: 12/27/2013 17:56   Dg Chest Port 1 View  12/27/2013   CLINICAL DATA:  Stroke  EXAM: PORTABLE CHEST - 1 VIEW  COMPARISON:  CT ANGIO CHEST W/CM &/OR WO/CM dated 12/27/2013  FINDINGS: The heart size and mediastinal contours are within normal limits. Both lungs are clear. The visualized skeletal structures are unremarkable.  IMPRESSION: No active disease.   Electronically Signed   By: Kathreen Devoid   On: 12/27/2013 13:56   Mr Jodene Nam Head/brain Wo Cm  12/27/2013   CLINICAL DATA:  Acute onset of right lower extremity weakness. Former smoker. Hyperlipidemia. Hypertension. Seizures.  EXAM: MRI HEAD WITHOUT CONTRAST  MRA HEAD WITHOUT CONTRAST  TECHNIQUE: Multiplanar, multiecho pulse sequences of the brain and surrounding structures were obtained without intravenous contrast. Angiographic images of the head were obtained using MRA technique without contrast.  COMPARISON:  12/27/2013 head CT.  01/26/2007 brain MR.  FINDINGS: MRI HEAD FINDINGS  No acute infarct.  No intracranial hemorrhage.  Very mild nonspecific white matter type changes probably related to result of small vessel disease.  No intracranial mass lesion noted on this unenhanced exam.  Mild to moderate  paranasal sinus mucosal thickening with opacification of majority of ethmoid sinus air cells. No obvious intracranial or intraorbital extension.  Cervical medullary junction, pituitary region, pineal region and orbital structures unremarkable.  Major intracranial vascular structures are patent.  MRA HEAD FINDINGS  Anterior circulation without medium or large size vessel significant stenosis or occlusion. Mild branch  vessel irregularity most notable involving middle cerebral artery branches.  Fetal type contribution to the left posterior cerebral artery.  Ectatic vertebral arteries and basilar artery without high-grade stenosis.  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  Nonvisualized left anterior inferior cerebral artery.  Mild irregularity and narrowing involving portions of the superior cerebellar artery, posterior cerebral artery distal branches, right anterior inferior cerebellar artery and posterior inferior cerebellar arteries.  IMPRESSION: MRI HEAD:  No acute infarct.  Very mild nonspecific white matter type changes probably related to result of small vessel disease.  Mild to moderate paranasal sinus mucosal thickening with opacification of majority of ethmoid sinus air cells.  MRA HEAD:  Mild branch vessel irregularity as noted above.   Electronically Signed   By: Chauncey Cruel M.D.   On: 12/27/2013 17:56   Ct Cta Abd/pel W/cm &/or W/o Cm  12/27/2013   CLINICAL DATA:  Chest pain radiating to back question aortic dissection, history coronary artery disease post MI, hypertension, former smoker  EXAM: CT ANGIOGRAPHY CHEST, ABDOMEN AND PELVIS  TECHNIQUE: Multidetector CT imaging through the chest, abdomen and pelvis was performed using the standard protocol during bolus administration of intravenous contrast. Multiplanar reconstructed images and MIPs were obtained and reviewed to evaluate the vascular anatomy. Precontrast images of the thorax were also obtained.  CONTRAST:  100 ml Omnipaque 350 IV.  COMPARISON:  None  FINDINGS: CTA CHEST FINDINGS  Atherosclerotic calcifications aorta and coronary arteries with question coronary stents.  No intramural hematoma identified on precontrast images.  Normal aortic enhancement following contrast without aortic aneurysm or dissection.  Pulmonary arteries appear grossly patent without evidence of pulmonary embolism on exam optimized for aortic imaging.   Minimally enlarged LEFT hilar lymph node 13 mm short axis image 73.  Small hiatal hernia.  Dependent atelectasis in both lungs.  No acute infiltrate, pleural effusion or pneumothorax.  Bone island T6 vertebral body.  Review of the MIP images confirms the above findings.  CTA ABDOMEN AND PELVIS FINDINGS  Scattered atherosclerotic calcifications without aneurysm or dissection.  Normal variant separate origin of common hepatic artery from aorta.  Mild narrowing of proximal SMA and origin of celiac artery.  Large gallstones dependently in gallbladder.  Liver, spleen, pancreas, kidneys, and adrenal glands normal appearance.  Normal appendix.  Scattered diverticulosis of the descending and sigmoid colon without diverticulitis.  Stomach and bowel loops otherwise unremarkable for technique.  No mass, adenopathy, free fluid or inflammatory process.  BILATERAL inguinal hernias containing fat.  No acute osseous findings.  Review of the MIP images confirms the above findings.  IMPRESSION: Scattered atherosclerotic disease changes without evidence of aneurysm or dissection.  Small hiatal hernia.  Single minimally enlarged nonspecific LEFT hilar lymph node.  Cholelithiasis.  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  BILATERAL inguinal hernias containing fat.  Distal colonic diverticulosis without evidence of diverticulitis.   Electronically Signed   By: Lavonia Dana M.D.   On: 12/27/2013 13:34    Post Admission Physician Evaluation: 1. Functional deficits secondary  to left subcortical infarct not seen on MRI. 2. Patient is admitted to receive collaborative, interdisciplinary care between the physiatrist, rehab  nursing staff, and therapy team. 3. Patient's level of medical complexity and substantial therapy needs in context of that medical necessity cannot be provided at a lesser intensity of care such as a SNF. 4. Patient has experienced substantial functional loss from his/her baseline which was documented  above under the "Functional History" and "Functional Status" headings.  Judging by the patient's diagnosis, physical exam, and functional history, the patient has potential for functional progress which will result in measurable gains while on inpatient rehab.  These gains will be of substantial and practical use upon discharge  in facilitating mobility and self-care at the household level. 5. Physiatrist will provide 24 hour management of medical needs as well as oversight of the therapy plan/treatment and provide guidance as appropriate regarding the interaction of the two. 6. 24 hour rehab nursing will assist with bladder management, bowel management, safety, skin/wound care, disease management, medication administration and patient education  and help integrate therapy concepts, techniques,education, etc. 7. PT will assess and treat for/with: Lower extremity strength, range of motion, stamina, balance, functional mobility, safety, adaptive techniques and equipment, NMR, visual-perceptual awareness, education, egosupport.   Goals are: mod I. 8. OT will assess and treat for/with: ADL's, functional mobility, safety, upper extremity strength, adaptive techniques and equipment, NMR, visual-perceptual awareness, education, ego support.   Goals are: mod I. 9. SLP will assess and treat for/with: n/a.  Goals are: n/a. 10. Case Management and Social Worker will assess and treat for psychological issues and discharge planning. 11. Team conference will be held weekly to assess progress toward goals and to determine barriers to discharge. 12. Patient will receive at least 3 hours of therapy per day at least 5 days per week. 13. ELOS: 9-12 days       14. Prognosis:  excellent   Medical Problem List and Plan: 1. 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 DVT Prophylaxis/Anticoagulation: SCDs. Monitor for any signs of DVT 3. Pain Management: Oxycodone as needed. 4. Mood/depression. Paxil  20 mg daily. Provide emotional support 5. Neuropsych: This patient is capable of making decisions on his own behalf. 6. Hypertension. Lisinopril 5 mg daily. Monitor with increased mobility 7. CAD status post stenting x3. Continue aspirin Plavix therapy. No chest pain and shortness of breath. Cardiac enzymes negative. 8. Hyperlipidemia. [place] 9. Resolving bronchitis: robitussin prn, IS  Meredith Staggers, MD, [facility]  12/29/2013

## 2013-12-30 ENCOUNTER — Inpatient Hospital Stay (HOSPITAL_COMMUNITY): Payer: BC Managed Care – PPO

## 2013-12-30 ENCOUNTER — Inpatient Hospital Stay (HOSPITAL_COMMUNITY): Payer: BC Managed Care – PPO | Admitting: Occupational Therapy

## 2013-12-30 DIAGNOSIS — I6330 Cerebral infarction due to thrombosis of unspecified cerebral artery: Secondary | ICD-10-CM

## 2013-12-30 LAB — COMPREHENSIVE METABOLIC PANEL
ALK PHOS: 87 U/L (ref 39–117)
ALT: 19 U/L (ref 0–53)
AST: 27 U/L (ref 0–37)
Albumin: 3.8 g/dL (ref 3.5–5.2)
BUN: 12 mg/dL (ref 6–23)
CALCIUM: 9.1 mg/dL (ref 8.4–10.5)
CO2: 22 mEq/L (ref 19–32)
Chloride: 96 mEq/L (ref 96–112)
Creatinine, Ser: 0.96 mg/dL (ref 0.50–1.35)
GFR calc Af Amer: >90 mL/min (ref >90)
GFR calc non Af Amer: 88 mL/min — ABNORMAL LOW (ref >90)
Glucose, Bld: 267 mg/dL — ABNORMAL HIGH (ref 70–99)
POTASSIUM: 4.7 meq/L (ref 3.7–5.3)
SODIUM: 136 meq/L — AB (ref 137–147)
TOTAL PROTEIN: 7.2 g/dL (ref 6.0–8.3)
Total Bilirubin: 0.6 mg/dL (ref 0.3–1.2)

## 2013-12-30 LAB — CBC WITH DIFFERENTIAL/PLATELET
BASOS ABS: 0.1 10*3/uL (ref 0.0–0.1)
Basophils Relative: 1 % (ref 0–1)
EOS ABS: 0.5 10*3/uL (ref 0.0–0.7)
EOS PCT: 9 % — AB (ref 0–5)
HCT: 44.4 % (ref 39.0–52.0)
Hemoglobin: 15.1 g/dL (ref 13.0–17.0)
Lymphocytes Relative: 19 % (ref 12–46)
Lymphs Abs: 1.1 10*3/uL (ref 0.7–4.0)
MCH: 30.1 pg (ref 26.0–34.0)
MCHC: 34 g/dL (ref 30.0–36.0)
MCV: 88.6 fL (ref 78.0–100.0)
Monocytes Absolute: 0.4 10*3/uL (ref 0.1–1.0)
Monocytes Relative: 7 % (ref 3–12)
NEUTROS PCT: 64 % (ref 43–77)
Neutro Abs: 3.8 10*3/uL (ref 1.7–7.7)
Platelets: 184 10*3/uL (ref 150–400)
RBC: 5.01 MIL/uL (ref 4.22–5.81)
RDW: 15 % (ref 11.5–15.5)
WBC: 5.9 10*3/uL (ref 4.0–10.5)

## 2013-12-30 MED ORDER — TRAZODONE HCL 50 MG PO TABS
25.0000 mg | ORAL_TABLET | Freq: Every evening | ORAL | Status: DC | PRN
  Administered 2013-12-30 – 2013-12-31 (×2): 50 mg via ORAL
  Filled 2013-12-30 (×2): qty 1

## 2013-12-30 NOTE — Progress Notes (Signed)
Social Work Assessment and Plan Social Work Assessment and Plan  Patient Details  Name: Justin Wells MRN: [id number] Date of Birth: December 02, 1950  Today's Date: 12/30/2013  Problem List:  Patient Active Problem List   Diagnosis Date Noted  . CVA (cerebral infarction) 12/29/2013  . Hypertension 12/28/2013  . Other and unspecified hyperlipidemia 12/28/2013  . Brainstem infarct not seen on MRI 12/27/2013  . Numbness and tingling 01/12/2013  . Peripheral vascular disease, unspecified 01/12/2013  . Pain in limb 01/12/2013  . Atherosclerosis of native arteries of the extremities with intermittent claudication 01/12/2013   Past Medical History:  Past Medical History  Diagnosis Date  . Peripheral arterial disease   . Hyperlipidemia   . Coronary artery disease   . Hypertension   . Barrett's esophagus   . Gastroesophageal reflux   . Cerebrovascular disease   . Myocardial infarction 2009    "during cardiac cath" (01/27/2013)  . H/O hiatal hernia   . [id number](478.2Headache(784.0)     "probably monthly" (01/27/2013)  . Seizures     "when I had my heart attack in 2009" (01/27/2013)  . 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 Arthritis     "hands" (01/27/2013)   Past Surgical History:  Past Surgical History  Procedure Laterality Date  . Laparoscopic gastric banding  2010  . Cardiac catheterization    . Coronary angioplasty with stent placement  11/02/2007    "2" (01/27/2013)  . Laparoscopic nissen fundoplication  ?2001  . Femoral artery stent Right 01/26/2013  . Diagnostic laparoscopy      "twice after nissen; it kept coming apart " (01/27/2013)   Social History:  reports that he quit smoking about 12 years ago. His smoking use included Cigarettes. He has a 70 pack-year smoking history. He has never used smokeless tobacco. He reports that he does not drink alcohol or use illicit  drugs.  Family / Support Systems Marital Status: Married Patient Roles: Spouse;Parent;Other (Comment) (Employee) Spouse/Significant Other: [name]- [phone]-home  [phone]-cell Children: Angela-daughter  [phone] Other Supports: friends and another child Anticipated Caregiver: Self and wife is needed Ability/Limitations of Caregiver: Wife is a breast cancer survivor and receiving disability, can be there with pt Caregiver Availability: 24/7 Family Dynamics: Close knit with family daughter and [phone] month old granddaugther live with them.  He is blessed to have the fmaily he does and wants to get home soon.  Social History Preferred language: English Religion: Holiness/Pentecostal Cultural Background: No issues Education: Trade School Read: Yes Write: Yes Employment Status: Employed Name of Employer: Welder-fulltime Return to Work Plans: Unsure would [employer] too, will wait and see Legal Hisotry/Current Legal Issues: No issues Guardian/Conservator: None-according to MD pt is capable of making his own decisions while here.   Abuse/Neglect Physical Abuse: Denies Verbal Abuse: Denies Sexual Abuse: Denies Exploitation of patient/patient's resources: Denies Self-Neglect: Denies Possible abuse reported to:: Other (Comment) (na)  Emotional Status Pt's affect, behavior adn adjustment status: Pt is motivated to improve and regain his independence.  He has made a miraclous recovery and feels he is back to baseline now.  He will do whatever is asked of him to recover from this and he is so appreicative of everyone here. Recent Psychosocial Issues: Other medical issues-has had strokes in the past and recovered from them. Pyschiatric History: History of anxiety takes meds for and finds this helpful.  Deferred dperession screen due to doing well and very short length of stay Substance Abuse History: No issues  Patient / Family Perceptions, Expectations & Goals Pt/Family understanding of illness  & functional limitations: Pt and wife are able to explain his stroke and recovery from this.  He feels he is back to baseline and is ready to be discharged from the hospital.  Addressing with MD and team.  He is recovering back to independent level. Premorbid pt/family roles/activities: Husband, father, grandfather, Employee, Mining engineerChurch Member, etc Anticipated changes in roles/activities/participation: resume Pt/family expectations/goals: Pt states; ' I am ready to go home, you all have been wonderful, but I am back."   Wife states; " He has done very well."  [employer] Resources Community Agencies: None Premorbid Home Care/DME Agencies: None Transportation available at discharge: [employer] Resource referrals recommended: Support group (specify) ([facility] Support group)  Discharge Planning Living Arrangements: Spouse/significant other;Children Support Systems: Spouse/significant other;Children;Other relatives;Friends/neighbors;Church/faith community Type of Residence: Private residence Insurance Resources: AdministratorMedicare;Private Insurance (specify) Herbalist(BCBS) Financial Resources: Employment;Family Support Financial Screen Referred: No Living Expenses: Lives with family Money Management: Spouse;Patient Does the patient have any problems obtaining your medications?: No Home Management: Wife and daughter Patient/Family Preliminary Plans: Return home wiht wife and daughter, along with granddaughter.  Wife is disabled and able to be there with him.  Pt is at independent levle and will not need assistance. Social Work Anticipated Follow Up Needs: HH/OP;Support Group  Clinical Impression Very pleaseant gentleman who has recovered from his stroke and is ready for discharge Sat.  Discussing OP therapy options and will set up OPPT. Awaiting MD response regarding discharge on Sat.  Work on safe discharge plan.  Lemar LivingsRebecca G Lamika Connolly 12/30/2013, 1:28 PM

## 2013-12-30 NOTE — Progress Notes (Signed)
Physical Medicine and Rehabilitation Consult  Reason for Consult: CVA  Referring Physician: Dr. [name]  HPI: Justin Wells is a 63 y.o. right-handed male with history of hypertension, CAD status post stent x3, TIA, ischemic infarct in the setting of acute MI 2014. Independent prior to admission and driving living with his wife. Patient admitted 12/27/2013 with right-sided weakness as well as dizziness and slurred speech. MRI of the brain with no acute findings. Cardiac enzymes negative. MRA of the head without stenosis or occlusion. Echocardiogram with ejection fraction of 65% grade 1 diastolic dysfunction. Patient did receive TPA. CTA of the head with posterior circulation mild branch vessel irregularity. CTA of the neck with plaque carotid bifurcation bilaterally with less than 50% diameter stenosis. Neurology services followup with suspect left brain posterior circulation small infarct not visualized on MRI. Maintained on aspirin and Plavix for CVA prophylaxis. Patient is tolerating a regular diet. Physical and occupational therapy evaluations completed with recommendations of physical medicine and rehabilitation consult.  No issues with weakness PTA per pt today, working full time as Psychologist, occupational  Review of Systems  Musculoskeletal: Positive for myalgias.  Neurological: Positive for dizziness, speech change, weakness and headaches.  All other systems reviewed and are negative.   Past Medical History   Diagnosis  Date   .  Peripheral arterial disease    .  Hyperlipidemia    .  Coronary artery disease    .  Hypertension    .  Barrett's esophagus    .  Gastroesophageal reflux    .  Cerebrovascular disease    .  Myocardial infarction  2009     "during cardiac cath" (01/27/2013)   .  H/O hiatal hernia    .  ZOXWRUEA(540.9)      "probably monthly" (01/27/2013)   .  Seizures      "when I had my heart attack in 2009" (01/27/2013)   .  Stroke  2009 2010     "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,  weak right leg and hand; I've had 2 or 3 strokes; can't remember the dates" (01/27/2013)   .  Arthritis      "hands" (01/27/2013)    Past Surgical History   Procedure  Laterality  Date   .  Laparoscopic gastric banding   2010   .  Cardiac catheterization     .  Coronary angioplasty with stent placement   11/02/2007     "2" (01/27/2013)   .  Laparoscopic nissen fundoplication   ?2001   .  Femoral artery stent  Right  01/26/2013   .  Diagnostic laparoscopy       "twice after nissen; it kept coming apart " (01/27/2013)    Family History   Problem  Relation  Age of Onset   .  Heart disease  Mother      Heart Disease before age 69   .  Hypertension  Mother    .  Heart attack  Mother    .  Heart attack  Father     Social History: reports that he quit smoking about 12 years ago. His smoking use included Cigarettes. He has a 70 pack-year smoking history. He has never used smokeless tobacco. He reports that he does not drink alcohol or use illicit drugs.  Allergies: No Known Allergies  Medications Prior to Admission   Medication  Sig  Dispense  Refill   .  aspirin 325 MG EC tablet  Take 325 mg by  mouth daily.     .  clopidogrel (PLAVIX) 75 MG tablet  Take 1 tablet (75 mg total) by mouth daily.  90 tablet  3   .  fish oil-omega-3 fatty acids 1000 MG capsule  Take 1 g by mouth 2 (two) times daily.     Marland Kitchen  lisinopril (PRINIVIL,ZESTRIL) 5 MG tablet  Take 5 mg by mouth daily.     .  Multiple Vitamins-Minerals (ONE-A-DAY MENS VITACRAVES PO)  Take by mouth daily.     Marland Kitchen  oxyCODONE-acetaminophen (PERCOCET/ROXICET) 5-325 MG per tablet  Take 1 tablet by mouth every 6 (six) hours as needed for pain.     Marland Kitchen  PARoxetine (PAXIL) 20 MG tablet  Take 20 mg by mouth every morning.     .  ranitidine (ZANTAC) 150 MG tablet  Take 150 mg by mouth 2 (two) times daily.     .  vitamin B-12 (CYANOCOBALAMIN) 500 MCG tablet  Take 500 mcg by mouth daily.     .  nitroGLYCERIN (NITROSTAT) 0.4 MG SL tablet  Place 0.4 mg under the  tongue every 5 (five) minutes as needed for chest pain.      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 History:  Prior Function  Level of Independence: Independent  Functional Status:  Mobility:  Bed Mobility  Overal bed mobility: Modified Independent  General bed mobility comments: increased time, HOB elevated, definite use of hands  Transfers  Overall transfer level: Needs assistance  Equipment used: 1 person hand held assist  Transfers: Sit to/from Stand  Sit to Stand: Min assist  General transfer comment: pt unsteady, guarded due to report "I feel like my R leg is going to give out"  Ambulation/Gait  Ambulation/Gait assistance: Min assist  Ambulation Distance (Feet): 60 Feet  Assistive device: Rolling walker (2 wheeled);1 person hand held assist  Gait Pattern/deviations: Step-to pattern;Decreased stride length;Antalgic  Gait velocity: slow and guarded  General Gait Details: pt with report "I feel like my R leg is going to give out one me." Pt very cautious with 1 person HHA. pt with improved step length with RW however dependent on UEs. Attempted to have patient relax bilat UEs however pt with R sided weakness.   ADL:  ADL  Overall ADL's : Needs assistance/impaired  Eating/Feeding: Independent;Sitting  Grooming: Set up;Sitting  Upper Body Bathing: Set up;Sitting  Lower Body Bathing: Minimal assistance;Sit to/from stand  Upper Body Dressing : Set up;Sitting  Lower Body Dressing: Minimal assistance;Sit to/from stand  Toilet Transfer: Minimal assistance;Ambulation  Toileting- Clothing Manipulation and  Hygiene: Minimal assistance  Functional mobility during ADLs: Minimal assistance  Cognition:  Cognition  Overall Cognitive Status: Within Functional Limits for tasks assessed  Orientation Level: Oriented X4  Cognition  Arousal/Alertness: Awake/alert  Behavior During Therapy: WFL for tasks assessed/performed  Overall Cognitive Status: Within Functional Limits for tasks assessed  Blood pressure 121/73, pulse 63, temperature 98.5 F (36.9 C), temperature source Oral, resp. rate 15, height 5\' 10"  (1.778 m), weight 89.6 kg (197 lb 8.5 oz), SpO2 97.00%.  Physical Exam  Vitals reviewed.  Constitutional: He is oriented to person, place, and time.  HENT:  Head: Normocephalic.  Eyes: EOM are normal.  Neck: Normal range of motion. Neck supple. No thyromegaly present.  Cardiovascular: Normal rate and regular rhythm.  Respiratory: Effort normal and breath sounds normal. No respiratory distress.  GI: Soft. Bowel sounds are normal. He exhibits no distension.  Neurological: He is alert and oriented to person, place, and time.  Follows full commands. Makes good eye contact with examiner.  Skin: Skin is warm and dry.  4 minus/5 right deltoid, bicep, tricep, grip 5/5 left deltoid, bicep, tricep, grip  4/5 right hip flexor knee extensor ankle dorsiflexor  5/5 left hip flexor knee extensor ankle dorsiflex  Sensory normal light touch in bilateral upper and lower extremities  No results found for this or any previous visit (from the past 24 hour(s)).  Ct Angio Head W/cm &/or Wo Cm  12/28/2013 CLINICAL DATA: Right-sided weakness has resolved. Persistent headaches. Hyperlipidemia. Hypertension. Seizures. Prior myocardial infarction. EXAM: CT ANGIOGRAPHY HEAD AND NECK TECHNIQUE: Multidetector CT imaging of the head and neck was performed using the standard protocol during bolus administration of intravenous contrast. Multiplanar CT image reconstructions and MIPs were obtained to evaluate the vascular anatomy.  Carotid stenosis measurements (when applicable) are obtained utilizing NASCET criteria, using the distal internal carotid diameter as the denominator. CONTRAST: 50mL OMNIPAQUE IOHEXOL 350 MG/ML SOLN COMPARISON: 12/27/2013 MR brain and MR angiogram circle Willis. FINDINGS: CTA HEAD FINDINGS No intracranial hemorrhage. No CT evidence of large acute infarct. No intracranial enhancing lesion. Opacification/mucosal thickening paranasal sinuses most notable left maxillary sinus with there is an air-fluid level. Acute sinusitis not excluded. Almost complete opacification of majority of the ethmoid sinus air cells without evidence of intraorbital extension of sinus disease. Calcified cavernous segment internal carotid artery bilaterally with mild narrowing. Mild irregularity middle cerebral artery branch vessels. Fetal type origin of the left posterior communicating artery. Slightly ectatic vertebral arteries without high-grade stenosis. 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 Posterior circulation mild branch vessel irregularity. Review of the MIP images confirms the above findings. CTA NECK FINDINGS Aberrant origin of the right subclavian artery. Right vertebral artery and right common carotid artery with common origin. Left vertebral artery arises directly from the aortic arch with mild narrowing proximal aspect. Plaque carotid bifurcation bilaterally with less than 50% diameter stenosis bilaterally. No worrisome primary neck mass identified. Scattered normal to top-normal size lymph nodes throughout the neck of questionable significance/etiology. Mild cervical spondylotic changes with various degrees of spinal stenosis and foraminal narrowing. Circumferential thickening of the cervical/ upper thoracic esophagus may be related to under distension although evaluation is limited. Caries of residual teeth. Review of the MIP images confirms the above findings. IMPRESSION: CTA HEAD:  Opacification/mucosal thickening paranasal sinuses most notable left maxillary sinus with there is an air-fluid level. Acute sinusitis not excluded. Almost complete opacification of majority of the ethmoid sinus air cells without evidence of intraorbital extension of sinus disease. Calcified cavernous segment internal carotid artery bilaterally with mild narrowing. Mild irregularity middle cerebral artery branch vessels. Fetal type origin of the left posterior communicating artery. 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 Posterior circulation mild branch vessel irregularity. CTA NECK : Aberrant origin of the right subclavian artery. Right vertebral artery and right common carotid artery with common origin. Left vertebral artery arises directly from the aortic arch with mild narrowing proximal aspect. Plaque carotid bifurcation bilaterally with less than 50% diameter stenosis bilaterally. Scattered normal to top-normal size lymph nodes throughout the neck of questionable significance/etiology. Circumferential thickening of the cervical/ upper  thoracic esophagus may be related to under distension although evaluation is limited. Electronically Signed By: Bridgett LarssonSteve Olson M.D. On: 12/28/2013 13:09  Ct Head Wo Contrast  12/27/2013 CLINICAL DATA: Right lower extremity weakness of acute onset EXAM: CT HEAD WITHOUT CONTRAST TECHNIQUE: Contiguous axial images were obtained from the base of the skull through the vertex without intravenous contrast. Study was obtained within 24 hr of patient's arrival at the emergency department. COMPARISON: September 08, 2007 FINDINGS: The ventricles are normal in size and configuration. There is no appreciable mass, hemorrhage, extra-axial fluid collection, or midline shift. There is subtle decreased attenuation in the lower right mid brain and upper right pons, concerning for recent infarct in this area. Elsewhere, gray-white compartments appear normal. There is mild  stable increased attenuation in both middle cerebral arteries, a stable symmetric finding of doubtful significance. Bony calvarium appears intact. The mastoid air cells are clear. There is mucosal thickening in both maxillary antra as well as extensive ethmoid sinus disease bilaterally. There is moderate mucosal thickening in the inferior frontal sinuses bilaterally as well as in the left and right sphenoid sinuses. IMPRESSION: Question recent infarct in the lower right mid brain and upper right pons. Elsewhere gray-white compartments appear normal. No acute hemorrhage or mass. Multifocal paranasal sinus disease, most pronounced in the ethmoid complexes bilaterally. Critical Value/emergent results were called by telephone at the time of interpretation on 12/27/2013 at 12:52 PM to Dr. Cyril Mourningamillo, neurology3, who verbally acknowledged these results. Electronically Signed By: Bretta BangWilliam Woodruff M.D. On: 12/27/2013 12:56  Ct Angio Neck W/cm &/or Wo/cm  12/28/2013 CLINICAL DATA: Right-sided weakness has resolved. Persistent headaches. Hyperlipidemia. Hypertension. Seizures. Prior myocardial infarction. EXAM: CT ANGIOGRAPHY HEAD AND NECK TECHNIQUE: Multidetector CT imaging of the head and neck was performed using the standard protocol during bolus administration of intravenous contrast. Multiplanar CT image reconstructions and MIPs were obtained to evaluate the vascular anatomy. Carotid stenosis measurements (when applicable) are obtained utilizing NASCET criteria, using the distal internal carotid diameter as the denominator. CONTRAST: 50mL OMNIPAQUE IOHEXOL 350 MG/ML SOLN COMPARISON: 12/27/2013 MR brain and MR angiogram circle Willis. FINDINGS: CTA HEAD FINDINGS No intracranial hemorrhage. No CT evidence of large acute infarct. No intracranial enhancing lesion. Opacification/mucosal thickening paranasal sinuses most notable left maxillary sinus with there is an air-fluid level. Acute sinusitis not excluded. Almost complete  opacification of majority of the ethmoid sinus air cells without evidence of intraorbital extension of sinus disease. Calcified cavernous segment internal carotid artery bilaterally with mild narrowing. Mild irregularity middle cerebral artery branch vessels. Fetal type origin of the left posterior communicating artery. Slightly ectatic vertebral arteries without high-grade stenosis. 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 Posterior circulation mild branch vessel irregularity. Review of the MIP images confirms the above findings. CTA NECK FINDINGS Aberrant origin of the right subclavian artery. Right vertebral artery and right common carotid artery with common origin. Left vertebral artery arises directly from the aortic arch with mild narrowing proximal aspect. Plaque carotid bifurcation bilaterally with less than 50% diameter stenosis bilaterally. No worrisome primary neck mass identified. Scattered normal to top-normal size lymph nodes throughout the neck of questionable significance/etiology. Mild cervical spondylotic changes with various degrees of spinal stenosis and foraminal narrowing. Circumferential thickening of the cervical/ upper thoracic esophagus may be related to under distension although evaluation is limited. Caries of residual teeth. Review of the MIP images confirms the above findings. IMPRESSION: CTA HEAD: Opacification/mucosal thickening paranasal sinuses most notable left maxillary sinus with there is an air-fluid level. Acute sinusitis  not excluded. Almost complete opacification of majority of the ethmoid sinus air cells without evidence of intraorbital extension of sinus disease. Calcified cavernous segment internal carotid artery bilaterally with mild narrowing. Mild irregularity middle cerebral artery branch vessels. Fetal type origin of the left posterior communicating artery. 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 Posterior circulation  mild branch vessel irregularity. CTA NECK : Aberrant origin of the right subclavian artery. Right vertebral artery and right common carotid artery with common origin. Left vertebral artery arises directly from the aortic arch with mild narrowing proximal aspect. Plaque carotid bifurcation bilaterally with less than 50% diameter stenosis bilaterally. Scattered normal to top-normal size lymph nodes throughout the neck of questionable significance/etiology. Circumferential thickening of the cervical/ upper thoracic esophagus may be related to under distension although evaluation is limited. Electronically Signed By: Bridgett LarssonSteve Olson M.D. On: 12/28/2013 13:09  Ct Angio Chest W/cm &/or Wo Cm  12/27/2013 CLINICAL DATA: Chest pain radiating to back question aortic dissection, history coronary artery disease post MI, hypertension, former smoker EXAM: CT ANGIOGRAPHY CHEST, ABDOMEN AND PELVIS TECHNIQUE: Multidetector CT imaging through the chest, abdomen and pelvis was performed using the standard protocol during bolus administration of intravenous contrast. Multiplanar reconstructed images and MIPs were obtained and reviewed to evaluate the vascular anatomy. Precontrast images of the thorax were also obtained. CONTRAST: 100 ml Omnipaque 350 IV. COMPARISON: None FINDINGS: CTA CHEST FINDINGS Atherosclerotic calcifications aorta and coronary arteries with question coronary stents. No intramural hematoma identified on precontrast images. Normal aortic enhancement following contrast without aortic aneurysm or dissection. Pulmonary arteries appear grossly patent without evidence of pulmonary embolism on exam optimized for aortic imaging. Minimally enlarged LEFT hilar lymph node 13 mm short axis image 73. Small hiatal hernia. Dependent atelectasis in both lungs. No acute infiltrate, pleural effusion or pneumothorax. Bone island T6 vertebral body. Review of the MIP images confirms the above findings. CTA ABDOMEN AND PELVIS FINDINGS  Scattered atherosclerotic calcifications without aneurysm or dissection. Normal variant separate origin of common hepatic artery from aorta. Mild narrowing of proximal SMA and origin of celiac artery. Large gallstones dependently in gallbladder. Liver, spleen, pancreas, kidneys, and adrenal glands normal appearance. Normal appendix. Scattered diverticulosis of the descending and sigmoid colon without diverticulitis. Stomach and bowel loops otherwise unremarkable for technique. No mass, adenopathy, free fluid or inflammatory process. BILATERAL inguinal hernias containing fat. No acute osseous findings. Review of the MIP images confirms the above findings. IMPRESSION: Scattered atherosclerotic disease changes without evidence of aneurysm or dissection. Small hiatal hernia. Single minimally enlarged nonspecific LEFT hilar lymph node. Cholelithiasis. 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 BILATERAL inguinal hernias containing fat. Distal colonic diverticulosis without evidence of diverticulitis. Electronically Signed By: Ulyses SouthwardMark Boles M.D. On: 12/27/2013 13:34  Mr Brain Wo Contrast  12/27/2013 CLINICAL DATA: Acute onset of right lower extremity weakness. Former smoker. Hyperlipidemia. Hypertension. Seizures. EXAM: MRI HEAD WITHOUT CONTRAST MRA HEAD WITHOUT CONTRAST TECHNIQUE: Multiplanar, multiecho pulse sequences of the brain and surrounding structures were obtained without intravenous contrast. Angiographic images of the head were obtained using MRA technique without contrast. COMPARISON: 12/27/2013 head CT. 01/26/2007 brain MR. FINDINGS: MRI HEAD FINDINGS No acute infarct. No intracranial hemorrhage. Very mild nonspecific white matter type changes probably related to result of small vessel disease. No intracranial mass lesion noted on this unenhanced exam. Mild to moderate paranasal sinus mucosal thickening with opacification of majority of ethmoid sinus air cells. No obvious intracranial or intraorbital  extension. Cervical medullary junction, pituitary region, pineal region and orbital  structures unremarkable. Major intracranial vascular structures are patent. MRA HEAD FINDINGS Anterior circulation without medium or large size vessel significant stenosis or occlusion. Mild branch vessel irregularity most notable involving middle cerebral artery branches. Fetal type contribution to the left posterior cerebral artery. Ectatic vertebral arteries and basilar artery without high-grade stenosis. 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 Nonvisualized left anterior inferior cerebral artery. Mild irregularity and narrowing involving portions of the superior cerebellar artery, posterior cerebral artery distal branches, right anterior inferior cerebellar artery and posterior inferior cerebellar arteries. IMPRESSION: MRI HEAD: No acute infarct. Very mild nonspecific white matter type changes probably related to result of small vessel disease. Mild to moderate paranasal sinus mucosal thickening with opacification of majority of ethmoid sinus air cells. MRA HEAD: Mild branch vessel irregularity as noted above. Electronically Signed By: Bridgett Larsson M.D. On: 12/27/2013 17:56  Dg Chest Port 1 View  12/27/2013 CLINICAL DATA: Stroke EXAM: PORTABLE CHEST - 1 VIEW COMPARISON: CT ANGIO CHEST W/CM &/OR WO/CM dated 12/27/2013 FINDINGS: The heart size and mediastinal contours are within normal limits. Both lungs are clear. The visualized skeletal structures are unremarkable. IMPRESSION: No active disease. Electronically Signed By: Elige Ko On: 12/27/2013 13:56  Mr Maxine Glenn Head/brain Wo Cm  12/27/2013 CLINICAL DATA: Acute onset of right lower extremity weakness. Former smoker. Hyperlipidemia. Hypertension. Seizures. EXAM: MRI HEAD WITHOUT CONTRAST MRA HEAD WITHOUT CONTRAST TECHNIQUE: Multiplanar, multiecho pulse sequences of the brain and surrounding structures were obtained without intravenous contrast.  Angiographic images of the head were obtained using MRA technique without contrast. COMPARISON: 12/27/2013 head CT. 01/26/2007 brain MR. FINDINGS: MRI HEAD FINDINGS No acute infarct. No intracranial hemorrhage. Very mild nonspecific white matter type changes probably related to result of small vessel disease. No intracranial mass lesion noted on this unenhanced exam. Mild to moderate paranasal sinus mucosal thickening with opacification of majority of ethmoid sinus air cells. No obvious intracranial or intraorbital extension. Cervical medullary junction, pituitary region, pineal region and orbital structures unremarkable. Major intracranial vascular structures are patent. MRA HEAD FINDINGS Anterior circulation without medium or large size vessel significant stenosis or occlusion. Mild branch vessel irregularity most notable involving middle cerebral artery branches. Fetal type contribution to the left posterior cerebral artery. Ectatic vertebral arteries and basilar artery without high-grade stenosis. 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 Nonvisualized left anterior inferior cerebral artery. Mild irregularity and narrowing involving portions of the superior cerebellar artery, posterior cerebral artery distal branches, right anterior inferior cerebellar artery and posterior inferior cerebellar arteries. IMPRESSION: MRI HEAD: No acute infarct. Very mild nonspecific white matter type changes probably related to result of small vessel disease. Mild to moderate paranasal sinus mucosal thickening with opacification of majority of ethmoid sinus air cells. MRA HEAD: Mild branch vessel irregularity as noted above. Electronically Signed By: Bridgett Larsson M.D. On: 12/27/2013 17:56  Ct Cta Abd/pel W/cm &/or W/o Cm  12/27/2013 CLINICAL DATA: Chest pain radiating to back question aortic dissection, history coronary artery disease post MI, hypertension, former smoker EXAM: CT ANGIOGRAPHY CHEST, ABDOMEN  AND PELVIS TECHNIQUE: Multidetector CT imaging through the chest, abdomen and pelvis was performed using the standard protocol during bolus administration of intravenous contrast. Multiplanar reconstructed images and MIPs were obtained and reviewed to evaluate the vascular anatomy. Precontrast images of the thorax were also obtained. CONTRAST: 100 ml Omnipaque 350 IV. COMPARISON: None FINDINGS: CTA CHEST FINDINGS Atherosclerotic calcifications aorta and coronary arteries with question coronary stents. No intramural hematoma identified on precontrast images. Normal aortic enhancement following contrast without aortic  aneurysm or dissection. Pulmonary arteries appear grossly patent without evidence of pulmonary embolism on exam optimized for aortic imaging. Minimally enlarged LEFT hilar lymph node 13 mm short axis image 73. Small hiatal hernia. Dependent atelectasis in both lungs. No acute infiltrate, pleural effusion or pneumothorax. Bone island T6 vertebral body. Review of the MIP images confirms the above findings. CTA ABDOMEN AND PELVIS FINDINGS Scattered atherosclerotic calcifications without aneurysm or dissection. Normal variant separate origin of common hepatic artery from aorta. Mild narrowing of proximal SMA and origin of celiac artery. Large gallstones dependently in gallbladder. Liver, spleen, pancreas, kidneys, and adrenal glands normal appearance. Normal appendix. Scattered diverticulosis of the descending and sigmoid colon without diverticulitis. Stomach and bowel loops otherwise unremarkable for technique. No mass, adenopathy, free fluid or inflammatory process. BILATERAL inguinal hernias containing fat. No acute osseous findings. Review of the MIP images confirms the above findings. IMPRESSION: Scattered atherosclerotic disease changes without evidence of aneurysm or dissection. Small hiatal hernia. Single minimally enlarged nonspecific LEFT hilar lymph node. Cholelithiasis. 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 BILATERAL inguinal hernias containing fat. Distal colonic diverticulosis without evidence of diverticulitis. Electronically Signed By: Ulyses Southward M.D. On: 12/27/2013 13:34   Assessment/Plan:  Diagnosis: Left subcortical infarct with right hemiparesis, gives a history of prior CVA but was working full time as a Psychologist, occupational prior to admission, imaging shows no acute infarcts and no encephalomalacia  1. Does the need for close, 24 hr/day medical supervision in concert with the patient's rehab needs make it unreasonable for this patient to be served in a less intensive setting? Yes 2. Co-Morbidities requiring supervision/potential complications: Hypertension, peripheral vascular disease 3. Due to bladder management, bowel management, safety, skin/wound care, disease management, medication administration, pain management and patient education, does the patient require 24 hr/day rehab nursing? Yes 4. Does the patient require coordinated care of a physician, rehab nurse, PT (1-2 hrs/day, 5 days/week) and OT (1-2 hrs/day, 5 days/week) to address physical and functional deficits in the context of the above medical diagnosis(es)? Yes Addressing deficits in the following areas: balance, endurance, locomotion, strength, transferring, bowel/bladder control, bathing, dressing and toileting 5. Can the patient actively participate in an intensive therapy program of at least 3 hrs of therapy per day at least 5 days per week? Yes 6. The potential for patient to make measurable gains while on inpatient rehab is excellent 7. Anticipated functional outcomes upon discharge from inpatient rehab are modified independent with PT, modified independent with OT, n/a with SLP. 8. Estimated rehab length of stay to reach the above functional goals is: 7 days 9. Does the patient have adequate social supports to accommodate these discharge functional goals? Yes 10. Anticipated D/C setting:  Home 11. Anticipated post D/C treatments: Outpatient therapy 12. Overall Rehab/Functional Prognosis: excellent RECOMMENDATIONS:  This patient's condition is appropriate for continued rehabilitative care in the following setting: CIR  Patient has agreed to participate in recommended program. Yes  Note that insurance prior authorization may be required for reimbursement for recommended care.  Comment: Patient wishes to go over MRA,CTA with neuro team, told at [facility] that he had blockage of blood vessel in brain  12/29/2013  Revision History...      Date/Time User Action    12/29/2013 10:15 AM Erick Colace, MD Sign    12/29/2013 6:39 AM Charlton Amor, PA-C Pend   View Details Report    Routing History.Marland KitchenMarland Kitchen

## 2013-12-30 NOTE — Evaluation (Signed)
Occupational Therapy Assessment and Plan  Patient Details  Name: Justin Wells MRN: [id number] Date of Birth: 03-Aug-1951  OT Diagnosis: hemiplegia affecting dominant side Rehab Potential: Rehab Potential: Good ELOS: 2-3 days to discharge 01/01/14   Today's Date: 12/30/2013 Time: 1030-1130 and 1330-1400 and 1430-1500 Time Calculation (min): 60 min and 30 min and 30 min  Problem List:  Patient Active Problem List   Diagnosis Date Noted  . CVA (cerebral infarction) 12/29/2013  . Hypertension 12/28/2013  . Other and unspecified hyperlipidemia 12/28/2013  . Brainstem infarct not seen on MRI 12/27/2013  . Numbness and tingling 01/12/2013  . Peripheral vascular disease, unspecified 01/12/2013  . Pain in limb 01/12/2013  . Atherosclerosis of native arteries of the extremities with intermittent claudication 01/12/2013    Past Medical History:  Past Medical History  Diagnosis Date  . Peripheral arterial disease   . Hyperlipidemia   . Coronary artery disease   . Hypertension   . Barrett's esophagus   . Gastroesophageal reflux   . Cerebrovascular disease   . Myocardial infarction 2009    "during cardiac cath" (01/27/2013)  . H/O hiatal hernia   . NWGNFAOZ(308.6)     "probably monthly" (01/27/2013)  . Seizures     "when I had my heart attack in 2009" (01/27/2013)  . 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 Arthritis     "hands" (01/27/2013)   Past Surgical History:  Past Surgical History  Procedure Laterality Date  . Laparoscopic gastric banding  2010  . Cardiac catheterization    . Coronary angioplasty with stent placement  11/02/2007    "2" (01/27/2013)  . Laparoscopic nissen fundoplication  ?2001  . Femoral artery stent Right 01/26/2013  . Diagnostic laparoscopy      "twice after nissen; it kept coming apart " (01/27/2013)    Assessment & Plan Clinical  Impression: Justin Wells is a 63 y.o. right-handed male with history of hypertension, CAD status post stent x3, TIA, ischemic infarct in the setting of acute MI 2014. Independent prior to admission working full time  living with his wife. Patient admitted 12/27/2013 with right-sided weakness as well as dizziness and slurred speech. MRI of the brain with no acute findings. Cardiac enzymes negative. MRA of the head without stenosis or occlusion. Echocardiogram with ejection fraction of 57% grade 1 diastolic dysfunction. Patient did receive TPA. CTA of the head with posterior circulation mild branch vessel irregularity. CTA of the neck with plaque carotid bifurcation bilaterally with less than 50% diameter stenosis. Neurology services followup with suspect left brain posterior circulation small infarct not visualized on MRI. Maintained on aspirin and Plavix for CVA prophylaxis. Patient is tolerating a regular diet. Physical and occupational therapy evaluations completed with recommendations of physical medicine and rehabilitation consult. Admitted for comprehensive rehabilitation program   Patient transferred to CIR on 12/29/2013 .    Patient currently requires min with  self-care  transfers and mod I with self care secondary to decreased standing balance.  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  Patient will benefit from skilled intervention to increase independence with  self-care bathroom transfers prior to discharge home with care partner.  Anticipate patient will require 24 hour supervision and no further OT follow recommended.  OT - End of Session Activity Tolerance: Tolerates 30+ min activity with  multiple rests OT Assessment Rehab Potential: Good OT Patient demonstrates impairments in the following area(s): Balance;Endurance OT Basic ADL's Functional Problem(s): Other (comment) (Toilet transfers and tub transfers) OT Transfers Functional  Problem(s): Toilet;Tub/Shower OT Additional Impairment(s): None OT Plan OT Intensity: Minimum of 1-2 x/day, 45 to 90 minutes OT Frequency: 5 out of 7 days OT Duration/Estimated Length of Stay: 2-3 days to discharge 01/01/14 OT Treatment/Interventions: Balance/vestibular training;Discharge planning;DME/adaptive equipment instruction;Functional mobility training;Psychosocial support;Therapeutic Exercise;Therapeutic Activities;Self Care/advanced ADL retraining OT Self Feeding Anticipated Outcome(s): I OT Basic Self-Care Anticipated Outcome(s): mod I OT Toileting Anticipated Outcome(s): mod I OT Bathroom Transfers Anticipated Outcome(s): supervision OT Recommendation Patient destination: Home Follow Up Recommendations: None Equipment Recommended: None recommended by OT   Skilled Therapeutic Intervention Visit 1:  Headache pain subsided. Pt seen for initial evaluation and ADL retraining of grooming, dressing, shower with a focus on dynamic balance. Pt was able to stand to don/doff pants over feet, stand in shower and dress all with mod I. He needs steady A to walk to bathroom and transfer in and out of bathroom.  His wife was present and observed the session.  She stated that she worked in home health for 15 years and feels very confident that she can care for him at home and take him to outpt PT to work on his balance.  Consulted with his PT who agreed on a Saturday morning discharge and to work aggressively on his balance in OT and PT today and tomorrow.  Notified PA and SW of discharge plans.  Visit 2:  No c/o pain,  Pt seen to work on dynamic balance activities.  Pt ambulated to ADL apartment to practice Tub Bench transfers. Steady A to walk with supervision for tub bench transfer. He then ambulated to kitchen to stand and use RUE to remove 15 cans from high shelf and put the cans back He needed 3 short rest breaks during the session. He then ambulated back to his room and sat on his bed to visit  with his friend.  Visit 3:no c/o pain. Pt seen to work on dynamic balance activities. Pt ambulated to and from the gym with light contact guard.  He worked on sit to Statistician using dowel bar in both hands to reach overhead. Squats and  Stair stepping activities. Pt returned to room to rest in recliner.   OT Evaluation Precautions/Restrictions  Restrictions Weight Bearing Restrictions: No    Vital Signs Therapy Vitals Pulse Rate: 70 BP: 136/73 mmHg Patient Position, if appropriate: Sitting Pain Pain Assessment Pain Assessment: 0-10 Pain Score: 0-No pain Pain Type: Acute pain Pain Location: Head Pain Descriptors / Indicators: Aching Pain Frequency: Occasional Pain Onset: On-going Patients Stated Pain Goal: 0 Pain Intervention(s): Medication (See eMAR) Multiple Pain Sites: No Home Living/Prior Functioning Home Living Family/patient expects to be discharged to:: Private residence Living Arrangements: Spouse/significant other Available Help at Discharge: Family;Available 24 hours/day Type of Home: House Home Access: Stairs to enter [employer] of Steps: 3 Entrance Stairs-Rails: Right Home Layout: One level Additional Comments: 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  Lives With: Spouse Prior Function Level of Independence: Independent with basic ADLs;Independent with gait;Independent with transfers Driving: Yes Vocation: Full time employment ADL  refer to FIM Vision/Perception  Vision- Assessment Eye Alignment: Within Functional Limits Ocular Range of Motion: Within Functional Limits Alignment/Gaze Preference: Within Defined Limits Tracking/Visual Pursuits: Able to track stimulus in all quads without difficulty Saccades: Within functional limits Convergence: Within functional limits  Visual Fields: No apparent deficits Perception Comments: WFL  Cognition Overall Cognitive Status: Within Functional Limits for tasks assessed Orientation Level:  Oriented X4 Sensation Sensation Light Touch: Appears Intact;Impaired Detail Light Touch Impaired Details: Impaired RUE;Impaired RLE Stereognosis: Appears Intact Hot/Cold: Appears Intact Proprioception: Appears Intact Coordination Fine Motor Movements are Fluid and Coordinated: Yes Motor  Motor Motor: Hemiplegia Mobility  Bed Mobility Bed Mobility: Not assessed  Trunk/Postural Assessment  Cervical Assessment Cervical Assessment: Exceptions to WFL (flexion at 45 degrees elicits pain bil neck extensors) Cervical AROM Overall Cervical AROM Comments: limited in all directions and causing pain Thoracic Assessment Thoracic Assessment: Exceptions to [facility] Thoracic AROM Overall Thoracic AROM Comments: limited rotation Lumbar Assessment Lumbar Assessment: Exceptions to [facility] Lumbar AROM Overall Lumbar AROM Comments: limited rotation Postural Control Postural Control: Within Functional Limits  Balance Balance Balance Assessed: Yes Static Sitting Balance Static Sitting - Level of Assistance: 6: Modified independent (Device/Increase time) Dynamic Sitting Balance Dynamic Sitting - Level of Assistance: 5: Stand by assistance Static Standing Balance Static Standing - Level of Assistance: 5: Stand by assistance Dynamic Standing Balance Dynamic Standing - Level of Assistance: 4: Min assist Extremity/Trunk Assessment RUE Assessment RUE Assessment: Within Functional Limits LUE Assessment LUE Assessment: Within Functional Limits  FIM:  FIM - Toileting Toileting steps completed by patient: Adjust clothing prior to toileting;Performs perineal hygiene;Adjust clothing after toileting Toileting Assistive Devices: Grab bar or rail for support Toileting: 4: Steadying assist FIM - IT sales professional Transfer: 4: Bed > Chair or W/C: Min A (steadying Pt. > 75%)   Refer to Care Plan for Long Term Goals  Recommendations for other services: None  Discharge Criteria: Patient will be  discharged from OT if patient refuses treatment 3 consecutive times without medical reason, if treatment goals not met, if there is a change in medical status, if patient makes no progress towards goals or if patient is discharged from hospital.  The above assessment, treatment plan, treatment alternatives and goals were discussed and mutually agreed upon: by patient and by family  Harlene Ramus 12/30/2013, 12:20 PM

## 2013-12-30 NOTE — Progress Notes (Signed)
Patient information reviewed and entered into eRehab system by Vennessa Affinito, RN, CRRN, PPS Coordinator.  Information including medical coding and functional independence measure will be reviewed and updated through discharge.     Per nursing patient was given "Data Collection Information Summary for Patients in Inpatient Rehabilitation Facilities with attached "Privacy Act Statement-Health Care Records" upon admission.  

## 2013-12-30 NOTE — Care Management Note (Signed)
Inpatient Rehabilitation Center Individual Statement of Services  Patient Name:  Justin Wells  Date:  12/30/2013  Welcome to the Inpatient Rehabilitation Center.  Our goal is to provide you with an individualized program based on your diagnosis and situation, designed to meet your specific needs.  With this comprehensive rehabilitation program, you will be expected to participate in at least 3 hours of rehabilitation therapies Monday-Friday, with modified therapy programming on the weekends.  Your rehabilitation program will include the following services:  Physical Therapy (PT), Occupational Therapy (OT), 24 hour per day rehabilitation nursing, Case Management (Social Worker), Rehabilitation Medicine, Nutrition Services and Pharmacy Services  Weekly team conferences will be held on Wednesday to discuss your progress.  Your Social Worker will talk with you frequently to get your input and to update you on team discussions.  Team conferences with you and your family in attendance may also be held.  Expected length of stay: 3 days Overall anticipated outcome: mod/i level  Depending on your progress and recovery, your program may change. Your Social Worker will coordinate services and will keep you informed of any changes. Your Social Worker's name and contact numbers are listed  below.  The following services may also be recommended but are not provided by the Inpatient Rehabilitation Center:   Driving Evaluations  Home Health Rehabiltiation Services  Outpatient Rehabilitation Services  Vocational Rehabilitation   Arrangements will be made to provide these services after discharge if needed.  Arrangements include referral to agencies that provide these services.  Your insurance has been verified to be:  BCBS & Medicare Your primary doctor is:  Dr Lois HuxleyJames Davis  Pertinent information will be shared with your doctor and your insurance company.  Social Worker:  Dossie DerBecky Teryl Gubler, SW  [phone] or [phone]) [phone]  Information discussed with and copy given to patient by: Lucy Chrisebecca G Tracye Szuch, 12/30/2013, 1:04 PM

## 2013-12-30 NOTE — Evaluation (Signed)
Physical Therapy Assessment and Plan  Patient Details  Name: Justin Wells MRN: [id number] Date of Birth: 07-07-1951  PT Diagnosis: Difficulty walking, Dizziness and giddiness, Edema, Hemiparesis dominant and Impaired sensation Rehab Potential: Good ELOS: < 5 days   Today: 0805-0905 Time Calculation (min): 60 min  Problem List:  Patient Active Problem List   Diagnosis Date Noted  . CVA (cerebral infarction) 12/29/2013  . Hypertension 12/28/2013  . Other and unspecified hyperlipidemia 12/28/2013  . Brainstem infarct not seen on MRI 12/27/2013  . Numbness and tingling 01/12/2013  . Peripheral vascular disease, unspecified 01/12/2013  . Pain in limb 01/12/2013  . Atherosclerosis of native arteries of the extremities with intermittent claudication 01/12/2013    Past Medical History:  Past Medical History  Diagnosis Date  . Peripheral arterial disease   . Hyperlipidemia   . Coronary artery disease   . Hypertension   . Barrett's esophagus   . Gastroesophageal reflux   . Cerebrovascular disease   . Myocardial infarction 2009    "during cardiac cath" (01/27/2013)  . H/O hiatal hernia   . [id number](299.3)     "probably monthly" (01/27/2013)  . Seizures     "when I had my heart attack in 2009" (01/27/2013)  . 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 Arthritis     "hands" (01/27/2013)   Past Surgical History:  Past Surgical History  Procedure Laterality Date  . Laparoscopic gastric banding  2010  . Cardiac catheterization    . Coronary angioplasty with stent placement  11/02/2007    "2" (01/27/2013)  . Laparoscopic nissen fundoplication  ?2001  . Femoral artery stent Right 01/26/2013  . Diagnostic laparoscopy      "twice after nissen; it kept coming apart " (01/27/2013)    Assessment & Plan Clinical Impression:  Justin Wells is a 63 y.o. right-handed male  with history of hypertension, CAD status post stent x3, TIA, ischemic infarct in the setting of acute MI 2014. Independent prior to admission working full time living with his wife. Patient admitted 12/27/2013 with right-sided weakness as well as dizziness and slurred speech. MRI of the brain with no acute findings. Cardiac enzymes negative. MRA of the head without stenosis or occlusion. Echocardiogram with ejection fraction of 71% grade 1 diastolic dysfunction. Patient did receive TPA. CTA of the head with posterior circulation mild branch vessel irregularity. CTA of the neck with plaque carotid bifurcation bilaterally with less than 50% diameter stenosis. Neurology services followup with suspect left brain posterior circulation small infarct not visualized on MRI.  Patient transferred to CIR on 12/29/2013 .   Patient currently requires min assist with mobility secondary to muscle weakness and muscle joint tightness and impaired timing and sequencing, unbalanced muscle activation and decreased coordination.  Prior to hospitalization, patient was independent  with mobility and lived with Spouse in a House home.  He worked full time as a Building control surveyor.  Home access is 3Stairs to enter; R rail.  Patient will benefit from skilled PT intervention to maximize safe functional mobility, minimize fall risk and decrease caregiver burden for planned discharge home with 24 hour supervision.  Anticipate patient will benefit from follow up OP at discharge.  PT - End of Session Activity Tolerance: Tolerates < 10 min activity, no significant change in vital signs Endurance Deficit: Yes Endurance Deficit Description: fatigued after short distance  ambulation PT Assessment Rehab Potential: Good PT Patient demonstrates impairments in the following area(s): Balance;Endurance;Edema;Motor;Sensory PT Transfers Functional Problem(s): Bed Mobility;Bed to Chair;Car;Furniture;Floor PT Locomotion Functional Problem(s):  Ambulation;Wheelchair Mobility;Stairs PT Plan PT Intensity: Minimum of 1-2 x/day ,45 to 90 minutes PT Frequency: 5 out of 7 days PT Duration Estimated Length of Stay: < 5 days PT Treatment/Interventions: Ambulation/gait training;Balance/vestibular training;Discharge planning;DME/adaptive equipment instruction;Functional mobility training;Patient/family education;Neuromuscular re-education;Psychosocial support;Therapeutic Exercise;Therapeutic Activities;Stair training;UE/LE Strength taining/ROM;UE/LE Coordination activities;Wheelchair propulsion/positioning PT Transfers Anticipated Outcome(s): supervision overall PT Locomotion Anticipated Outcome(s): supervision gait x 150' controlled and community and up/down 5 steps R rail PT Recommendation Follow Up Recommendations: Outpatient PT Patient destination: Home Equipment Recommended: To be determined  Skilled Therapeutic Intervention- w/c>< recliner stand step transfers to R and L, min assist. Teaching of slow diaphragmatic breathing to address pt's anxiety and internal distractions due to chronic pain, multiple medical problems.   PT Evaluation Precautions/Restrictions Precautions Precautions: Fall Precaution Comments:** hx of syncope, " cold -sweaty episodes" per pt, repeated R hemiparesis of unknown etiology which passes; pt is not diabetic,but has hx of hypoglycemia**  Restrictions Weight Bearing Restrictions: No General   Vital SignsTherapy Vitals Dizzy upon standing up, but passes in less than 30 seconds Temp: 98.2 F (36.8 C) Temp src: Oral Pulse Rate: 70 BP: 136/73 mmHg Patient Position, if appropriate: Sitting  Pain Pain Assessment Pain Score: 3  Pain Location: Head Pain Descriptors / Indicators: Dull Pain Intervention(s): Medication (See eMAR) Home Living/Prior Functioning Home Living Available Help at Discharge: Family;Available 24 hours/day Type of Home: House Home Access: Stairs to enter [employer] of  Steps: 3 Entrance Stairs-Rails: Right Home Layout: One level Additional Comments: works as a Scientist, 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; uses reading glasses     Cognition Overall Cognitive Status: Within Functional Limits for tasks assessed Arousal/Alertness: Awake/alert Orientation Level: Oriented X4 Obvious anxiety, self-doubt about performance in rehab Sensation Sensation Light Touch: Appears Intact;Impaired Detail (diminished bil toes, R more affected than L) Proprioception: Appears Intact (bil ankles) Coordination Heel Shin Test: R with limited excursion, speed and accuracy Motor - R hemiparesis, difficulty with sustained activation    Mobility Bed Mobility Bed Mobility: Not assessed Transfers Transfers: Yes Stand Pivot Transfers: 4: Min assist Stand Pivot Transfer Details: Manual facilitation for weight shifting Locomotion  Ambulation Ambulation: Yes Ambulation/Gait Assistance: 4: Min assist Ambulation Distance (Feet): 50 Feet Assistive device: None Ambulation/Gait Assistance Details: Manual facilitation for weight shifting Gait Gait: Yes Gait Pattern: Impaired Gait Pattern: Step-through pattern;Decreased step length - right;Decreased step length - left;Decreased stance time - right;Decreased dorsiflexion - left;Decreased trunk rotation;Narrow base of support Gait velocity: slow Stairs / Additional Locomotion Stairs: Yes Stairs Assistance: 4: Min assist Stairs Assistance Details: Verbal cues for technique;Verbal cues for gait pattern Stair Management Technique: Two rails Number of Stairs: 5 Height of Stairs: 7 Wheelchair Mobility Wheelchair Mobility: Yes Wheelchair Assistance: 4: Min Lexicographer: Both upper extremities Wheelchair Parts Management: Needs assistance Distance: 25  Trunk/Postural Assessment  Cervical Assessment Cervical Assessment: Exceptions to WFL (flexion at 45 degrees elicits pain bil neck extensors) Cervical  AROM Overall Cervical AROM Comments: limited in all directions and causing pain Thoracic Assessment Thoracic Assessment: Exceptions to [facility] Thoracic AROM Overall Thoracic AROM Comments: limited rotation Lumbar Assessment Lumbar Assessment: Exceptions to [facility] Lumbar AROM Overall Lumbar AROM Comments: limited rotation Postural Control Postural Control: Within Functional Limits  Balance Balance Balance Assessed: Yes Static Sitting Balance Static Sitting - Level of Assistance: 6: Modified independent (Device/Increase time) Dynamic  Sitting Balance Dynamic Sitting - Level of Assistance: 5: Stand by assistance Static Standing Balance Static Standing - Level of Assistance: 5: Stand by assistance Dynamic Standing Balance Dynamic Standing - Level of Assistance:  (LOB posteriorly with external perturbations.) Extremity Assessment      RLE Assessment RLE Assessment: Exceptions to [facility] Hamstrings tight; mild edema dorsum of foot RLE Strength RLE Overall Strength Comments: grossly in sitting 4-/5 hip flex/abd/add, knee flex/ext, ankle DF/PF LLE Assessment LLE Assessment: Exceptions to [facility] Hamstrings tight LLE Strength LLE Overall Strength Comments: in sitting, grossly 5/5 except for 4/5 hip abd/add  FIM:  Basic transfer: 4 min assist W/c: 1, 25' Gait: assistance 4, min assist; distance 2, 61' Stairs: 2, 4-11 steps min assist     Refer to Care Plan for Long Term Goals  Recommendations for other services: Neuropsych due to pt's anxiety due to multiple medical problems and recurrent TIAs, chronic pain due to PAD and cervical stenosis  Discharge Criteria: Patient will be discharged from PT if patient refuses treatment 3 consecutive times without medical reason, if treatment goals not met, if there is a change in medical status, if patient makes no progress towards goals or if patient is discharged from hospital.  The above assessment, treatment plan, treatment alternatives and goals were  discussed and mutually agreed upon: by patient  Frederic Jericho 12/30/2013, 4:48 PM

## 2013-12-30 NOTE — IPOC Note (Signed)
Overall Plan of Care [facility](IPOC) Patient Details Name: Justin Wells MRN: [id number] DOB: [birth year]  [facility]  Hospital Problems: Active Problems:   CVA (cerebral infarction)     Functional Problem List: Nursing Endurance;Motor;Pain;Safety  PT Balance;Endurance;Edema;Motor;Sensory  OT Balance;Endurance  SLP    TR         Basic ADL's: OT Other (comment) (Toilet transfers and tub transfers)     Advanced  ADL's: OT       Transfers: PT Bed Mobility;Bed to Chair;Car;Furniture;Floor  OT Toilet;Tub/Shower     Locomotion: PT Ambulation;Wheelchair Mobility;Stairs     Additional Impairments: OT None  SLP        TR      Anticipated Outcomes Item Anticipated Outcome  Self Feeding I  Swallowing      Basic self-care  mod I  Toileting  mod I   Bathroom Transfers supervision  Bowel/Bladder  Continent of bowel and bladder  Transfers  supervision overall  Locomotion  supervision gait x 150' controlled and community and up/down 5 steps R rail  Communication     Cognition     Pain  </=4  Safety/Judgment  No falls with injury, independent   Therapy Plan: PT Intensity: Minimum of 1-2 x/day ,45 to 90 minutes PT Frequency: 5 out of 7 days PT Duration Estimated Length of Stay: < 5 days OT Intensity: Minimum of 1-2 x/day, 45 to 90 minutes OT Frequency: 5 out of 7 days OT Duration/Estimated Length of Stay: 2-3 days to discharge 01/01/14         Team Interventions: Nursing Interventions Patient/Family Education;Pain Management;Psychosocial Support  PT interventions Ambulation/gait training;Balance/vestibular training;Discharge planning;DME/adaptive equipment instruction;Functional mobility training;Patient/family education;Neuromuscular re-education;Psychosocial support;Therapeutic Exercise;Therapeutic Activities;Stair training;UE/LE Strength taining/ROM;UE/LE Coordination activities;Wheelchair propulsion/positioning  OT Interventions  Financial controllerBalance/vestibular training;Discharge planning;DME/adaptive equipment instruction;Functional mobility training;Psychosocial support;Therapeutic Exercise;Therapeutic Activities;Self Care/advanced ADL retraining  SLP Interventions    TR Interventions    SW/CM Interventions Discharge Planning;Psychosocial Support;Patient/Family Education    Team Discharge Planning: Destination: PT-Home ,OT- Home , SLP-  Projected Follow-up: PT-Outpatient PT, OT-  None, SLP-  Projected Equipment Needs: PT-To be determined, OT- None recommended by OT, SLP-  Equipment Details: PT- , OT-  Patient/family involved in discharge planning: PT- Patient,  OT-Patient;Family member/caregiver, SLP-   MD ELOS: 5-7 days Medical Rehab Prognosis:  Excellent Assessment: 63 y.o. right-handed male with history of hypertension, CAD status post stent x3, TIA, ischemic infarct in the setting of acute MI 2014. Independent prior to admission and driving living with his wife. Patient admitted 12/27/2013 with right-sided weakness as well as dizziness and slurred speech. MRI of the brain with no acute findings. Cardiac enzymes negative. MRA of the head without stenosis or occlusion. Echocardiogram with ejection fraction of 65% grade 1 diastolic dysfunction. Patient did receive TPA. CTA of the head with posterior circulation mild branch vessel irregularity. CTA of the neck with plaque carotid bifurcation bilaterally with less than 50% diameter stenosis. Neurology services followup with suspect left brain posterior circulation small infarct not visualized on MRI. Maintained on aspirin and Plavix for CVA prophylaxis   Now requiring 24/7 Rehab RN,MD, as well as CIR level PT, OT and SLP.  Treatment team will focus on ADLs and mobility with goals set at Mod I   See Team Conference Notes for weekly updates to the plan of care

## 2013-12-30 NOTE — Progress Notes (Signed)
Social Work Patient ID: Justin Wells, male   DOB: 1950-11-18, 63 y.o.   MRN: [id number] Team feels pt is ready for discharge on Sat and will reach independent level.  Awaiting response from MD regarding discharge on Sat. Dan-PA aware.

## 2013-12-30 NOTE — Progress Notes (Signed)
Subjective/Complaints: Pt slept poorly, anxious about first day of therapy No other complaints overnite Discussed angiogram results  Review of Systems - Negative except anxiety  Objective: Vital Signs: Blood pressure 103/69, pulse 60, temperature 98.2 F (36.8 C), temperature source Oral, resp. rate 18, height _0  (1.778 m), weight 89.721 kg (197 lb 12.8 oz), SpO2 96.00%. Ct Angio Head W/cm &/or Wo Cm  12/28/2013   CLINICAL DATA:  Right-sided weakness has resolved. Persistent headaches. Hyperlipidemia. Hypertension. Seizures. Prior myocardial infarction.  EXAM: CT ANGIOGRAPHY HEAD AND NECK  TECHNIQUE: Multidetector CT imaging of the head and neck was performed using the standard protocol during bolus administration of intravenous contrast. Multiplanar CT image reconstructions and MIPs were obtained to evaluate the vascular anatomy. Carotid stenosis measurements (when applicable) are obtained utilizing NASCET criteria, using the distal internal carotid diameter as the denominator.  CONTRAST:  31m OMNIPAQUE IOHEXOL 350 MG/ML SOLN  COMPARISON:  12/27/2013 MR brain and MR angiogram circle [facility].  FINDINGS: CTA HEAD FINDINGS  No intracranial hemorrhage.  No CT evidence of large acute infarct.  No intracranial enhancing lesion.  Opacification/mucosal thickening paranasal sinuses most notable left maxillary sinus with there is an air-fluid level. Acute sinusitis not excluded. Almost complete opacification of majority of the ethmoid sinus air cells without evidence of intraorbital extension of sinus disease.  Calcified cavernous segment internal carotid artery bilaterally with mild narrowing.  Mild irregularity middle cerebral artery branch vessels.  Fetal type origin of the left posterior communicating artery.  Slightly ectatic vertebral arteries without high-grade stenosis.  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  Posterior circulation mild branch vessel irregularity.  Review of  the MIP images confirms the above findings.  CTA NECK FINDINGS  Aberrant origin of the right subclavian artery.  Right vertebral artery and right common carotid artery with common origin.  Left vertebral artery arises directly from the aortic arch with mild narrowing proximal aspect.  Plaque carotid bifurcation bilaterally with less than 50% diameter stenosis bilaterally.  No worrisome primary neck mass identified.  Scattered normal to top-normal size lymph nodes throughout the neck of questionable significance/etiology.  Mild cervical spondylotic changes with various degrees of spinal stenosis and foraminal narrowing.  Circumferential thickening of the cervical/ upper thoracic esophagus may be related to under distension although evaluation is limited.  Caries of residual teeth.  Review of the MIP images confirms the above findings.  IMPRESSION: CTA HEAD:  Opacification/mucosal thickening paranasal sinuses most notable left maxillary sinus with there is an air-fluid level. Acute sinusitis not excluded. Almost complete opacification of majority of the ethmoid sinus air cells without evidence of intraorbital extension of sinus disease.  Calcified cavernous segment internal carotid artery bilaterally with mild narrowing.  Mild irregularity middle cerebral artery branch vessels.  Fetal type origin of the left posterior communicating artery.  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  Posterior circulation mild branch vessel irregularity.  CTA NECK :  Aberrant origin of the right subclavian artery.  Right vertebral artery and right common carotid artery with common origin.  Left vertebral artery arises directly from the aortic arch with mild narrowing proximal aspect.  Plaque carotid bifurcation bilaterally with less than 50% diameter stenosis bilaterally.  Scattered normal to top-normal size lymph nodes throughout the neck of questionable significance/etiology.  Circumferential thickening of the  cervical/ upper thoracic esophagus may be related to under distension although evaluation is limited.   Electronically Signed   By: SChauncey CruelM.D.   On: 12/28/2013 13:09  Ct Angio Neck W/cm &/or Wo/cm  12/28/2013   CLINICAL DATA:  Right-sided weakness has resolved. Persistent headaches. Hyperlipidemia. Hypertension. Seizures. Prior myocardial infarction.  EXAM: CT ANGIOGRAPHY HEAD AND NECK  TECHNIQUE: Multidetector CT imaging of the head and neck was performed using the standard protocol during bolus administration of intravenous contrast. Multiplanar CT image reconstructions and MIPs were obtained to evaluate the vascular anatomy. Carotid stenosis measurements (when applicable) are obtained utilizing NASCET criteria, using the distal internal carotid diameter as the denominator.  CONTRAST:  27m OMNIPAQUE IOHEXOL 350 MG/ML SOLN  COMPARISON:  12/27/2013 MR brain and MR angiogram circle Willis.  FINDINGS: CTA HEAD FINDINGS  No intracranial hemorrhage.  No CT evidence of large acute infarct.  No intracranial enhancing lesion.  Opacification/mucosal thickening paranasal sinuses most notable left maxillary sinus with there is an air-fluid level. Acute sinusitis not excluded. Almost complete opacification of majority of the ethmoid sinus air cells without evidence of intraorbital extension of sinus disease.  Calcified cavernous segment internal carotid artery bilaterally with mild narrowing.  Mild irregularity middle cerebral artery branch vessels.  Fetal type origin of the left posterior communicating artery.  Slightly ectatic vertebral arteries without high-grade stenosis.  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  Posterior circulation mild branch vessel irregularity.  Review of the MIP images confirms the above findings.  CTA NECK FINDINGS  Aberrant origin of the right subclavian artery.  Right vertebral artery and right common carotid artery with common origin.  Left vertebral artery  arises directly from the aortic arch with mild narrowing proximal aspect.  Plaque carotid bifurcation bilaterally with less than 50% diameter stenosis bilaterally.  No worrisome primary neck mass identified.  Scattered normal to top-normal size lymph nodes throughout the neck of questionable significance/etiology.  Mild cervical spondylotic changes with various degrees of spinal stenosis and foraminal narrowing.  Circumferential thickening of the cervical/ upper thoracic esophagus may be related to under distension although evaluation is limited.  Caries of residual teeth.  Review of the MIP images confirms the above findings.  IMPRESSION: CTA HEAD:  Opacification/mucosal thickening paranasal sinuses most notable left maxillary sinus with there is an air-fluid level. Acute sinusitis not excluded. Almost complete opacification of majority of the ethmoid sinus air cells without evidence of intraorbital extension of sinus disease.  Calcified cavernous segment internal carotid artery bilaterally with mild narrowing.  Mild irregularity middle cerebral artery branch vessels.  Fetal type origin of the left posterior communicating artery.  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  Posterior circulation mild branch vessel irregularity.  CTA NECK :  Aberrant origin of the right subclavian artery.  Right vertebral artery and right common carotid artery with common origin.  Left vertebral artery arises directly from the aortic arch with mild narrowing proximal aspect.  Plaque carotid bifurcation bilaterally with less than 50% diameter stenosis bilaterally.  Scattered normal to top-normal size lymph nodes throughout the neck of questionable significance/etiology.  Circumferential thickening of the cervical/ upper thoracic esophagus may be related to under distension although evaluation is limited.   Electronically Signed   By: SChauncey CruelM.D.   On: 12/28/2013 13:09   Results for orders placed during  the hospital encounter of 12/27/13 (from the past 72 hour(s))  ETHANOL     Status: None   Collection Time    12/27/13 12:35 PM      Result Value Ref Range   Alcohol, Ethyl (B) <11  0 - 11 mg/dL   Comment:  LOWEST DETECTABLE LIMIT FOR     SERUM ALCOHOL IS 11 mg/dL     FOR MEDICAL PURPOSES ONLY  PROTIME-INR     Status: None   Collection Time    12/27/13 12:35 PM      Result Value Ref Range   Prothrombin Time 12.7  11.6 - 15.2 seconds   INR 0.97  0.00 - 1.49  APTT     Status: None   Collection Time    12/27/13 12:35 PM      Result Value Ref Range   aPTT 24  24 - 37 seconds  CBC     Status: None   Collection Time    12/27/13 12:35 PM      Result Value Ref Range   WBC 8.2  4.0 - 10.5 K/uL   RBC 4.85  4.22 - 5.81 MIL/uL   Hemoglobin 14.7  13.0 - 17.0 g/dL   HCT 42.1  39.0 - 52.0 %   MCV 86.8  78.0 - 100.0 fL   MCH 30.3  26.0 - 34.0 pg   MCHC 34.9  30.0 - 36.0 g/dL   RDW 14.7  11.5 - 15.5 %   Platelets 178  150 - 400 K/uL  DIFFERENTIAL     Status: Abnormal   Collection Time    12/27/13 12:35 PM      Result Value Ref Range   Neutrophils Relative % 65  43 - 77 %   Neutro Abs 5.4  1.7 - 7.7 K/uL   Lymphocytes Relative 16  12 - 46 %   Lymphs Abs 1.3  0.7 - 4.0 K/uL   Monocytes Relative 11  3 - 12 %   Monocytes Absolute 0.9  0.1 - 1.0 K/uL   Eosinophils Relative 7 (*) 0 - 5 %   Eosinophils Absolute 0.6  0.0 - 0.7 K/uL   Basophils Relative 1  0 - 1 %   Basophils Absolute 0.1  0.0 - 0.1 K/uL  COMPREHENSIVE METABOLIC PANEL     Status: Abnormal   Collection Time    12/27/13 12:35 PM      Result Value Ref Range   Sodium 136 (*) 137 - 147 mEq/L   Potassium 4.1  3.7 - 5.3 mEq/L   Chloride 99  96 - 112 mEq/L   CO2 24  19 - 32 mEq/L   Glucose, Bld 76  70 - 99 mg/dL   BUN 12  6 - 23 mg/dL   Creatinine, Ser 0.92  0.50 - 1.35 mg/dL   Calcium 8.7  8.4 - 10.5 mg/dL   Total Protein 6.8  6.0 - 8.3 g/dL   Albumin 3.7  3.5 - 5.2 g/dL   AST 24  0 - 37 U/L   ALT 19  0 - 53  U/L   Alkaline Phosphatase 88  39 - 117 U/L   Total Bilirubin 0.4  0.3 - 1.2 mg/dL   GFR calc non Af Amer 90 (*) >90 mL/min   GFR calc Af Amer >90  >90 mL/min   Comment: (NOTE)     The eGFR has been calculated using the CKD EPI equation.     This calculation has not been validated in all clinical situations.     eGFR's persistently <90 mL/min signify possible Chronic Kidney     Disease.  Randolm Idol, ED     Status: None   Collection Time    12/27/13 12:41 PM      Result Value Ref Range  Troponin i, poc 0.00  0.00 - 0.08 ng/mL   Comment 3            Comment: Due to the release kinetics of cTnI,     a negative result within the first hours     of the onset of symptoms does not rule out     myocardial infarction with certainty.     If myocardial infarction is still suspected,     repeat the test at appropriate intervals.  I-STAT CHEM 8, ED     Status: Abnormal   Collection Time    12/27/13 12:42 PM      Result Value Ref Range   Sodium 138  137 - 147 mEq/L   Potassium 4.0  3.7 - 5.3 mEq/L   Chloride 101  96 - 112 mEq/L   BUN 11  6 - 23 mg/dL   Creatinine, Ser 1.00  0.50 - 1.35 mg/dL   Glucose, Bld 77  70 - 99 mg/dL   Calcium, Ion 1.10 (*) 1.13 - 1.30 mmol/L   TCO2 23  0 - 100 mmol/L   Hemoglobin 14.6  13.0 - 17.0 g/dL   HCT 43.0  39.0 - 52.0 %  CBG MONITORING, ED     Status: None   Collection Time    12/27/13  1:20 PM      Result Value Ref Range   Glucose-Capillary 72  70 - 99 mg/dL  URINE RAPID DRUG SCREEN (HOSP PERFORMED)     Status: Abnormal   Collection Time    12/27/13  2:47 PM      Result Value Ref Range   Opiates POSITIVE (*) NONE DETECTED   Cocaine NONE DETECTED  NONE DETECTED   Benzodiazepines NONE DETECTED  NONE DETECTED   Amphetamines NONE DETECTED  NONE DETECTED   Tetrahydrocannabinol NONE DETECTED  NONE DETECTED   Barbiturates NONE DETECTED  NONE DETECTED   Comment:            DRUG SCREEN FOR MEDICAL PURPOSES     ONLY.  IF CONFIRMATION IS NEEDED      FOR ANY PURPOSE, NOTIFY LAB     WITHIN 5 DAYS.                LOWEST DETECTABLE LIMITS     FOR URINE DRUG SCREEN     Drug Class       Cutoff (ng/mL)     Amphetamine      1000     Barbiturate      200     Benzodiazepine   284     Tricyclics       132     Opiates          300     Cocaine          300     THC              50  URINALYSIS, ROUTINE W REFLEX MICROSCOPIC     Status: None   Collection Time    12/27/13  2:47 PM      Result Value Ref Range   Color, Urine YELLOW  YELLOW   APPearance CLEAR  CLEAR   Specific Gravity, Urine 1.024  1.005 - 1.030   pH 6.0  5.0 - 8.0   Glucose, UA NEGATIVE  NEGATIVE mg/dL   Hgb urine dipstick NEGATIVE  NEGATIVE   Bilirubin Urine NEGATIVE  NEGATIVE   Ketones, ur NEGATIVE  NEGATIVE mg/dL   Protein,  ur NEGATIVE  NEGATIVE mg/dL   Urobilinogen, UA 1.0  0.0 - 1.0 mg/dL   Nitrite NEGATIVE  NEGATIVE   Leukocytes, UA NEGATIVE  NEGATIVE   Comment: MICROSCOPIC NOT DONE ON URINES WITH NEGATIVE PROTEIN, BLOOD, LEUKOCYTES, NITRITE, OR GLUCOSE <1000 mg/dL.  MRSA PCR SCREENING     Status: None   Collection Time    12/27/13  6:32 PM      Result Value Ref Range   MRSA by PCR NEGATIVE  NEGATIVE   Comment:            The GeneXpert MRSA Assay (FDA     approved for NASAL specimens     only), is one component of a     comprehensive MRSA colonization     surveillance program. It is not     intended to diagnose MRSA     infection nor to guide or     monitor treatment for     MRSA infections.  HEMOGLOBIN A1C     Status: None   Collection Time    12/28/13  2:37 AM      Result Value Ref Range   Hemoglobin A1C 5.4  <5.7 %   Comment: (NOTE)                                                                               According to the ADA Clinical Practice Recommendations for 2011, when     HbA1c is used as a screening test:      >=6.5%   Diagnostic of Diabetes Mellitus               (if abnormal result is confirmed)     5.7-6.4%   Increased risk of  developing Diabetes Mellitus     References:Diagnosis and Classification of Diabetes Mellitus,Diabetes     VOZD,6644,03(KVQQV 1):S62-S69 and Standards of Medical Care in             Diabetes - 2011,Diabetes Care,2011,34 (Suppl 1):S11-S61.   Mean Plasma Glucose 108  <117 mg/dL   Comment: Performed at [facility]     Status: None   Collection Time    12/28/13  2:38 AM      Result Value Ref Range   Cholesterol 151  0 - 200 mg/dL   Triglycerides 131  <150 mg/dL   HDL 40  >39 mg/dL   Total CHOL/HDL Ratio 3.8     VLDL 26  0 - 40 mg/dL   LDL Cholesterol 85  0 - 99 mg/dL   Comment:            Total Cholesterol/HDL:CHD Risk     Coronary Heart Disease Risk Table                         Men   Women      1/2 Average Risk   3.4   3.3      Average Risk       5.0   4.4      2 X Average Risk   9.6   7.1      3 X Average Risk  23.4  11.0                Use the calculated Patient Ratio     above and the CHD Risk Table     to determine the patient's CHD Risk.                ATP III CLASSIFICATION (LDL):      <100     mg/dL   Optimal      100-129  mg/dL   Near or Above                        Optimal      130-159  mg/dL   Borderline      160-189  mg/dL   High      >190     mg/dL   Very High       General: No acute distress Mood and affect are appropriate Heart: Regular rate and rhythm no rubs murmurs or extra sounds Lungs: Clear to auscultation, breathing unlabored, no rales or wheezes Abdomen: Positive bowel sounds, soft nontender to palpation, nondistended Extremities: No clubbing, cyanosis, or edema Skin: No evidence of breakdown, no evidence of rash Neurologic: Cranial nerves II through XII intact, motor strength is 5/5 in Leftdeltoid, bicep, tricep, grip, hip flexor, knee extensors, ankle dorsiflexor and plantar flexor, 3/5 RUE, 4/5 R LE Sensory exam normal sensation to light touch and proprioception in bilateral upper and lower extremities Cerebellar exam  normal finger to nose to finger as well as heel to shin in bilateral upper and lower extremities Musculoskeletal: Full range of motion in all 4 extremities. No joint swelling  Assessment/Plan: 1. Functional deficits secondary to Left subcortical infarct which require 3+ hours per day of interdisciplinary therapy in a comprehensive inpatient rehab setting. Physiatrist is providing close team supervision and 24 hour management of active medical problems listed below. Physiatrist and rehab team continue to assess barriers to discharge/monitor patient progress toward functional and medical goals. FIM:       FIM - Toileting Toileting steps completed by patient: Adjust clothing prior to toileting;Performs perineal hygiene;Adjust clothing after toileting Toileting Assistive Devices: Grab bar or rail for support Toileting: 4: Steadying assist           Comprehension Comprehension Mode: Auditory Comprehension: 7-Follows complex conversation/direction: With no assist  Expression Expression Mode: Verbal Expression: 7-Expresses complex ideas: With no assist  Social Interaction Social Interaction: 6-Interacts appropriately with others with medication or extra time (anti-anxiety, antidepressant).  Problem Solving Problem Solving: 6-Solves complex problems: With extra time  Memory Memory: 7-Complete Independence: No helper  Medical Problem List and Plan:  1. 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 DVT Prophylaxis/Anticoagulation: SCDs. Monitor for any signs of DVT  3. Pain Management: Oxycodone as needed.  4. Mood/depression. Paxil 20 mg daily. Provide emotional support  5. Neuropsych: This patient is capable of making decisions on his own behalf.  6. Hypertension. Lisinopril 5 mg daily. Monitor with increased mobility  7. CAD status post stenting x3. Continue aspirin Plavix therapy. No chest pain and shortness of breath. Cardiac enzymes negative.  8.  Hyperlipidemia. Lovaza  9. Resolving bronchitis: robitussin prn, IS   LOS (Days) 1 A FACE TO FACE EVALUATION WAS PERFORMED  Charlett Blake 12/30/2013, 7:00 AM

## 2013-12-31 ENCOUNTER — Inpatient Hospital Stay (HOSPITAL_COMMUNITY): Payer: BC Managed Care – PPO | Admitting: Occupational Therapy

## 2013-12-31 ENCOUNTER — Inpatient Hospital Stay (HOSPITAL_COMMUNITY): Payer: BC Managed Care – PPO | Admitting: *Deleted

## 2013-12-31 ENCOUNTER — Inpatient Hospital Stay (HOSPITAL_COMMUNITY): Payer: BC Managed Care – PPO

## 2013-12-31 DIAGNOSIS — I6330 Cerebral infarction due to thrombosis of unspecified cerebral artery: Secondary | ICD-10-CM

## 2013-12-31 MED ORDER — OXYCODONE-ACETAMINOPHEN 5-325 MG PO TABS: 1.0000 | ORAL_TABLET | Freq: Four times a day (QID) | ORAL | Status: DC | PRN

## 2013-12-31 MED ORDER — PAROXETINE HCL 20 MG PO TABS: 20.0000 mg | ORAL_TABLET | ORAL | Status: DC

## 2013-12-31 MED ORDER — ASPIRIN 325 MG PO TBEC: 325.0000 mg | DELAYED_RELEASE_TABLET | Freq: Every day | ORAL | Status: DC

## 2013-12-31 MED ORDER — LISINOPRIL 5 MG PO TABS: 5.0000 mg | ORAL_TABLET | Freq: Every day | ORAL | Status: DC

## 2013-12-31 MED ORDER — CLOPIDOGREL BISULFATE 75 MG PO TABS: 75.0000 mg | ORAL_TABLET | Freq: Every day | ORAL | Status: DC

## 2013-12-31 MED ORDER — RANITIDINE HCL 150 MG PO TABS: 150.0000 mg | ORAL_TABLET | Freq: Two times a day (BID) | ORAL | Status: DC

## 2013-12-31 NOTE — Progress Notes (Signed)
Occupational Therapy Session Note  Patient Details  Name: Justin ChimesGeorge F Betke MRN: [id number] Date of Birth: 1951-02-08  Today's Date: 12/31/2013 Time: 1300-1330 Time Calculation (min): 30 min  Short Term Goals: Week 1:     Skilled Therapeutic Interventions/Progress Updates:    Pt sitting in recliner with wife present.  Went over toilet, shower, tub bench transfers and functional mobility.  Addressed walking to bathroom with emphasis on being on pt's right side for safety    Wife needed just one instructional cue for hand placement to guard pt in case of fall.  Pt ambulated to toilet, shower stall, and then down to ADL tub.  Pt was supervision with transfers using DME.  Pt. Plans to use shower stall when he goes home.  Reviewed UE HEP.  Instructed pt on shoulder, humeral alignment when raising arm past 90 degrees.  Ambulated back to room and laid down in bed.  Pt reiterated how other therapist told him to stop and regroup before he started walking and also to think on a sweet moment for relaxation for him to meditate on.  He teared up when he spoke of his quiet time in the morning when he sits in living room and reads BIBLE before he goes to work.  Left pt in bed wih call bell and phone in reach.    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

## 2013-12-31 NOTE — Progress Notes (Addendum)
Physical Therapy Discharge Summary  Patient Details  Name: Justin Wells MRN: [id number] Date of Birth: Jul 04, 1951  Today's Date: 12/31/2013 Time: 0900-1000; 1100-1200 Time Calculation (min): 60 min, 60 min  Patient has met 10 of 10 long term goals due to improved activity tolerance, improved balance, increased strength, increased range of motion, decreased pain, ability to compensate for deficits, functional use of  right upper extremity and right lower extremity, improved attention and improved awareness.  Patient to discharge at an ambulatory level Supervision.   Patient's care partner is independent to provide the necessary cognitive assistance at discharge.  Reasons goals not met: n/a  Recommendation:  Patient will benefit from ongoing skilled PT services in outpatient setting to continue to advance safe functional mobility, address ongoing impairments in balance, activity tolerance, locomotion, and minimize fall risk.  Equipment: No equipment provided pt owns RW  Reasons for discharge: treatment goals met and discharge from hospital; wife is a former CNA and feels she can supervise pt well; signs and symptoms resolving  Patient/family agrees with progress made and goals achieved: Yes  PT Discharge  Tx 1:  Berg balance assessment.  Patient demonstrates increased fall risk as noted by score of 48/56 on Berg Balance Scale.  (<36= high risk for falls, close to 100%; 37-45 significant >80%; 46-51 moderate >50%; 52-55 lower >25%) Floor transfers x 2 with cues for technique of R knee up, pushing with LLE from tall kneeling; [place] for balance retraining, gait 150' Rw with supervision in controlled and community settings.  Up/down stairs 2 rails step- to method with supervision.  Tx 2:  Wife trained in gait on level ground, steps, basic transfers, simulated car transfers, HEP of hamstring and heel cord stretches; [place] for balance retraining.  Therapist discussed with pt and  wife methods to increase activity/life long fitness.  She observed advanced gait through obstacle course, facilitation of balance strategies. Pt steadier now than earlier today; he is appropriate for ambulation with hand hold assist or close supervision without AD.  Wife stated that they have his previous RW in garage and can use it PRN.  Order with [facility] for RW cancelled by therapist.  Safety plan in room now reflects that pt 's wife can ambulate him to/from toilet. 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 Restrictions Weight Bearing Restrictions: No Vital Signs Therapy Vitals Temp: 97.9 F (36.6 C) Temp src: Oral Pulse Rate: 79 Resp: 18 BP: 122/68 mmHg Patient Position, if appropriate: Sitting Oxygen Therapy SpO2: 96 % O2 Device: None (Room air) Pain  no pain; premedicated for HA Vision/Perception     Cognition Overall Cognitive Status: Within Functional Limits for tasks assessed Arousal/Alertness: Awake/alert Orientation Level: Oriented X4 Sensation Sensation Light Touch: Appears Intact;Impaired Detail (diminished bil toes, R more affected than L) Proprioception: Appears Intact (bil ankles) Coordination Heel Shin Test: R with limited excursion, speed and accuracy Motor  Motor Motor: Hemiplegia Motor - Discharge Observations: strength improved since yesterday' initial evaluation  Mobility Bed Mobility Bed Mobility:  (modified independent) Transfers Transfers: Yes Stand Pivot Transfers: 5: Supervision Stand Pivot Transfer Details: Verbal cues for precautions/safety Locomotion  Ambulation Ambulation: Yes Ambulation/Gait Assistance: 5: Supervision Ambulation Distance (Feet): 150 Feet Assistive device: None;Rolling walker Ambulation/Gait Assistance Details: Verbal cues for precautions/safety Gait Gait: Yes Gait Pattern: Step-through  pattern;Decreased trunk rotation;Narrow base of support;Decreased hip/knee flexion - right Gait velocity: 2.05'/seco High Level Ambulation High Level Ambulation:  Backwards walking;Direction changes Backwards Walking: close supervision Direction Changes: close supervision Stairs / Additional Locomotion Stairs: Yes Stairs Assistance: 5: Supervision Stairs Assistance Details: Verbal cues for precautions/safety Stair Management Technique: One rail Right Number of Stairs: 5 Height of Stairs: 7 Wheelchair Mobility Wheelchair Mobility: No  Trunk/Postural Assessment  Cervical Assessment Cervical Assessment: Exceptions to [facility] (flexion at 45 degrees elicits pain bil neck extensors) Cervical AROM Overall Cervical AROM Comments: limited in all directions and causing pain Thoracic Assessment Thoracic Assessment: Exceptions to [facility] Thoracic AROM Overall Thoracic AROM Comments: limited rotation Lumbar Assessment Lumbar Assessment: Exceptions to [facility] Lumbar AROM Overall Lumbar AROM Comments: limited rotation Postural Control Postural Control: Within Functional Limits  Balance Balance Balance Assessed: Yes Standardized Balance Assessment Standardized Balance Assessment: Berg Balance Test Berg Balance Test Sit to Stand: Able to stand without using hands and stabilize independently Standing Unsupported: Able to stand safely 2 minutes Sitting with Back Unsupported but Feet Supported on Floor or Stool: Able to sit safely and securely 2 minutes Stand to Sit: Sits safely with minimal use of hands Transfers: Able to transfer safely, minor use of hands Standing Unsupported with Eyes Closed: Able to stand 10 seconds safely Standing Ubsupported with Feet Together: Able to place feet together independently and stand 1 minute safely From Standing, Reach Forward with Outstretched Arm: Can reach forward >12 cm safely (5") From Standing Position, Pick up Object from Floor: Able to pick up shoe, needs  supervision From Standing Position, Turn to Look Behind Over each Shoulder: Looks behind one side only/other side shows less weight shift Turn 360 Degrees: Able to turn 360 degrees safely but slowly Standing Unsupported, Alternately Place Feet on Step/Stool: Able to complete >2 steps/needs minimal assist Standing Unsupported, One Foot in Front: Able to place foot tandem independently and hold 30 seconds Standing on One Leg: Able to lift leg independently and hold > 10 seconds Total Score: 48 Static Sitting Balance Static Sitting - Level of Assistance: 6: Modified independent (Device/Increase time) Dynamic Sitting Balance Dynamic Sitting - Level of Assistance: 7: Independent Static Standing Balance Static Standing - Level of Assistance: 6: Modified independent (Device/Increase time) Dynamic Standing Balance Dynamic Standing - Level of Assistance: 5: Stand by assistance Extremity Assessment      RLE Assessment; tight hamstrings and heel cords RLE Assessment: Exceptions to [facility] RLE Strength RLE Overall Strength Comments: grossly in sitting 4/5 hip flexors/abd/add, knee ext, ankle DF; knee flex 4+/5 LLE Assessment; tight hamstrings and heel cords LLE Assessment: Exceptions to [facility] LLE Strength LLE Overall Strength Comments: in sitting, grossly 5/5 except for 4/5 hip abd/add  See FIM for current functional status  Frederic Jericho 12/31/2013, 4:26 PM

## 2013-12-31 NOTE — Progress Notes (Signed)
Social Work Patient ID: Justin ChimesGeorge F Backlund, male   DOB: [birth year], 63 y.o.   MRN: [id number] MD feels medically ready for discharge tomorrow.  Pt is pleased with plan and feels ready.

## 2013-12-31 NOTE — Discharge Summary (Signed)
NAME:  Justin Wells, Justin Wells               ACCOUNT NO.:  [id number]  MEDICAL RECORD NO.:  [id number]  LOCATION:  4M04C                        FACILITY:  [facility]  PHYSICIAN:  Erick ColaceAndrew E. Kirsteins, M.D.DATE OF BIRTH:  1951/08/05  DATE OF ADMISSION:  12/29/2013 DATE OF DISCHARGE:  01/01/2014                              DISCHARGE SUMMARY   DISCHARGE DIAGNOSES: 1. 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 2. Sequential compression devices for deep vein thrombosis     prophylaxis. 3. Depression. 4. Hypertension. 5. Coronary artery disease with stenting. 6. Hyperlipidemia. 7. Resolving bronchitis.  HISTORY OF PRESENT ILLNESS:  This is a 63 year old right-handed male with history of hypertension, coronary artery disease as well as CVA. He was independent prior to admission working full time living with his wife.  Admitted December 27, 2013, with right-sided weakness, as well as dizziness and slurred speech.  MRI of the brain with no acute findings. Cardiac enzymes negative.  MRA of the head without stenosis or occlusion.  Echocardiogram with ejection fraction of 65% grade 1 diastolic dysfunction.  The patient did receive tPA.  CTA of the head with posterior circulation mild branch vessel irregularity.  CTA of the neck with plaque carotid bifurcation bilaterally with less than 50% diameter stenosis.  Neurology Services consulted, suspect left brain posterior circulation infarct not visualized on MRI.  Maintained on aspirin and Plavix for CVA prophylaxis.  The patient was tolerating a regular diet.  Physical and occupational therapy ongoing.  The patient was admitted for a comprehensive rehab program.  PAST MEDICAL HISTORY:  See discharge diagnoses.  SOCIAL HISTORY:  Lives with spouse.  FUNCTIONAL HISTORY PRIOR TO ADMISSION:  Independent, working full time.  FUNCTIONAL STATUS UPON ADMISSION TO REHAB SERVICES:  +1 person for equipment use, minimal assist  to ambulate with a rolling walker, min assist with self-care and activities of daily living.  PHYSICAL EXAMINATION:  VITAL SIGNS:  Blood pressure 127/79, pulse 65, temperature 97.6, respirations 16. GENERAL:  This was an alert male, in no acute distress, oriented x3. HEENT:  Pupils were round and reactive to light. LUNGS:  Clear to auscultation. CARDIAC:  Regular rate and rhythm. ABDOMEN:  Soft, nontender.  Good bowel sounds.  REHABILITATION HOSPITAL COURSE:  The patient was admitted to Inpatient Rehab Services with therapies initiated on a 3-hour daily basis consisting of physical therapy, occupational therapy, and 24-hour rehabilitation nursing.  The following issues were addressed during the patient's rehabilitation stay.  Pertaining to Mr. Kolasa's thrombotic left subcortical infarction remained stable, maintained on aspirin and Plavix therapy for CVA prophylaxis.  He would follow up Neurology Services.  He remained on Paxil for history of depression, emotional support provided.  He was participating fully with his therapies.  Blood pressures were well controlled on lisinopril with no orthostatic changes.  He would follow up with his primary MD.  He did have a history of coronary artery disease with stenting.  He remained on aspirin and Plavix therapy.  No chest pain or shortness of breath.  Lovaza ongoing for hyperlipidemia.  The patient with no bowel or bladder disturbances. The patient received weekly collaborative interdisciplinary team conferences to discuss estimated length  of stay, family teaching, and any barriers to his discharge.  He was ambulating extended distances with an assistive device supervision.  Good awareness of his deficits. Modified independent with self-care, mild decrease in standing balance. He was able to don and doff his shoes and socks without any difficulty. The patient made significant gains throughout his short rehab course. He was ambulating the  extended distances of the hallways on the rehab unit.  Full family teaching was completed.  He was discharged to home with outpatient therapies as recommended.  DISCHARGE MEDICATIONS: 1. Aspirin 325 mg p.o. daily. 2. Plavix 75 mg p.o. daily. 3. Pepcid 20 mg p.o. daily. 4. Lisinopril 5 mg p.o. daily. 5. Lovaza 1 g p.o. b.i.d. 6. Percocet 5-325 one tablet every 6 hours as needed moderate pain,     dispensed of 60 tablets. 7. Paxil 20 mg p.o. daily.  DIET:  Regular.  SPECIAL INSTRUCTIONS:  The patient would follow up Dr. Claudette LawsAndrew Kirsteins at the outpatient rehab center February 14, 2014, Dr. Delia HeadyPramod [name], Neurology Services 1 month call for appointment, Dr. Lois HuxleyJames Davis [facility] for medical management.  Ongoing therapies were arranged as per [facility].  The patient was advised no driving.     Mariam Dollaraniel Angiulli, P.A.   ______________________________ Erick ColaceAndrew E. Kirsteins, M.D.    DA/MEDQ  D:  12/31/2013  T:  12/31/2013  Job:  [id number]  cc:   [name] P. Pearlean BrownieSethi, MD Carmin RichmondJames W. Davis, MD

## 2013-12-31 NOTE — Discharge Instructions (Signed)
Inpatient Rehab Discharge Instructions  Justin ChimesGeorge F Wells Discharge date and time: No discharge date for patient encounter.   Activities/Precautions/ Functional Status: Activity: activity as tolerated Diet: regular diet Wound Care: none needed Functional status:  ___ No restrictions     ___ Walk up steps independently ___ 24/7 supervision/assistance   ___ Walk up steps with assistance ___ Intermittent supervision/assistance  ___ Bathe/dress independently ___ Walk with walker     ___ Bathe/dress with assistance ___ Walk Independently    ___ Shower independently _x__ Walk with assistance    ___ Shower with assistance ___ No alcohol     ___ Return to work/school ________  Special Instruction No Driving   COMMUNITY REFERRALS UPON DISCHARGE:    Outpatient: PT  Agency:[facility]  Phone:[phone]            Date of Last Service:01/01/2014  Appointment Date/Time:MAY 4 Monday 10;30-11;30 AM  Medical Equipment/Items Ordered:ROLLING WALKER  Agency/Supplier: [facility]    [phone]   GENERAL COMMUNITY RESOURCES FOR PATIENT/FAMILY: Support Groups:[facility] SUPPORT GROUP  STROKE/TIA DISCHARGE INSTRUCTIONS SMOKING Cigarette smoking nearly doubles your risk of having a stroke & is the single most alterable risk factor  If you smoke or have smoked in the last 12 months, you are advised to quit smoking for your health.  Most of the excess cardiovascular risk related to smoking disappears within a year of stopping.  Ask you doctor about anti-smoking medications  [place] Quit Line: 1-800-QUIT NOW  Free Smoking Cessation Classes [phone]  CHOLESTEROL Know your levels; limit fat & cholesterol in your diet  Lipid Panel     Component Value Date/Time   CHOL 151 12/28/2013 0238   TRIG 131 12/28/2013 0238   HDL 40 12/28/2013 0238   CHOLHDL 3.8 12/28/2013 0238   VLDL 26 12/28/2013 0238   LDLCALC 85 12/28/2013 0238      Many patients benefit from treatment even if their cholesterol is at  goal.  Goal: Total Cholesterol (CHOL) less than 160  Goal:  Triglycerides (TRIG) less than 150  Goal:  HDL greater than 40  Goal:  LDL (LDLCALC) less than 100   BLOOD PRESSURE American Stroke Association blood pressure target is less that 120/80 mm/Hg  Your discharge blood pressure is:  BP: 136/73 mmHg  Monitor your blood pressure  Limit your salt and alcohol intake  Many individuals will require more than one medication for high blood pressure  DIABETES (A1c is a blood sugar average for last 3 months) Goal HGBA1c is under 7% (HBGA1c is blood sugar average for last 3 months)  Diabetes: No known diagnosis of diabetes    Lab Results  Component Value Date   HGBA1C 5.4 12/28/2013     Your HGBA1c can be lowered with medications, healthy diet, and exercise.  Check your blood sugar as directed by your physician  Call your physician if you experience unexplained or low blood sugars.  PHYSICAL ACTIVITY/REHABILITATION Goal is 30 minutes at least 4 days per week  Activity: Increase activity slowly, Therapies: Physical Therapy: Outpatient Return to work:   Activity decreases your risk of heart attack and stroke and makes your heart stronger.  It helps control your weight and blood pressure; helps you relax and can improve your mood.  Participate in a regular exercise program.  Talk with your doctor about the best form of exercise for you (dancing, walking, swimming, cycling).  DIET/WEIGHT Goal is to maintain a healthy weight  Your discharge diet is: General  liquids Your  height is:  Height: 5\' 10"  (177.8 cm) Your current weight is: Weight: 89.721 kg (197 lb 12.8 oz) Your Body Mass Index (BMI) is:  BMI (Calculated): 28.4  Following the type of diet specifically designed for you will help prevent another stroke.  Your goal weight range is:    Your goal Body Mass Index (BMI) is 19-24.  Healthy food habits can help reduce 3 risk factors for stroke:  High cholesterol, hypertension,  and excess weight.  RESOURCES Stroke/Support Group:  Call [phone]   STROKE EDUCATION PROVIDED/REVIEWED AND GIVEN TO PATIENT Stroke warning signs and symptoms How to activate emergency medical system (call 911). Medications prescribed at discharge. Need for follow-up after discharge. Personal risk factors for stroke. Pneumonia vaccine given:  Flu vaccine given:  My questions have been answered, the writing is legible, and I understand these instructions.  I will adhere to these goals & educational materials that have been provided to me after my discharge from the hospital.      My questions have been answered and I understand these instructions. I will adhere to these goals and the provided educational materials after my discharge from the hospital.  Patient/Caregiver Signature _______________________________ Date __________  Clinician Signature _______________________________________ Date __________  Please bring this form and your medication list with you to all your follow-up doctor's appointments.

## 2013-12-31 NOTE — Progress Notes (Signed)
Occupational Therapy Session Note  Patient Details  Name: Marella ChimesGeorge F Cheetham MRN: [id number] Date of Birth: [birth year]  Today's Date: 12/31/2013 Time: 1000-1100 Time Calculation (min): 60 min   Skilled Therapeutic Interventions/Progress Updates:      Pt seen for BADL retraining of toileting, bathing, and dressing with a focus on pt completing the tasks more independently with use of the RW. Pt only required S to walk to BR with RW otherwise he completed all of his self care with mod I.  Pt then ambulated to gym with RW with S.  Pt worked on [employer] counter standing balance activities to include heel raises, squats, and side lunges. Pt's wife was present at the end of the session for education.  Pt and wife are prepared for him to go home tomorrow.  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

## 2013-12-31 NOTE — Progress Notes (Signed)
Social Work Discharge Note Discharge Note  The overall goal for the admission was met for:   Discharge location: Yes-HOME WITH WIFE AND DAUGHTER-24 HR  Length of Stay: Yes-3 DAYS  Discharge activity level: Yes-SUPERVISION LEVEL  Home/community participation: Yes  Services provided included: MD, RD, PT, OT, SLP, RN, CM, TR, Pharmacy and SW  Financial Services: Medicare and Private Insurance: [place]  Follow-up services arranged: Outpatient: [facility] 5[facility]4 10;30-11;30 AM, DME: [facility]ROLL[facility]ALK[facility] and Patient/Family has no preference for HH/DME agencies Cancelled rolling walker due to has one at home Comments (or additional information):PT RECOVERED QUICKLY BUT ALSO WANTED TO GO HOME  Patient/Family verbalized understanding of follow-up arrangements: Yes  Individual responsible for coordination of the follow-up plan: [place]  Confirmed correct DME delivered: Elease Hashimoto 12/31/2013    Gardiner Rhyme Criss Bartles

## 2013-12-31 NOTE — Discharge Summary (Signed)
Discharge summary # [id number]

## 2013-12-31 NOTE — Progress Notes (Signed)
Occupational Therapy Discharge Summary  Patient Details  Name: SATCHEL HEIDINGER MRN: [id number] Date of Birth: Mar 20, 1951  Today's Date: 12/31/2013  Patient has met 3 of 3 long term goals due to improved activity tolerance, improved balance, postural control and ability to compensate for deficits.  Patient to discharge at overall Supervision level.  Patient's care partner is independent to provide the necessary physical assistance at discharge.    Reasons goals not met: n/a  Recommendation:  No further OT services recommended.  Equipment: No equipment provided  Reasons for discharge: treatment goals met  Patient/family agrees with progress made and goals achieved: Yes  OT Discharge ADL  mod I Vision/Perception  Vision- History Patient Visual Report: No change from baseline Vision- Assessment Visual Fields: No apparent deficits  Cognition Overall Cognitive Status: Within Functional Limits for tasks assessed Orientation Level: Oriented X4 Sensation Sensation Light Touch: Appears Intact;Impaired Detail Light Touch Impaired Details: Impaired RUE;Impaired RLE Stereognosis: Appears Intact Hot/Cold: Appears Intact Proprioception: Appears Intact Coordination Fine Motor Movements are Fluid and Coordinated: Yes Motor  Motor Motor: Hemiplegia Mobility    supervision with RW Trunk/Postural Assessment  Cervical Assessment Cervical Assessment: Exceptions to [facility] Cervical AROM Overall Cervical AROM Comments: limited in all directions and causing pain Thoracic AROM Overall Thoracic AROM Comments: limited rotation Lumbar AROM Overall Lumbar AROM Comments: limited rotation Postural Control Postural Control: Within Functional Limits  Balance Balance Balance Assessed: Yes Standardized Balance Assessment Standardized Balance Assessment: Berg Balance Test Berg Balance Test Sit to Stand: Able to stand without using hands and stabilize independently Standing Unsupported: Able to  stand safely 2 minutes Sitting with Back Unsupported but Feet Supported on Floor or Stool: Able to sit safely and securely 2 minutes Stand to Sit: Sits safely with minimal use of hands Transfers: Able to transfer safely, minor use of hands Standing Unsupported with Eyes Closed: Able to stand 10 seconds safely Standing Ubsupported with Feet Together: Able to place feet together independently and stand 1 minute safely From Standing, Reach Forward with Outstretched Arm: Can reach forward >12 cm safely (5") From Standing Position, Pick up Object from Floor: Able to pick up shoe, needs supervision From Standing Position, Turn to Look Behind Over each Shoulder: Looks behind one side only/other side shows less weight shift Turn 360 Degrees: Able to turn 360 degrees safely but slowly Standing Unsupported, Alternately Place Feet on Step/Stool: Able to complete >2 steps/needs minimal assist Standing Unsupported, One Foot in Front: Able to place foot tandem independently and hold 30 seconds Standing on One Leg: Able to lift leg independently and hold > 10 seconds Total Score: 48 Static Sitting Balance Static Sitting - Level of Assistance: 7: Independent Dynamic Sitting Balance Dynamic Sitting - Level of Assistance: 7: Independent Static Standing Balance Static Standing - Level of Assistance: 6: Modified independent (Device/Increase time) Dynamic Standing Balance Dynamic Standing - Level of Assistance: 5: Stand by assistance Extremity/Trunk Assessment RUE Assessment RUE Assessment: Within Functional Limits LUE Assessment LUE Assessment: Within Functional Limits  See FIM for current functional status  Harlene Ramus 12/31/2013, 12:09 PM

## 2013-12-31 NOTE — Progress Notes (Signed)
Subjective/Complaints:  Team notes reviewed, discussed excellent progress with pt  Review of Systems - Negative except anxiety  Objective: Vital Signs: Blood pressure 117/73, pulse 62, temperature 97.9 F (36.6 C), temperature source Oral, resp. rate 18, height _0  (1.778 m), weight 89.721 kg (197 lb 12.8 oz), SpO2 96.00%. No results found. Results for orders placed during the hospital encounter of 12/29/13 (from the past 72 hour(s))  CBC WITH DIFFERENTIAL     Status: Abnormal   Collection Time    12/30/13  8:26 AM      Result Value Ref Range   WBC 5.9  4.0 - 10.5 K/uL   RBC 5.01  4.22 - 5.81 MIL/uL   Hemoglobin 15.1  13.0 - 17.0 g/dL   HCT 44.4  39.0 - 52.0 %   MCV 88.6  78.0 - 100.0 fL   MCH 30.1  26.0 - 34.0 pg   MCHC 34.0  30.0 - 36.0 g/dL   RDW 15.0  11.5 - 15.5 %   Platelets 184  150 - 400 K/uL   Neutrophils Relative % 64  43 - 77 %   Neutro Abs 3.8  1.7 - 7.7 K/uL   Lymphocytes Relative 19  12 - 46 %   Lymphs Abs 1.1  0.7 - 4.0 K/uL   Monocytes Relative 7  3 - 12 %   Monocytes Absolute 0.4  0.1 - 1.0 K/uL   Eosinophils Relative 9 (*) 0 - 5 %   Eosinophils Absolute 0.5  0.0 - 0.7 K/uL   Basophils Relative 1  0 - 1 %   Basophils Absolute 0.1  0.0 - 0.1 K/uL  COMPREHENSIVE METABOLIC PANEL     Status: Abnormal   Collection Time    12/30/13  8:26 AM      Result Value Ref Range   Sodium 136 (*) 137 - 147 mEq/L   Potassium 4.7  3.7 - 5.3 mEq/L   Chloride 96  96 - 112 mEq/L   CO2 22  19 - 32 mEq/L   Glucose, Bld 267 (*) 70 - 99 mg/dL   BUN 12  6 - 23 mg/dL   Creatinine, Ser 0.96  0.50 - 1.35 mg/dL   Calcium 9.1  8.4 - 10.5 mg/dL   Total Protein 7.2  6.0 - 8.3 g/dL   Albumin 3.8  3.5 - 5.2 g/dL   AST 27  0 - 37 U/L   ALT 19  0 - 53 U/L   Alkaline Phosphatase 87  39 - 117 U/L   Total Bilirubin 0.6  0.3 - 1.2 mg/dL   GFR calc non Af Amer 88 (*) >90 mL/min   GFR calc Af Amer >90  >90 mL/min   Comment: (NOTE)     The eGFR has been calculated using the CKD EPI  equation.     This calculation has not been validated in all clinical situations.     eGFR's persistently <90 mL/min signify possible Chronic Kidney     Disease.       General: No acute distress Mood and affect are appropriate Heart: Regular rate and rhythm no rubs murmurs or extra sounds Lungs: Clear to auscultation, breathing unlabored, no rales or wheezes Abdomen: Positive bowel sounds, soft nontender to palpation, nondistended Extremities: No clubbing, cyanosis, or edema Skin: No evidence of breakdown, no evidence of rash Neurologic: Cranial nerves II through XII intact, motor strength is 5/5 in Leftdeltoid, bicep, tricep, grip, hip flexor, knee extensors, ankle dorsiflexor and plantar flexor, 3+/5  RUE, 4/5 R LE Slowed finger to thumb opposition on R but able to touch all digits to Thumb  Sensory exam normal sensation to light touch and proprioception in bilateral upper and lower extremities Cerebellar exam normal finger to nose to finger as well as heel to shin in bilateral upper and lower extremities Musculoskeletal: Full range of motion in all 4 extremities. No joint swelling  Assessment/Plan: 1. Functional deficits secondary to Left subcortical infarct which require 3+ hours per day of interdisciplinary therapy in a comprehensive inpatient rehab setting. Physiatrist is providing close team supervision and 24 hour management of active medical problems listed below. Physiatrist and rehab team continue to assess barriers to discharge/monitor patient progress toward functional and medical goals. Plan D/C in am  Instructions today FIM: FIM - Bathing Bathing Steps Patient Completed: Chest;Right Arm;Left Arm;Abdomen;Left upper leg;Right upper leg;Buttocks;Left lower leg (including foot);Right lower leg (including foot);Front perineal area Bathing: 6: More than reasonable amount of time  FIM - Upper Body Dressing/Undressing Upper body dressing/undressing steps patient completed:  Thread/unthread right sleeve of pullover shirt/dresss;Thread/unthread left sleeve of pullover shirt/dress;Put head through opening of pull over shirt/dress;Pull shirt over trunk Upper body dressing/undressing: 7: Complete Independence: No helper FIM - Lower Body Dressing/Undressing Lower body dressing/undressing steps patient completed: Thread/unthread right underwear leg;Thread/unthread left underwear leg;Pull underwear up/down;Thread/unthread right pants leg;Thread/unthread left pants leg;Pull pants up/down;Don/Doff right sock;Don/Doff left sock;Don/Doff right shoe;Don/Doff left shoe;Fasten/unfasten right shoe;Fasten/unfasten left shoe Lower body dressing/undressing: 6: More than reasonable amount of time  FIM - Toileting Toileting steps completed by patient: Adjust clothing prior to toileting;Performs perineal hygiene;Adjust clothing after toileting Toileting Assistive Devices: Grab bar or rail for support Toileting: 6: More than reasonable amount of time  FIM - Air cabin crew Transfers: 4-From toilet/BSC: Min A (steadying Pt. > 75%);4-To toilet/BSC: Min A (steadying Pt. > 75%)  FIM - Bed/Chair Transfer Bed/Chair Transfer: 4: Bed > Chair or W/C: Min A (steadying Pt. > 75%)  FIM - Locomotion: Wheelchair Distance: 25 Locomotion: Wheelchair: 1: Travels less than 50 ft with minimal assistance (Pt.>75%) FIM - Locomotion: Ambulation Locomotion: Ambulation Assistive Devices: Other (comment) (none) Ambulation/Gait Assistance: 4: Min assist Locomotion: Ambulation: 2: Travels 50 - 149 ft with minimal assistance (Pt.>75%)  Comprehension Comprehension Mode: Auditory Comprehension: 6-Follows complex conversation/direction: With extra time/assistive device  Expression Expression Mode: Verbal Expression: 5-Expresses complex 90% of the time/cues < 10% of the time  Social Interaction Social Interaction: 6-Interacts appropriately with others with medication or extra time (anti-anxiety,  antidepressant).  Problem Solving Problem Solving: 6-Solves complex problems: With extra time  Memory Memory: 7-Complete Independence: No helper  Medical Problem List and Plan:  1. 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 DVT Prophylaxis/Anticoagulation: SCDs. Monitor for any signs of DVT  3. Pain Management: Oxycodone as needed.  4. Mood/depression. Paxil 20 mg daily. Provide emotional support  5. Neuropsych: This patient is capable of making decisions on his own behalf.  6. Hypertension. Lisinopril 5 mg daily. Monitor with increased mobility  7. CAD status post stenting x3. Continue aspirin Plavix therapy. No chest pain and shortness of breath. Cardiac enzymes negative.  8. Hyperlipidemia. Lovaza  9. Resolving bronchitis: robitussin prn, IS   LOS (Days) 2 A FACE TO FACE EVALUATION WAS PERFORMED  Charlett Blake 12/31/2013, 6:56 AM

## 2014-01-01 DIAGNOSIS — I6330 Cerebral infarction due to thrombosis of unspecified cerebral artery: Secondary | ICD-10-CM

## 2014-01-01 NOTE — Progress Notes (Signed)
Subjective/Complaints:  Pt received instruction yest pm, good night , no c/os  Review of Systems - Negative except anxiety  Objective: Vital Signs: Blood pressure 111/65, pulse 66, temperature 98.1 F (36.7 C), temperature source Oral, resp. rate 18, height '5\' 10"'  (1.778 m), weight 89.721 kg (197 lb 12.8 oz), SpO2 97.00%. No results found. Results for orders placed during the hospital encounter of 12/29/13 (from the past 72 hour(s))  CBC WITH DIFFERENTIAL     Status: Abnormal   Collection Time    12/30/13  8:26 AM      Result Value Ref Range   WBC 5.9  4.0 - 10.5 K/uL   RBC 5.01  4.22 - 5.81 MIL/uL   Hemoglobin 15.1  13.0 - 17.0 g/dL   HCT 44.4  39.0 - 52.0 %   MCV 88.6  78.0 - 100.0 fL   MCH 30.1  26.0 - 34.0 pg   MCHC 34.0  30.0 - 36.0 g/dL   RDW 15.0  11.5 - 15.5 %   Platelets 184  150 - 400 K/uL   Neutrophils Relative % 64  43 - 77 %   Neutro Abs 3.8  1.7 - 7.7 K/uL   Lymphocytes Relative 19  12 - 46 %   Lymphs Abs 1.1  0.7 - 4.0 K/uL   Monocytes Relative 7  3 - 12 %   Monocytes Absolute 0.4  0.1 - 1.0 K/uL   Eosinophils Relative 9 (*) 0 - 5 %   Eosinophils Absolute 0.5  0.0 - 0.7 K/uL   Basophils Relative 1  0 - 1 %   Basophils Absolute 0.1  0.0 - 0.1 K/uL  COMPREHENSIVE METABOLIC PANEL     Status: Abnormal   Collection Time    12/30/13  8:26 AM      Result Value Ref Range   Sodium 136 (*) 137 - 147 mEq/L   Potassium 4.7  3.7 - 5.3 mEq/L   Chloride 96  96 - 112 mEq/L   CO2 22  19 - 32 mEq/L   Glucose, Bld 267 (*) 70 - 99 mg/dL   BUN 12  6 - 23 mg/dL   Creatinine, Ser 0.96  0.50 - 1.35 mg/dL   Calcium 9.1  8.4 - 10.5 mg/dL   Total Protein 7.2  6.0 - 8.3 g/dL   Albumin 3.8  3.5 - 5.2 g/dL   AST 27  0 - 37 U/L   ALT 19  0 - 53 U/L   Alkaline Phosphatase 87  39 - 117 U/L   Total Bilirubin 0.6  0.3 - 1.2 mg/dL   GFR calc non Af Amer 88 (*) >90 mL/min   GFR calc Af Amer >90  >90 mL/min   Comment: (NOTE)     The eGFR has been calculated using the CKD EPI  equation.     This calculation has not been validated in all clinical situations.     eGFR's persistently <90 mL/min signify possible Chronic Kidney     Disease.       General: No acute distress Mood and affect are appropriate Heart: Regular rate and rhythm no rubs murmurs or extra sounds Lungs: Clear to auscultation, breathing unlabored, no rales or wheezes Abdomen: Positive bowel sounds, soft nontender to palpation, nondistended Extremities: No clubbing, cyanosis, or edema Skin: No evidence of breakdown, no evidence of rash Neurologic: Cranial nerves II through XII intact, motor strength is 5/5 in Leftdeltoid, bicep, tricep, grip, hip flexor, knee extensors, ankle dorsiflexor and plantar  flexor, 3+/5 RUE, 4/5 R LE Slowed finger to thumb opposition on R but able to touch all digits to Thumb  Sensory exam normal sensation to light touch and proprioception in bilateral upper and lower extremities Cerebellar exam normal finger to nose to finger as well as heel to shin in bilateral upper and lower extremities Musculoskeletal: Full range of motion in all 4 extremities. No joint swelling  Assessment/Plan: 1. Functional deficits secondary to Left subcortical infarct which require 3+ hours per day of interdisciplinary therapy in a comprehensive inpatient rehab setting. Stable for D/C today F/u PCP in 1-2 weeks F/u PM&R 3 weeks See D/C summary See D/C instructions FIM: FIM - Bathing Bathing Steps Patient Completed: Chest;Right Arm;Left Arm;Abdomen;Left upper leg;Right upper leg;Buttocks;Left lower leg (including foot);Right lower leg (including foot);Front perineal area Bathing: 6: More than reasonable amount of time  FIM - Upper Body Dressing/Undressing Upper body dressing/undressing steps patient completed: Thread/unthread right sleeve of pullover shirt/dresss;Thread/unthread left sleeve of pullover shirt/dress;Put head through opening of pull over shirt/dress;Pull shirt over trunk Upper  body dressing/undressing: 7: Complete Independence: No helper FIM - Lower Body Dressing/Undressing Lower body dressing/undressing steps patient completed: Thread/unthread right underwear leg;Thread/unthread left underwear leg;Pull underwear up/down;Thread/unthread right pants leg;Thread/unthread left pants leg;Pull pants up/down;Don/Doff right sock;Don/Doff left sock;Don/Doff right shoe;Don/Doff left shoe;Fasten/unfasten right shoe;Fasten/unfasten left shoe Lower body dressing/undressing: 6: More than reasonable amount of time  FIM - Toileting Toileting steps completed by patient: Adjust clothing prior to toileting;Performs perineal hygiene;Adjust clothing after toileting Toileting Assistive Devices: Grab bar or rail for support Toileting: 6: More than reasonable amount of time  FIM - Radio producer Devices: Insurance account manager Transfers: 5-To toilet/BSC: Supervision (verbal cues/safety issues);6-More than reasonable amt of time  FIM - Control and instrumentation engineer Devices: Copy: 5: Bed > Chair or W/C: Supervision (verbal cues/safety issues);6: More than reasonable amt of time  FIM - Locomotion: Wheelchair Distance: 150 Locomotion: Wheelchair: 5: Travels 150 ft or more: maneuvers on rugs and over door sills with supervision, cueing or coaxing FIM - Locomotion: Ambulation Locomotion: Ambulation Assistive Devices: Environmental consultant - Rolling;Other (comment) (or none) Ambulation/Gait Assistance: 5: Supervision Locomotion: Ambulation: 5: Travels 150 ft or more with supervision/safety issues  Comprehension Comprehension Mode: Auditory Comprehension: 7-Follows complex conversation/direction: With no assist  Expression Expression Mode: Verbal Expression: 7-Expresses complex ideas: With no assist  Social Interaction Social Interaction: 6-Interacts appropriately with others with medication or extra time (anti-anxiety,  antidepressant).  Problem Solving Problem Solving: 7-Solves complex problems: Recognizes & self-corrects  Memory Memory: 7-Complete Independence: No helper  Medical Problem List and Plan:  1. 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 DVT Prophylaxis/Anticoagulation: SCDs. Monitor for any signs of DVT  3. Pain Management: Oxycodone as needed.  4. Mood/depression. Paxil 20 mg daily. Provide emotional support  5. Neuropsych: This patient is capable of making decisions on his own behalf.  6. Hypertension. Lisinopril 5 mg daily. Monitor with increased mobility  7. CAD status post stenting x3. Continue aspirin Plavix therapy. No chest pain and shortness of breath. Cardiac enzymes negative.  8. Hyperlipidemia. Lovaza  9. Resolving bronchitis: robitussin prn, IS   LOS (Days) 3 A FACE TO FACE EVALUATION WAS PERFORMED  Charlett Blake 01/01/2014, 6:42 AM

## 2014-01-01 NOTE — Progress Notes (Signed)
Pt. Got d/c papers and instructions,pt. Ready to d/c home with wife.

## 2014-01-06 DIAGNOSIS — G939 Disorder of brain, unspecified: Secondary | ICD-10-CM | POA: Insufficient documentation

## 2014-01-06 DIAGNOSIS — I639 Cerebral infarction, unspecified: Secondary | ICD-10-CM | POA: Insufficient documentation

## 2014-01-06 DIAGNOSIS — I6782 Cerebral ischemia: Secondary | ICD-10-CM | POA: Insufficient documentation

## 2014-01-06 DIAGNOSIS — R202 Paresthesia of skin: Secondary | ICD-10-CM | POA: Insufficient documentation

## 2014-01-14 ENCOUNTER — Emergency Department (HOSPITAL_COMMUNITY): Payer: BC Managed Care – PPO

## 2014-01-14 ENCOUNTER — Emergency Department (HOSPITAL_COMMUNITY)
Admission: EM | Admit: 2014-01-14 | Discharge: 2014-01-14 | Disposition: A | Discharge status: Home / Self Care | Payer: BC Managed Care – PPO | Attending: Emergency Medicine | Admitting: Emergency Medicine

## 2014-01-14 DIAGNOSIS — G43909 Migraine, unspecified, not intractable, without status migrainosus: Secondary | ICD-10-CM | POA: Diagnosis not present

## 2014-01-14 DIAGNOSIS — Z8673 Personal history of transient ischemic attack (TIA), and cerebral infarction without residual deficits: Secondary | ICD-10-CM | POA: Diagnosis not present

## 2014-01-14 DIAGNOSIS — Z87891 Personal history of nicotine dependence: Secondary | ICD-10-CM | POA: Insufficient documentation

## 2014-01-14 DIAGNOSIS — E785 Hyperlipidemia, unspecified: Secondary | ICD-10-CM | POA: Diagnosis not present

## 2014-01-14 DIAGNOSIS — Z9884 Bariatric surgery status: Secondary | ICD-10-CM | POA: Insufficient documentation

## 2014-01-14 DIAGNOSIS — I2510 Atherosclerotic heart disease of native coronary artery without angina pectoris: Secondary | ICD-10-CM | POA: Diagnosis not present

## 2014-01-14 DIAGNOSIS — K219 Gastro-esophageal reflux disease without esophagitis: Secondary | ICD-10-CM | POA: Insufficient documentation

## 2014-01-14 DIAGNOSIS — Z9889 Other specified postprocedural states: Secondary | ICD-10-CM | POA: Diagnosis not present

## 2014-01-14 DIAGNOSIS — I252 Old myocardial infarction: Secondary | ICD-10-CM | POA: Diagnosis not present

## 2014-01-14 DIAGNOSIS — R200 Anesthesia of skin: Secondary | ICD-10-CM

## 2014-01-14 DIAGNOSIS — M19049 Primary osteoarthritis, unspecified hand: Secondary | ICD-10-CM | POA: Insufficient documentation

## 2014-01-14 DIAGNOSIS — Z7982 Long term (current) use of aspirin: Secondary | ICD-10-CM | POA: Diagnosis not present

## 2014-01-14 DIAGNOSIS — Z9861 Coronary angioplasty status: Secondary | ICD-10-CM | POA: Diagnosis not present

## 2014-01-14 DIAGNOSIS — R209 Unspecified disturbances of skin sensation: Secondary | ICD-10-CM

## 2014-01-14 DIAGNOSIS — M6281 Muscle weakness (generalized): Secondary | ICD-10-CM | POA: Diagnosis not present

## 2014-01-14 DIAGNOSIS — Z7902 Long term (current) use of antithrombotics/antiplatelets: Secondary | ICD-10-CM | POA: Diagnosis not present

## 2014-01-14 DIAGNOSIS — Z79899 Other long term (current) drug therapy: Secondary | ICD-10-CM | POA: Diagnosis not present

## 2014-01-14 DIAGNOSIS — R202 Paresthesia of skin: Secondary | ICD-10-CM

## 2014-01-14 DIAGNOSIS — I10 Essential (primary) hypertension: Secondary | ICD-10-CM | POA: Diagnosis not present

## 2014-01-14 DIAGNOSIS — H538 Other visual disturbances: Secondary | ICD-10-CM | POA: Diagnosis present

## 2014-01-14 LAB — URINALYSIS, ROUTINE W REFLEX MICROSCOPIC
Bilirubin Urine: NEGATIVE
Glucose, UA: NEGATIVE mg/dL
Hgb urine dipstick: NEGATIVE
Ketones, ur: NEGATIVE mg/dL
LEUKOCYTES UA: NEGATIVE
Nitrite: NEGATIVE
PH: 6 (ref 5.0–8.0)
Protein, ur: NEGATIVE mg/dL
Specific Gravity, Urine: 1.01 (ref 1.005–1.030)
Urobilinogen, UA: 1 mg/dL (ref 0.0–1.0)

## 2014-01-14 LAB — CBG MONITORING, ED: Glucose-Capillary: 88 mg/dL (ref 70–99)

## 2014-01-14 LAB — I-STAT CHEM 8, ED
BUN: 9 mg/dL (ref 6–23)
CHLORIDE: 101 meq/L (ref 96–112)
CREATININE: 1.3 mg/dL (ref 0.50–1.35)
Calcium, Ion: 1.15 mmol/L (ref 1.13–1.30)
GLUCOSE: 85 mg/dL (ref 70–99)
HCT: 42 % (ref 39.0–52.0)
Hemoglobin: 14.3 g/dL (ref 13.0–17.0)
POTASSIUM: 3.9 meq/L (ref 3.7–5.3)
Sodium: 137 mEq/L (ref 137–147)
TCO2: 25 mmol/L (ref 0–100)

## 2014-01-14 LAB — DIFFERENTIAL
Basophils Absolute: 0.1 10*3/uL (ref 0.0–0.1)
Basophils Relative: 1 % (ref 0–1)
EOS PCT: 4 % (ref 0–5)
Eosinophils Absolute: 0.3 10*3/uL (ref 0.0–0.7)
LYMPHS ABS: 1.2 10*3/uL (ref 0.7–4.0)
LYMPHS PCT: 15 % (ref 12–46)
MONO ABS: 0.8 10*3/uL (ref 0.1–1.0)
MONOS PCT: 10 % (ref 3–12)
Neutro Abs: 5.7 10*3/uL (ref 1.7–7.7)
Neutrophils Relative %: 70 % (ref 43–77)

## 2014-01-14 LAB — CBC
HEMATOCRIT: 40.8 % (ref 39.0–52.0)
HEMOGLOBIN: 14.2 g/dL (ref 13.0–17.0)
MCH: 29.9 pg (ref 26.0–34.0)
MCHC: 34.8 g/dL (ref 30.0–36.0)
MCV: 85.9 fL (ref 78.0–100.0)
PLATELETS: 268 10*3/uL (ref 150–400)
RBC: 4.75 MIL/uL (ref 4.22–5.81)
RDW: 14 % (ref 11.5–15.5)
WBC: 8.1 10*3/uL (ref 4.0–10.5)

## 2014-01-14 LAB — COMPREHENSIVE METABOLIC PANEL
ALT: 16 U/L (ref 0–53)
AST: 21 U/L (ref 0–37)
Albumin: 4 g/dL (ref 3.5–5.2)
Alkaline Phosphatase: 87 U/L (ref 39–117)
BUN: 10 mg/dL (ref 6–23)
CALCIUM: 9.4 mg/dL (ref 8.4–10.5)
CO2: 25 meq/L (ref 19–32)
Chloride: 100 mEq/L (ref 96–112)
Creatinine, Ser: 1.14 mg/dL (ref 0.50–1.35)
GFR, EST AFRICAN AMERICAN: 78 mL/min — AB (ref >90)
GFR, EST NON AFRICAN AMERICAN: 68 mL/min — AB (ref >90)
GLUCOSE: 84 mg/dL (ref 70–99)
Potassium: 4.1 mEq/L (ref 3.7–5.3)
Sodium: 139 mEq/L (ref 137–147)
Total Bilirubin: 0.4 mg/dL (ref 0.3–1.2)
Total Protein: 7.4 g/dL (ref 6.0–8.3)

## 2014-01-14 LAB — I-STAT TROPONIN, ED: Troponin i, poc: 0 ng/mL (ref 0.00–0.08)

## 2014-01-14 LAB — RAPID URINE DRUG SCREEN, HOSP PERFORMED
Amphetamines: NOT DETECTED
BENZODIAZEPINES: NOT DETECTED
Barbiturates: NOT DETECTED
Cocaine: NOT DETECTED
Opiates: NOT DETECTED
Tetrahydrocannabinol: NOT DETECTED

## 2014-01-14 LAB — PROTIME-INR
INR: 1.01 (ref 0.00–1.49)
Prothrombin Time: 13.1 seconds (ref 11.6–15.2)

## 2014-01-14 LAB — ETHANOL: Alcohol, Ethyl (B): <11 mg/dL (ref 0–11)

## 2014-01-14 LAB — APTT: aPTT: 30 seconds (ref 24–37)

## 2014-01-14 MED ORDER — DIPHENHYDRAMINE HCL 50 MG/ML IJ SOLN
25.0000 mg | Freq: Once | INTRAMUSCULAR | Status: AC
  Administered 2014-01-14: 25 mg via INTRAVENOUS
  Filled 2014-01-14: qty 1

## 2014-01-14 MED ORDER — PROCHLORPERAZINE EDISYLATE 5 MG/ML IJ SOLN
10.0000 mg | Freq: Once | INTRAMUSCULAR | Status: AC
  Administered 2014-01-14: 10 mg via INTRAVENOUS
  Filled 2014-01-14: qty 2

## 2014-01-14 MED ORDER — GABAPENTIN 100 MG PO CAPS: 100.0000 mg | ORAL_CAPSULE | Freq: Three times a day (TID) | ORAL | Status: DC

## 2014-01-14 NOTE — ED Notes (Signed)
Pt arrives to MRI.

## 2014-01-14 NOTE — ED Notes (Signed)
Dr. Amada JupiterKirkpatrick states to have pt taken to MRI right after CT scan, MRI called and notified.

## 2014-01-14 NOTE — Consult Note (Signed)
Referring Physician: Jacubowitz    Chief Complaint: HA, blurred vision, neck pain and bilateral leg weakness.   HPI:                                                                                                                                         Justin Wells is an 63 y.o. male who was recently seen at [facility] for acute right sided weakness status post IV t-PA 12/27/2013 at 1326.  Imaging at that time did not confirm acute infarct. HE was believed to have a left brain posterior circulation small infarct not visualized on MRI. Patient returns to hospital after noting a sudden onset of posterior neck discomfort, vertigo, blurred vision and bilateral LE weakness. [facility]. On arrival patient states he does have history of migraine HA which are very similar in nature with HA, blurred vision and bilateral LE weakness. Initial CT head was negative.  Patient was then transported to MRI.   MRI was negative for acute infarct    Date last known well: Date: 01/14/2014 Time last known well: Time: 11:15 tPA Given: No: recent stroke  Past Medical History  Diagnosis Date  . Peripheral arterial disease   . Hyperlipidemia   . Coronary artery disease   . Hypertension   . Barrett's esophagus   . Gastroesophageal reflux   . Cerebrovascular disease   . Myocardial infarction 2009    "during cardiac cath" (01/27/2013)  . H/O hiatal hernia   . NWGNFAOZ(308.6Headache(784.0)     "probably monthly" (01/27/2013)  . Seizures     "when I had my heart attack in 2009" (01/27/2013)  . 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 Arthritis     "hands" (01/27/2013)    Past Surgical History  Procedure Laterality Date  . Laparoscopic gastric banding  2010  . Cardiac catheterization    . Coronary angioplasty with stent placement  11/02/2007    "2"  (01/27/2013)  . Laparoscopic nissen fundoplication  ?2001  . Femoral artery stent Right 01/26/2013  . Diagnostic laparoscopy      "twice after nissen; it kept coming apart " (01/27/2013)    Family History  Problem Relation Age of Onset  . Heart disease Mother     Heart Disease before age 61  . Hypertension Mother   . Heart attack Mother   . Heart attack Father    Social History:  reports that he quit smoking about 12 years ago. His smoking use included Cigarettes. He has a 70 pack-year smoking history. He has never used smokeless tobacco. He reports that he does not drink alcohol or use illicit drugs.  Allergies: No Known Allergies  Medications:  No current facility-administered medications for this encounter.   Current Outpatient Prescriptions  Medication Sig Dispense Refill  . aspirin EC 81 MG tablet Take 81 mg by mouth daily.      . clopidogrel (PLAVIX) 75 MG tablet Take 1 tablet (75 mg total) by mouth daily.  90 tablet  3  . fish oil-omega-3 fatty acids 1000 MG capsule Take 1 g by mouth 2 (two) times daily.       Marland Kitchen lisinopril (PRINIVIL,ZESTRIL) 5 MG tablet Take 1 tablet (5 mg total) by mouth daily.  30 tablet  1  . Multiple Vitamins-Minerals (ONE-A-DAY MENS VITACRAVES PO) Take by mouth daily.      . nitroGLYCERIN (NITROSTAT) 0.4 MG SL tablet Place 0.4 mg under the tongue every 5 (five) minutes as needed for chest pain.      Marland Kitchen oxyCODONE-acetaminophen (PERCOCET/ROXICET) 5-325 MG per tablet Take 1 tablet by mouth every 6 (six) hours as needed for moderate pain.      Marland Kitchen PARoxetine (PAXIL) 20 MG tablet Take 1 tablet (20 mg total) by mouth every morning.  30 tablet  1  . ranitidine (ZANTAC) 150 MG tablet Take 1 tablet (150 mg total) by mouth 2 (two) times daily.  60 tablet  1     ROS:                                                                                                                                        History obtained from the patient  General ROS: negative for - chills, fatigue, fever, night sweats, weight gain or weight loss Psychological ROS: negative for - behavioral disorder, hallucinations, memory difficulties, mood swings or suicidal ideation Ophthalmic ROS: negative for - blurry vision, double vision, eye pain or loss of vision ENT ROS: negative for - epistaxis, nasal discharge, oral lesions, sore throat, tinnitus or vertigo Allergy and Immunology ROS: negative for - hives or itchy/watery eyes Hematological and Lymphatic ROS: negative for - bleeding problems, bruising or swollen lymph nodes Endocrine ROS: negative for - galactorrhea, hair pattern changes, polydipsia/polyuria or temperature intolerance Respiratory ROS: negative for - cough, hemoptysis, shortness of breath or wheezing Cardiovascular ROS: negative for - chest pain, dyspnea on exertion, edema or irregular heartbeat Gastrointestinal ROS: negative for - abdominal pain, diarrhea, hematemesis, nausea/vomiting or stool incontinence Genito-Urinary ROS: negative for - dysuria, hematuria, incontinence or urinary frequency/urgency Musculoskeletal ROS: negative for - joint swelling or muscular weakness Neurological ROS: as noted in HPI Dermatological ROS: negative for rash and skin lesion changes  Neurologic Examination:  Blood pressure 141/76, pulse 67, resp. rate 16, SpO2 97.00%.  Mental Status: Alert, oriented, thought content appropriate.  Speech fluent without evidence of aphasia.  Able to follow 3 step commands without difficulty. Cranial Nerves: II: Discs flat bilaterally; Visual fields grossly normal, pupils equal, round, reactive to light and accommodation III,IV, VI: ptosis not present, extra-ocular motions intact bilaterally V,VII: smile symmetric with right facial  droop at rest, facial light touch sensation decreased on the right lower face VIII: hearing normal bilaterally IX,X: gag reflex present XI: bilateral shoulder shrug XII: midline tongue extension without atrophy or fasciculations  Motor: Right : Upper extremity   4/5 -give way   Left:     Upper extremity   5/5  Lower extremity   4/5 -give way    Lower extremity   5/5 --(+) hoover Tone and bulk:normal tone throughout; no atrophy noted Sensory: Pinprick and light touch decreased on the right arm and leg Deep Tendon Reflexes:  Right: Upper Extremity   Left: Upper extremity   biceps (C-5 to C-6) 2/4   biceps (C-5 to C-6) 2/4 tricep (C7) 2/4    triceps (C7) 2/4 Brachioradialis (C6) 2/4  Brachioradialis (C6) 2/4  Lower Extremity Lower Extremity  quadriceps (L-2 to L-4) 2/4   quadriceps (L-2 to L-4) 2/4 Achilles (S1) 2/4   Achilles (S1) 2/4  Plantars: Right: downgoing   Left: downgoing Cerebellar: normal finger-to-nose,  normal heel-to-shin test Gait: not tested CV: pulses palpable throughout    Lab Results: Basic Metabolic Panel:  Recent Labs Lab 01/14/14 1223 01/14/14 1231  NA 139 137  K 4.1 3.9  CL 100 101  CO2 25  --   GLUCOSE 84 85  BUN 10 9  CREATININE 1.14 1.30  CALCIUM 9.4  --     Liver Function Tests:  Recent Labs Lab 01/14/14 1223  AST 21  ALT 16  ALKPHOS 87  BILITOT 0.4  PROT 7.4  ALBUMIN 4.0   No results found for this basename: LIPASE, AMYLASE,  in the last 168 hours No results found for this basename: AMMONIA,  in the last 168 hours  CBC:  Recent Labs Lab 01/14/14 1223 01/14/14 1231  WBC 8.1  --   NEUTROABS 5.7  --   HGB 14.2 14.3  HCT 40.8 42.0  MCV 85.9  --   PLT 268  --     Cardiac Enzymes: No results found for this basename: CKTOTAL, CKMB, CKMBINDEX, TROPONINI,  in the last 168 hours  Lipid Panel: No results found for this basename: CHOL, TRIG, HDL, CHOLHDL, VLDL, LDLCALC,  in the last 168 hours  CBG:  Recent Labs Lab  01/14/14 1333  GLUCAP 88    Microbiology: Results for orders placed during the hospital encounter of 12/27/13  MRSA PCR SCREENING     Status: None   Collection Time    12/27/13  6:32 PM      Result Value Ref Range Status   MRSA by PCR NEGATIVE  NEGATIVE Final   Comment:            The GeneXpert MRSA Assay (FDA     approved for NASAL specimens     only), is one component of a     comprehensive MRSA colonization     surveillance program. It is not     intended to diagnose MRSA     infection nor to guide or     monitor treatment for     MRSA infections.    Coagulation  Studies:  Recent Labs  01/14/14 1223  LABPROT 13.1  INR 1.01    Imaging: Ct Head Wo Contrast  01/14/2014   CLINICAL DATA:  Bilateral leg weakness, code stroke  EXAM: CT HEAD WITHOUT CONTRAST  TECHNIQUE: Contiguous axial images were obtained from the base of the skull through the vertex without intravenous contrast.  COMPARISON:  Head CT - 12/27/2013; brain MRI- 12/27/2013 CTA head and neck -12/20/2013  FINDINGS: Gray-white differentiation is maintained. No CT evidence of acute large territory infarct. No intraparenchymal or extra-axial mass or hemorrhage. Unchanged size and configuration of the ventricles and basilar cisterns. No midline shift. There is rather extensive polypoid mucosal thickening within the bilateral maxillary sinuses and to a lesser extent, the bilateral frontal sinuses and bilateral anterior ethmoidal air cells. No discrete air-fluid levels. The mastoid air cells are normally aerated. Intracranial atherosclerosis. Regional soft tissues appear normal. No displaced calvarial fracture.  IMPRESSION: 1. No acute intracranial process. 2. Sinus disease as above.  No air-fluid levels.  Critical Value/emergent results were called by telephone at the time of interpretation on 01/14/2014 at 12:38 PM to Dr. Amada Jupiter, who verbally acknowledged these results.   Electronically Signed   By: Simonne Come M.D.   On:  01/14/2014 12:42   Mr Brain Wo Contrast  01/14/2014   CLINICAL DATA:  Right-sided weakness.  Dizziness and slurred speech  EXAM: MRI HEAD WITHOUT CONTRAST  MRI CERVICAL SPINE WITHOUT CONTRAST  TECHNIQUE: Multiplanar, multiecho pulse sequences of the brain and surrounding structures, and cervical spine, to include the craniocervical junction and cervicothoracic junction, were obtained without intravenous contrast.  COMPARISON:  CT head 01/14/2014, MRI head 12/27/2013  FINDINGS: MRI HEAD FINDINGS  Limited sequences of the head per Dr. Amada Jupiter.  Negative for acute infarct. Scattered small white matter hyperintensities bilaterally consistent with mild chronic microvascular ischemia. Ventricle size is normal.  Mucosal edema throughout the paranasal sinuses.  MRI CERVICAL SPINE FINDINGS  Image quality degraded by mild motion.  Negative for fracture or mass. Spinal cord signal is normal. Craniocervical junction is normal.  C2-3:  Mild disc degeneration with a small central disc protrusion.  C3-4:  Mild disc and mild facet degeneration  C4-5: 1 mm retrolisthesis. Spondylosis and facet hypertrophy contribute to mild spinal stenosis and mild foraminal encroachment bilaterally.  C5-6:  Mild disc and facet degeneration.  C6-7:  Mild degenerative change  C7-T1: Negative  IMPRESSION: No acute infarct.  Mild chronic microvascular ischemia.  Chronic sinusitis.  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 No acute disc protrusion.   Electronically Signed   By: Marlan Palau M.D.   On: 01/14/2014 13:35   Mr Cervical Spine Wo Contrast  01/14/2014   CLINICAL DATA:  Right-sided weakness.  Dizziness and slurred speech  EXAM: MRI HEAD WITHOUT CONTRAST  MRI CERVICAL SPINE WITHOUT CONTRAST  TECHNIQUE: Multiplanar, multiecho pulse sequences of the brain and surrounding structures, and cervical spine, to include the craniocervical junction and cervicothoracic junction, were obtained without intravenous  contrast.  COMPARISON:  CT head 01/14/2014, MRI head 12/27/2013  FINDINGS: MRI HEAD FINDINGS  Limited sequences of the head per Dr. Amada Jupiter.  Negative for acute infarct. Scattered small white matter hyperintensities bilaterally consistent with mild chronic microvascular ischemia. Ventricle size is normal.  Mucosal edema throughout the paranasal sinuses.  MRI CERVICAL SPINE FINDINGS  Image quality degraded by mild motion.  Negative for fracture or mass. Spinal cord signal is normal. Craniocervical junction is normal.  C2-3:  Mild disc degeneration  with a small central disc protrusion.  C3-4:  Mild disc and mild facet degeneration  C4-5: 1 mm retrolisthesis. Spondylosis and facet hypertrophy contribute to mild spinal stenosis and mild foraminal encroachment bilaterally.  C5-6:  Mild disc and facet degeneration.  C6-7:  Mild degenerative change  C7-T1: Negative  IMPRESSION: No acute infarct.  Mild chronic microvascular ischemia.  Chronic sinusitis.  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 No acute disc protrusion.   Electronically Signed   By: Marlan Palau M.D.   On: 01/14/2014 13:35       Assessment and plan discussed with with attending physician and they are in agreement.    Felicie Morn PA-C [facility] [phone]  01/14/2014, 2:11 PM  I have seen and evaluated the patient. I have reviewed the above note and made appropriate changes.     Assessment: 63 y.o. male presenting with head ache associated with blurred vision, vertigo, bilateral leg weakness and right arm weakness. His recurrent episodes of dizziness, weakness, tingling associated with photophobic headache are strongly suggestive of complicated migraine. He just recently had a complete stroke workup and with negative imaging, I do not think that further workup needs to be done.   Gabapentin can sometimes be useful for calmig down frequent recurrent headaches, and could try this until he  sees Dr. [name] in follow up.   Stroke Risk Factors - hyperlipidemia, hypertension and CAD  1) Gabapentin 300mg  TID.  2) F/U with Dr. [name] at [facility] as per his last note, patient will call.    Ritta Slot, MD [facility] [phone]  If 7pm- 7am, please page neurology on call as listed in [facility].

## 2014-01-14 NOTE — ED Notes (Signed)
Pt returned from MRI, pt hooked up to heart monitor, EKG done, CBG checked, toileting offered.

## 2014-01-14 NOTE — ED Notes (Signed)
Pt still in MRI 

## 2014-01-14 NOTE — ED Provider Notes (Signed)
CSN: [id number]     Arrival date & time 01/14/14  1214 History   First MD Initiated Contact with Patient 01/14/14 1223     Chief Complaint  Patient presents with  . Code Stroke    Level 5 caveat urgency situation. Code stroke (Consider location/radiation/quality/duration/timing/severity/associated sxs/prior Treatment) HPI Complains of sudden onset blurred vision, , headache, diffuse , throbbig in nature and numbness and heaviness in both legs onset 11:15 AM today. Patient states he had similar episodes in the past with"blockages in my neck". No treatment prior to coming here. No other associated symptoms. Past Medical History  Diagnosis Date  . Peripheral arterial disease   . Hyperlipidemia   . Coronary artery disease   . Hypertension   . Barrett's esophagus   . Gastroesophageal reflux   . Cerebrovascular disease   . Myocardial infarction 2009    "during cardiac cath" (01/27/2013)  . H/O hiatal hernia   . AOZHYQMV(784.6)     "probably monthly" (01/27/2013)  . Seizures     "when I had my heart attack in 2009" (01/27/2013)  . 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 Arthritis     "hands" (01/27/2013)   Past Surgical History  Procedure Laterality Date  . Laparoscopic gastric banding  2010  . Cardiac catheterization    . Coronary angioplasty with stent placement  11/02/2007    "2" (01/27/2013)  . Laparoscopic nissen fundoplication  ?2001  . Femoral artery stent Right 01/26/2013  . Diagnostic laparoscopy      "twice after nissen; it kept coming apart " (01/27/2013)   Family History  Problem Relation Age of Onset  . Heart disease Mother     Heart Disease before age 65  . Hypertension Mother   . Heart attack Mother   . Heart attack Father    History  Substance Use Topics  . Smoking status: Former Smoker -- 2.00 packs/day for 35 years    Types: Cigarettes    Quit  date: 01/28/2001  . Smokeless tobacco: Never Used  . Alcohol Use: No     Comment: 01/27/2013 "quit drinking in 2001; I'm a recovering alcoholic"    Review of Systems  Unable to perform ROS: Acuity of condition  Eyes: Positive for visual disturbance.  Musculoskeletal: Positive for neck pain.  Neurological: Positive for weakness and headaches.      Allergies  Review of patient's allergies indicates no known allergies.  Home Medications   Prior to Admission medications   Medication Sig Start Date End Date Taking? Authorizing Provider  aspirin 325 MG EC tablet Take 1 tablet (325 mg total) by mouth daily. 12/31/13   Mcarthur Rossetti Angiulli, PA-C  clopidogrel (PLAVIX) 75 MG tablet Take 1 tablet (75 mg total) by mouth daily. 12/31/13   Mcarthur Rossetti Angiulli, PA-C  fish oil-omega-3 fatty acids 1000 MG capsule Take 1 g by mouth 2 (two) times daily.     Historical Provider, MD  lisinopril (PRINIVIL,ZESTRIL) 5 MG tablet Take 1 tablet (5 mg total) by mouth daily. 12/31/13   Mcarthur Rossetti Angiulli, PA-C  Multiple Vitamins-Minerals (ONE-A-DAY MENS VITACRAVES PO) Take by mouth daily.    Historical Provider, MD  nitroGLYCERIN (NITROSTAT) 0.4 MG SL tablet Place 0.4 mg under the tongue every 5 (five) minutes as needed for chest pain.    Historical Provider, MD  oxyCODONE-acetaminophen (PERCOCET/ROXICET) 5-325 MG per  tablet Take 1 tablet by mouth every 6 (six) hours as needed. 12/31/13   Mcarthur Rossetti Angiulli, PA-C  PARoxetine (PAXIL) 20 MG tablet Take 1 tablet (20 mg total) by mouth every morning. 12/31/13   Mcarthur Rossetti Angiulli, PA-C  ranitidine (ZANTAC) 150 MG tablet Take 1 tablet (150 mg total) by mouth 2 (two) times daily. 12/31/13   Mcarthur Rossetti Angiulli, PA-C  vitamin B-12 (CYANOCOBALAMIN) 500 MCG tablet Take 500 mcg by mouth daily.    Historical Provider, MD  Patient was examined by me at 2:10 PM after his return from MRI. He stated that weakness had improved. Headache improved and he and the vision changes are improving  SpO2  99% Physical Exam  Nursing note and vitals reviewed. Constitutional: He appears well-developed and well-nourished.  HENT:  Head: Normocephalic and atraumatic.  Eyes: Conjunctivae are normal. Pupils are equal, round, and reactive to light.  Neck: Neck supple. No tracheal deviation present. No thyromegaly present.  Cardiovascular: Normal rate and regular rhythm.   No murmur heard. Pulmonary/Chest: Effort normal and breath sounds normal.  Abdominal: Soft. Bowel sounds are normal. He exhibits no distension. There is no tenderness.  Musculoskeletal: Normal range of motion. He exhibits no edema and no tenderness.  Neurological: He is alert. He has normal reflexes. No cranial nerve deficit. Coordination normal.  DTRs symmetric bilaterally at knee jerk ankle jerk and biceps was ordered bilaterally. Gait normal Romberg normal. Motor strength 5 over 5 overall.  Skin: Skin is warm and dry. No rash noted.  Psychiatric: He has a normal mood and affect.    ED Course  Procedures (including critical care time) Labs Review Labs Reviewed  PROTIME-INR  APTT  CBC  DIFFERENTIAL  ETHANOL  COMPREHENSIVE METABOLIC PANEL  URINE RAPID DRUG SCREEN (HOSP PERFORMED)  URINALYSIS, ROUTINE W REFLEX MICROSCOPIC  I-STAT CHEM 8, ED  I-STAT TROPOININ, ED  I-STAT TROPOININ, ED    Imaging Review Ct Head Wo Contrast  01/14/2014   CLINICAL DATA:  Bilateral leg weakness, code stroke  EXAM: CT HEAD WITHOUT CONTRAST  TECHNIQUE: Contiguous axial images were obtained from the base of the skull through the vertex without intravenous contrast.  COMPARISON:  Head CT - 12/27/2013; brain MRI- 12/27/2013 CTA head and neck -12/20/2013  FINDINGS: Gray-white differentiation is maintained. No CT evidence of acute large territory infarct. No intraparenchymal or extra-axial mass or hemorrhage. Unchanged size and configuration of the ventricles and basilar cisterns. No midline shift. There is rather extensive polypoid mucosal  thickening within the bilateral maxillary sinuses and to a lesser extent, the bilateral frontal sinuses and bilateral anterior ethmoidal air cells. No discrete air-fluid levels. The mastoid air cells are normally aerated. Intracranial atherosclerosis. Regional soft tissues appear normal. No displaced calvarial fracture.  IMPRESSION: 1. No acute intracranial process. 2. Sinus disease as above.  No air-fluid levels.  Critical Value/emergent results were called by telephone at the time of interpretation on 01/14/2014 at 12:38 PM to Dr. Amada Jupiter, who verbally acknowledged these results.   Electronically Signed   By: Simonne Come M.D.   On: 01/14/2014 12:42     EKG Interpretation   Date/Time:  Friday Jan 14 2014 13:33:06 EDT Ventricular Rate:  64 PR Interval:  148 QRS Duration: 111 QT Interval:  423 QTC Calculation: 436 R Axis:   15 Text Interpretation:  Sinus rhythm RSR' in V1 or V2, right VCD or RVH No  significant change since last tracing Confirmed by Ethelda Chick  MD, Manisha Cancel  [phone]) on 01/14/2014 2:21:11 PM  Results for orders placed during the hospital encounter of 01/14/14  ETHANOL      Result Value Ref Range   Alcohol, Ethyl (B) <11  0 - 11 mg/dL  PROTIME-INR      Result Value Ref Range   Prothrombin Time 13.1  11.6 - 15.2 seconds   INR 1.01  0.00 - 1.49  APTT      Result Value Ref Range   aPTT 30  24 - 37 seconds  CBC      Result Value Ref Range   WBC 8.1  4.0 - 10.5 K/uL   RBC 4.75  4.22 - 5.81 MIL/uL   Hemoglobin 14.2  13.0 - 17.0 g/dL   HCT 16.1  09.6 - 04.5 %   MCV 85.9  78.0 - 100.0 fL   MCH 29.9  26.0 - 34.0 pg   MCHC 34.8  30.0 - 36.0 g/dL   RDW 40.9  81.1 - 91.4 %   Platelets 268  150 - 400 K/uL  DIFFERENTIAL      Result Value Ref Range   Neutrophils Relative % 70  43 - 77 %   Neutro Abs 5.7  1.7 - 7.7 K/uL   Lymphocytes Relative 15  12 - 46 %   Lymphs Abs 1.2  0.7 - 4.0 K/uL   Monocytes Relative 10  3 - 12 %   Monocytes Absolute 0.8  0.1 - 1.0 K/uL    Eosinophils Relative 4  0 - 5 %   Eosinophils Absolute 0.3  0.0 - 0.7 K/uL   Basophils Relative 1  0 - 1 %   Basophils Absolute 0.1  0.0 - 0.1 K/uL  COMPREHENSIVE METABOLIC PANEL      Result Value Ref Range   Sodium 139  137 - 147 mEq/L   Potassium 4.1  3.7 - 5.3 mEq/L   Chloride 100  96 - 112 mEq/L   CO2 25  19 - 32 mEq/L   Glucose, Bld 84  70 - 99 mg/dL   BUN 10  6 - 23 mg/dL   Creatinine, Ser 7.82  0.50 - 1.35 mg/dL   Calcium 9.4  8.4 - 95.6 mg/dL   Total Protein 7.4  6.0 - 8.3 g/dL   Albumin 4.0  3.5 - 5.2 g/dL   AST 21  0 - 37 U/L   ALT 16  0 - 53 U/L   Alkaline Phosphatase 87  39 - 117 U/L   Total Bilirubin 0.4  0.3 - 1.2 mg/dL   GFR calc non Af Amer 68 (*) >90 mL/min   GFR calc Af Amer 78 (*) >90 mL/min  URINE RAPID DRUG SCREEN (HOSP PERFORMED)      Result Value Ref Range   Opiates NONE DETECTED  NONE DETECTED   Cocaine NONE DETECTED  NONE DETECTED   Benzodiazepines NONE DETECTED  NONE DETECTED   Amphetamines NONE DETECTED  NONE DETECTED   Tetrahydrocannabinol NONE DETECTED  NONE DETECTED   Barbiturates NONE DETECTED  NONE DETECTED  URINALYSIS, ROUTINE W REFLEX MICROSCOPIC      Result Value Ref Range   Color, Urine YELLOW  YELLOW   APPearance CLEAR  CLEAR   Specific Gravity, Urine 1.010  1.005 - 1.030   pH 6.0  5.0 - 8.0   Glucose, UA NEGATIVE  NEGATIVE mg/dL   Hgb urine dipstick NEGATIVE  NEGATIVE   Bilirubin Urine NEGATIVE  NEGATIVE   Ketones, ur NEGATIVE  NEGATIVE mg/dL   Protein, ur NEGATIVE  NEGATIVE mg/dL  Urobilinogen, UA 1.0  0.0 - 1.0 mg/dL   Nitrite NEGATIVE  NEGATIVE   Leukocytes, UA NEGATIVE  NEGATIVE  I-STAT CHEM 8, ED      Result Value Ref Range   Sodium 137  137 - 147 mEq/L   Potassium 3.9  3.7 - 5.3 mEq/L   Chloride 101  96 - 112 mEq/L   BUN 9  6 - 23 mg/dL   Creatinine, Ser 2.95  0.50 - 1.35 mg/dL   Glucose, Bld 85  70 - 99 mg/dL   Calcium, Ion 6.21  3.08 - 1.30 mmol/L   TCO2 25  0 - 100 mmol/L   Hemoglobin 14.3  13.0 - 17.0 g/dL   HCT  65.7  84.6 - 96.2 %  I-STAT TROPOININ, ED      Result Value Ref Range   Troponin i, poc 0.00  0.00 - 0.08 ng/mL   Comment 3           CBG MONITORING, ED      Result Value Ref Range   Glucose-Capillary 88  70 - 99 mg/dL   Ct Angio Head W/cm &/or Wo Cm  12/28/2013   CLINICAL DATA:  Right-sided weakness has resolved. Persistent headaches. Hyperlipidemia. Hypertension. Seizures. Prior myocardial infarction.  EXAM: CT ANGIOGRAPHY HEAD AND NECK  TECHNIQUE: Multidetector CT imaging of the head and neck was performed using the standard protocol during bolus administration of intravenous contrast. Multiplanar CT image reconstructions and MIPs were obtained to evaluate the vascular anatomy. Carotid stenosis measurements (when applicable) are obtained utilizing NASCET criteria, using the distal internal carotid diameter as the denominator.  CONTRAST:  50mL OMNIPAQUE IOHEXOL 350 MG/ML SOLN  COMPARISON:  12/27/2013 MR brain and MR angiogram circle Willis.  FINDINGS: CTA HEAD FINDINGS  No intracranial hemorrhage.  No CT evidence of large acute infarct.  No intracranial enhancing lesion.  Opacification/mucosal thickening paranasal sinuses most notable left maxillary sinus with there is an air-fluid level. Acute sinusitis not excluded. Almost complete opacification of majority of the ethmoid sinus air cells without evidence of intraorbital extension of sinus disease.  Calcified cavernous segment internal carotid artery bilaterally with mild narrowing.  Mild irregularity middle cerebral artery branch vessels.  Fetal type origin of the left posterior communicating artery.  Slightly ectatic vertebral arteries without high-grade stenosis.  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  Posterior circulation mild branch vessel irregularity.  Review of the MIP images confirms the above findings.  CTA NECK FINDINGS  Aberrant origin of the right subclavian artery.  Right vertebral artery and right common  carotid artery with common origin.  Left vertebral artery arises directly from the aortic arch with mild narrowing proximal aspect.  Plaque carotid bifurcation bilaterally with less than 50% diameter stenosis bilaterally.  No worrisome primary neck mass identified.  Scattered normal to top-normal size lymph nodes throughout the neck of questionable significance/etiology.  Mild cervical spondylotic changes with various degrees of spinal stenosis and foraminal narrowing.  Circumferential thickening of the cervical/ upper thoracic esophagus may be related to under distension although evaluation is limited.  Caries of residual teeth.  Review of the MIP images confirms the above findings.  IMPRESSION: CTA HEAD:  Opacification/mucosal thickening paranasal sinuses most notable left maxillary sinus with there is an air-fluid level. Acute sinusitis not excluded. Almost complete opacification of majority of the ethmoid sinus air cells without evidence of intraorbital extension of sinus disease.  Calcified cavernous segment internal carotid artery bilaterally with mild narrowing.  Mild irregularity middle cerebral  artery branch vessels.  Fetal type origin of the left posterior communicating artery.  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  Posterior circulation mild branch vessel irregularity.  CTA NECK :  Aberrant origin of the right subclavian artery.  Right vertebral artery and right common carotid artery with common origin.  Left vertebral artery arises directly from the aortic arch with mild narrowing proximal aspect.  Plaque carotid bifurcation bilaterally with less than 50% diameter stenosis bilaterally.  Scattered normal to top-normal size lymph nodes throughout the neck of questionable significance/etiology.  Circumferential thickening of the cervical/ upper thoracic esophagus may be related to under distension although evaluation is limited.   Electronically Signed   By: Bridgett Larsson M.D.    On: 12/28/2013 13:09   Ct Head Wo Contrast  01/14/2014   CLINICAL DATA:  Bilateral leg weakness, code stroke  EXAM: CT HEAD WITHOUT CONTRAST  TECHNIQUE: Contiguous axial images were obtained from the base of the skull through the vertex without intravenous contrast.  COMPARISON:  Head CT - 12/27/2013; brain MRI- 12/27/2013 CTA head and neck -12/20/2013  FINDINGS: Gray-white differentiation is maintained. No CT evidence of acute large territory infarct. No intraparenchymal or extra-axial mass or hemorrhage. Unchanged size and configuration of the ventricles and basilar cisterns. No midline shift. There is rather extensive polypoid mucosal thickening within the bilateral maxillary sinuses and to a lesser extent, the bilateral frontal sinuses and bilateral anterior ethmoidal air cells. No discrete air-fluid levels. The mastoid air cells are normally aerated. Intracranial atherosclerosis. Regional soft tissues appear normal. No displaced calvarial fracture.  IMPRESSION: 1. No acute intracranial process. 2. Sinus disease as above.  No air-fluid levels.  Critical Value/emergent results were called by telephone at the time of interpretation on 01/14/2014 at 12:38 PM to Dr. Amada Jupiter, who verbally acknowledged these results.   Electronically Signed   By: Simonne Come M.D.   On: 01/14/2014 12:42   Ct Head Wo Contrast  12/27/2013   CLINICAL DATA:  Right lower extremity weakness of acute onset  EXAM: CT HEAD WITHOUT CONTRAST  TECHNIQUE: Contiguous axial images were obtained from the base of the skull through the vertex without intravenous contrast. Study was obtained within 24 hr of patient's arrival at the emergency department.  COMPARISON:  September 08, 2007  FINDINGS: The ventricles are normal in size and configuration. There is no appreciable mass, hemorrhage, extra-axial fluid collection, or midline shift. There is subtle decreased attenuation in the lower right mid brain and upper right pons, concerning for recent  infarct in this area. Elsewhere, gray-white compartments appear normal. There is mild stable increased attenuation in both middle cerebral arteries, a stable symmetric finding of doubtful significance.  Bony calvarium appears intact. The mastoid air cells are clear. There is mucosal thickening in both maxillary antra as well as extensive ethmoid sinus disease bilaterally. There is moderate mucosal thickening in the inferior frontal sinuses bilaterally as well as in the left and right sphenoid sinuses.  IMPRESSION: Question recent infarct in the lower right mid brain and upper right pons. Elsewhere gray-white compartments appear normal. No acute hemorrhage or mass. Multifocal paranasal sinus disease, most pronounced in the ethmoid complexes bilaterally.  Critical Value/emergent results were called by telephone at the time of interpretation on 12/27/2013 at 12:52 PM to Dr. Cyril Mourning, neurology3, who verbally acknowledged these results.   Electronically Signed   By: Bretta Bang M.D.   On: 12/27/2013 12:56   Ct Angio Neck W/cm &/or Wo/cm  12/28/2013   CLINICAL  DATA:  Right-sided weakness has resolved. Persistent headaches. Hyperlipidemia. Hypertension. Seizures. Prior myocardial infarction.  EXAM: CT ANGIOGRAPHY HEAD AND NECK  TECHNIQUE: Multidetector CT imaging of the head and neck was performed using the standard protocol during bolus administration of intravenous contrast. Multiplanar CT image reconstructions and MIPs were obtained to evaluate the vascular anatomy. Carotid stenosis measurements (when applicable) are obtained utilizing NASCET criteria, using the distal internal carotid diameter as the denominator.  CONTRAST:  50mL OMNIPAQUE IOHEXOL 350 MG/ML SOLN  COMPARISON:  12/27/2013 MR brain and MR angiogram circle Willis.  FINDINGS: CTA HEAD FINDINGS  No intracranial hemorrhage.  No CT evidence of large acute infarct.  No intracranial enhancing lesion.  Opacification/mucosal thickening paranasal sinuses  most notable left maxillary sinus with there is an air-fluid level. Acute sinusitis not excluded. Almost complete opacification of majority of the ethmoid sinus air cells without evidence of intraorbital extension of sinus disease.  Calcified cavernous segment internal carotid artery bilaterally with mild narrowing.  Mild irregularity middle cerebral artery branch vessels.  Fetal type origin of the left posterior communicating artery.  Slightly ectatic vertebral arteries without high-grade stenosis.  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  Posterior circulation mild branch vessel irregularity.  Review of the MIP images confirms the above findings.  CTA NECK FINDINGS  Aberrant origin of the right subclavian artery.  Right vertebral artery and right common carotid artery with common origin.  Left vertebral artery arises directly from the aortic arch with mild narrowing proximal aspect.  Plaque carotid bifurcation bilaterally with less than 50% diameter stenosis bilaterally.  No worrisome primary neck mass identified.  Scattered normal to top-normal size lymph nodes throughout the neck of questionable significance/etiology.  Mild cervical spondylotic changes with various degrees of spinal stenosis and foraminal narrowing.  Circumferential thickening of the cervical/ upper thoracic esophagus may be related to under distension although evaluation is limited.  Caries of residual teeth.  Review of the MIP images confirms the above findings.  IMPRESSION: CTA HEAD:  Opacification/mucosal thickening paranasal sinuses most notable left maxillary sinus with there is an air-fluid level. Acute sinusitis not excluded. Almost complete opacification of majority of the ethmoid sinus air cells without evidence of intraorbital extension of sinus disease.  Calcified cavernous segment internal carotid artery bilaterally with mild narrowing.  Mild irregularity middle cerebral artery branch vessels.  Fetal type  origin of the left posterior communicating artery.  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  Posterior circulation mild branch vessel irregularity.  CTA NECK :  Aberrant origin of the right subclavian artery.  Right vertebral artery and right common carotid artery with common origin.  Left vertebral artery arises directly from the aortic arch with mild narrowing proximal aspect.  Plaque carotid bifurcation bilaterally with less than 50% diameter stenosis bilaterally.  Scattered normal to top-normal size lymph nodes throughout the neck of questionable significance/etiology.  Circumferential thickening of the cervical/ upper thoracic esophagus may be related to under distension although evaluation is limited.   Electronically Signed   By: Bridgett Larsson M.D.   On: 12/28/2013 13:09   Ct Angio Chest W/cm &/or Wo Cm  12/27/2013   CLINICAL DATA:  Chest pain radiating to back question aortic dissection, history coronary artery disease post MI, hypertension, former smoker  EXAM: CT ANGIOGRAPHY CHEST, ABDOMEN AND PELVIS  TECHNIQUE: Multidetector CT imaging through the chest, abdomen and pelvis was performed using the standard protocol during bolus administration of intravenous contrast. Multiplanar reconstructed images and MIPs were obtained and reviewed  to evaluate the vascular anatomy. Precontrast images of the thorax were also obtained.  CONTRAST:  100 ml Omnipaque 350 IV.  COMPARISON:  None  FINDINGS: CTA CHEST FINDINGS  Atherosclerotic calcifications aorta and coronary arteries with question coronary stents.  No intramural hematoma identified on precontrast images.  Normal aortic enhancement following contrast without aortic aneurysm or dissection.  Pulmonary arteries appear grossly patent without evidence of pulmonary embolism on exam optimized for aortic imaging.  Minimally enlarged LEFT hilar lymph node 13 mm short axis image 73.  Small hiatal hernia.  Dependent atelectasis in both lungs.   No acute infiltrate, pleural effusion or pneumothorax.  Bone island T6 vertebral body.  Review of the MIP images confirms the above findings.  CTA ABDOMEN AND PELVIS FINDINGS  Scattered atherosclerotic calcifications without aneurysm or dissection.  Normal variant separate origin of common hepatic artery from aorta.  Mild narrowing of proximal SMA and origin of celiac artery.  Large gallstones dependently in gallbladder.  Liver, spleen, pancreas, kidneys, and adrenal glands normal appearance.  Normal appendix.  Scattered diverticulosis of the descending and sigmoid colon without diverticulitis.  Stomach and bowel loops otherwise unremarkable for technique.  No mass, adenopathy, free fluid or inflammatory process.  BILATERAL inguinal hernias containing fat.  No acute osseous findings.  Review of the MIP images confirms the above findings.  IMPRESSION: Scattered atherosclerotic disease changes without evidence of aneurysm or dissection.  Small hiatal hernia.  Single minimally enlarged nonspecific LEFT hilar lymph node.  Cholelithiasis.  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  BILATERAL inguinal hernias containing fat.  Distal colonic diverticulosis without evidence of diverticulitis.   Electronically Signed   By: Ulyses Southward M.D.   On: 12/27/2013 13:34   Mr Brain Wo Contrast  01/14/2014   CLINICAL DATA:  Right-sided weakness.  Dizziness and slurred speech  EXAM: MRI HEAD WITHOUT CONTRAST  MRI CERVICAL SPINE WITHOUT CONTRAST  TECHNIQUE: Multiplanar, multiecho pulse sequences of the brain and surrounding structures, and cervical spine, to include the craniocervical junction and cervicothoracic junction, were obtained without intravenous contrast.  COMPARISON:  CT head 01/14/2014, MRI head 12/27/2013  FINDINGS: MRI HEAD FINDINGS  Limited sequences of the head per Dr. Amada Jupiter.  Negative for acute infarct. Scattered small white matter hyperintensities bilaterally consistent with mild chronic  microvascular ischemia. Ventricle size is normal.  Mucosal edema throughout the paranasal sinuses.  MRI CERVICAL SPINE FINDINGS  Image quality degraded by mild motion.  Negative for fracture or mass. Spinal cord signal is normal. Craniocervical junction is normal.  C2-3:  Mild disc degeneration with a small central disc protrusion.  C3-4:  Mild disc and mild facet degeneration  C4-5: 1 mm retrolisthesis. Spondylosis and facet hypertrophy contribute to mild spinal stenosis and mild foraminal encroachment bilaterally.  C5-6:  Mild disc and facet degeneration.  C6-7:  Mild degenerative change  C7-T1: Negative  IMPRESSION: No acute infarct.  Mild chronic microvascular ischemia.  Chronic sinusitis.  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 No acute disc protrusion.   Electronically Signed   By: Marlan Palau M.D.   On: 01/14/2014 13:35   Mr Brain Wo Contrast  12/27/2013   CLINICAL DATA:  Acute onset of right lower extremity weakness. Former smoker. Hyperlipidemia. Hypertension. Seizures.  EXAM: MRI HEAD WITHOUT CONTRAST  MRA HEAD WITHOUT CONTRAST  TECHNIQUE: Multiplanar, multiecho pulse sequences of the brain and surrounding structures were obtained without intravenous contrast. Angiographic images of the head were obtained using MRA technique  without contrast.  COMPARISON:  12/27/2013 head CT.  01/26/2007 brain MR.  FINDINGS: MRI HEAD FINDINGS  No acute infarct.  No intracranial hemorrhage.  Very mild nonspecific white matter type changes probably related to result of small vessel disease.  No intracranial mass lesion noted on this unenhanced exam.  Mild to moderate paranasal sinus mucosal thickening with opacification of majority of ethmoid sinus air cells. No obvious intracranial or intraorbital extension.  Cervical medullary junction, pituitary region, pineal region and orbital structures unremarkable.  Major intracranial vascular structures are patent.  MRA HEAD FINDINGS  Anterior  circulation without medium or large size vessel significant stenosis or occlusion. Mild branch vessel irregularity most notable involving middle cerebral artery branches.  Fetal type contribution to the left posterior cerebral artery.  Ectatic vertebral arteries and basilar artery without high-grade stenosis.  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  Nonvisualized left anterior inferior cerebral artery.  Mild irregularity and narrowing involving portions of the superior cerebellar artery, posterior cerebral artery distal branches, right anterior inferior cerebellar artery and posterior inferior cerebellar arteries.  IMPRESSION: MRI HEAD:  No acute infarct.  Very mild nonspecific white matter type changes probably related to result of small vessel disease.  Mild to moderate paranasal sinus mucosal thickening with opacification of majority of ethmoid sinus air cells.  MRA HEAD:  Mild branch vessel irregularity as noted above.   Electronically Signed   By: Bridgett Larsson M.D.   On: 12/27/2013 17:56   Mr Cervical Spine Wo Contrast  01/14/2014   CLINICAL DATA:  Right-sided weakness.  Dizziness and slurred speech  EXAM: MRI HEAD WITHOUT CONTRAST  MRI CERVICAL SPINE WITHOUT CONTRAST  TECHNIQUE: Multiplanar, multiecho pulse sequences of the brain and surrounding structures, and cervical spine, to include the craniocervical junction and cervicothoracic junction, were obtained without intravenous contrast.  COMPARISON:  CT head 01/14/2014, MRI head 12/27/2013  FINDINGS: MRI HEAD FINDINGS  Limited sequences of the head per Dr. Amada Jupiter.  Negative for acute infarct. Scattered small white matter hyperintensities bilaterally consistent with mild chronic microvascular ischemia. Ventricle size is normal.  Mucosal edema throughout the paranasal sinuses.  MRI CERVICAL SPINE FINDINGS  Image quality degraded by mild motion.  Negative for fracture or mass. Spinal cord signal is normal. Craniocervical  junction is normal.  C2-3:  Mild disc degeneration with a small central disc protrusion.  C3-4:  Mild disc and mild facet degeneration  C4-5: 1 mm retrolisthesis. Spondylosis and facet hypertrophy contribute to mild spinal stenosis and mild foraminal encroachment bilaterally.  C5-6:  Mild disc and facet degeneration.  C6-7:  Mild degenerative change  C7-T1: Negative  IMPRESSION: No acute infarct.  Mild chronic microvascular ischemia.  Chronic sinusitis.  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 No acute disc protrusion.   Electronically Signed   By: Marlan Palau M.D.   On: 01/14/2014 13:35   Dg Chest Port 1 View  12/27/2013   CLINICAL DATA:  Stroke  EXAM: PORTABLE CHEST - 1 VIEW  COMPARISON:  CT ANGIO CHEST W/CM &/OR WO/CM dated 12/27/2013  FINDINGS: The heart size and mediastinal contours are within normal limits. Both lungs are clear. The visualized skeletal structures are unremarkable.  IMPRESSION: No active disease.   Electronically Signed   By: Elige Ko   On: 12/27/2013 13:56   Mr Maxine Glenn Head/brain Wo Cm  12/27/2013   CLINICAL DATA:  Acute onset of right lower extremity weakness. Former smoker. Hyperlipidemia. Hypertension. Seizures.  EXAM: MRI HEAD WITHOUT CONTRAST  MRA HEAD WITHOUT CONTRAST  TECHNIQUE: Multiplanar, multiecho pulse sequences of the brain and surrounding structures were obtained without intravenous contrast. Angiographic images of the head were obtained using MRA technique without contrast.  COMPARISON:  12/27/2013 head CT.  01/26/2007 brain MR.  FINDINGS: MRI HEAD FINDINGS  No acute infarct.  No intracranial hemorrhage.  Very mild nonspecific white matter type changes probably related to result of small vessel disease.  No intracranial mass lesion noted on this unenhanced exam.  Mild to moderate paranasal sinus mucosal thickening with opacification of majority of ethmoid sinus air cells. No obvious intracranial or intraorbital extension.  Cervical  medullary junction, pituitary region, pineal region and orbital structures unremarkable.  Major intracranial vascular structures are patent.  MRA HEAD FINDINGS  Anterior circulation without medium or large size vessel significant stenosis or occlusion. Mild branch vessel irregularity most notable involving middle cerebral artery branches.  Fetal type contribution to the left posterior cerebral artery.  Ectatic vertebral arteries and basilar artery without high-grade stenosis.  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  Nonvisualized left anterior inferior cerebral artery.  Mild irregularity and narrowing involving portions of the superior cerebellar artery, posterior cerebral artery distal branches, right anterior inferior cerebellar artery and posterior inferior cerebellar arteries.  IMPRESSION: MRI HEAD:  No acute infarct.  Very mild nonspecific white matter type changes probably related to result of small vessel disease.  Mild to moderate paranasal sinus mucosal thickening with opacification of majority of ethmoid sinus air cells.  MRA HEAD:  Mild branch vessel irregularity as noted above.   Electronically Signed   By: Bridgett LarssonSteve  Olson M.D.   On: 12/27/2013 17:56   Ct Cta Abd/pel W/cm &/or W/o Cm  12/27/2013   CLINICAL DATA:  Chest pain radiating to back question aortic dissection, history coronary artery disease post MI, hypertension, former smoker  EXAM: CT ANGIOGRAPHY CHEST, ABDOMEN AND PELVIS  TECHNIQUE: Multidetector CT imaging through the chest, abdomen and pelvis was performed using the standard protocol during bolus administration of intravenous contrast. Multiplanar reconstructed images and MIPs were obtained and reviewed to evaluate the vascular anatomy. Precontrast images of the thorax were also obtained.  CONTRAST:  100 ml Omnipaque 350 IV.  COMPARISON:  None  FINDINGS: CTA CHEST FINDINGS  Atherosclerotic calcifications aorta and coronary arteries with question coronary stents.   No intramural hematoma identified on precontrast images.  Normal aortic enhancement following contrast without aortic aneurysm or dissection.  Pulmonary arteries appear grossly patent without evidence of pulmonary embolism on exam optimized for aortic imaging.  Minimally enlarged LEFT hilar lymph node 13 mm short axis image 73.  Small hiatal hernia.  Dependent atelectasis in both lungs.  No acute infiltrate, pleural effusion or pneumothorax.  Bone island T6 vertebral body.  Review of the MIP images confirms the above findings.  CTA ABDOMEN AND PELVIS FINDINGS  Scattered atherosclerotic calcifications without aneurysm or dissection.  Normal variant separate origin of common hepatic artery from aorta.  Mild narrowing of proximal SMA and origin of celiac artery.  Large gallstones dependently in gallbladder.  Liver, spleen, pancreas, kidneys, and adrenal glands normal appearance.  Normal appendix.  Scattered diverticulosis of the descending and sigmoid colon without diverticulitis.  Stomach and bowel loops otherwise unremarkable for technique.  No mass, adenopathy, free fluid or inflammatory process.  BILATERAL inguinal hernias containing fat.  No acute osseous findings.  Review of the MIP images confirms the above findings.  IMPRESSION: Scattered atherosclerotic disease changes without evidence of aneurysm or dissection.  Small  hiatal hernia.  Single minimally enlarged nonspecific LEFT hilar lymph node.  Cholelithiasis.  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  BILATERAL inguinal hernias containing fat.  Distal colonic diverticulosis without evidence of diverticulitis.   Electronically Signed   By: Ulyses SouthwardMark  Boles M.D.   On: 12/27/2013 13:34     Dr. Amada JupiterKirkpatrick, neurology and I were present on immediately upon patient's arrival at the ambulance bay . 3:30 PM patient sleeping easily arousable after treatment with intravenous Compazine and Benadryl the MDM  Dr. Amada JupiterKirkpatrick feels acute stroke today  unlikely. Likrly migraine equivalent Pt does not need further diagnostic evaluation. Final diagnoses:  None   Plan: D/c to home Rx gabapentin. F/u Dr. Pearlean BrownieSethi as out pt Dx Migraine    Doug SouSam Ileene Allie, MD 01/14/14 1540

## 2014-01-14 NOTE — Discharge Instructions (Signed)
Call Dr.[name]'s office on Monday, 01/17/2014 to schedule an office visit for within one month. Take the medication prescribed to help with your symptoms. Return if your condition worsens for any reason

## 2014-01-14 NOTE — Code Documentation (Signed)
63[facility].  Patient reporting that he had sudden onset of dizziness, blurred vision and bilateral weakness.  Patient reports that he was recently seen in the hospital for a blockage in his neck.  He reports having TIAs.  He reports severe head and neck pain.  Patient taken to CT.  NIHSS 10 on arrival, see documentation for details.  Patient taken to MRI with Stroke RN.  Patient tolerated MRI, BP 135/60 (110), 99%, HR in the 60s.  Dr. Amada JupiterKirkpatrick aware of MRI results.  Code stroke cancelled at 1330. Patient back to room and bedside handoff with ED RN Danielle.

## 2014-01-14 NOTE — ED Notes (Signed)
Per EMS: Pt was at home sitting on couch at 1115 when he had acute onset of neck pain and HA, also reports bilateral leg weakness and numbness. 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 Pt axo x4, reports HA and dizziness, states he has hx of seizures, feels like these symptoms are the same. Also seen here last week and dx with TIA. nad noted.

## 2014-01-17 ENCOUNTER — Telehealth: Payer: Self-pay | Admitting: Neurology

## 2014-01-17 NOTE — Telephone Encounter (Signed)
Wife calling for a follow up with Pearlean BrownieSethi. Does not want to wait until August which is the [facility]. States the patient is still having "these episodes" and needs to be seen. Also states when he was back in the hospital on Friday was advised to make an appointment to come in as soon as possible.

## 2014-01-18 NOTE — Telephone Encounter (Signed)
Called patient wife back asked  her to return my call about 4pm.

## 2014-01-18 NOTE — Telephone Encounter (Signed)
Called patients wife and she stated her husband was sent back to the hospital , they released him for headaches and i gave them a 44month stroke  F/u. I told the wife to bring copay and current med list.

## 2014-02-03 ENCOUNTER — Ambulatory Visit (INDEPENDENT_AMBULATORY_CARE_PROVIDER_SITE_OTHER): Payer: Medicare Other | Admitting: Nurse Practitioner

## 2014-02-03 ENCOUNTER — Encounter: Payer: Self-pay | Admitting: Nurse Practitioner

## 2014-02-03 ENCOUNTER — Encounter (INDEPENDENT_AMBULATORY_CARE_PROVIDER_SITE_OTHER): Payer: Self-pay

## 2014-02-03 VITALS — BP 117/68 | HR 74 | Ht 69.5 in | Wt 202.0 lb

## 2014-02-03 DIAGNOSIS — I639 Cerebral infarction, unspecified: Secondary | ICD-10-CM

## 2014-02-03 DIAGNOSIS — R404 Transient alteration of awareness: Secondary | ICD-10-CM

## 2014-02-03 DIAGNOSIS — I6350 Cerebral infarction due to unspecified occlusion or stenosis of unspecified cerebral artery: Secondary | ICD-10-CM

## 2014-02-03 MED ORDER — TOPIRAMATE 50 MG PO TABS: 50.0000 mg | ORAL_TABLET | Freq: Every day | ORAL | Status: DC

## 2014-02-03 MED ORDER — TOPIRAMATE 50 MG PO TABS: 50.0000 mg | ORAL_TABLET | Freq: Two times a day (BID) | ORAL | Status: DC

## 2014-02-03 NOTE — Patient Instructions (Signed)
Continue aspirin 81 mg orally every day and clopidogrel 75 mg orally every day  for secondary stroke prevention and maintain strict control of hypertension with blood pressure goal below 130/90, and lipids with LDL cholesterol goal below 100 mg/dL.   Start Topirimate 50 mg 1 tablet at bedtime each day, as a Migraine preventative.  Sid effects at first may be tingling in fingers and toes, and carbonated drinks will taste funny.  We will check an EEG test here in the office, someone will call within 2 weeks to schedule this test.  Followup in the future with Dr. Pearlean BrownieSethi in 2 months.   Stroke Prevention Some medical conditions and behaviors are associated with an increased chance of having a stroke. You may prevent a stroke by making healthy choices and managing medical conditions. HOW CAN I REDUCE MY RISK OF HAVING A STROKE?   Stay physically active. Get at least 30 minutes of activity on most or all days.  Do not smoke. It may also be helpful to avoid exposure to secondhand smoke.  Limit alcohol use. Moderate alcohol use is considered to be:  No more than 2 drinks per day for men.  No more than 1 drink per day for nonpregnant women.  Eat healthy foods. This involves  Eating 5 or more servings of fruits and vegetables a day.  Following a diet that addresses high blood pressure (hypertension), high cholesterol, diabetes, or obesity.  Manage your cholesterol levels.  A diet low in saturated fat, trans fat, and cholesterol and high in fiber may control cholesterol levels.  Take any prescribed medicines to control cholesterol as directed by your health care provider.  Manage your diabetes.  A controlled-carbohydrate, controlled-sugar diet is recommended to manage diabetes.  Take any prescribed medicines to control diabetes as directed by your health care provider.  Control your hypertension.  A low-salt (sodium), low-saturated fat, low-trans fat, and low-cholesterol diet is  recommended to manage hypertension.  Take any prescribed medicines to control hypertension as directed by your health care provider.  Maintain a healthy weight.  A reduced-calorie, low-sodium, low-saturated fat, low-trans fat, low-cholesterol diet is recommended to manage weight.  Stop drug abuse.  Avoid taking birth control pills.  Talk to your health care provider about the risks of taking birth control pills if you are over 61 years old, smoke, get migraines, or have ever had a blood clot.  Get evaluated for sleep disorders (sleep apnea).  Talk to your health care provider about getting a sleep evaluation if you snore a lot or have excessive sleepiness.  Take medicines as directed by your health care provider.  For some people, aspirin or blood thinners (anticoagulants) are helpful in reducing the risk of forming abnormal blood clots that can lead to stroke. If you have the irregular heart rhythm of atrial fibrillation, you should be on a blood thinner unless there is a good reason you cannot take them.  Understand all your medicine instructions.  Make sure that other other conditions (such as anemia or atherosclerosis) are addressed. SEEK IMMEDIATE MEDICAL CARE IF:   You have sudden weakness or numbness of the face, arm, or leg, especially on one side of the body.  Your face or eyelid droops to one side.  You have sudden confusion.  You have trouble speaking (aphasia) or understanding.  You have sudden trouble seeing in one or both eyes.  You have sudden trouble walking.  You have dizziness.  You have a loss of balance or coordination.  You have a sudden, severe headache with no known cause.  You have new chest pain or an irregular heartbeat. Any of these symptoms may represent a serious problem that is an emergency. Do not wait to see if the symptoms will go away. Get medical help at once. Call your local emergency services  (911 in [place] Do not drive yourself to  the hospital. Document Released: 09/26/2004 Document Revised: 06/09/2013 Document Reviewed: 02/19/2013 [facility] Patient Information 2014 [place], [place].

## 2014-02-03 NOTE — Progress Notes (Signed)
PATIENT: Justin Wells DOB: Jun 18, 1951  REASON FOR VISIT: hospital follow up for stroke and complicated Migraine HISTORY FROM: patient  HISTORY OF PRESENT ILLNESS: Justin Wells is an 63 y.o. male with a past medical history significant for HTN, hyperlipidemia, CAD s/p stent deployment x 3, MI, PAD s/p femoral stenting, TIA's, ischemic stroke in the setting of acute MI 2014, brought to [facility] ED by ambulance as a code stroke due to acute onset of right hemiparesis, right facial droop 12/27/2013 at 1100. He said that he was home sitting in a couch when started feeling weak all over, developed severe dizziness " like room spinning" and then ambulance was called. According to patient and EMS, weakness of the right arm-leg and face as well as chest pain developed while in the truck. No double vision, difficulty swallowing, slurred speech, language or vision impairment. Chest pain radiated to his shoulders and back. NIHSS 4, BUT WITH SOME INCONSISTENCIES (1 for sensory, 1 for right arm weakness, 2 for right arm weakness). CT brain showed a subtle decreased attenuation in the lower right mid brain and upper right pons, concerning for acute ischemia in this area. CT angio head and neck showed atherosclerotic changes but no high grade stenosis.  Patient underwent CTA chest that showed no evidence of dissection or PE. He takes plavix daily. Patient was administered TPA; it was delayed due to CP and concern for aortic dissection/PE. He was admitted to the neuro ICU for further evaluation and treatment.  Imaging at that time did not confirm acute infarct. He was believed to have a left brain posterior circulation small infarct not visualized on MRI.   Patient returned to hospital on 01/14/14 after noting a sudden onset of posterior neck discomfort, vertigo, blurred vision and bilateral LE weakness.  EMS arrived at house and transported him to [facility] as a Code stroke. On arrival patient states he did have history of  migraine HA which are very similar in nature with HA, blurred vision and bilateral LE weakness. Initial CT head was negative.  MRI was negative for acute infarct.  He was diagnosed with Complicated Migraine and his Gabapentin was started at 300 mg tid as a preventative for Migraine.  Since discharge in April, he has right-sided weakness and numbness in the right hand and foot.  He states his hearing is worse in his right ear.  He has continued to have spells where he feels hot around his head and neck, will have sharp pain in the neck, with or without headache, and feels weak in his legs. During these episodes wife states he is slow to respond.  His wife states that when this happens, he must go lie down for several hours.  These episodes are occuring every 2-3 days.  He continues to take Plavix and aspirin daily.  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  He is not driving.  Wife states he was ordered outpatient PT but he cannot afford to go.  Wife is on permanent disability.  Blood pressure is well controlled. Hgba1c 5.4. LDL 85  REVIEW OF SYSTEMS: Full 14 system review of systems performed and notable only for:  Hearing loss, drooling, cough, chest tightness, back pain, muscle cramps, walking difficulty, neck pain, neck stiffness, bruise easily, dizziness, headache, numbness, anxiety  ALLERGIES: No Known Allergies  HOME MEDICATIONS: Outpatient Prescriptions Prior to Visit  Medication Sig Dispense Refill  . aspirin EC 81 MG tablet Take 81 mg by  mouth daily.      . clopidogrel (PLAVIX) 75 MG tablet Take 1 tablet (75 mg total) by mouth daily.  90 tablet  3  . fish oil-omega-3 fatty acids 1000 MG capsule Take 1 g by mouth 2 (two) times daily.       Marland Kitchen. lisinopril (PRINIVIL,ZESTRIL) 5 MG tablet Take 1 tablet (5 mg total) by mouth daily.  30 tablet  1  . Multiple Vitamins-Minerals (ONE-A-DAY MENS VITACRAVES PO) Take by mouth daily.      . nitroGLYCERIN (NITROSTAT) 0.4 MG SL tablet Place 0.4  mg under the tongue every 5 (five) minutes as needed for chest pain.      Marland Kitchen. oxyCODONE-acetaminophen (PERCOCET/ROXICET) 5-325 MG per tablet Take 1 tablet by mouth every 6 (six) hours as needed for moderate pain.      Marland Kitchen. PARoxetine (PAXIL) 20 MG tablet Take 1 tablet (20 mg total) by mouth every morning.  30 tablet  1  . ranitidine (ZANTAC) 150 MG tablet Take 1 tablet (150 mg total) by mouth 2 (two) times daily.  60 tablet  1  . gabapentin (NEURONTIN) 100 MG capsule Take 1 capsule (100 mg total) by mouth 3 (three) times daily.  90 capsule  0   No facility-administered medications prior to visit.    PHYSICAL EXAM  Filed Vitals:   02/03/14 1104  BP: 117/68  Pulse: 74  Height: 5' 9.5" (1.765 m)  Weight: 202 lb (91.627 kg)   Body mass index is 29.41 kg/(m^2).  Visual Acuity Screening   Right eye Left eye Both eyes  Without correction: 20/30 20/30   With correction:      Generalized: Well developed, in no acute distress  Head: normocephalic and atraumatic. Oropharynx benign  Neck: Supple, no carotid bruits  Cardiac: Regular rate rhythm, no murmur  Musculoskeletal: No deformity, limited ROM with flexion and extension in neck, left and right. Soreness in cervical neck muscles with moderate palpation.  Neurological examination  Mentation: Alert oriented to time, place, history taking. Follows all commands speech and language fluent Cranial nerve II-XII:  Pupils were equal round reactive to light extraocular movements were full, visual field were full on confrontational test. Facial sensation and strength were normal, except mild right lower facial weakness.  Hearing was decreased to finger rubbing on the right. Uvula tongue midline. Head turning  Limited by pain.  Shoulder shrug decreased on the right.  Tongue protrusion into cheek strength was normal. Motor: The motor testing reveals 5/5 strength of left-sided extremities. Right upper extremity 3/5 with give away effort. Right lower extremity  4/5 strength.  Good symmetric motor tone is noted throughout. Moderate weakness of the right grip and intrinsic hand muscles but poor effort. Sensory: Sensory testing is decreased to soft touch, and pinprick on right-sided extremities. No evidence of extinction is noted.  Coordination: Cerebellar testing reveals good finger-nose-finger and heel-to-shin bilaterally.  Gait and station: Gait is antalgic. Tandem gait is unsteady. Romberg is negative.  Reflexes: Deep tendon reflexes are symmetric and normal bilaterally.   NIHSS: 2 MRs: 1  ASSESSMENT AND PLAN Justin Wells is a 63 y.o. male presenting with acute onset right hemiparesis on 12/27/13, status post IV t-PA. Imaging confirmed no acute infarct. Dx: left brain posterior circulation small infarct not visualized on MRI versus TIA. Given history of sweating episodes w/ neuro changes that last 24h, concern for posterior circulation TIAs in past. Review of his old records reveal similar episode in 2009 with MRA of the neck  showing no significant extracranial stenosis. On aspirin 81 mg orally every day and clopidogrel 75 mg orally every day prior to admission.  Vascular risk factors of Hypertension, CAD - MI, stent 2009, Hx stroke 2009, 2010 - right hemiparesis face/arm/leg, PAD - right femoral artery stent 2014.  I had a long discussion with the patient and wife regarding his recent episodes, discussed results of evaluation in the hospital and answered questions.  He continues to have episodes that may be complicated migraine vs. TIAs, or possibly even seizure.  I have advised him to stop Gabapentin as this has been no benefit.  Start Topirimate 50 mg each night for Complicated Migraine prevention.  We will check an EEG for seizure activity.  They were counseled on what may be a reoccurrence of stroke symptoms vs. New symptoms, but when in doubt, told to go to ER.  Continue aspirin 81 mg orally every day and clopidogrel 75 mg orally every day  for  secondary stroke prevention and maintain strict control of hypertension with blood pressure goal below 140/90, and lipids with LDL cholesterol goal below 100 mg/dL.  Followup in the future in 2 months, sooner as needed.   Orders Placed This Encounter  Procedures  . EEG   Meds ordered this encounter  Medications  . topiramate (TOPAMAX) 50 MG tablet    Sig: Take 1 tablet (50 mg total) by mouth at bedtime.    Dispense:  30 tablet    Refill:  2    Order Specific Question:  Supervising Provider    Answer:     Return in about 2 months (around 04/05/2014). with Dr. [name].  Ronal Fear, MSN, NP-C 02/03/2014, 1:02 PM [facility] [address] [place], [place] [address] [phone]  Note: This document was prepared with digital dictation and possible smart phrase technology. Any transcriptional errors that result from this process are unintentional.

## 2014-02-08 ENCOUNTER — Other Ambulatory Visit (INDEPENDENT_AMBULATORY_CARE_PROVIDER_SITE_OTHER): Payer: Medicare Other

## 2014-02-08 DIAGNOSIS — I639 Cerebral infarction, unspecified: Secondary | ICD-10-CM

## 2014-02-08 DIAGNOSIS — R404 Transient alteration of awareness: Secondary | ICD-10-CM

## 2014-02-08 NOTE — Progress Notes (Signed)
I agree with above 

## 2014-02-10 ENCOUNTER — Other Ambulatory Visit: Payer: BC Managed Care – PPO | Admitting: Radiology

## 2014-02-10 NOTE — Progress Notes (Signed)
Quick Note:  Shared normal EEG results with patient's wife, verbalized understanding ______

## 2014-02-14 ENCOUNTER — Inpatient Hospital Stay: Payer: BC Managed Care – PPO | Admitting: Physical Medicine & Rehabilitation

## 2014-02-14 NOTE — Progress Notes (Signed)
I agree with above 

## 2014-02-18 ENCOUNTER — Inpatient Hospital Stay: Payer: BC Managed Care – PPO | Admitting: Physical Medicine & Rehabilitation

## 2014-02-18 ENCOUNTER — Encounter: Payer: BC Managed Care – PPO | Attending: Physical Medicine & Rehabilitation

## 2014-03-07 ENCOUNTER — Emergency Department (HOSPITAL_COMMUNITY): Payer: Medicare Other

## 2014-03-07 ENCOUNTER — Encounter (HOSPITAL_COMMUNITY): Payer: Self-pay | Admitting: Emergency Medicine

## 2014-03-07 ENCOUNTER — Observation Stay (HOSPITAL_COMMUNITY)
Admission: EM | Admit: 2014-03-07 | Discharge: 2014-03-10 | Disposition: A | Discharge status: Home / Self Care | Payer: Medicare Other | Attending: Internal Medicine | Admitting: Internal Medicine

## 2014-03-07 DIAGNOSIS — F411 Generalized anxiety disorder: Secondary | ICD-10-CM | POA: Diagnosis not present

## 2014-03-07 DIAGNOSIS — G894 Chronic pain syndrome: Secondary | ICD-10-CM

## 2014-03-07 DIAGNOSIS — I252 Old myocardial infarction: Secondary | ICD-10-CM | POA: Insufficient documentation

## 2014-03-07 DIAGNOSIS — G459 Transient cerebral ischemic attack, unspecified: Secondary | ICD-10-CM | POA: Diagnosis not present

## 2014-03-07 DIAGNOSIS — K219 Gastro-esophageal reflux disease without esophagitis: Secondary | ICD-10-CM

## 2014-03-07 DIAGNOSIS — Z9861 Coronary angioplasty status: Secondary | ICD-10-CM | POA: Insufficient documentation

## 2014-03-07 DIAGNOSIS — M542 Cervicalgia: Secondary | ICD-10-CM

## 2014-03-07 DIAGNOSIS — K449 Diaphragmatic hernia without obstruction or gangrene: Secondary | ICD-10-CM | POA: Insufficient documentation

## 2014-03-07 DIAGNOSIS — F32A Depression, unspecified: Secondary | ICD-10-CM

## 2014-03-07 DIAGNOSIS — Z87891 Personal history of nicotine dependence: Secondary | ICD-10-CM | POA: Insufficient documentation

## 2014-03-07 DIAGNOSIS — Z79899 Other long term (current) drug therapy: Secondary | ICD-10-CM | POA: Insufficient documentation

## 2014-03-07 DIAGNOSIS — F1021 Alcohol dependence, in remission: Secondary | ICD-10-CM | POA: Diagnosis not present

## 2014-03-07 DIAGNOSIS — R259 Unspecified abnormal involuntary movements: Secondary | ICD-10-CM | POA: Insufficient documentation

## 2014-03-07 DIAGNOSIS — I10 Essential (primary) hypertension: Secondary | ICD-10-CM | POA: Diagnosis not present

## 2014-03-07 DIAGNOSIS — F329 Major depressive disorder, single episode, unspecified: Secondary | ICD-10-CM | POA: Diagnosis not present

## 2014-03-07 DIAGNOSIS — Z7902 Long term (current) use of antithrombotics/antiplatelets: Secondary | ICD-10-CM | POA: Insufficient documentation

## 2014-03-07 DIAGNOSIS — I25119 Atherosclerotic heart disease of native coronary artery with unspecified angina pectoris: Secondary | ICD-10-CM

## 2014-03-07 DIAGNOSIS — R61 Generalized hyperhidrosis: Secondary | ICD-10-CM | POA: Insufficient documentation

## 2014-03-07 DIAGNOSIS — E162 Hypoglycemia, unspecified: Secondary | ICD-10-CM

## 2014-03-07 DIAGNOSIS — I519 Heart disease, unspecified: Secondary | ICD-10-CM | POA: Insufficient documentation

## 2014-03-07 DIAGNOSIS — R413 Other amnesia: Secondary | ICD-10-CM

## 2014-03-07 DIAGNOSIS — I70219 Atherosclerosis of native arteries of extremities with intermittent claudication, unspecified extremity: Secondary | ICD-10-CM | POA: Diagnosis not present

## 2014-03-07 DIAGNOSIS — R202 Paresthesia of skin: Secondary | ICD-10-CM

## 2014-03-07 DIAGNOSIS — R0609 Other forms of dyspnea: Secondary | ICD-10-CM | POA: Diagnosis not present

## 2014-03-07 DIAGNOSIS — M2550 Pain in unspecified joint: Secondary | ICD-10-CM

## 2014-03-07 DIAGNOSIS — G939 Disorder of brain, unspecified: Secondary | ICD-10-CM

## 2014-03-07 DIAGNOSIS — Z9884 Bariatric surgery status: Secondary | ICD-10-CM | POA: Diagnosis not present

## 2014-03-07 DIAGNOSIS — I679 Cerebrovascular disease, unspecified: Secondary | ICD-10-CM

## 2014-03-07 DIAGNOSIS — R0989 Other specified symptoms and signs involving the circulatory and respiratory systems: Secondary | ICD-10-CM | POA: Insufficient documentation

## 2014-03-07 DIAGNOSIS — M159 Polyosteoarthritis, unspecified: Secondary | ICD-10-CM

## 2014-03-07 DIAGNOSIS — R079 Chest pain, unspecified: Secondary | ICD-10-CM

## 2014-03-07 DIAGNOSIS — Z125 Encounter for screening for malignant neoplasm of prostate: Secondary | ICD-10-CM

## 2014-03-07 DIAGNOSIS — K8020 Calculus of gallbladder without cholecystitis without obstruction: Secondary | ICD-10-CM | POA: Insufficient documentation

## 2014-03-07 DIAGNOSIS — K4020 Bilateral inguinal hernia, without obstruction or gangrene, not specified as recurrent: Secondary | ICD-10-CM | POA: Diagnosis not present

## 2014-03-07 DIAGNOSIS — Z7982 Long term (current) use of aspirin: Secondary | ICD-10-CM | POA: Insufficient documentation

## 2014-03-07 DIAGNOSIS — E785 Hyperlipidemia, unspecified: Secondary | ICD-10-CM | POA: Diagnosis not present

## 2014-03-07 DIAGNOSIS — I2510 Atherosclerotic heart disease of native coronary artery without angina pectoris: Secondary | ICD-10-CM | POA: Diagnosis not present

## 2014-03-07 DIAGNOSIS — I739 Peripheral vascular disease, unspecified: Secondary | ICD-10-CM

## 2014-03-07 DIAGNOSIS — I69992 Facial weakness following unspecified cerebrovascular disease: Secondary | ICD-10-CM | POA: Insufficient documentation

## 2014-03-07 DIAGNOSIS — I6330 Cerebral infarction due to thrombosis of unspecified cerebral artery: Secondary | ICD-10-CM

## 2014-03-07 DIAGNOSIS — R072 Precordial pain: Secondary | ICD-10-CM

## 2014-03-07 DIAGNOSIS — I639 Cerebral infarction, unspecified: Secondary | ICD-10-CM | POA: Diagnosis present

## 2014-03-07 DIAGNOSIS — R569 Unspecified convulsions: Secondary | ICD-10-CM | POA: Insufficient documentation

## 2014-03-07 DIAGNOSIS — F3289 Other specified depressive episodes: Secondary | ICD-10-CM | POA: Diagnosis not present

## 2014-03-07 DIAGNOSIS — R29898 Other symptoms and signs involving the musculoskeletal system: Secondary | ICD-10-CM | POA: Diagnosis present

## 2014-03-07 DIAGNOSIS — F410 Panic disorder [episodic paroxysmal anxiety] without agoraphobia: Secondary | ICD-10-CM

## 2014-03-07 DIAGNOSIS — R200 Anesthesia of skin: Secondary | ICD-10-CM

## 2014-03-07 DIAGNOSIS — I6782 Cerebral ischemia: Secondary | ICD-10-CM

## 2014-03-07 LAB — COMPREHENSIVE METABOLIC PANEL
ALT: 20 U/L (ref 0–53)
AST: 24 U/L (ref 0–37)
Albumin: 3.9 g/dL (ref 3.5–5.2)
Alkaline Phosphatase: 86 U/L (ref 39–117)
Anion gap: 14 (ref 5–15)
BUN: 14 mg/dL (ref 6–23)
CALCIUM: 9 mg/dL (ref 8.4–10.5)
CHLORIDE: 100 meq/L (ref 96–112)
CO2: 25 mEq/L (ref 19–32)
CREATININE: 1.08 mg/dL (ref 0.50–1.35)
GFR, EST AFRICAN AMERICAN: 84 mL/min — AB (ref >90)
GFR, EST NON AFRICAN AMERICAN: 72 mL/min — AB (ref >90)
GLUCOSE: 94 mg/dL (ref 70–99)
Potassium: 4.4 mEq/L (ref 3.7–5.3)
Sodium: 139 mEq/L (ref 137–147)
Total Bilirubin: 0.3 mg/dL (ref 0.3–1.2)
Total Protein: 6.9 g/dL (ref 6.0–8.3)

## 2014-03-07 LAB — CBC
HCT: 40.8 % (ref 39.0–52.0)
HEMOGLOBIN: 14.1 g/dL (ref 13.0–17.0)
MCH: 29.7 pg (ref 26.0–34.0)
MCHC: 34.6 g/dL (ref 30.0–36.0)
MCV: 85.9 fL (ref 78.0–100.0)
Platelets: 205 10*3/uL (ref 150–400)
RBC: 4.75 MIL/uL (ref 4.22–5.81)
RDW: 13.9 % (ref 11.5–15.5)
WBC: 9.5 10*3/uL (ref 4.0–10.5)

## 2014-03-07 LAB — I-STAT CHEM 8, ED
BUN: 13 mg/dL (ref 6–23)
Calcium, Ion: 1.11 mmol/L — ABNORMAL LOW (ref 1.13–1.30)
Chloride: 103 mEq/L (ref 96–112)
Creatinine, Ser: 1.2 mg/dL (ref 0.50–1.35)
Glucose, Bld: 95 mg/dL (ref 70–99)
HEMATOCRIT: 43 % (ref 39.0–52.0)
Hemoglobin: 14.6 g/dL (ref 13.0–17.0)
POTASSIUM: 4.1 meq/L (ref 3.7–5.3)
Sodium: 137 mEq/L (ref 137–147)
TCO2: 24 mmol/L (ref 0–100)

## 2014-03-07 LAB — DIFFERENTIAL
Basophils Absolute: 0.1 10*3/uL (ref 0.0–0.1)
Basophils Relative: 1 % (ref 0–1)
EOS PCT: 5 % (ref 0–5)
Eosinophils Absolute: 0.5 10*3/uL (ref 0.0–0.7)
LYMPHS ABS: 2 10*3/uL (ref 0.7–4.0)
Lymphocytes Relative: 20 % (ref 12–46)
Monocytes Absolute: 0.8 10*3/uL (ref 0.1–1.0)
Monocytes Relative: 9 % (ref 3–12)
Neutro Abs: 6.2 10*3/uL (ref 1.7–7.7)
Neutrophils Relative %: 65 % (ref 43–77)

## 2014-03-07 LAB — PROTIME-INR
INR: 1.03 (ref 0.00–1.49)
Prothrombin Time: 13.5 seconds (ref 11.6–15.2)

## 2014-03-07 LAB — URINALYSIS, ROUTINE W REFLEX MICROSCOPIC
BILIRUBIN URINE: NEGATIVE
Glucose, UA: NEGATIVE mg/dL
Hgb urine dipstick: NEGATIVE
Ketones, ur: NEGATIVE mg/dL
Leukocytes, UA: NEGATIVE
NITRITE: NEGATIVE
Protein, ur: NEGATIVE mg/dL
SPECIFIC GRAVITY, URINE: 1.028 (ref 1.005–1.030)
UROBILINOGEN UA: 1 mg/dL (ref 0.0–1.0)
pH: 5.5 (ref 5.0–8.0)

## 2014-03-07 LAB — RAPID URINE DRUG SCREEN, HOSP PERFORMED
Amphetamines: NOT DETECTED
BARBITURATES: NOT DETECTED
Benzodiazepines: NOT DETECTED
COCAINE: NOT DETECTED
OPIATES: POSITIVE — AB
Tetrahydrocannabinol: NOT DETECTED

## 2014-03-07 LAB — APTT: aPTT: 30 seconds (ref 24–37)

## 2014-03-07 LAB — TROPONIN I: Troponin I: <0.3 ng/mL (ref <0.30)

## 2014-03-07 LAB — I-STAT TROPONIN, ED: Troponin i, poc: 0 ng/mL (ref 0.00–0.08)

## 2014-03-07 LAB — ETHANOL: Alcohol, Ethyl (B): <11 mg/dL (ref 0–11)

## 2014-03-07 MED ORDER — ONE-A-DAY MENS VITACRAVES PO CHEW: 1.0000 | CHEWABLE_TABLET | Freq: Every morning | ORAL | Status: DC

## 2014-03-07 MED ORDER — PAROXETINE HCL 20 MG PO TABS
20.0000 mg | ORAL_TABLET | Freq: Every day | ORAL | Status: DC
  Administered 2014-03-08 – 2014-03-10 (×3): 20 mg via ORAL
  Filled 2014-03-07 (×3): qty 1

## 2014-03-07 MED ORDER — SENNOSIDES-DOCUSATE SODIUM 8.6-50 MG PO TABS
1.0000 | ORAL_TABLET | Freq: Every evening | ORAL | Status: DC | PRN
  Filled 2014-03-07: qty 1

## 2014-03-07 MED ORDER — ADULT MULTIVITAMIN W/MINERALS CH
1.0000 | ORAL_TABLET | Freq: Every day | ORAL | Status: DC
  Administered 2014-03-08 – 2014-03-10 (×2): 1 via ORAL
  Filled 2014-03-07 (×2): qty 1

## 2014-03-07 MED ORDER — OMEGA-3-ACID ETHYL ESTERS 1 G PO CAPS
1.0000 g | ORAL_CAPSULE | Freq: Every day | ORAL | Status: DC
  Administered 2014-03-08 – 2014-03-10 (×3): 1 g via ORAL
  Filled 2014-03-07 (×3): qty 1

## 2014-03-07 MED ORDER — CLOPIDOGREL BISULFATE 75 MG PO TABS
75.0000 mg | ORAL_TABLET | Freq: Every day | ORAL | Status: DC
  Administered 2014-03-07 – 2014-03-10 (×4): 75 mg via ORAL
  Filled 2014-03-07 (×4): qty 1

## 2014-03-07 MED ORDER — LISINOPRIL 5 MG PO TABS
5.0000 mg | ORAL_TABLET | Freq: Every day | ORAL | Status: DC
  Administered 2014-03-07 – 2014-03-10 (×4): 5 mg via ORAL
  Filled 2014-03-07 (×4): qty 1

## 2014-03-07 MED ORDER — LORAZEPAM 2 MG/ML IJ SOLN: 0.5000 mg | Freq: Once | INTRAMUSCULAR | Status: DC

## 2014-03-07 MED ORDER — CYCLOBENZAPRINE HCL 10 MG PO TABS
5.0000 mg | ORAL_TABLET | Freq: Two times a day (BID) | ORAL | Status: DC | PRN
  Administered 2014-03-07 – 2014-03-09 (×3): 5 mg via ORAL
  Filled 2014-03-07 (×3): qty 1

## 2014-03-07 MED ORDER — IOHEXOL 350 MG/ML SOLN
100.0000 mL | Freq: Once | INTRAVENOUS | Status: AC | PRN
  Administered 2014-03-07: 100 mL via INTRAVENOUS

## 2014-03-07 MED ORDER — LORAZEPAM 2 MG/ML IJ SOLN
0.5000 mg | Freq: Once | INTRAMUSCULAR | Status: AC
  Administered 2014-03-07: 0.5 mg via INTRAVENOUS
  Filled 2014-03-07: qty 1

## 2014-03-07 MED ORDER — OXYCODONE-ACETAMINOPHEN 5-325 MG PO TABS
1.0000 | ORAL_TABLET | Freq: Four times a day (QID) | ORAL | Status: DC | PRN
  Administered 2014-03-07 – 2014-03-09 (×7): 1 via ORAL
  Filled 2014-03-07 (×9): qty 1

## 2014-03-07 MED ORDER — ASPIRIN EC 81 MG PO TBEC
81.0000 mg | DELAYED_RELEASE_TABLET | Freq: Every day | ORAL | Status: DC
  Administered 2014-03-07 – 2014-03-10 (×3): 81 mg via ORAL
  Filled 2014-03-07 (×4): qty 1

## 2014-03-07 MED ORDER — NITROGLYCERIN 0.4 MG SL SUBL: 0.4000 mg | SUBLINGUAL_TABLET | SUBLINGUAL | Status: DC | PRN

## 2014-03-07 MED ORDER — FAMOTIDINE 20 MG PO TABS
20.0000 mg | ORAL_TABLET | Freq: Two times a day (BID) | ORAL | Status: DC
  Administered 2014-03-07 – 2014-03-10 (×6): 20 mg via ORAL
  Filled 2014-03-07 (×6): qty 1

## 2014-03-07 MED ORDER — ENOXAPARIN SODIUM 40 MG/0.4ML ~~LOC~~ SOLN
40.0000 mg | SUBCUTANEOUS | Status: DC
  Administered 2014-03-07 – 2014-03-09 (×3): 40 mg via SUBCUTANEOUS
  Filled 2014-03-07 (×4): qty 0.4

## 2014-03-07 MED ORDER — STROKE: EARLY STAGES OF RECOVERY BOOK
Freq: Once | Status: AC
  Administered 2014-03-08: 05:00:00
  Filled 2014-03-07 (×2): qty 1

## 2014-03-07 NOTE — ED Provider Notes (Signed)
CSN: [id number]     Arrival date & time 03/07/14  1950 History   First MD Initiated Contact with Patient 03/07/14 2003     Chief Complaint  Patient presents with  . Chest Pain  . Tremors     (Consider location/radiation/quality/duration/timing/severity/associated sxs/prior Treatment) HPI Comments: Patient presents with generalized tremors that he cannot control that onset one hour ago. He was sitting on a couch when he went to take a shower and began to feel anxious and tremulous. He had weakness and difficulty using his right arm with dizziness. He's had multiple TIAs and MIs in the past. After EMS arrival, he developed chest pain that is central and radiates to his left neck and left arm. This is been ongoing for the past 30 minutes and improved somewhat with nitroglycerin and aspirin. This feels similar to his previous MI. He denies any abdominal pain or back pain. He denies any fever or cough. Code stroke called on arrival for right arm weakness with pronator drift  The history is provided by the patient and the EMS personnel. The history is limited by the condition of the patient.    Past Medical History  Diagnosis Date  . Peripheral arterial disease   . Hyperlipidemia   . Coronary artery disease   . Hypertension   . Barrett's esophagus   . Gastroesophageal reflux   . Cerebrovascular disease   . Myocardial infarction 2009    "during cardiac cath" (01/27/2013)  . H/O hiatal hernia   . WUJWJXBJ(478.2)     "probably monthly" (01/27/2013)  . Seizures     "when I had my heart attack in 2009" (01/27/2013)  . 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 Arthritis     "hands" (01/27/2013)   Past Surgical History  Procedure Laterality Date  . Laparoscopic gastric banding  2010  . Cardiac catheterization    . Coronary angioplasty with stent placement  11/02/2007    "2"  (01/27/2013)  . Laparoscopic nissen fundoplication  ?2001  . Femoral artery stent Right 01/26/2013  . Diagnostic laparoscopy      "twice after nissen; it kept coming apart " (01/27/2013)   Family History  Problem Relation Age of Onset  . Heart disease Mother     Heart Disease before age 72  . Hypertension Mother   . Heart attack Mother   . Heart attack Father    History  Substance Use Topics  . Smoking status: Former Smoker -- 2.00 packs/day for 35 years    Types: Cigarettes    Quit date: 01/28/2001  . Smokeless tobacco: Never Used  . Alcohol Use: No     Comment: 01/27/2013 "quit drinking in 2001; I'm a recovering alcoholic"    Review of Systems  Unable to perform ROS: Acuity of condition  Cardiovascular: Positive for chest pain.      Allergies  Review of patient's allergies indicates no known allergies.  Home Medications   Prior to Admission medications   Medication Sig Start Date End Date Taking? Authorizing Provider  aspirin EC 81 MG tablet Take 81 mg by mouth daily.   Yes Historical Provider, MD  clopidogrel (PLAVIX) 75 MG tablet Take 1 tablet (75 mg total) by mouth daily. 12/31/13  Yes Daniel J Angiulli, PA-C  cyclobenzaprine (FLEXERIL) 10 MG tablet Take 5 mg by mouth 2 (two) times daily as  needed for muscle spasms.  02/16/14  Yes Historical Provider, MD  fish oil-omega-3 fatty acids 1000 MG capsule Take 1 g by mouth 2 (two) times daily.    Yes Historical Provider, MD  lisinopril (PRINIVIL,ZESTRIL) 5 MG tablet Take 1 tablet (5 mg total) by mouth daily. 12/31/13  Yes Daniel J Angiulli, PA-C  Multiple Vitamins-Minerals (ONE-A-DAY MENS VITACRAVES PO) Take 1 tablet by mouth daily.    Yes Historical Provider, MD  oxyCODONE-acetaminophen (PERCOCET/ROXICET) 5-325 MG per tablet Take 1 tablet by mouth every 6 (six) hours as needed for moderate pain. 12/31/13  Yes Daniel J Angiulli, PA-C  PARoxetine (PAXIL) 20 MG tablet Take 1 tablet (20 mg total) by mouth every morning. 12/31/13  Yes  Daniel J Angiulli, PA-C  ranitidine (ZANTAC) 150 MG tablet Take 1 tablet (150 mg total) by mouth 2 (two) times daily. 12/31/13  Yes Daniel J Angiulli, PA-C  nitroGLYCERIN (NITROSTAT) 0.4 MG SL tablet Place 0.4 mg under the tongue every 5 (five) minutes as needed for chest pain.    Historical Provider, MD   BP 119/64  Pulse 68  Temp(Src) 97.8 F (36.6 C) (Oral)  Resp 16  Ht 5\' 10"  (1.778 m)  Wt 211 lb (95.709 kg)  BMI 30.28 kg/m2  SpO2 97% Physical Exam  Nursing note and vitals reviewed. Constitutional: He is oriented to person, place, and time. He appears well-developed and well-nourished. No distress.  Tearful, anxious, diffusely tremulous  HENT:  Head: Normocephalic and atraumatic.  Mouth/Throat: Oropharynx is clear and moist. No oropharyngeal exudate.  Eyes: Conjunctivae and EOM are normal. Pupils are equal, round, and reactive to light.  Neck: Normal range of motion. Neck supple.  No meningismus  Cardiovascular: Normal rate, regular rhythm and normal heart sounds.   No murmur heard. +2 femoral, DP, PT pulses  Pulmonary/Chest: Effort normal and breath sounds normal. No respiratory distress.  Abdominal: Soft. There is no tenderness. There is no rebound and no guarding.  Musculoskeletal: Normal range of motion. He exhibits no edema and no tenderness.  No calf swelling, tenderness, or palpable cords  Neurological: He is alert and oriented to person, place, and time. No cranial nerve deficit.  CN 2-12 intact.  4/5 strength RUE and pronator drift.  4/5 strength RLE, 5/5 strength LUE and LLE.  Skin: Skin is warm and dry. No rash noted.  Psychiatric: He has a normal mood and affect.    ED Course  Procedures (including critical care time) Labs Review Labs Reviewed  COMPREHENSIVE METABOLIC PANEL - Abnormal; Notable for the following:    GFR calc non Af Amer 72 (*)    GFR calc Af Amer 84 (*)    All other components within normal limits  URINE RAPID DRUG SCREEN (HOSP PERFORMED) -  Abnormal; Notable for the following:    Opiates POSITIVE (*)    All other components within normal limits  I-STAT CHEM 8, ED - Abnormal; Notable for the following:    Calcium, Ion 1.11 (*)    All other components within normal limits  ETHANOL  PROTIME-INR  APTT  CBC  DIFFERENTIAL  URINALYSIS, ROUTINE W REFLEX MICROSCOPIC  TROPONIN I  HEMOGLOBIN A1C  TROPONIN I  TROPONIN I  LIPID PANEL  I-STAT CHEM 8, ED  I-STAT TROPOININ, ED  Rosezena Sensor, ED    Imaging Review Dg Chest 1 View  03/07/2014   CLINICAL DATA:  Chest pain.  Shortness of breath.  Previous stroke.  EXAM: CHEST - 1 VIEW  COMPARISON:  12/27/2013  FINDINGS: The heart size and mediastinal contours are within normal limits. Both lungs are clear. The visualized skeletal structures are unremarkable.  IMPRESSION: No active disease.   Electronically Signed   By: Myles RosenthalJohn  Stahl M.D.   On: 03/07/2014 23:17   Ct Head Wo Contrast  03/07/2014   CLINICAL DATA:  Right arm and leg drift.  EXAM: CT HEAD WITHOUT CONTRAST  TECHNIQUE: Contiguous axial images were obtained from the base of the skull through the vertex without contrast.  COMPARISON:  MRI of the brain 01/14/2014.  CT head 01/14/2014.  FINDINGS: Normal appearance of the intracranial structures. No evidence for acute hemorrhage, mass lesion, midline shift, hydrocephalus or large infarct. No acute bony abnormality. The visualized sinuses are clear. Previous sinus disease has cleared.  IMPRESSION: No acute intracranial abnormality.  Stable appearance from priors.   Electronically Signed   By: Davonna BellingJohn  Curnes M.D.   On: 03/07/2014 21:05   Mr Brain Wo Contrast  03/07/2014   CLINICAL DATA:  Right-sided visual loss with blurry vision.  EXAM: MRI HEAD WITHOUT CONTRAST  TECHNIQUE: Multiplanar, multiecho pulse sequences of the brain and surrounding structures were obtained without intravenous contrast.  COMPARISON:  Prior CT from earlier the same day as well as prior MRI from 01/14/2014  FINDINGS:  Mild diffuse prominence of the CSF containing spaces is compatible with age-appropriate atrophy. Scattered T2/FLAIR subcentimeter hyperintensities within the periventricular and deep white matter both cerebral hemispheres are most consistent with mild chronic microvascular ischemic changes.  No mass lesion, midline shift, or extra-axial fluid collection. Ventricles are normal in size without evidence of hydrocephalus.  No diffusion-weighted signal abnormality is identified to suggest acute intracranial infarct. Gray-white matter differentiation is maintained. Normal flow voids are seen within the intracranial vasculature. No intracranial hemorrhage identified.  The cervicomedullary junction is normal. Pituitary gland is within normal limits. Pituitary stalk is midline. The globes and optic nerves demonstrate a normal appearance with normal signal intensity.  The bone marrow signal intensity is normal. Calvarium is intact. Visualized upper cervical spine is within normal limits.  Scalp soft tissues are unremarkable.  Mild T2 hyperintense mucosal thickening noted within the maxillary and ethmoidal sinuses bilaterally. Minimal mucosal thickening also present within the right frontal sinus. No mastoid effusion.  IMPRESSION: 1. Stable exam with no acute intracranial infarct or other abnormality. 2. Mild chronic microvascular ischemic changes.   Electronically Signed   By: Rise MuBenjamin  McClintock M.D.   On: 03/07/2014 22:44   Ct Angio Chest Aortic Dissect W &/or W/o  03/07/2014   CLINICAL DATA:  Chest pain.  EXAM: CT ANGIOGRAPHY CHEST, ABDOMEN AND PELVIS  TECHNIQUE: Multidetector CT imaging through the chest, abdomen and pelvis was performed using the standard protocol during bolus administration of intravenous contrast. Multiplanar reconstructed images and MIPs were obtained and reviewed to evaluate the vascular anatomy.  CONTRAST:  100mL OMNIPAQUE IOHEXOL 350 MG/ML SOLN  COMPARISON:  CT scan of December 27, 2013.  FINDINGS:  CTA CHEST FINDINGS  No pneumothorax or pleural effusion is noted. No acute pulmonary disease is noted. No mediastinal mass or adenopathy is noted mild coronary artery calcifications are noted. Moderate hiatal hernia is noted. There is no evidence of thoracic aortic dissection or aneurysm. 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 There is no evidence of pulmonary embolus.  Review of the MIP images confirms the above findings.  CTA ABDOMEN AND PELVIS FINDINGS  No focal abnormality is noted in the liver, spleen or pancreas. Cholelithiasis is noted. No  evidence of aneurysm or dissection is seen involving the abdominal aorta. Atherosclerotic calcifications are noted in the abdominal aorta and iliac arteries. The mesenteric and renal arteries are widely patent without significant stenosis. Adrenal glands and kidneys appear normal. No hydronephrosis or renal obstruction is noted. No evidence of bowel obstruction is noted. The appendix appears normal. Urinary bladder appears normal. Stable mild bilateral fat containing inguinal hernias are noted.  Review of the MIP images confirms the above findings.  IMPRESSION: No evidence of thoracic or abdominal aortic aneurysm or dissection.  Stable hiatal hernia compared to prior exam.  Cholelithiasis.  Stable mild bilateral fat containing inguinal hernias.   Electronically Signed   By: Roque Lias M.D.   On: 03/07/2014 21:17   Ct Angio Abd/pel W/ And/or W/o  03/07/2014   CLINICAL DATA:  Chest pain.  EXAM: CT ANGIOGRAPHY CHEST, ABDOMEN AND PELVIS  TECHNIQUE: Multidetector CT imaging through the chest, abdomen and pelvis was performed using the standard protocol during bolus administration of intravenous contrast. Multiplanar reconstructed images and MIPs were obtained and reviewed to evaluate the vascular anatomy.  CONTRAST:  OMNIPAQUE IOHEXOL 350 MG/ML SOLN  COMPARISON:  CT scan of December 27, 2013.  FINDINGS: CTA CHEST FINDINGS  No pneumothorax  or pleural effusion is noted. No acute pulmonary disease is noted. No mediastinal mass or adenopathy is noted mild coronary artery calcifications are noted. Moderate hiatal hernia is noted. There is no evidence of thoracic aortic dissection or aneurysm. 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 There is no evidence of pulmonary embolus.  Review of the MIP images confirms the above findings.  CTA ABDOMEN AND PELVIS FINDINGS  No focal abnormality is noted in the liver, spleen or pancreas. Cholelithiasis is noted. No evidence of aneurysm or dissection is seen involving the abdominal aorta. Atherosclerotic calcifications are noted in the abdominal aorta and iliac arteries. The mesenteric and renal arteries are widely patent without significant stenosis. Adrenal glands and kidneys appear normal. No hydronephrosis or renal obstruction is noted. No evidence of bowel obstruction is noted. The appendix appears normal. Urinary bladder appears normal. Stable mild bilateral fat containing inguinal hernias are noted.  Review of the MIP images confirms the above findings.  IMPRESSION: No evidence of thoracic or abdominal aortic aneurysm or dissection.  Stable hiatal hernia compared to prior exam.  Cholelithiasis.  Stable mild bilateral fat containing inguinal hernias.   Electronically Signed   By: Roque Lias M.D.   On: 03/07/2014 21:17     EKG Interpretation   Date/Time:  Monday March 07 2014 19:59:00 EDT Ventricular Rate:  69 PR Interval:  151 QRS Duration: 95 QT Interval:  392 QTC Calculation: 420 R Axis:   35 Text Interpretation:  Sinus rhythm RSR' in V1 or V2, right VCD or RVH No  significant change was found Confirmed by Manus Gunning  MD, Lin Hackmann [phone]) on  03/07/2014 8:13:41 PM      MDM   Final diagnoses:  Transient cerebral ischemia, unspecified transient cerebral ischemia type  Chest pain, unspecified chest pain type   Patient with acute onset of tremors, right arm  weakness, pronator drift and leg weakness. Followed by chest pain similar to previous MI. Code stroke called on arrival. Also concern for aortic dissection.  EKG unchanged.  CT head negative. CTA negative for dissection or other pathology.  Dr. Leroy Kennedy has seen patient.  NIHSS 2.  He is not a candidate for tPA given minor deficits and the fact that he  received tPA in April 2015. He recommends repeat MRI.  Admission d/w Dr. Alvester MorinNewton.  Cardiology consult requested.  cath 10/2007 showing patent stent to circumflex obtuse marginal brach and new lesions (30%) in RCA and LAD. Troponin negative x1. Cardiology fellow will see.  CRITICAL CARE Performed by: Glynn OctaveANCOUR, Cherolyn Behrle Total critical care time: 30 Critical care time was exclusive of separately billable procedures and treating other patients. Critical care was necessary to treat or prevent imminent or life-threatening deterioration. Critical care was time spent personally by me on the following activities: development of treatment plan with patient and/or surrogate as well as nursing, discussions with consultants, evaluation of patient's response to treatment, examination of patient, obtaining history from patient or surrogate, ordering and performing treatments and interventions, ordering and review of laboratory studies, ordering and review of radiographic studies, pulse oximetry and re-evaluation of patient's condition.   Glynn OctaveStephen Fayelynn Distel, MD 03/08/14 [phone]

## 2014-03-07 NOTE — ED Notes (Signed)
Pt in MRI at this time. $ [employer] RN made aware that neuro check needs to be completed.

## 2014-03-07 NOTE — ED Notes (Signed)
Pt presents from home with c/o generalized tremors, central chest pain, left arm pain and neck pain. Pt reports hx atherosclerosis, significant family history of MI. Pt reports increased stress and anxiety recently and is tearful during initial assessment. Dr. Manus Gunningancour at bedside.

## 2014-03-07 NOTE — Consult Note (Addendum)
Referring Physician: ED    Chief Complaint: CODE STROKE: right arm weakness-numbness, chest pain, tremors  HPI:                                                                                                                                         Justin Wells is an 63 y.o. male with a past medical history significant for HTN, hyperlipidemia, CAD s/p stent deployment x 3, MI, PAD s/p femoral stenting, TIA's, ischemic stroke in the setting of acute MI 2014, anxiety, brought to [facility] ED by ambulance as a code stroke due to acute onset of the above stated symptoms. Wife said that Justin Wells was home sitting in a couch when suddenly started trembling, became sweaty, clammy, and then his right side was weak and numb and this EMS was summoned. He developed chest pain and shortness of breath on route. NIHSS 2 on initial assessment. CT brain showed no acute abnormality. CTA chest negative. He is on plavix. Patient reports a lot of anxiety but denies HA, vertigo, double vision, difficulty swallowing, slurred speech, or language impairment. [place] Spaniel that he can not see on the right eye and still c/o chest pain Date last known well: 03/07/14 Time last known well: 1900 tPA Given: no, mild deficits, recent IV tpa/? Stroke 4/15 NIHSS: 2   Past Medical History  Diagnosis Date  . Peripheral arterial disease   . Hyperlipidemia   . Coronary artery disease   . Hypertension   . Barrett's esophagus   . Gastroesophageal reflux   . Cerebrovascular disease   . Myocardial infarction 2009    "during cardiac cath" (01/27/2013)  . H/O hiatal hernia   . ZOXWRUEA(540.9)     "probably monthly" (01/27/2013)  . Seizures     "when I had my heart attack in 2009" (01/27/2013)  . 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 Arthritis     "hands" (01/27/2013)    Past Surgical History  Procedure Laterality Date   . Laparoscopic gastric banding  2010  . Cardiac catheterization    . Coronary angioplasty with stent placement  11/02/2007    "2" (01/27/2013)  . Laparoscopic nissen fundoplication  ?2001  . Femoral artery stent Right 01/26/2013  . Diagnostic laparoscopy      "twice after nissen; it kept coming apart " (01/27/2013)    Family History  Problem Relation Age of Onset  . Heart disease Mother     Heart Disease before age 35  . Hypertension Mother   . Heart attack Mother   . Heart attack Father    Social History:  reports that he quit smoking about 13 years ago. His smoking use included Cigarettes. He has a 70 pack-year smoking history. He has never used smokeless tobacco.  He reports that he does not drink alcohol or use illicit drugs.  Allergies: No Known Allergies  Medications:                                                                                                                           I have reviewed the patient's current medications.  ROS:                                                                                                                                       History obtained from the patient and chart review.  General ROS: negative for - chills, fatigue, fever, night sweats, weight gain or weight loss Psychological ROS: negative for - behavioral disorder, hallucinations, memory difficulties, or suicidal ideation Ophthalmic ROS: negative for - blurry vision, double vision, eye pain or loss of vision ENT ROS: negative for - epistaxis, nasal discharge, oral lesions, sore throat, tinnitus or vertigo Allergy and Immunology ROS: negative for - hives or itchy/watery eyes Hematological and Lymphatic ROS: negative for - bleeding problems, bruising or swollen lymph nodes Endocrine ROS: negative for - galactorrhea, hair pattern changes, polydipsia/polyuria or temperature intolerance Respiratory ROS: negative for - cough, hemoptysis, shortness of breath or  wheezing Cardiovascular ROS: negative for - edema or irregular heartbeat Gastrointestinal ROS: negative for - abdominal pain, diarrhea, hematemesis, nausea/vomiting or stool incontinence Genito-Urinary ROS: negative for - dysuria, hematuria, incontinence or urinary frequency/urgency Musculoskeletal ROS: negative for - joint swelling Neurological ROS: as noted in HPI Dermatological ROS: negative for rash and skin lesion changes  Physical exam: pleasant male in no apparent distress. Blood pressure 153/77, pulse 81, temperature 98.9 F (37.2 C), temperature source Oral, resp. rate 24, height 5\' 10"  (1.778 m), weight 95.709 kg (211 lb), SpO2 96.00%. Head: normocephalic. Neck: supple, no bruits, no JVD. Cardiac: no murmurs. Lungs: clear. Abdomen: soft, no tender, no mass. Extremities: no edema. Neurologic Examination:  Mental Status: Alert, oriented, thought content appropriate.  Speech fluent without evidence of aphasia.  Able to follow 3 step commands without difficulty. Cranial Nerves: II: Discs flat bilaterally; ?right visual field cut, pupils equal, round, reactive to light and accommodation III,IV, VI: ptosis not present, extra-ocular motions intact bilaterally V,VII: smile symmetric, facial light touch sensation normal bilaterally VIII: hearing normal bilaterally IX,X: gag reflex present XI: bilateral shoulder shrug XII: midline tongue extension without atrophy or fasciculations Motor: Significant for mild drift right eye. Of note, the previously described right arm drift on initial NIHSS is not present at this moment. Tone and bulk:normal tone throughout; no atrophy noted Sensory: Pinprick and light touch intact throughout, bilaterally Deep Tendon Reflexes:  Right: Upper Extremity   Left: Upper extremity   biceps (C-5 to C-6) 2/4   biceps (C-5 to C-6) 2/4 tricep (C7)  2/4    triceps (C7) 2/4 Brachioradialis (C6) 2/4  Brachioradialis (C6) 2/4  Lower Extremity Lower Extremity  quadriceps (L-2 to L-4) 2/4   quadriceps (L-2 to L-4) 2/4 Achilles (S1) 2/4   Achilles (S1) 2/4  Plantars: Right: downgoing   Left: downgoing Cerebellar: normal finger-to-nose,  normal heel-to-shin test. Generalized tremors at rest. Gait: No tested.  Results for orders placed during the hospital encounter of 03/07/14 (from the past 48 hour(s))  CBC     Status: None   Collection Time    03/07/14  8:16 PM      Result Value Ref Range   WBC 9.5  4.0 - 10.5 K/uL   RBC 4.75  4.22 - 5.81 MIL/uL   Hemoglobin 14.1  13.0 - 17.0 g/dL   HCT 16.140.8  09.639.0 - 04.552.0 %   MCV 85.9  78.0 - 100.0 fL   MCH 29.7  26.0 - 34.0 pg   MCHC 34.6  30.0 - 36.0 g/dL   RDW 40.913.9  81.111.5 - 91.415.5 %   Platelets 205  150 - 400 K/uL  DIFFERENTIAL     Status: None   Collection Time    03/07/14  8:16 PM      Result Value Ref Range   Neutrophils Relative % 65  43 - 77 %   Neutro Abs 6.2  1.7 - 7.7 K/uL   Lymphocytes Relative 20  12 - 46 %   Lymphs Abs 2.0  0.7 - 4.0 K/uL   Monocytes Relative 9  3 - 12 %   Monocytes Absolute 0.8  0.1 - 1.0 K/uL   Eosinophils Relative 5  0 - 5 %   Eosinophils Absolute 0.5  0.0 - 0.7 K/uL   Basophils Relative 1  0 - 1 %   Basophils Absolute 0.1  0.0 - 0.1 K/uL  I-STAT TROPOININ, ED     Status: None   Collection Time    03/07/14  8:23 PM      Result Value Ref Range   Troponin i, poc 0.00  0.00 - 0.08 ng/mL   Comment 3            Comment: Due to the release kinetics of cTnI,     a negative result within the first hours     of the onset of symptoms does not rule out     myocardial infarction with certainty.     If myocardial infarction is still suspected,     repeat the test at appropriate intervals.  I-STAT CHEM 8, ED     Status: Abnormal   Collection Time    03/07/14  8:25 PM      Result Value Ref Range   Sodium 137  137 - 147 mEq/L   Potassium 4.1  3.7 - 5.3 mEq/L    Chloride 103  96 - 112 mEq/L   BUN 13  6 - 23 mg/dL   Creatinine, Ser 1.611.20  0.50 - 1.35 mg/dL   Glucose, Bld 95  70 - 99 mg/dL   Calcium, Ion 0.961.11 (*) 1.13 - 1.30 mmol/L   TCO2 24  0 - 100 mmol/L   Hemoglobin 14.6  13.0 - 17.0 g/dL   HCT 04.543.0  40.939.0 - 81.152.0 %   No results found.   Assessment: 63 y.o. male with new onset generalized tremors with right sided weakness-numbness, chest pain, SOB. NIHSS 2 but inconsistent exam. Generalized tremors at rest that don't follow a clear pattern. CT brain negative.CTA chest negative. Low NIHSS, inconsistencies on neuro-exam, significant anxiety, IV thrombolysis 4/15, thus will not consider thrombolytic therapy. Abrupt onset generalized tremor that appears to have a non structural etiology. Had recent extensive stroke work up and no need to redo at this time. Recommend admission to medicine and MRI brain. Continue plavix. Will follow up.  Wyatt Portelasvaldo Camilo, MD [facility] [phone]  03/07/2014, 8:39 PM

## 2014-03-07 NOTE — H&P (Signed)
Hospitalist Admission History and Physical  Patient name: Justin Wells Medical record number: [id number] Date of birth: [birth year] Age: 63 y.o. Gender: male  Primary Care Provider: [name],[name], MD  Chief Complaint: R arm weakness/numbness, chest pain, tremors  History of Present Illness:This is a 63 y.o. year old male with significant past medical history of CAD s/p stenting, PVD s/p femoral stenting,  Hx/o TIA/CVA s/p tPa 4/15, seizures, depression presenting with R arm weakness, chest pain, tremors. Per report, pt was noticed by wife to start suddenly trembling while sitting on the couch in addition to becoming diaphoretic with weakness/numbness on R side. EMS called by wife. Pt brought to ER as code stroke. Pt reports that he developed some CP, dyspnea, diaphoresid while en route to ER. Was given SL NTG x2 with minimal to mild improvement in sxs. 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 However, sxs were somewhat dissimilar to previous episodes apart from CP. Was previously on seizure medication. However, this was d/c'd years ago per pt. No LOC assd with tremor at home. Denies ETOH abuse.  Presented to ER hemodynamically stable. Afebrile. Labs WNL. Trop and EKG WNL. CT angio of chest and abd ordered to r/o dissection, which was negative. Head CT WNL. Code stroke called. Neuro assessed. Not a tPa candidate as he has had this in the past. NIHSS 2. Recs were for repeat MRI.   Assessment and Plan: Justin ChimesGeorge F Mui is a 63 y.o. year old male presenting with R arm weakness/numbness, CP, tremors   R arm weakness/numbness, tremors: some concern for TIA vs. CVA recurrence. Appreciate neuro recs. MRI pending. Continue ASA and plavix. May need adjustment of SSRI if sxs psych related. Also check EEG.   CP: Fairly extensive cardiac history in the past. Had cath 10/2007 showing patent stent to circumflex obtuse marginal brach and new lesions (30%) in RCA and LAD. Trop and  EKG WNL thus far. Cycle CEs. Risk stratification labs. Continue ASA and plavix. Prn NTG. Follow up on cards recs.   HTN: BP stable. Continue home regimen.   FEN/GI: heart healthy diet.  Prophylaxis: lovenox.  Disposition: pending further evaluation  Code Status:Full Code    Patient Active Problem List   Diagnosis Date Noted  . TIA (transient ischemic attack) 03/07/2014  . Burning or prickling sensation 01/06/2014  . Temporary cerebral vascular dysfunction 01/06/2014  . CVA (cerebral infarction) 12/29/2013  . Hypertension 12/28/2013  . Other and unspecified hyperlipidemia 12/28/2013  . Brainstem infarct not seen on MRI 12/27/2013  . Bad memory 11/18/2013  . Special screening for malignant neoplasm of prostate 11/18/2013  . Ache in joint 07/12/2013  . Clinical depression 03/09/2013  . Generalized OA 03/09/2013  . Numbness and tingling 01/12/2013  . Peripheral vascular disease, unspecified 01/12/2013  . Pain in limb 01/12/2013  . Atherosclerosis of native arteries of the extremities with intermittent claudication 01/12/2013  . Hypoglycemia 11/05/2012  . Atherosclerosis of coronary artery 07/04/2011  . Artery disease, cerebral 07/04/2011  . Acid reflux 05/31/2010   Past Medical History: Past Medical History  Diagnosis Date  . Peripheral arterial disease   . Hyperlipidemia   . Coronary artery disease   . Hypertension   . Barrett's esophagus   . Gastroesophageal reflux   . Cerebrovascular disease   . Myocardial infarction 2009    "during cardiac cath" (01/27/2013)  . H/O hiatal hernia   . WUJWJXBJ(478.2Headache(784.0)     "probably monthly" (01/27/2013)  .  Seizures     "when I had my heart attack in 2009" (01/27/2013)  . 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 Arthritis     "hands" (01/27/2013)    Past Surgical History: Past Surgical History  Procedure Laterality Date   . Laparoscopic gastric banding  2010  . Cardiac catheterization    . Coronary angioplasty with stent placement  11/02/2007    "2" (01/27/2013)  . Laparoscopic nissen fundoplication  ?2001  . Femoral artery stent Right 01/26/2013  . Diagnostic laparoscopy      "twice after nissen; it kept coming apart " (01/27/2013)    Social History: History   Social History  . Marital Status: Married    Spouse Name: sheryl    Number of Children: 3  . Years of Education: 11th   Occupational History  . WELDER    Social History Main Topics  . Smoking status: Former Smoker -- 2.00 packs/day for 35 years    Types: Cigarettes    Quit date: 01/28/2001  . Smokeless tobacco: Never Used  . Alcohol Use: No     Comment: 01/27/2013 "quit drinking in 2001; I'm a recovering alcoholic"  . Drug Use: No  . Sexual Activity: Not Currently   Other Topics Concern  . None   Social History Narrative   Patient lives with his wife, patient drinks coffee     Family History: Family History  Problem Relation Age of Onset  . Heart disease Mother     Heart Disease before age 64  . Hypertension Mother   . Heart attack Mother   . Heart attack Father     Allergies: No Known Allergies  Current Facility-Administered Medications  Medication Dose Route Frequency Provider Last Rate Last Dose  .  stroke: mapping our early stages of recovery book   Does not apply Once Doree Albee, MD      . aspirin EC tablet 81 mg  81 mg Oral Daily Doree Albee, MD      . clopidogrel (PLAVIX) tablet 75 mg  75 mg Oral Daily Doree Albee, MD      . cyclobenzaprine (FLEXERIL) tablet 5 mg  5 mg Oral BID PRN Doree Albee, MD      . enoxaparin (LOVENOX) injection 40 mg  40 mg Subcutaneous Q24H Doree Albee, MD      . famotidine (PEPCID) tablet 20 mg  20 mg Oral BID Doree Albee, MD      . fish oil-omega-3 fatty acids capsule 1 g  1 g Oral BID Doree Albee, MD      . lisinopril (PRINIVIL,ZESTRIL) tablet 5 mg  5 mg Oral Daily Doree Albee, MD      . nitroGLYCERIN (NITROSTAT) SL tablet 0.4 mg  0.4 mg Sublingual Q5 min PRN Doree Albee, MD      . Melene Muller ON 03/08/2014] ONE-A-DAY MENS VITACRAVES CHEW 1 tablet  1 tablet Oral q morning - 10a Doree Albee, MD      . oxyCODONE-acetaminophen (PERCOCET/ROXICET) 5-325 MG per tablet 1 tablet  1 tablet Oral Q6H PRN Doree Albee, MD      . Melene Muller ON 03/08/2014] PARoxetine (PAXIL) tablet 20 mg  20 mg Oral Hinton Lovely, MD      . senna-docusate (Senokot-S) tablet 1 tablet  1 tablet Oral QHS PRN Doree Albee, MD  Current Outpatient Prescriptions  Medication Sig Dispense Refill  . aspirin EC 81 MG tablet Take 81 mg by mouth daily.      . clopidogrel (PLAVIX) 75 MG tablet Take 1 tablet (75 mg total) by mouth daily.  90 tablet  3  . cyclobenzaprine (FLEXERIL) 10 MG tablet Take 5 mg by mouth 2 (two) times daily as needed for muscle spasms.       . fish oil-omega-3 fatty acids 1000 MG capsule Take 1 g by mouth 2 (two) times daily.       Marland Kitchen lisinopril (PRINIVIL,ZESTRIL) 5 MG tablet Take 1 tablet (5 mg total) by mouth daily.  30 tablet  1  . Multiple Vitamins-Minerals (ONE-A-DAY MENS VITACRAVES PO) Take 1 tablet by mouth daily.       Marland Kitchen oxyCODONE-acetaminophen (PERCOCET/ROXICET) 5-325 MG per tablet Take 1 tablet by mouth every 6 (six) hours as needed for moderate pain.      Marland Kitchen PARoxetine (PAXIL) 20 MG tablet Take 1 tablet (20 mg total) by mouth every morning.  30 tablet  1  . ranitidine (ZANTAC) 150 MG tablet Take 1 tablet (150 mg total) by mouth 2 (two) times daily.  60 tablet  1  . nitroGLYCERIN (NITROSTAT) 0.4 MG SL tablet Place 0.4 mg under the tongue every 5 (five) minutes as needed for chest pain.       Review Of Systems: 12 point ROS negative except as noted above in HPI.  Physical Exam: Filed Vitals:   03/07/14 2055  BP: 115/66  Pulse: 69  Temp:   Resp: 21    General: alert and cooperative HEENT: PERRLA and extra ocular movement intact Heart: S1, S2 normal, no  murmur, rub or gallop, regular rate and rhythm Lungs: clear to auscultation, no wheezes or rales and unlabored breathing Abdomen: abdomen is soft without significant tenderness, masses, organomegaly or guarding Extremities: extremities normal, atraumatic, no cyanosis or edema Skin:no rashes, no ecchymoses Neurology: generalized R sided weakness and decreased sensation. Otherwise normal exam.   Labs and Imaging: Lab Results  Component Value Date/Time   NA 137 03/07/2014  8:25 PM   K 4.1 03/07/2014  8:25 PM   CL 103 03/07/2014  8:25 PM   CO2 25 03/07/2014  8:16 PM   BUN 13 03/07/2014  8:25 PM   CREATININE 1.20 03/07/2014  8:25 PM   GLUCOSE 95 03/07/2014  8:25 PM   Lab Results  Component Value Date   WBC 9.5 03/07/2014   HGB 14.6 03/07/2014   HCT 43.0 03/07/2014   MCV 85.9 03/07/2014   PLT 205 03/07/2014    Ct Head Wo Contrast  03/07/2014   CLINICAL DATA:  Right arm and leg drift.  EXAM: CT HEAD WITHOUT CONTRAST  TECHNIQUE: Contiguous axial images were obtained from the base of the skull through the vertex without contrast.  COMPARISON:  MRI of the brain 01/14/2014.  CT head 01/14/2014.  FINDINGS: Normal appearance of the intracranial structures. No evidence for acute hemorrhage, mass lesion, midline shift, hydrocephalus or large infarct. No acute bony abnormality. The visualized sinuses are clear. Previous sinus disease has cleared.  IMPRESSION: No acute intracranial abnormality.  Stable appearance from priors.   Electronically Signed   By: Davonna Belling M.D.   On: 03/07/2014 21:05   Ct Angio Chest Aortic Dissect W &/or W/o  03/07/2014   CLINICAL DATA:  Chest pain.  EXAM: CT ANGIOGRAPHY CHEST, ABDOMEN AND PELVIS  TECHNIQUE: Multidetector CT imaging through the chest, abdomen and pelvis was performed  using the standard protocol during bolus administration of intravenous contrast. Multiplanar reconstructed images and MIPs were obtained and reviewed to evaluate the vascular anatomy.  CONTRAST:  100mL OMNIPAQUE  IOHEXOL 350 MG/ML SOLN  COMPARISON:  CT scan of December 27, 2013.  FINDINGS: CTA CHEST FINDINGS  No pneumothorax or pleural effusion is noted. No acute pulmonary disease is noted. No mediastinal mass or adenopathy is noted mild coronary artery calcifications are noted. Moderate hiatal hernia is noted. There is no evidence of thoracic aortic dissection or aneurysm. 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 There is no evidence of pulmonary embolus.  Review of the MIP images confirms the above findings.  CTA ABDOMEN AND PELVIS FINDINGS  No focal abnormality is noted in the liver, spleen or pancreas. Cholelithiasis is noted. No evidence of aneurysm or dissection is seen involving the abdominal aorta. Atherosclerotic calcifications are noted in the abdominal aorta and iliac arteries. The mesenteric and renal arteries are widely patent without significant stenosis. Adrenal glands and kidneys appear normal. No hydronephrosis or renal obstruction is noted. No evidence of bowel obstruction is noted. The appendix appears normal. Urinary bladder appears normal. Stable mild bilateral fat containing inguinal hernias are noted.  Review of the MIP images confirms the above findings.  IMPRESSION: No evidence of thoracic or abdominal aortic aneurysm or dissection.  Stable hiatal hernia compared to prior exam.  Cholelithiasis.  Stable mild bilateral fat containing inguinal hernias.   Electronically Signed   By: Roque LiasJames  Green M.D.   On: 03/07/2014 21:17   Ct Angio Abd/pel W/ And/or W/o  03/07/2014   CLINICAL DATA:  Chest pain.  EXAM: CT ANGIOGRAPHY CHEST, ABDOMEN AND PELVIS  TECHNIQUE: Multidetector CT imaging through the chest, abdomen and pelvis was performed using the standard protocol during bolus administration of intravenous contrast. Multiplanar reconstructed images and MIPs were obtained and reviewed to evaluate the vascular anatomy.  CONTRAST:  100mL OMNIPAQUE IOHEXOL 350 MG/ML SOLN  COMPARISON:   CT scan of December 27, 2013.  FINDINGS: CTA CHEST FINDINGS  No pneumothorax or pleural effusion is noted. No acute pulmonary disease is noted. No mediastinal mass or adenopathy is noted mild coronary artery calcifications are noted. Moderate hiatal hernia is noted. There is no evidence of thoracic aortic dissection or aneurysm. 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 There is no evidence of pulmonary embolus.  Review of the MIP images confirms the above findings.  CTA ABDOMEN AND PELVIS FINDINGS  No focal abnormality is noted in the liver, spleen or pancreas. Cholelithiasis is noted. No evidence of aneurysm or dissection is seen involving the abdominal aorta. Atherosclerotic calcifications are noted in the abdominal aorta and iliac arteries. The mesenteric and renal arteries are widely patent without significant stenosis. Adrenal glands and kidneys appear normal. No hydronephrosis or renal obstruction is noted. No evidence of bowel obstruction is noted. The appendix appears normal. Urinary bladder appears normal. Stable mild bilateral fat containing inguinal hernias are noted.  Review of the MIP images confirms the above findings.  IMPRESSION: No evidence of thoracic or abdominal aortic aneurysm or dissection.  Stable hiatal hernia compared to prior exam.  Cholelithiasis.  Stable mild bilateral fat containing inguinal hernias.   Electronically Signed   By: Roque LiasJames  Green M.D.   On: 03/07/2014 21:17           Doree AlbeeSteven Jontrell Bushong MD  Pager: [phone] ]

## 2014-03-07 NOTE — ED Notes (Signed)
This RN transported patient to CT scan, VSS.

## 2014-03-07 NOTE — Code Documentation (Signed)
Code stroke called at 2014 for this pt who was LSW at 1900 while sitting on the couch talking to his wife.  He got up to take a shower, started trembling and had increased RUE and RLE weakness.  PT had prior CVA on December 27, 2013 with right side weakness requiring use of cane and walker.  At the time of trembling pt also c/o dipahoresis and dizziness.  [facility]  [facility] enroute to [facility] pt experienced midsternal CP with radiation to left arm and was given Baby ASA 324 mg and NTG.4mg  sl x 2 with minor relief of CP. Pt arrived in [facility] at 1950, cleared by EDP for CT at 2004, arrived CT at 2019 with RRT and Dr Leroy Kennedyamilo arriving at 2021.  Initial NIHSS 2.    CT with no intracranial abnormaility. Troponin 0.0 To be admitted by medicine team

## 2014-03-07 NOTE — ED Notes (Signed)
Attempted report x1. 

## 2014-03-08 DIAGNOSIS — R072 Precordial pain: Secondary | ICD-10-CM

## 2014-03-08 DIAGNOSIS — F410 Panic disorder [episodic paroxysmal anxiety] without agoraphobia: Secondary | ICD-10-CM | POA: Diagnosis not present

## 2014-03-08 DIAGNOSIS — R29898 Other symptoms and signs involving the musculoskeletal system: Secondary | ICD-10-CM | POA: Diagnosis not present

## 2014-03-08 DIAGNOSIS — F411 Generalized anxiety disorder: Secondary | ICD-10-CM

## 2014-03-08 DIAGNOSIS — I517 Cardiomegaly: Secondary | ICD-10-CM

## 2014-03-08 DIAGNOSIS — G894 Chronic pain syndrome: Secondary | ICD-10-CM | POA: Diagnosis present

## 2014-03-08 DIAGNOSIS — I739 Peripheral vascular disease, unspecified: Secondary | ICD-10-CM | POA: Diagnosis present

## 2014-03-08 DIAGNOSIS — R079 Chest pain, unspecified: Secondary | ICD-10-CM | POA: Diagnosis present

## 2014-03-08 DIAGNOSIS — M542 Cervicalgia: Secondary | ICD-10-CM | POA: Diagnosis not present

## 2014-03-08 LAB — TROPONIN I: Troponin I: <0.3 ng/mL (ref <0.30)

## 2014-03-08 LAB — GLUCOSE, CAPILLARY: Glucose-Capillary: 123 mg/dL — ABNORMAL HIGH (ref 70–99)

## 2014-03-08 LAB — LIPID PANEL
CHOLESTEROL: 151 mg/dL (ref 0–200)
HDL: 40 mg/dL (ref >39)
LDL Cholesterol: 79 mg/dL (ref 0–99)
Total CHOL/HDL Ratio: 3.8 RATIO
Triglycerides: 160 mg/dL — ABNORMAL HIGH (ref <150)
VLDL: 32 mg/dL (ref 0–40)

## 2014-03-08 LAB — HEMOGLOBIN A1C
Hgb A1c MFr Bld: 5.6 % (ref <5.7)
MEAN PLASMA GLUCOSE: 114 mg/dL (ref <117)

## 2014-03-08 MED ORDER — ATORVASTATIN CALCIUM 80 MG PO TABS
80.0000 mg | ORAL_TABLET | ORAL | Status: AC
  Administered 2014-03-08: 80 mg via ORAL
  Filled 2014-03-08: qty 1

## 2014-03-08 MED ORDER — ASPIRIN 81 MG PO CHEW
81.0000 mg | CHEWABLE_TABLET | ORAL | Status: AC
  Administered 2014-03-09: 81 mg via ORAL
  Filled 2014-03-08: qty 1

## 2014-03-08 MED ORDER — SODIUM CHLORIDE 0.9 % IJ SOLN: 3.0000 mL | INTRAMUSCULAR | Status: DC | PRN

## 2014-03-08 MED ORDER — SODIUM CHLORIDE 0.9 % IJ SOLN: 3.0000 mL | Freq: Two times a day (BID) | INTRAMUSCULAR | Status: DC

## 2014-03-08 MED ORDER — OXYCODONE-ACETAMINOPHEN 5-325 MG PO TABS
1.0000 | ORAL_TABLET | Freq: Once | ORAL | Status: AC
  Administered 2014-03-08: 1 via ORAL

## 2014-03-08 MED ORDER — SODIUM CHLORIDE 0.9 % IV SOLN: 250.0000 mL | INTRAVENOUS | Status: DC | PRN

## 2014-03-08 MED ORDER — ATORVASTATIN CALCIUM 80 MG PO TABS
80.0000 mg | ORAL_TABLET | Freq: Every day | ORAL | Status: DC
  Administered 2014-03-10: 07:00:00 80 mg via ORAL
  Filled 2014-03-08 (×2): qty 1

## 2014-03-08 MED ORDER — SODIUM CHLORIDE 0.9 % IV SOLN
1.0000 mL/kg/h | INTRAVENOUS | Status: DC
  Administered 2014-03-08: 1 mL/kg/h via INTRAVENOUS

## 2014-03-08 NOTE — Progress Notes (Addendum)
[facility] PROGRESS NOTE  ACHERON SUGG ZOX:[id number] DOB: 1951-07-29 DOA: 03/07/2014 PCP: Carmin Richmond, MD  Brief Narrative:   Justin Wells is an 63 y.o. male with a PMH of  HTN, hyperlipidemia, CAD with PCI/Stents 2009, MI, PAD s/p femoral stenting, TIA's, ischemic stroke in the setting of acute MI 2014, recent RUE weakness in 4/15, given tPA, anxiety, brought to [facility] ED by ambulance as a code stroke due to acute onset of right arm weakness-numbness, chest pain, tremors Per his wife, Justin Wells was at home sitting on the couch when suddenly started trembling, became sweaty, clammy, and then his right side was weak and numb and EMS was called.  He developed chest pain and shortness of breath on route. Symptoms since resolved. Workup underway, ruled out for ACS and Acute CVA.  Assessment/Plan: 1. R arm weakness/numbness likely TIA -improved back to baseline -Presented with similar symptoms 4/15, received Tpa, MRI negative then, and also h/o prior CVA -MRI negative -COntinue ASA/plavix -NEuro following -PT/OT eval  2. Chest pain/pressure -resolved, likely anxiety related -EKG and cardiac enzymes negative -H/o CAD and Stents to CFx/OM in 2009, since then reports having MI @ [facility] in setting of Nissen Fundoplication Sx -Lost to cards FU -Will request Cards eval  3. Anxiety disorder -continue paxil  4. HTN -stable  5. Chronic pain -continue percocet at home dose  DVT proph: lovenox  Code Status: Full code Family Communication: none at bedside Disposition Plan: Home pending workup   Consultants:  Neuro  HPI/Subjective: R side weakness/numbness resolved, no chest pain today  Objective: Filed Vitals:   03/08/14 0900  BP: 116/65  Pulse:   Temp: 98.1 F (36.7 C)  Resp: 16    Intake/Output Summary (Last 24 hours) at 03/08/14 1107 Last data filed at 03/08/14 0530  Gross per 24 hour  Intake      0 ml  Output    600 ml  Net   -600 ml   Filed Weights    03/07/14 2005  Weight: 95.709 kg (211 lb)    Exam:   General:  AAOx3, anxious appearing  Cardiovascular: S1S2/RRR  Respiratory: CTAB  Abdomen: soft, Nt, BS present  Musculoskeletal: no edema c/c  NEuro: motor 5/5 B/L, sensory light touch intact DTR 2plus, plantars upgoing  Data Reviewed: Basic Metabolic Panel:  Recent Labs Lab 03/07/14 2016 03/07/14 2025  NA 139 137  K 4.4 4.1  CL 100 103  CO2 25  --   GLUCOSE 94 95  BUN 14 13  CREATININE 1.08 1.20  CALCIUM 9.0  --    Liver Function Tests:  Recent Labs Lab 03/07/14 2016  AST 24  ALT 20  ALKPHOS 86  BILITOT 0.3  PROT 6.9  ALBUMIN 3.9   No results found for this basename: LIPASE, AMYLASE,  in the last 168 hours No results found for this basename: AMMONIA,  in the last 168 hours CBC:  Recent Labs Lab 03/07/14 2016 03/07/14 2025  WBC 9.5  --   NEUTROABS 6.2  --   HGB 14.1 14.6  HCT 40.8 43.0  MCV 85.9  --   PLT 205  --    Cardiac Enzymes:  Recent Labs Lab 03/07/14 2200 03/08/14 0440 03/08/14 0958  TROPONINI <0.30 <0.30 <0.30   BNP (last 3 results) No results found for this basename: PROBNP,  in the last 8760 hours CBG: No results found for this basename: GLUCAP,  in the last 168 hours  No results found for this  or any previous visit (from the past 240 hour(s)).   Studies: Dg Chest 1 View  03/07/2014   CLINICAL DATA:  Chest pain.  Shortness of breath.  Previous stroke.  EXAM: CHEST - 1 VIEW  COMPARISON:  12/27/2013  FINDINGS: The heart size and mediastinal contours are within normal limits. Both lungs are clear. The visualized skeletal structures are unremarkable.  IMPRESSION: No active disease.   Electronically Signed   By: Myles RosenthalJohn  Stahl M.D.   On: 03/07/2014 23:17   Ct Head Wo Contrast  03/07/2014   CLINICAL DATA:  Right arm and leg drift.  EXAM: CT HEAD WITHOUT CONTRAST  TECHNIQUE: Contiguous axial images were obtained from the base of the skull through the vertex without contrast.   COMPARISON:  MRI of the brain 01/14/2014.  CT head 01/14/2014.  FINDINGS: Normal appearance of the intracranial structures. No evidence for acute hemorrhage, mass lesion, midline shift, hydrocephalus or large infarct. No acute bony abnormality. The visualized sinuses are clear. Previous sinus disease has cleared.  IMPRESSION: No acute intracranial abnormality.  Stable appearance from priors.   Electronically Signed   By: Davonna BellingJohn  Curnes M.D.   On: 03/07/2014 21:05   Mr Brain Wo Contrast  03/07/2014   CLINICAL DATA:  Right-sided visual loss with blurry vision.  EXAM: MRI HEAD WITHOUT CONTRAST  TECHNIQUE: Multiplanar, multiecho pulse sequences of the brain and surrounding structures were obtained without intravenous contrast.  COMPARISON:  Prior CT from earlier the same day as well as prior MRI from 01/14/2014  FINDINGS: Mild diffuse prominence of the CSF containing spaces is compatible with age-appropriate atrophy. Scattered T2/FLAIR subcentimeter hyperintensities within the periventricular and deep white matter both cerebral hemispheres are most consistent with mild chronic microvascular ischemic changes.  No mass lesion, midline shift, or extra-axial fluid collection. Ventricles are normal in size without evidence of hydrocephalus.  No diffusion-weighted signal abnormality is identified to suggest acute intracranial infarct. Gray-white matter differentiation is maintained. Normal flow voids are seen within the intracranial vasculature. No intracranial hemorrhage identified.  The cervicomedullary junction is normal. Pituitary gland is within normal limits. Pituitary stalk is midline. The globes and optic nerves demonstrate a normal appearance with normal signal intensity.  The bone marrow signal intensity is normal. Calvarium is intact. Visualized upper cervical spine is within normal limits.  Scalp soft tissues are unremarkable.  Mild T2 hyperintense mucosal thickening noted within the maxillary and ethmoidal  sinuses bilaterally. Minimal mucosal thickening also present within the right frontal sinus. No mastoid effusion.  IMPRESSION: 1. Stable exam with no acute intracranial infarct or other abnormality. 2. Mild chronic microvascular ischemic changes.   Electronically Signed   By: Rise MuBenjamin  McClintock M.D.   On: 03/07/2014 22:44   Ct Angio Chest Aortic Dissect W &/or W/o  03/07/2014   CLINICAL DATA:  Chest pain.  EXAM: CT ANGIOGRAPHY CHEST, ABDOMEN AND PELVIS  TECHNIQUE: Multidetector CT imaging through the chest, abdomen and pelvis was performed using the standard protocol during bolus administration of intravenous contrast. Multiplanar reconstructed images and MIPs were obtained and reviewed to evaluate the vascular anatomy.  CONTRAST:  100mL OMNIPAQUE IOHEXOL 350 MG/ML SOLN  COMPARISON:  CT scan of December 27, 2013.  FINDINGS: CTA CHEST FINDINGS  No pneumothorax or pleural effusion is noted. No acute pulmonary disease is noted. No mediastinal mass or adenopathy is noted mild coronary artery calcifications are noted. Moderate hiatal hernia is noted. There is no evidence of thoracic aortic dissection or aneurysm. 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 There is no evidence of pulmonary embolus.  Review of the MIP images confirms the above findings.  CTA ABDOMEN AND PELVIS FINDINGS  No focal abnormality is noted in the liver, spleen or pancreas. Cholelithiasis is noted. No evidence of aneurysm or dissection is seen involving the abdominal aorta. Atherosclerotic calcifications are noted in the abdominal aorta and iliac arteries. The mesenteric and renal arteries are widely patent without significant stenosis. Adrenal glands and kidneys appear normal. No hydronephrosis or renal obstruction is noted. No evidence of bowel obstruction is noted. The appendix appears normal. Urinary bladder appears normal. Stable mild bilateral fat containing inguinal hernias are noted.  Review of the MIP images  confirms the above findings.  IMPRESSION: No evidence of thoracic or abdominal aortic aneurysm or dissection.  Stable hiatal hernia compared to prior exam.  Cholelithiasis.  Stable mild bilateral fat containing inguinal hernias.   Electronically Signed   By: Roque Lias M.D.   On: 03/07/2014 21:17   Ct Angio Abd/pel W/ And/or W/o  03/07/2014   CLINICAL DATA:  Chest pain.  EXAM: CT ANGIOGRAPHY CHEST, ABDOMEN AND PELVIS  TECHNIQUE: Multidetector CT imaging through the chest, abdomen and pelvis was performed using the standard protocol during bolus administration of intravenous contrast. Multiplanar reconstructed images and MIPs were obtained and reviewed to evaluate the vascular anatomy.  CONTRAST:  OMNIPAQUE IOHEXOL 350 MG/ML SOLN  COMPARISON:  CT scan of December 27, 2013.  FINDINGS: CTA CHEST FINDINGS  No pneumothorax or pleural effusion is noted. No acute pulmonary disease is noted. No mediastinal mass or adenopathy is noted mild coronary artery calcifications are noted. Moderate hiatal hernia is noted. There is no evidence of thoracic aortic dissection or aneurysm. 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 There is no evidence of pulmonary embolus.  Review of the MIP images confirms the above findings.  CTA ABDOMEN AND PELVIS FINDINGS  No focal abnormality is noted in the liver, spleen or pancreas. Cholelithiasis is noted. No evidence of aneurysm or dissection is seen involving the abdominal aorta. Atherosclerotic calcifications are noted in the abdominal aorta and iliac arteries. The mesenteric and renal arteries are widely patent without significant stenosis. Adrenal glands and kidneys appear normal. No hydronephrosis or renal obstruction is noted. No evidence of bowel obstruction is noted. The appendix appears normal. Urinary bladder appears normal. Stable mild bilateral fat containing inguinal hernias are noted.  Review of the MIP images confirms the above findings.   IMPRESSION: No evidence of thoracic or abdominal aortic aneurysm or dissection.  Stable hiatal hernia compared to prior exam.  Cholelithiasis.  Stable mild bilateral fat containing inguinal hernias.   Electronically Signed   By: Roque Lias M.D.   On: 03/07/2014 21:17    Scheduled Meds: . aspirin EC  81 mg Oral Daily  . clopidogrel  75 mg Oral Daily  . enoxaparin (LOVENOX) injection  40 mg Subcutaneous Q24H  . famotidine  20 mg Oral BID  . lisinopril  5 mg Oral Daily  . multivitamin with minerals  1 tablet Oral Daily  . omega-3 acid ethyl esters  1 g Oral Daily  . PARoxetine  20 mg Oral Daily   Continuous Infusions:  Antibiotics Given (last 72 hours)   None      Active Problems:   CVA (cerebral infarction)   Atherosclerosis of coronary artery   TIA (transient ischemic attack)   Chest pain   PAD (peripheral artery disease)   Anxiety state,  unspecified   Chronic pain syndrome    Time spent: 35min    Dekalb HealthJOSEPH,Justin Wells  [facility] Pager [phone]. If 7PM-7AM, please contact night-coverage at [email or web address], password [email or web address] 03/08/2014, 11:07 AM  LOS: 1 day

## 2014-03-08 NOTE — Consult Note (Signed)
Primary cardiologist: Gwenlyn Found  HPI: 63 year old male for evaluation of chest pain. Patient is followed by Dr. Gwenlyn Found and has a history of coronary artery disease. Patient underwent cardiac catheterization in January of 2009. He was treated with PCI of his second marginal and circumflex. He has also had PTCA of his diagonal and first marginal. Followup catheterization in February of 2009 showed no obstructive disease and normal LV function. Patient apparently has had continuing intermittent chest pain. His symptoms occur both with exertion and at rest. They are described as a heaviness with radiation to his neck and left arm. Not pleuritic or positional. No nausea. 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 He states he has residual weakness in his right side. Since that time he has had apparent recurrent spells described as a flushing sensation in his head followed by dizziness, diaphoresis and weakness. He had a more severe episode yesterday associated with diffuse tremors. He then developed chest pain that is similar to his past episodes but more prolonged. It lasted four hours and then resolved. Note patient had an echocardiogram in April of 2015 that showed normal LV function and grade 1 diastolic dysfunction. MRA in April of 2015 showed mild branch vessel irregularities.  Medications Prior to Admission  Medication Sig Dispense Refill  . aspirin EC 81 MG tablet Take 81 mg by mouth daily.      . clopidogrel (PLAVIX) 75 MG tablet Take 1 tablet (75 mg total) by mouth daily.  90 tablet  3  . cyclobenzaprine (FLEXERIL) 10 MG tablet Take 5 mg by mouth 2 (two) times daily as needed for muscle spasms.       . fish oil-omega-3 fatty acids 1000 MG capsule Take 1 g by mouth 2 (two) times daily.       Marland Kitchen lisinopril (PRINIVIL,ZESTRIL) 5 MG tablet Take 1 tablet (5 mg total) by mouth daily.  30 tablet  1  . Multiple Vitamins-Minerals (ONE-A-DAY MENS VITACRAVES PO) Take 1 tablet  by mouth daily.       Marland Kitchen oxyCODONE-acetaminophen (PERCOCET/ROXICET) 5-325 MG per tablet Take 1 tablet by mouth every 6 (six) hours as needed for moderate pain.      Marland Kitchen PARoxetine (PAXIL) 20 MG tablet Take 1 tablet (20 mg total) by mouth every morning.  30 tablet  1  . ranitidine (ZANTAC) 150 MG tablet Take 1 tablet (150 mg total) by mouth 2 (two) times daily.  60 tablet  1  . nitroGLYCERIN (NITROSTAT) 0.4 MG SL tablet Place 0.4 mg under the tongue every 5 (five) minutes as needed for chest pain.        No Known Allergies  Past Medical History  Diagnosis Date  . Peripheral arterial disease   . Hyperlipidemia   . Coronary artery disease   . Hypertension   . Barrett's esophagus   . Gastroesophageal reflux   . Cerebrovascular disease   . Myocardial infarction 2009    "during cardiac cath" (01/27/2013)  . H/O hiatal hernia   . XLKGMWNU(272.5)     "probably monthly" (01/27/2013)  . Seizures     "when I had my heart attack in 2009" (01/27/2013)  . 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 Arthritis     "hands" (01/27/2013)    Past Surgical History  Procedure Laterality Date  .  Laparoscopic gastric banding  2010  . Cardiac catheterization    . Coronary angioplasty with stent placement  11/02/2007    "2" (01/27/2013)  . Laparoscopic nissen fundoplication  ?2001  . Femoral artery stent Right 01/26/2013  . Diagnostic laparoscopy      "twice after nissen; it kept coming apart " (01/27/2013)    History   Social History  . Marital Status: Married    Spouse Name: sheryl    Number of Children: 3  . Years of Education: 11th   Occupational History  . WELDER    Social History Main Topics  . Smoking status: Former Smoker -- 2.00 packs/day for 35 years    Types: Cigarettes    Quit date: 01/28/2001  . Smokeless tobacco: Never Used  . Alcohol Use: No     Comment: 01/27/2013 "quit  drinking in 2001; I'm a recovering alcoholic"  . Drug Use: No  . Sexual Activity: Not Currently   Other Topics Concern  . Not on file   Social History Narrative   Patient lives with his wife, patient drinks coffee     Family History  Problem Relation Age of Onset  . Heart disease Mother     Heart Disease before age 12  . Hypertension Mother   . Heart attack Mother   . Heart attack Father     ROS:  Weakness but no fevers or chills, productive cough, hemoptysis, dysphasia, odynophagia, melena, hematochezia, dysuria, hematuria, rash, seizure activity, orthopnea, PND, pedal edema, claudication. Remaining systems are negative.  Physical Exam:   Blood pressure 133/82, pulse 68, temperature 98.1 F (36.7 C), temperature source Oral, resp. rate 16, height _0  (1.778 m), weight 211 lb (95.709 kg), SpO2 96.00%.  General:  Well developed/well nourished in NAD Skin warm/dry Patient not depressed No peripheral clubbing Back-normal HEENT-normal/normal eyelids Neck supple/normal carotid upstroke bilaterally; no bruits; no JVD; no thyromegaly chest - CTA/ normal expansion CV - RRR/normal S1 and S2; no murmurs, rubs or gallops;  PMI nondisplaced Abdomen -NT/ND, no HSM, no mass, + bowel sounds, no bruit 1+ femoral pulses, no bruits Ext-no edema, chords; diminished distal pulses Neuro-some residual weakness right side.  ECG sinus rhythm with incomplete right bundle branch block. No ST changes.   Results for orders placed during the hospital encounter of 03/07/14 (from the past 48 hour(s))  ETHANOL     Status: None   Collection Time    03/07/14  8:16 PM      Result Value Ref Range   Alcohol, Ethyl (B) <11  0 - 11 mg/dL   Comment:            LOWEST DETECTABLE LIMIT FOR     SERUM ALCOHOL IS 11 mg/dL     FOR MEDICAL PURPOSES ONLY  PROTIME-INR     Status: None   Collection Time    03/07/14  8:16 PM      Result Value Ref Range   Prothrombin Time 13.5  11.6 - 15.2 seconds   INR  1.03  0.00 - 1.49  APTT     Status: None   Collection Time    03/07/14  8:16 PM      Result Value Ref Range   aPTT 30  24 - 37 seconds  CBC     Status: None   Collection Time    03/07/14  8:16 PM      Result Value Ref Range   WBC 9.5  4.0 - 10.5 K/uL   RBC  4.75  4.22 - 5.81 MIL/uL   Hemoglobin 14.1  13.0 - 17.0 g/dL   HCT 40.8  39.0 - 52.0 %   MCV 85.9  78.0 - 100.0 fL   MCH 29.7  26.0 - 34.0 pg   MCHC 34.6  30.0 - 36.0 g/dL   RDW 13.9  11.5 - 15.5 %   Platelets 205  150 - 400 K/uL  DIFFERENTIAL     Status: None   Collection Time    03/07/14  8:16 PM      Result Value Ref Range   Neutrophils Relative % 65  43 - 77 %   Neutro Abs 6.2  1.7 - 7.7 K/uL   Lymphocytes Relative 20  12 - 46 %   Lymphs Abs 2.0  0.7 - 4.0 K/uL   Monocytes Relative 9  3 - 12 %   Monocytes Absolute 0.8  0.1 - 1.0 K/uL   Eosinophils Relative 5  0 - 5 %   Eosinophils Absolute 0.5  0.0 - 0.7 K/uL   Basophils Relative 1  0 - 1 %   Basophils Absolute 0.1  0.0 - 0.1 K/uL  COMPREHENSIVE METABOLIC PANEL     Status: Abnormal   Collection Time    03/07/14  8:16 PM      Result Value Ref Range   Sodium 139  137 - 147 mEq/L   Potassium 4.4  3.7 - 5.3 mEq/L   Chloride 100  96 - 112 mEq/L   CO2 25  19 - 32 mEq/L   Glucose, Bld 94  70 - 99 mg/dL   BUN 14  6 - 23 mg/dL   Creatinine, Ser 1.08  0.50 - 1.35 mg/dL   Calcium 9.0  8.4 - 10.5 mg/dL   Total Protein 6.9  6.0 - 8.3 g/dL   Albumin 3.9  3.5 - 5.2 g/dL   AST 24  0 - 37 U/L   Comment: HEMOLYSIS AT THIS LEVEL MAY AFFECT RESULT   ALT 20  0 - 53 U/L   Alkaline Phosphatase 86  39 - 117 U/L   Total Bilirubin 0.3  0.3 - 1.2 mg/dL   GFR calc non Af Amer 72 (*) >90 mL/min   GFR calc Af Amer 84 (*) >90 mL/min   Comment: (NOTE)     The eGFR has been calculated using the CKD EPI equation.     This calculation has not been validated in all clinical situations.     eGFR's persistently <90 mL/min signify possible Chronic Kidney     Disease.   Anion gap 14  5 - 15    I-STAT TROPOININ, ED     Status: None   Collection Time    03/07/14  8:23 PM      Result Value Ref Range   Troponin i, poc 0.00  0.00 - 0.08 ng/mL   Comment 3            Comment: Due to the release kinetics of cTnI,     a negative result within the first hours     of the onset of symptoms does not rule out     myocardial infarction with certainty.     If myocardial infarction is still suspected,     repeat the test at appropriate intervals.  I-STAT CHEM 8, ED     Status: Abnormal   Collection Time    03/07/14  8:25 PM      Result Value Ref Range   Sodium  137  137 - 147 mEq/L   Potassium 4.1  3.7 - 5.3 mEq/L   Chloride 103  96 - 112 mEq/L   BUN 13  6 - 23 mg/dL   Creatinine, Ser 1.20  0.50 - 1.35 mg/dL   Glucose, Bld 95  70 - 99 mg/dL   Calcium, Ion 1.11 (*) 1.13 - 1.30 mmol/L   TCO2 24  0 - 100 mmol/L   Hemoglobin 14.6  13.0 - 17.0 g/dL   HCT 43.0  39.0 - 52.0 %  URINE RAPID DRUG SCREEN (HOSP PERFORMED)     Status: Abnormal   Collection Time    03/07/14  9:07 PM      Result Value Ref Range   Opiates POSITIVE (*) NONE DETECTED   Cocaine NONE DETECTED  NONE DETECTED   Benzodiazepines NONE DETECTED  NONE DETECTED   Amphetamines NONE DETECTED  NONE DETECTED   Tetrahydrocannabinol NONE DETECTED  NONE DETECTED   Barbiturates NONE DETECTED  NONE DETECTED   Comment:            DRUG SCREEN FOR MEDICAL PURPOSES     ONLY.  IF CONFIRMATION IS NEEDED     FOR ANY PURPOSE, NOTIFY LAB     WITHIN 5 DAYS.                LOWEST DETECTABLE LIMITS     FOR URINE DRUG SCREEN     Drug Class       Cutoff (ng/mL)     Amphetamine      1000     Barbiturate      200     Benzodiazepine   235     Tricyclics       573     Opiates          300     Cocaine          300     THC              50  URINALYSIS, ROUTINE W REFLEX MICROSCOPIC     Status: None   Collection Time    03/07/14  9:07 PM      Result Value Ref Range   Color, Urine YELLOW  YELLOW   APPearance CLEAR  CLEAR   Specific Gravity,  Urine 1.028  1.005 - 1.030   pH 5.5  5.0 - 8.0   Glucose, UA NEGATIVE  NEGATIVE mg/dL   Hgb urine dipstick NEGATIVE  NEGATIVE   Bilirubin Urine NEGATIVE  NEGATIVE   Ketones, ur NEGATIVE  NEGATIVE mg/dL   Protein, ur NEGATIVE  NEGATIVE mg/dL   Urobilinogen, UA 1.0  0.0 - 1.0 mg/dL   Nitrite NEGATIVE  NEGATIVE   Leukocytes, UA NEGATIVE  NEGATIVE   Comment: MICROSCOPIC NOT DONE ON URINES WITH NEGATIVE PROTEIN, BLOOD, LEUKOCYTES, NITRITE, OR GLUCOSE <1000 mg/dL.  TROPONIN I     Status: None   Collection Time    03/07/14 10:00 PM      Result Value Ref Range   Troponin I <0.30  <0.30 ng/mL   Comment:            Due to the release kinetics of cTnI,     a negative result within the first hours     of the onset of symptoms does not rule out     myocardial infarction with certainty.     If myocardial infarction is still suspected,     repeat the test at appropriate intervals.  HEMOGLOBIN A1C     Status: None   Collection Time    03/08/14  4:40 AM      Result Value Ref Range   Hemoglobin A1C 5.6  <5.7 %   Comment: (NOTE)                                                                               According to the ADA Clinical Practice Recommendations for 2011, when     HbA1c is used as a screening test:      >=6.5%   Diagnostic of Diabetes Mellitus               (if abnormal result is confirmed)     5.7-6.4%   Increased risk of developing Diabetes Mellitus     References:Diagnosis and Classification of Diabetes Mellitus,Diabetes     UEAV,4098,11(BJYNW 1):S62-S69 and Standards of Medical Care in             Diabetes - 2011,Diabetes GNFA,2130,86 (Suppl 1):S11-S61.   Mean Plasma Glucose 114  <117 mg/dL   Comment: Performed at [facility]  TROPONIN I     Status: None   Collection Time    03/08/14  4:40 AM      Result Value Ref Range   Troponin I <0.30  <0.30 ng/mL   Comment:            Due to the release kinetics of cTnI,     a negative result within the first hours     of  the onset of symptoms does not rule out     myocardial infarction with certainty.     If myocardial infarction is still suspected,     repeat the test at appropriate intervals.  LIPID PANEL     Status: Abnormal   Collection Time    03/08/14  4:40 AM      Result Value Ref Range   Cholesterol 151  0 - 200 mg/dL   Triglycerides 160 (*) <150 mg/dL   HDL 40  >39 mg/dL   Total CHOL/HDL Ratio 3.8     VLDL 32  0 - 40 mg/dL   LDL Cholesterol 79  0 - 99 mg/dL   Comment:            Total Cholesterol/HDL:CHD Risk     Coronary Heart Disease Risk Table                         Men   Women      1/2 Average Risk   3.4   3.3      Average Risk       5.0   4.4      2 X Average Risk   9.6   7.1      3 X Average Risk  23.4   11.0                Use the calculated Patient Ratio     above and the CHD Risk Table     to determine the patient's CHD Risk.                ATP  III CLASSIFICATION (LDL):      <100     mg/dL   Optimal      100-129  mg/dL   Near or Above                        Optimal      130-159  mg/dL   Borderline      160-189  mg/dL   High      >190     mg/dL   Very High  TROPONIN I     Status: None   Collection Time    03/08/14  9:58 AM      Result Value Ref Range   Troponin I <0.30  <0.30 ng/mL   Comment:            Due to the release kinetics of cTnI,     a negative result within the first hours     of the onset of symptoms does not rule out     myocardial infarction with certainty.     If myocardial infarction is still suspected,     repeat the test at appropriate intervals.    Dg Chest 1 View  03/07/2014   CLINICAL DATA:  Chest pain.  Shortness of breath.  Previous stroke.  EXAM: CHEST - 1 VIEW  COMPARISON:  12/27/2013  FINDINGS: The heart size and mediastinal contours are within normal limits. Both lungs are clear. The visualized skeletal structures are unremarkable.  IMPRESSION: No active disease.   Electronically Signed   By: Earle Gell M.D.   On: 03/07/2014 23:17   Ct  Head Wo Contrast  03/07/2014   CLINICAL DATA:  Right arm and leg drift.  EXAM: CT HEAD WITHOUT CONTRAST  TECHNIQUE: Contiguous axial images were obtained from the base of the skull through the vertex without contrast.  COMPARISON:  MRI of the brain 01/14/2014.  CT head 01/14/2014.  FINDINGS: Normal appearance of the intracranial structures. No evidence for acute hemorrhage, mass lesion, midline shift, hydrocephalus or large infarct. No acute bony abnormality. The visualized sinuses are clear. Previous sinus disease has cleared.  IMPRESSION: No acute intracranial abnormality.  Stable appearance from priors.   Electronically Signed   By: Rolla Flatten M.D.   On: 03/07/2014 21:05   Mr Brain Wo Contrast  03/07/2014   CLINICAL DATA:  Right-sided visual loss with blurry vision.  EXAM: MRI HEAD WITHOUT CONTRAST  TECHNIQUE: Multiplanar, multiecho pulse sequences of the brain and surrounding structures were obtained without intravenous contrast.  COMPARISON:  Prior CT from earlier the same day as well as prior MRI from 01/14/2014  FINDINGS: Mild diffuse prominence of the CSF containing spaces is compatible with age-appropriate atrophy. Scattered T2/FLAIR subcentimeter hyperintensities within the periventricular and deep white matter both cerebral hemispheres are most consistent with mild chronic microvascular ischemic changes.  No mass lesion, midline shift, or extra-axial fluid collection. Ventricles are normal in size without evidence of hydrocephalus.  No diffusion-weighted signal abnormality is identified to suggest acute intracranial infarct. Gray-white matter differentiation is maintained. Normal flow voids are seen within the intracranial vasculature. No intracranial hemorrhage identified.  The cervicomedullary junction is normal. Pituitary gland is within normal limits. Pituitary stalk is midline. The globes and optic nerves demonstrate a normal appearance with normal signal intensity.  The bone marrow signal  intensity is normal. Calvarium is intact. Visualized upper cervical spine is within normal limits.  Scalp soft tissues are unremarkable.  Mild T2 hyperintense mucosal  thickening noted within the maxillary and ethmoidal sinuses bilaterally. Minimal mucosal thickening also present within the right frontal sinus. No mastoid effusion.  IMPRESSION: 1. Stable exam with no acute intracranial infarct or other abnormality. 2. Mild chronic microvascular ischemic changes.   Electronically Signed   By: Jeannine Boga M.D.   On: 03/07/2014 22:44   Ct Angio Chest Aortic Dissect W &/or W/o  03/07/2014   CLINICAL DATA:  Chest pain.  EXAM: CT ANGIOGRAPHY CHEST, ABDOMEN AND PELVIS  TECHNIQUE: Multidetector CT imaging through the chest, abdomen and pelvis was performed using the standard protocol during bolus administration of intravenous contrast. Multiplanar reconstructed images and MIPs were obtained and reviewed to evaluate the vascular anatomy.  CONTRAST:  136m OMNIPAQUE IOHEXOL 350 MG/ML SOLN  COMPARISON:  CT scan of December 27, 2013.  FINDINGS: CTA CHEST FINDINGS  No pneumothorax or pleural effusion is noted. No acute pulmonary disease is noted. No mediastinal mass or adenopathy is noted mild coronary artery calcifications are noted. Moderate hiatal hernia is noted. There is no evidence of thoracic aortic dissection or aneurysm. 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 There is no evidence of pulmonary embolus.  Review of the MIP images confirms the above findings.  CTA ABDOMEN AND PELVIS FINDINGS  No focal abnormality is noted in the liver, spleen or pancreas. Cholelithiasis is noted. No evidence of aneurysm or dissection is seen involving the abdominal aorta. Atherosclerotic calcifications are noted in the abdominal aorta and iliac arteries. The mesenteric and renal arteries are widely patent without significant stenosis. Adrenal glands and kidneys appear normal. No hydronephrosis or renal  obstruction is noted. No evidence of bowel obstruction is noted. The appendix appears normal. Urinary bladder appears normal. Stable mild bilateral fat containing inguinal hernias are noted.  Review of the MIP images confirms the above findings.  IMPRESSION: No evidence of thoracic or abdominal aortic aneurysm or dissection.  Stable hiatal hernia compared to prior exam.  Cholelithiasis.  Stable mild bilateral fat containing inguinal hernias.   Electronically Signed   By: JSabino DickM.D.   On: 03/07/2014 21:17   Ct Angio Abd/pel W/ And/or W/o  03/07/2014   CLINICAL DATA:  Chest pain.  EXAM: CT ANGIOGRAPHY CHEST, ABDOMEN AND PELVIS  TECHNIQUE: Multidetector CT imaging through the chest, abdomen and pelvis was performed using the standard protocol during bolus administration of intravenous contrast. Multiplanar reconstructed images and MIPs were obtained and reviewed to evaluate the vascular anatomy.  CONTRAST:  109mOMNIPAQUE IOHEXOL 350 MG/ML SOLN  COMPARISON:  CT scan of December 27, 2013.  FINDINGS: CTA CHEST FINDINGS  No pneumothorax or pleural effusion is noted. No acute pulmonary disease is noted. No mediastinal mass or adenopathy is noted mild coronary artery calcifications are noted. Moderate hiatal hernia is noted. There is no evidence of thoracic aortic dissection or aneurysm. 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 There is no evidence of pulmonary embolus.  Review of the MIP images confirms the above findings.  CTA ABDOMEN AND PELVIS FINDINGS  No focal abnormality is noted in the liver, spleen or pancreas. Cholelithiasis is noted. No evidence of aneurysm or dissection is seen involving the abdominal aorta. Atherosclerotic calcifications are noted in the abdominal aorta and iliac arteries. The mesenteric and renal arteries are widely patent without significant stenosis. Adrenal glands and kidneys appear normal. No hydronephrosis or renal obstruction is noted. No evidence of  bowel obstruction is noted. The appendix appears normal. Urinary bladder appears normal.  Stable mild bilateral fat containing inguinal hernias are noted.  Review of the MIP images confirms the above findings.  IMPRESSION: No evidence of thoracic or abdominal aortic aneurysm or dissection.  Stable hiatal hernia compared to prior exam.  Cholelithiasis.  Stable mild bilateral fat containing inguinal hernias.   Electronically Signed   By: Sabino Dick M.D.   On: 03/07/2014 21:17    Assessment/Plan 1 chest pain-symptoms are somewhat atypical. He has ruled out for myocardial infarction with serial enzymes. His electrocardiogram shows no ST changes. I discussed options for further evaluation with him. This would include functional study versus catheterization. He is extremely concerned about his symptoms. They have been recurrent since 2009. Given his history of PCI I feel definitive evaluation is necessary. Plan to proceed with cardiac catheterization tomorrow. The risks and benefits were discussed and he agrees to proceed. 2 coronary artery disease-continue aspirin and Plavix. Add statin. 3 question TIA-evaluation in progress by neurology. 4 hypertension-continue ACE inhibitor.  Kirk Ruths MD 03/08/2014, 12:53 PM

## 2014-03-08 NOTE — Progress Notes (Signed)
Patient arrived to floor by stretcher. Pt alert and oriented. Calm and cooperative.

## 2014-03-08 NOTE — Evaluation (Signed)
Physical Therapy Evaluation Patient Details Name: Marella ChimesGeorge F Stewart MRN: [id number] DOB: 1951-04-10 Today's Date: 03/08/2014   History of Present Illness  63 yo male admitted with R side weakness and numbness, CP and tremors.  Pt with significant cardiac history and had CVA in 4/15 with residual R weakness and numbness.  Pt being worked up for TIA.  Clinical Impression  Pt admitted with/for worsening R sided weakness.  Pt currently limited functionally due to the problems listed below.  (see problems list.)  Pt will benefit from PT to maximize function and safety to be able to get home safely with available assist of family.     Follow Up Recommendations Supervision for mobility/OOB;Other (comment) (OP ideally, but pt feels insurance may not cover venue.)    Equipment Recommendations  None recommended by PT    Recommendations for Other Services       Precautions / Restrictions Precautions Precautions: Fall Precaution Comments: Pt states he is always dizzy when he first stands up and usually waits a minute or two before moving.  this is premorbid. Restrictions Weight Bearing Restrictions: No      Mobility  Bed Mobility Overal bed mobility: Independent                Transfers Overall transfer level: Needs assistance Equipment used: 1 person hand held assist Transfers: Sit to/from Stand           General transfer comment: min guard to get his bearings.  Pt used bed frame to support himself intially.  Ambulation/Gait Ambulation/Gait assistance: Min guard (min initially due to staggering getting his bearings) Ambulation Distance (Feet): 700 Feet Assistive device: Straight cane Gait Pattern/deviations: Step-through pattern Gait velocity: able to increase speed to neighborhood level subjectively Gait velocity interpretation: at or above normal speed for age/gender General Gait Details: Initially unsteady and staggering to maintain balance, then progressively more  steady and able to maintain balance with higher level challenges  Stairs Stairs: Yes Stairs assistance: Supervision Stair Management: One rail Right;Step to pattern;Forwards Number of Stairs: 6 General stair comments: steady and safe with rail  Wheelchair Mobility    Modified Rankin (Stroke Patients Only) Modified Rankin (Stroke Patients Only) Pre-Morbid Rankin Score: Moderate disability Modified Rankin: Moderately severe disability     Balance Overall balance assessment: Needs assistance Sitting-balance support: Feet supported;No upper extremity supported Sitting balance-Leahy Scale: Good Sitting balance - Comments: handles moderate perturbation and lost balance with max.   Standing balance support: No upper extremity supported Standing balance-Leahy Scale: Fair               High level balance activites: Backward walking;Direction changes;Turns;Sudden stops;Head turns;Other (comment) (with use of cane) High Level Balance Comments: progressively better control of balance as challenges progressed Standardized Balance Assessment Standardized Balance Assessment : Dynamic Gait Index   Dynamic Gait Index Level Surface: Mild Impairment Change in Gait Speed: Mild Impairment Gait with Horizontal Head Turns: Mild Impairment Gait with Vertical Head Turns: Mild Impairment Gait and Pivot Turn: Mild Impairment Steps: Mild Impairment       Pertinent Vitals/Pain     Home Living Family/patient expects to be discharged to:: Private residence Living Arrangements: Spouse/significant other;Children Available Help at Discharge: Family;Available 24 hours/day Type of Home: House Home Access: Stairs to enter Entrance Stairs-Rails: Right Entrance Stairs-Number of Steps: 3 Home Layout: One level Home Equipment: Gilmer MorCane - single point Additional Comments: Pt has had 24 hour S from wife since last CVA in April.    Prior Function  Level of Independence: Independent with assistive  device(s)         Comments: pt uses cane at all times since cva in april.     Hand Dominance   Dominant Hand: Right    Extremity/Trunk Assessment   Upper Extremity Assessment: Defer to OT evaluation RUE Deficits / Details: Pt overall R strength throughout is 3+/5.  pt reports numbness in RUE although is functional using it.   RUE Sensation: decreased light touch     Lower Extremity Assessment: RLE deficits/detail RLE Deficits / Details: Grossly 4/5 quads, hams, hip flexors, df 4-/5    Cervical / Trunk Assessment: Other exceptions (Core muscle weakness.)  Communication   Communication: No difficulties  Cognition Arousal/Alertness: Awake/alert Behavior During Therapy: WFL for tasks assessed/performed Overall Cognitive Status: Within Functional Limits for tasks assessed                      General Comments      Exercises        Assessment/Plan    PT Assessment Patient needs continued PT services  PT Diagnosis Abnormality of gait (paresis on dominant side)   PT Problem List Decreased strength;Decreased activity tolerance;Decreased mobility;Decreased balance  PT Treatment Interventions Gait training;Functional mobility training;Therapeutic activities;Balance training;Patient/family education   PT Goals (Current goals can be found in the Care Plan section) Acute Rehab PT Goals Patient Stated Goal: to find out why this keeps happening. PT Goal Formulation: With patient Time For Goal Achievement: 03/15/14 Potential to Achieve Goals: Good    Frequency Min 3X/week   Barriers to discharge        Co-evaluation               End of Session   Activity Tolerance: Patient tolerated treatment well Patient left: in bed;with call bell/phone within reach;with family/visitor present Nurse Communication: Mobility status         Time: [id number] PT Time Calculation (min): 25 min   Charges:   PT Evaluation $Initial PT Evaluation Tier I: 1  Procedure PT Treatments $Gait Training: 8-22 mins   PT G Codes:          Klea Nall, Eliseo GumKenneth V 03/08/2014, 5:13 PM 03/08/2014  Hood BingKen Detra Bores, PT [phone]  (pager)

## 2014-03-08 NOTE — Evaluation (Signed)
Speech Language Pathology Evaluation Patient Details Name: Marella ChimesGeorge F Madera MRN: [id number] DOB: [birth year] Today's Date: 03/08/2014 Time: 0930-1004 SLP Time Calculation (min): 34 min  Problem List:  Patient Active Problem List   Diagnosis Date Noted  . TIA (transient ischemic attack) 03/07/2014  . Burning or prickling sensation 01/06/2014  . Temporary cerebral vascular dysfunction 01/06/2014  . CVA (cerebral infarction) 12/29/2013  . Hypertension 12/28/2013  . Other and unspecified hyperlipidemia 12/28/2013  . Brainstem infarct not seen on MRI 12/27/2013  . Bad memory 11/18/2013  . Special screening for malignant neoplasm of prostate 11/18/2013  . Ache in joint 07/12/2013  . Clinical depression 03/09/2013  . Generalized OA 03/09/2013  . Numbness and tingling 01/12/2013  . Peripheral vascular disease, unspecified 01/12/2013  . Pain in limb 01/12/2013  . Atherosclerosis of native arteries of the extremities with intermittent claudication 01/12/2013  . Hypoglycemia 11/05/2012  . Atherosclerosis of coronary artery 07/04/2011  . Artery disease, cerebral 07/04/2011  . Acid reflux 05/31/2010   Past Medical History:  Past Medical History  Diagnosis Date  . Peripheral arterial disease   . Hyperlipidemia   . Coronary artery disease   . Hypertension   . Barrett's esophagus   . Gastroesophageal reflux   . Cerebrovascular disease   . Myocardial infarction 2009    "during cardiac cath" (01/27/2013)  . H/O hiatal hernia   . WUJWJXBJ(478.2Headache(784.0)     "probably monthly" (01/27/2013)  . Seizures     "when I had my heart attack in 2009" (01/27/2013)  . 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 Arthritis     "hands" (01/27/2013)   Past Surgical History:  Past Surgical History  Procedure Laterality Date  . Laparoscopic gastric banding  2010  . Cardiac catheterization    .  Coronary angioplasty with stent placement  11/02/2007    "2" (01/27/2013)  . Laparoscopic nissen fundoplication  ?2001  . Femoral artery stent Right 01/26/2013  . Diagnostic laparoscopy      "twice after nissen; it kept coming apart " (01/27/2013)   HPI:  63 y.o. year old male with significant past medical history of CAD s/p stenting, PVD s/p femoral stenting, Hx/o TIA/CVA s/p tPa 4/15, seizures, depression presenting with R arm weakness, chest pain, tremors. Per report, pt was noticed by wife to start suddenly trembling while sitting on the couch in addition to becoming diaphoretic with weakness/numbness on R side. EMS called by wife. Pt brought to ER as code stroke. Pt reports that he developed some CP, dyspnea, diaphoresid while en route to ER. Was previously on seizure medication. However, this was d/c'd years ago per pt. No LOC assd with tremor at home. MRI 7/6 revealed no acute intracranial infarct and mild microvascular ischemic changes. Speech eval ordered d/t concerns for TIA.   Assessment / Plan / Recommendation Clinical Impression  Pt currently demonstrates language, cognition, and motor speech skills within functional limits for tasks assessed (naming, problem solving, ST memory, sequencing, direction following, conversational speech, attention). He describes having some difficulties in conversational speech- some pauses/ hesitations- which are ongoing from stroke in April; however these are very minimally evident during conversation. Given these findings, pt does not have acute needs for speech therapy at this time. However, educated pt on options such as home health or outpatient therapy if frustrations continue with speech hesitancy. Speech therapy will  sign off at this time. Please re-consult if needs arise.    SLP Assessment  Patient does not need any further Speech Lanaguage Pathology Services    Follow Up Recommendations  None    Frequency and Duration        Pertinent  Vitals/Pain n/a   SLP Goals     SLP Evaluation Prior Functioning  Cognitive/Linguistic Baseline: Baseline deficits Baseline deficit details: Pt reported mild difficulties from stroke in April described as hesitancies in his speech. Type of Home: House  Lives With: Spouse Available Help at Discharge: Family;Available 24 hours/day Vocation: Other (comment) (trying to apply for disability; was a Psychologist, occupationalwelder)   Cognition  Overall Cognitive Status: Within Functional Limits for tasks assessed Arousal/Alertness: Awake/alert Orientation Level: Oriented X4 Attention: Selective Selective Attention: Appears intact Memory: Appears intact Awareness: Appears intact Problem Solving: Appears intact Executive Function: Sequencing;Organizing Sequencing: Appears intact Organizing: Appears intact Safety/Judgment: Appears intact    Comprehension  Auditory Comprehension Overall Auditory Comprehension: Appears within functional limits for tasks assessed Yes/No Questions: Within Functional Limits Commands: Within Functional Limits Conversation: Complex Reading Comprehension Reading Status: Unable to assess (comment) (did not have glasses)    Expression Expression Primary Mode of Expression: Verbal Verbal Expression Overall Verbal Expression: Appears within functional limits for tasks assessed Initiation: No impairment Level of Generative/Spontaneous Verbalization: Conversation Repetition: No impairment Naming: No impairment Pragmatics: No impairment Non-Verbal Means of Communication: Not applicable Written Expression Dominant Hand: Right   Oral / Motor Oral Motor/Sensory Function Overall Oral Motor/Sensory Function: Appears within functional limits for tasks assessed Motor Speech Overall Motor Speech: Appears within functional limits for tasks assessed Phonation: Hoarse   GO Functional Assessment Tool Used: clinical judgment Functional Limitations: Spoken language expressive Spoken  Language Expression Current Status [phone](G9162): At least 1 percent but less than 20 percent impaired, limited or restricted Spoken Language Expression Goal Status [phone](G9163): At least 1 percent but less than 20 percent impaired, limited or restricted Spoken Language Expression Discharge Status [phone](G9164): At least 1 percent but less than 20 percent impaired, limited or restricted   [place] KungOleksiak, Amy K, MA, CCC-SLP 03/08/2014, 10:16 AM

## 2014-03-08 NOTE — Progress Notes (Signed)
*  PRELIMINARY RESULTS* Echocardiogram 2D Echocardiogram has been performed.  Jeryl ColumbiaLLIOTT, Denae Zulueta 03/08/2014, 4:00 PM

## 2014-03-08 NOTE — Evaluation (Signed)
Occupational Therapy Evaluation Patient Details Name: Justin Wells MRN: [id number] DOB: 08-Nov-1950 Today's Date: 03/08/2014    History of Present Illness 63 yo male admitted with R side weakness and numbness, CP and tremors.  Pt with significant cardiac history and had CVA in 4/15 with residual R weakness and numbness.  Pt being worked up for TIA.   Clinical Impression   Pt admitted with the above diagnosis and has the deficits listed below.  Pt would benefit from cont acute OT to increase strength and coordination in RUE for increased function of the RUE during adls and to increase compensation techniques for loss of peripheral vision on the R side to ensure safety at home.    Follow Up Recommendations  Supervision/Assistance - 24 hour;Outpatient OT Ideally, pt would benefit from OP but pt feels his insurance may not cover this.   Equipment Recommendations       Recommendations for Other Services       Precautions / Restrictions Precautions Precautions: Fall Precaution Comments: Pt states he is always dizzy when he first stands up and usually waits a minute or two before moving.  this is premorbid. Restrictions Weight Bearing Restrictions: No      Mobility Bed Mobility Overal bed mobility: Independent                Transfers Overall transfer level: Needs assistance Equipment used: 1 person hand held assist Transfers: Sit to/from Stand Sit to Stand: Min guard         General transfer comment: min guard to get his bearings.  pt had not been up prior to this attempt.  Pt states he always gets dizzy when first rising.    Balance Overall balance assessment: Needs assistance Sitting-balance support: Feet supported Sitting balance-Leahy Scale: Good     Standing balance support: Bilateral upper extremity supported;During functional activity Standing balance-Leahy Scale: Fair Standing balance comment: At first, pt with LOB when standing.  Once given a moment to  get is bearings pt could take challenges but did not do well with challenges making him move backward.  Pt able to catch self laterally but not posteriorly.                            ADL Overall ADL's : Needs assistance/impaired Eating/Feeding: Set up;Sitting   Grooming: Set up;Standing;Supervision/safety Grooming Details (indicate cue type and reason): S only when standing. Upper Body Bathing: Set up;Sitting   Lower Body Bathing: Min guard;Sit to/from stand Lower Body Bathing Details (indicate cue type and reason): min guard only for standing balance. Upper Body Dressing : Set up;Sitting   Lower Body Dressing: Min guard;Sit to/from stand Lower Body Dressing Details (indicate cue type and reason): min guard only needed when standing. Toilet Transfer: Minimal assistance;Ambulation;Comfort height toilet Toilet Transfer Details (indicate cue type and reason): pt walked to br with [facility] Toileting- Clothing Manipulation and Hygiene: Min guard;Sit to/from stand Toileting - Clothing Manipulation Details (indicate cue type and reason): assist only needed in standing.     Functional mobility during ADLs: Min guard General ADL Comments: Pt uses cane at home.  PT told to bring cane for their eval.  Feel he will be even more I when he has a cane for mobility.     Vision Eye Alignment: Within Functional Limits Alignment/Gaze Preference: Within Defined Limits Ocular Range of Motion: Within Functional Limits Tracking/Visual Pursuits: Able to track stimulus in all quads without difficulty  Convergence: Within functional limits     Additional Comments: Pt has mild loss of peripheral vision on the R.  Pt can see peripherally to about60 degrees off midline but it is very blurry.  60-90 degrees is missing.   Perception Perception Perception Tested?: Yes   Praxis      Pertinent Vitals/Pain Pt c/o no pain and vitals were stable.     Hand Dominance Right   Extremity/Trunk  Assessment Upper Extremity Assessment Upper Extremity Assessment: RUE deficits/detail RUE Deficits / Details: Pt overall R strength throughout is 3+/5.  pt reports numbness in RUE although is functional using it. RUE Sensation: decreased light touch RUE Coordination: decreased fine motor   Lower Extremity Assessment Lower Extremity Assessment: Defer to PT evaluation   Cervical / Trunk Assessment Cervical / Trunk Assessment: Normal   Communication Communication Communication: No difficulties   Cognition Arousal/Alertness: Awake/alert Behavior During Therapy: WFL for tasks assessed/performed Overall Cognitive Status: Within Functional Limits for tasks assessed                     General Comments       Exercises       Shoulder Instructions      Home Living Family/patient expects to be discharged to:: Private residence Living Arrangements: Spouse/significant other;Children Available Help at Discharge: Family;Available 24 hours/day Type of Home: House Home Access: Stairs to enter [employer] of Steps: 3 Entrance Stairs-Rails: Right Home Layout: One level     Bathroom Shower/Tub: Walk-in shower;Tub/shower unit;Curtain Shower/tub characteristics: Engineer, building services: Standard     Home Equipment: Gilmer Mor - single point   Additional Comments: Pt has had 24 hour S from wife since last CVA in April.  Lives With: Spouse    Prior Functioning/Environment Level of Independence: Independent with assistive device(s)        Comments: pt uses cane at all times since cva in april.    OT Diagnosis: Generalized weakness;Hemiplegia dominant side   OT Problem List: Decreased strength;Decreased coordination;Impaired vision/perception;Impaired UE functional use   OT Treatment/Interventions: Self-care/ADL training;Therapeutic activities    OT Goals(Current goals can be found in the care plan section) Acute Rehab OT Goals Patient Stated Goal: to find out why  this keeps happening. OT Goal Formulation: With patient Time For Goal Achievement: 03/15/14 Potential to Achieve Goals: Good ADL Goals Pt/caregiver will Perform Home Exercise Program: Increased strength;Right Upper extremity;Independently Additional ADL Goal #1: Pt will walk in hallway and locate items on his R side w/o cues and pick them up off of the floor w S. Additional ADL Goal #2: Pt will ambulate in room to retrieve all clothes with cane and S.  OT Frequency: Min 2X/week   Barriers to D/C:            Co-evaluation              End of Session Nurse Communication: Mobility status  Activity Tolerance: Patient tolerated treatment well Patient left: in chair;with call bell/phone within reach;with family/visitor present   Time: 1610-9604 OT Time Calculation (min): 41 min Charges:  OT General Charges $OT Visit: 1 Procedure OT Evaluation $Initial OT Evaluation Tier I: 1 Procedure OT Treatments $Self Care/Home Management : 23-37 mins $Therapeutic Activity: 23-37 mins G-Codes: OT G-codes **NOT FOR INPATIENT CLASS** Functional Assessment Tool Used: clinical judgement Functional Limitation: Self care Self Care Current Status (V4098): At least 1 percent but less than 20 percent impaired, limited or restricted Self Care Goal Status (J1914): At least 1 percent  but less than 20 percent impaired, limited or restricted  Hope BuddsJones, Lowery Paullin Anne 03/08/2014, 1:48 PM [phone]

## 2014-03-08 NOTE — Progress Notes (Signed)
Was called to patient room wife stated that patient is having his spell. Checked patient vitals and they were normal BP 116/69 temp 97.9 and pulse 76. Patient is now having trembles. Will continue to monitor.

## 2014-03-08 NOTE — Progress Notes (Signed)
Chaplain was requested by patient and wife for a more detailed explanation of AD. Chaplain took a copy of AD to pt and wife. Explained what AD detailed. Pt wanted to include financial decisions. Chaplain advised Pt and wife that a different document needed to be developed and notarized for financial power of attorney. Chaplain spent time listening to pt and wife and provided prayer. Chaplain left a blank copy of AD for wife.  Cindie CrumblyBeverley Briany Aye, Chaplin  03/08/14 1300  Clinical Encounter Type  Visited With Patient and family together  Visit Type Other (Comment)  Referral From Family  Consult/Referral To Chaplain  Spiritual Encounters  Spiritual Needs Prayer;Other (Comment)  Stress Factors  Patient Stress Factors None identified  Family Stress Factors None identified

## 2014-03-08 NOTE — Progress Notes (Signed)
Stroke Team Progress Note  HISTORY Chief Complaint: CODE STROKE: right arm weakness-numbness, chest pain, tremors  HPI:  BIJAN RIDGLEY is an 63 y.o. male with a past medical history significant for HTN, hyperlipidemia, CAD s/p stent deployment x 3, MI, PAD s/p femoral stenting, TIA's, ischemic stroke in the setting of acute MI 2014, anxiety, brought to [facility] ED by ambulance as a code stroke due to acute onset of the above stated symptoms.  Wife said that Mr. Specht was home sitting in a couch when suddenly started trembling, became sweaty, clammy, and then his right side was weak and numb and this EMS was summoned.  He developed chest pain and shortness of breath on route.  NIHSS 2 on initial assessment.  CT brain showed no acute abnormality.  CTA chest negative.  He is on plavix.  Patient reports a lot of anxiety but denies HA, vertigo, double vision, difficulty swallowing, slurred speech, or language impairment. York Spaniel that he can not see on the right eye and still c/o chest pain  Date last known well: 03/07/14  Time last known well: 1900  tPA Given: no, mild deficits, recent IV tpa/? Stroke 4/15  NIHSS: 2   Patient was not administered TPA secondary to mild deficits. He was admitted to the Internal Medicine Service for further evaluation and treatment. Neurology is consulting.  SUBJECTIVE No acute events overnight. The patient is alert and conversant, and complains of pain in his neck. States that if he tilts his head back too far he feels as though he will pass out. Very limited range of motion at neck and painful on movement. Had extensive stroke work up in April and May, had CTA head and neck, as well as MRI head and neck, but no revealing. Followed up in clinic with Dr. [name], consider complicated migraine but pt stated that the topamax does not work.  OBJECTIVE Most recent Vital Signs: Filed Vitals:   03/08/14 0100 03/08/14 0300 03/08/14 0500 03/08/14 0659  BP: 119/64 110/65 121/74  111/58  Pulse:      Temp: 97.8 F (36.6 C) 98 F (36.7 C) 97.9 F (36.6 C) 98 F (36.7 C)  TempSrc:  Oral Oral Oral  Resp: 16 14 16 14   Height:      Weight:      SpO2: 97% 95% 95% 95%   CBG (last 3)  No results found for this basename: GLUCAP,  in the last 72 hours  IV Fluid Intake:     MEDICATIONS  . aspirin EC  81 mg Oral Daily  . clopidogrel  75 mg Oral Daily  . enoxaparin (LOVENOX) injection  40 mg Subcutaneous Q24H  . famotidine  20 mg Oral BID  . lisinopril  5 mg Oral Daily  . multivitamin with minerals  1 tablet Oral Daily  . omega-3 acid ethyl esters  1 g Oral Daily  . PARoxetine  20 mg Oral Daily   PRN:  cyclobenzaprine, nitroGLYCERIN, oxyCODONE-acetaminophen, senna-docusate  Diet:  Cardiac thin liquids Activity: As tolerated DVT Prophylaxis:  Lovenox  CLINICALLY SIGNIFICANT STUDIES Basic Metabolic Panel:  Recent Labs Lab 03/07/14 2016 03/07/14 2025  NA 139 137  K 4.4 4.1  CL 100 103  CO2 25  --   GLUCOSE 94 95  BUN 14 13  CREATININE 1.08 1.20  CALCIUM 9.0  --    Liver Function Tests:  Recent Labs Lab 03/07/14 2016  AST 24  ALT 20  ALKPHOS 86  BILITOT 0.3  PROT 6.9  ALBUMIN 3.9   CBC:  Recent Labs Lab 03/07/14 2016 03/07/14 2025  WBC 9.5  --   NEUTROABS 6.2  --   HGB 14.1 14.6  HCT 40.8 43.0  MCV 85.9  --   PLT 205  --    Coagulation:  Recent Labs Lab 03/07/14 2016  LABPROT 13.5  INR 1.03   Cardiac Enzymes:  Recent Labs Lab 03/07/14 2200 03/08/14 0440  TROPONINI <0.30 <0.30   Urinalysis:  Recent Labs Lab 03/07/14 2107  COLORURINE YELLOW  LABSPEC 1.028  PHURINE 5.5  GLUCOSEU NEGATIVE  HGBUR NEGATIVE  BILIRUBINUR NEGATIVE  KETONESUR NEGATIVE  PROTEINUR NEGATIVE  UROBILINOGEN 1.0  NITRITE NEGATIVE  LEUKOCYTESUR NEGATIVE   Lipid Panel    Component Value Date/Time   CHOL 151 03/08/2014 0440   TRIG 160* 03/08/2014 0440   HDL 40 03/08/2014 0440   CHOLHDL 3.8 03/08/2014 0440   VLDL 32 03/08/2014 0440   LDLCALC 79  03/08/2014 0440   HgbA1C  Lab Results  Component Value Date   HGBA1C 5.4 12/28/2013    Urine Drug Screen:     Component Value Date/Time   LABOPIA POSITIVE* 03/07/2014 2107   COCAINSCRNUR NONE DETECTED 03/07/2014 2107   LABBENZ NONE DETECTED 03/07/2014 2107   AMPHETMU NONE DETECTED 03/07/2014 2107   THCU NONE DETECTED 03/07/2014 2107   LABBARB NONE DETECTED 03/07/2014 2107    Alcohol Level:  Recent Labs Lab 03/07/14 2016  ETH <11    Dg Chest 1 View  03/07/2014 IMPRESSION: No active disease.     CT Head Wo Contrast  03/07/2014 IMPRESSION: No acute intracranial abnormality.  Stable appearance from priors.     CT Angio Neck  12/28/13 CTA NECK :  Aberrant origin of the right subclavian artery.  Right vertebral artery and right common carotid artery with common  origin.  Left vertebral artery arises directly from the aortic arch with mild  narrowing proximal aspect.  Plaque carotid bifurcation bilaterally with less than 50% diameter  stenosis bilaterally.  Scattered normal to top-normal size lymph nodes throughout the neck  of questionable significance/etiology.  Circumferential thickening of the cervical/ upper thoracic esophagus  may be related to under distension although evaluation is limited.  MRI/MRA Brain  03/07/2014  IMPRESSION: 1. Stable exam with no acute intracranial infarct or other abnormality. 2. Mild chronic microvascular ischemic changes.     MRI Head/C Spine  01/14/14 IMPRESSION:  No acute infarct. Mild chronic microvascular ischemia.  Chronic sinusitis.  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 No acute disc protrusion.   Ct Angio Chest Aortic Dissect W &/or W/o  03/07/2014 IMPRESSION: No evidence of thoracic or abdominal aortic aneurysm or dissection.  Stable hiatal hernia compared to prior exam.  Cholelithiasis.  Stable mild bilateral fat containing inguinal hernias.    Ct Angio Abd/pel W/ And/or W/o  03/07/2014 IMPRESSION: No evidence  of thoracic or abdominal aortic aneurysm or dissection.  Stable hiatal hernia compared to prior exam.  Cholelithiasis.  Stable mild bilateral fat containing inguinal hernias.     2D Echocardiogram pending  EKG  03/07/14 Sinus rhythm RSR' in V1 or V2, right VCD or RVH For complete results please see formal report.   Therapy Recommendations Speech recs: no need for ongoing therapy  Physical Exam Blood pressure 111/58, pulse 68, temperature 98 F (36.7 C), temperature source Oral, resp. rate 14, height 5\' 10"  (1.778 m), weight 95.709 kg (211 lb), SpO2 95.00%. Gen: Patient is well developed, well nourished man in  no acute distress.  Cardiac: RRR. S1S2 audible.   Extremities: Cap refill <2 secs. No cyanosis or edema. Pulses 2+ radial and DP. Pulmonary: Respirations regular, symmetric. Lungs clear to auscultation bilat. Abd: Soft, non-tender. BS audible x 4 quadrants.  G/U: Deferred  MS: Alert, follows commands. Oriented to person, place, time, and event. Speech: Speech fluent and non-dysarthric. Able to name and repeat.   CN: No visual field cut. PERRL. EOMs intact. Facial sensation decreased on the left but split at midline. No facial droop. Hearing diminished in R ear. Strong cough. Sternocleidomastoids 5/5 strength. Tongue midline, full strength, no atrophy or fasciculations.  Strength: 5/5 in LUE and LLE proximally and distally. RUE with drift but no pronation. RLE 4+/5, giveway weakness.  Sensation: Intact to light touch and temp in L face, arm,and leg. Diminished to light touch and temp in R face, arm, and leg.  Coordination/Proprioception: No ataxia or dysmetria on FTN or HTS bilat.   Did not test gait or Romberg.   Reflexes: 1+ biceps, brachioradialis bilat. 3+ patellar, 2+ achilles bilat. Downgoing toes bilat.  NIHSS 1  ASSESSMENT/PLAN Mr. Marella ChimesGeorge F Munch is a 63 y.o. male with hx of stroke risk factors including HTN, hyperlipidemia, CAD s/p stent deployment x 3, MI, PAD s/p femoral  stenting, TIA's, and prior ischemic stroke in the setting of acute MI in 2014. He presented with right arm weakness-numbness, chest pain, tremors. Patient was not given tPA due to mild symptoms.  Imaging confirms no acute infarct but mild chronic microvascular ischemic changes. On aspirin 81 mg orally every day and clopidogrel 75 mg orally every day prior to admission. Now on aspirin 81 mg orally every day and clopidogrel 75 mg orally every day for secondary stroke prevention. Patient currently complains of neck pain, weakness and numbness of the R arm and R leg, as well as hearing loss in the R ear and dizziness when tilting head back. Not consistent with seizure, no need EEG. Stroke up so far not revealing. CTA head neck not flowing limiting. MRI brain and C-spine unremarkable, even did not see evidence of prior stroke. He has a lot of stress which may explain his functional component. On paxil for depression. Will consider to start clonazepam 0.25mg  bid for panic attack.    TIA: Symptoms suggestive of vertebrobasilar insufficiency  MRI/MRA negative for acute stroke or significant stenosis  CTA neck done in April shows no hemodynamically significant vertebral stenosis or hypoplastic artery  MRI C spine in May shows cervical spondylosis most prominent at C4-5 with mild spinal stenosis but no acute disc protrusion.  LDL 79, WNL  HgbA1c 5.4, WNL  2D Echo pending  Lovenox for DVT prophylaxis  Consider soft C-collar for his limited range of neck movement  Recommend clonazepam 0.25mg  bid.   Hospital day # 1  SIGNED Stephani Policeeirdre Fraller, MSN, ANP-C, CNRN, MSCS [facility] [phone]  I, the attending vascular neurologist, have personally obtained a history, examined the patient, evaluated laboratory data and imaging studies, and formulated the assessment and plan of care.  I have made any additions or clarifications directly to the above note and agree with the findings and plan as  currently documented.  Marvel PlanJindong Jynesis Nakamura, MD PhD 03/08/2014 11:01 PM           To contact Stroke Continuity provider, please refer to [email or web address]. After hours, contact General Neurology

## 2014-03-08 NOTE — Progress Notes (Signed)
Utilization Review Completed.Justin Wells T7/03/2014  

## 2014-03-09 ENCOUNTER — Telehealth: Payer: Self-pay | Admitting: Neurology

## 2014-03-09 ENCOUNTER — Encounter: Payer: Self-pay | Admitting: Neurology

## 2014-03-09 ENCOUNTER — Encounter (HOSPITAL_COMMUNITY)
Admission: EM | Disposition: A | Discharge status: Home / Self Care | Payer: Self-pay | Source: Home / Self Care | Attending: Emergency Medicine

## 2014-03-09 DIAGNOSIS — F410 Panic disorder [episodic paroxysmal anxiety] without agoraphobia: Secondary | ICD-10-CM | POA: Diagnosis not present

## 2014-03-09 DIAGNOSIS — M542 Cervicalgia: Secondary | ICD-10-CM

## 2014-03-09 DIAGNOSIS — R079 Chest pain, unspecified: Secondary | ICD-10-CM

## 2014-03-09 DIAGNOSIS — I2510 Atherosclerotic heart disease of native coronary artery without angina pectoris: Secondary | ICD-10-CM

## 2014-03-09 DIAGNOSIS — I10 Essential (primary) hypertension: Secondary | ICD-10-CM

## 2014-03-09 DIAGNOSIS — R29898 Other symptoms and signs involving the musculoskeletal system: Secondary | ICD-10-CM | POA: Diagnosis not present

## 2014-03-09 DIAGNOSIS — I209 Angina pectoris, unspecified: Secondary | ICD-10-CM

## 2014-03-09 HISTORY — PX: LEFT HEART CATHETERIZATION WITH CORONARY ANGIOGRAM: SHX5451

## 2014-03-09 SURGERY — LEFT HEART CATHETERIZATION WITH CORONARY ANGIOGRAM
Anesthesia: LOCAL

## 2014-03-09 MED ORDER — SODIUM CHLORIDE 0.9 % IV SOLN
INTRAVENOUS | Status: AC
  Administered 2014-03-09: 11:00:00 via INTRAVENOUS

## 2014-03-09 MED ORDER — CLONAZEPAM 0.5 MG PO TABS
0.2500 mg | ORAL_TABLET | Freq: Two times a day (BID) | ORAL | Status: DC
  Administered 2014-03-09 – 2014-03-10 (×2): 0.25 mg via ORAL
  Filled 2014-03-09 (×2): qty 1

## 2014-03-09 MED ORDER — HEPARIN SODIUM (PORCINE) 1000 UNIT/ML IJ SOLN
INTRAMUSCULAR | Status: AC
  Filled 2014-03-09: qty 1

## 2014-03-09 MED ORDER — OXYCODONE-ACETAMINOPHEN 5-325 MG PO TABS
1.0000 | ORAL_TABLET | ORAL | Status: DC | PRN
  Administered 2014-03-09 – 2014-03-10 (×5): 1 via ORAL
  Filled 2014-03-09 (×5): qty 1

## 2014-03-09 MED ORDER — MORPHINE SULFATE 2 MG/ML IJ SOLN
0.5000 mg | Freq: Once | INTRAMUSCULAR | Status: AC
  Administered 2014-03-09: 0.5 mg via INTRAVENOUS
  Filled 2014-03-09: qty 1

## 2014-03-09 MED ORDER — FENTANYL CITRATE 0.05 MG/ML IJ SOLN
INTRAMUSCULAR | Status: AC
  Filled 2014-03-09: qty 2

## 2014-03-09 MED ORDER — NITROGLYCERIN 0.2 MG/ML ON CALL CATH LAB
INTRAVENOUS | Status: AC
  Filled 2014-03-09: qty 1

## 2014-03-09 MED ORDER — VERAPAMIL HCL 2.5 MG/ML IV SOLN
INTRAVENOUS | Status: AC
  Filled 2014-03-09: qty 2

## 2014-03-09 MED ORDER — LIDOCAINE HCL (PF) 1 % IJ SOLN
INTRAMUSCULAR | Status: AC
  Filled 2014-03-09: qty 30

## 2014-03-09 MED ORDER — HEPARIN (PORCINE) IN NACL 2-0.9 UNIT/ML-% IJ SOLN
INTRAMUSCULAR | Status: AC
  Filled 2014-03-09: qty 1000

## 2014-03-09 MED ORDER — MIDAZOLAM HCL 2 MG/2ML IJ SOLN
INTRAMUSCULAR | Status: AC
  Filled 2014-03-09: qty 2

## 2014-03-09 NOTE — Progress Notes (Signed)
SUBJECTIVE:  Had some more CP last night  OBJECTIVE:   Vitals:   Filed Vitals:   03/08/14 1903 03/08/14 2203 03/09/14 0220 03/09/14 0611  BP:  125/79 102/56 122/79  Pulse:  66 63 62  Temp:  98.4 F (36.9 C) 97.3 F (36.3 C) 97.9 F (36.6 C)  TempSrc:  Oral Oral Oral  Resp:  18 18 18   Height:      Weight: 207 lb 3.7 oz (94 kg)     SpO2:  98% 98% 97%   I&O's:   Intake/Output Summary (Last 24 hours) at 03/09/14 [id number] Last data filed at 03/08/14 1300  Gross per 24 hour  Intake    240 ml  Output      0 ml  Net    240 ml   TELEMETRY: Reviewed telemetry pt in NSR:     PHYSICAL EXAM General: Well developed, well nourished, in no acute distress Head: Eyes PERRLA, No xanthomas.   Normal cephalic and atramatic  Lungs:   Clear bilaterally to auscultation and percussion. Heart:   HRRR S1 S2 Pulses are 2+ & equal. Abdomen: Bowel sounds are positive, abdomen soft and non-tender without masses Extremities:   No clubbing, cyanosis or edema.  DP +1 Neuro: Alert and oriented X 3. Psych:  Good affect, responds appropriately   LABS: Basic Metabolic Panel:  Recent Labs  96/12/5405/06/15 2016 03/07/14 2025  NA 139 137  K 4.4 4.1  CL 100 103  CO2 25  --   GLUCOSE 94 95  BUN 14 13  CREATININE 1.08 1.20  CALCIUM 9.0  --    Liver Function Tests:  Recent Labs  03/07/14 2016  AST 24  ALT 20  ALKPHOS 86  BILITOT 0.3  PROT 6.9  ALBUMIN 3.9   No results found for this basename: LIPASE, AMYLASE,  in the last 72 hours CBC:  Recent Labs  03/07/14 2016 03/07/14 2025  WBC 9.5  --   NEUTROABS 6.2  --   HGB 14.1 14.6  HCT 40.8 43.0  MCV 85.9  --   PLT 205  --    Cardiac Enzymes:  Recent Labs  03/07/14 2200 03/08/14 0440 03/08/14 0958  TROPONINI <0.30 <0.30 <0.30   BNP: No components found with this basename: POCBNP,  D-Dimer: No results found for this basename: DDIMER,  in the last 72 hours Hemoglobin A1C:  Recent Labs  03/08/14 0440  HGBA1C 5.6   Fasting  Lipid Panel:  Recent Labs  03/08/14 0440  CHOL 151  HDL 40  LDLCALC 79  TRIG 160*  CHOLHDL 3.8   Thyroid Function Tests: No results found for this basename: TSH, T4TOTAL, FREET3, T3FREE, THYROIDAB,  in the last 72 hours Anemia Panel: No results found for this basename: VITAMINB12, FOLATE, FERRITIN, TIBC, IRON, RETICCTPCT,  in the last 72 hours Coag Panel:   Lab Results  Component Value Date   INR 1.03 03/07/2014   INR 1.01 01/14/2014   INR 0.97 12/27/2013    RADIOLOGY: Dg Chest 1 View  03/07/2014   CLINICAL DATA:  Chest pain.  Shortness of breath.  Previous stroke.  EXAM: CHEST - 1 VIEW  COMPARISON:  12/27/2013  FINDINGS: The heart size and mediastinal contours are within normal limits. Both lungs are clear. The visualized skeletal structures are unremarkable.  IMPRESSION: No active disease.   Electronically Signed   By: Myles RosenthalJohn  Stahl M.D.   On: 03/07/2014 23:17   Ct Head Wo Contrast  03/07/2014   CLINICAL DATA:  Right arm and leg drift.  EXAM: CT HEAD WITHOUT CONTRAST  TECHNIQUE: Contiguous axial images were obtained from the base of the skull through the vertex without contrast.  COMPARISON:  MRI of the brain 01/14/2014.  CT head 01/14/2014.  FINDINGS: Normal appearance of the intracranial structures. No evidence for acute hemorrhage, mass lesion, midline shift, hydrocephalus or large infarct. No acute bony abnormality. The visualized sinuses are clear. Previous sinus disease has cleared.  IMPRESSION: No acute intracranial abnormality.  Stable appearance from priors.   Electronically Signed   By: Davonna Belling M.D.   On: 03/07/2014 21:05   Mr Brain Wo Contrast  03/07/2014   CLINICAL DATA:  Right-sided visual loss with blurry vision.  EXAM: MRI HEAD WITHOUT CONTRAST  TECHNIQUE: Multiplanar, multiecho pulse sequences of the brain and surrounding structures were obtained without intravenous contrast.  COMPARISON:  Prior CT from earlier the same day as well as prior MRI from 01/14/2014  FINDINGS:  Mild diffuse prominence of the CSF containing spaces is compatible with age-appropriate atrophy. Scattered T2/FLAIR subcentimeter hyperintensities within the periventricular and deep white matter both cerebral hemispheres are most consistent with mild chronic microvascular ischemic changes.  No mass lesion, midline shift, or extra-axial fluid collection. Ventricles are normal in size without evidence of hydrocephalus.  No diffusion-weighted signal abnormality is identified to suggest acute intracranial infarct. Gray-white matter differentiation is maintained. Normal flow voids are seen within the intracranial vasculature. No intracranial hemorrhage identified.  The cervicomedullary junction is normal. Pituitary gland is within normal limits. Pituitary stalk is midline. The globes and optic nerves demonstrate a normal appearance with normal signal intensity.  The bone marrow signal intensity is normal. Calvarium is intact. Visualized upper cervical spine is within normal limits.  Scalp soft tissues are unremarkable.  Mild T2 hyperintense mucosal thickening noted within the maxillary and ethmoidal sinuses bilaterally. Minimal mucosal thickening also present within the right frontal sinus. No mastoid effusion.  IMPRESSION: 1. Stable exam with no acute intracranial infarct or other abnormality. 2. Mild chronic microvascular ischemic changes.   Electronically Signed   By: Rise Mu M.D.   On: 03/07/2014 22:44   Ct Angio Chest Aortic Dissect W &/or W/o  03/07/2014   CLINICAL DATA:  Chest pain.  EXAM: CT ANGIOGRAPHY CHEST, ABDOMEN AND PELVIS  TECHNIQUE: Multidetector CT imaging through the chest, abdomen and pelvis was performed using the standard protocol during bolus administration of intravenous contrast. Multiplanar reconstructed images and MIPs were obtained and reviewed to evaluate the vascular anatomy.  CONTRAST:  OMNIPAQUE IOHEXOL 350 MG/ML SOLN  COMPARISON:  CT scan of December 27, 2013.  FINDINGS:  CTA CHEST FINDINGS  No pneumothorax or pleural effusion is noted. No acute pulmonary disease is noted. No mediastinal mass or adenopathy is noted mild coronary artery calcifications are noted. Moderate hiatal hernia is noted. There is no evidence of thoracic aortic dissection or aneurysm. 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 There is no evidence of pulmonary embolus.  Review of the MIP images confirms the above findings.  CTA ABDOMEN AND PELVIS FINDINGS  No focal abnormality is noted in the liver, spleen or pancreas. Cholelithiasis is noted. No evidence of aneurysm or dissection is seen involving the abdominal aorta. Atherosclerotic calcifications are noted in the abdominal aorta and iliac arteries. The mesenteric and renal arteries are widely patent without significant stenosis. Adrenal glands and kidneys appear normal. No hydronephrosis or renal obstruction is noted. No evidence of bowel obstruction is noted. The appendix  appears normal. Urinary bladder appears normal. Stable mild bilateral fat containing inguinal hernias are noted.  Review of the MIP images confirms the above findings.  IMPRESSION: No evidence of thoracic or abdominal aortic aneurysm or dissection.  Stable hiatal hernia compared to prior exam.  Cholelithiasis.  Stable mild bilateral fat containing inguinal hernias.   Electronically Signed   By: Roque LiasJames  Green M.D.   On: 03/07/2014 21:17   Ct Angio Abd/pel W/ And/or W/o  03/07/2014   CLINICAL DATA:  Chest pain.  EXAM: CT ANGIOGRAPHY CHEST, ABDOMEN AND PELVIS  TECHNIQUE: Multidetector CT imaging through the chest, abdomen and pelvis was performed using the standard protocol during bolus administration of intravenous contrast. Multiplanar reconstructed images and MIPs were obtained and reviewed to evaluate the vascular anatomy.  CONTRAST:  100mL OMNIPAQUE IOHEXOL 350 MG/ML SOLN  COMPARISON:  CT scan of December 27, 2013.  FINDINGS: CTA CHEST FINDINGS  No pneumothorax  or pleural effusion is noted. No acute pulmonary disease is noted. No mediastinal mass or adenopathy is noted mild coronary artery calcifications are noted. Moderate hiatal hernia is noted. There is no evidence of thoracic aortic dissection or aneurysm. 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 There is no evidence of pulmonary embolus.  Review of the MIP images confirms the above findings.  CTA ABDOMEN AND PELVIS FINDINGS  No focal abnormality is noted in the liver, spleen or pancreas. Cholelithiasis is noted. No evidence of aneurysm or dissection is seen involving the abdominal aorta. Atherosclerotic calcifications are noted in the abdominal aorta and iliac arteries. The mesenteric and renal arteries are widely patent without significant stenosis. Adrenal glands and kidneys appear normal. No hydronephrosis or renal obstruction is noted. No evidence of bowel obstruction is noted. The appendix appears normal. Urinary bladder appears normal. Stable mild bilateral fat containing inguinal hernias are noted.  Review of the MIP images confirms the above findings.  IMPRESSION: No evidence of thoracic or abdominal aortic aneurysm or dissection.  Stable hiatal hernia compared to prior exam.  Cholelithiasis.  Stable mild bilateral fat containing inguinal hernias.   Electronically Signed   By: Roque LiasJames  Green M.D.   On: 03/07/2014 21:17   Assessment/Plan  1 chest pain-symptoms are somewhat atypical. He has ruled out for myocardial infarction with serial enzymes. His electrocardiogram shows no ST changes.He is extremely concerned about his symptoms. They have been recurrent since 2009.  Plan to proceed with cardiac catheterization today for definitive evaluation of coronary anatomy. The risks and benefits were discussed and he agrees to proceed.  2 coronary artery disease-continue aspirin and Plavix and statin.  3 question TIA-evaluation in progress by neurology.  4 hypertension-continue ACE  inhibitor.   Quintella ReichertURNER,TRACI R, MD  03/09/2014  9:17 AM

## 2014-03-09 NOTE — CV Procedure (Signed)
   Cardiac Catheterization Procedure Note  Name: Justin ChimesGeorge F Kimbler MRN: [id number] DOB: 30-May-1951  Procedure: Left Heart Cath, Selective Coronary Angiography, LV angiography  Indication: Chest pain with known history of coronary artery disease and previous stenting of the left circumflex.  Medications:  Sedation:  2 mg IV Versed, 50 mcg IV Fentanyl  Contrast:  55 Omnipaque   Procedural Details: The right wrist was prepped, draped, and anesthetized with 1% lidocaine. Using the modified Seldinger technique, a 5 French sheath was introduced into the right radial artery. 3 mg of verapamil was administered through the sheath, weight-based unfractionated heparin was administered intravenously. A Jackie catheter was used for selective coronary angiography. A pigtail catheter was used for left ventriculography. Catheter exchanges were performed over an exchange length guidewire. There were no immediate procedural complications. A TR band was used for radial hemostasis at the completion of the procedure.  The patient was transferred to the post catheterization recovery area for further monitoring.  Procedural Findings:  Hemodynamics: AO:  127/72   mmHg LV:  124/11    mmHg LVEDP: 15  mmHg  Coronary angiography:  Coronary dominance: Codominant    Left Main: Normal   Left Anterior Descending (LAD):   is normal in size with minor irregularities throughout its course.  1st diagonal (D1):   large in size with diffuse 20% proximal disease  2nd diagonal (D2):   very small in size.  3rd diagonal (D3):   very small in size.  Circumflex (LCx):   large in size and codominant. A stent is noted in the midsegment with no significant restenosis. The stent is at the origin of the posterior AV groove artery.  1st obtuse marginal:   normal in size with minor irregularities.  2nd obtuse marginal:   normal in size with no significant disease.  3rd obtuse marginal:   small in size with no significant  disease.   AV groove continuation segment:  normal in size with 20% ostial stenosis. This bifurcates into 2 small posterolateral branches  Right Coronary Artery:  small in size and codominant. The vessel has no significant disease.  Posterior descending artery:  small in size with no significant disease.  Left ventriculography: Left ventricular systolic function is  normal , LVEF is estimated at  at 60% %, there is  no significant mitral regurgitation   Final Conclusions:    1. Widely patent stent in the mid left circumflex with no evidence of obstructive disease. 2. Normal LV systolic function with mildly elevated left ventricular end-diastolic pressure.  Recommendations:   continue medical therapy.  Lorine BearsMuhammad Vannie Hilgert MD, [facility] CenterFACC 03/09/2014, 10:46 AM

## 2014-03-09 NOTE — Progress Notes (Signed)
[facility] PROGRESS NOTE  Justin ChimesGeorge F Zuelke RUE:[id number] DOB: [birth year] DOA: 03/07/2014 PCP: [name],[name], MD  Brief Narrative:   Justin Wells is an 63 y.o. male with a PMH of  HTN, hyperlipidemia, CAD with PCI/Stents 2009, MI, PAD s/p femoral stenting, TIA's, ischemic stroke in the setting of acute MI 2014, recent RUE weakness in 4/15, given tPA, anxiety, brought to [facility] ED by ambulance as a code stroke due to acute onset of right arm weakness-numbness, chest pain, tremors Per his wife, [name] was at home sitting on the couch when suddenly started trembling, became sweaty, clammy, and then his right side was weak and numb and EMS was called.  He developed chest pain and shortness of breath on route. ruled out for Acute CVA, cardiac workup underway.  Assessment/Plan: 1. R arm weakness/numbness likely TIA -improved back to baseline -Presented with similar symptoms 4/15, received Tpa, MRI negative then, and also h/o prior CVA -MRI negative -COntinue ASA/plavix -NEuro following -await PT/OT   2. Chest pain/pressure -EKG and cardiac enzymes negative -H/o CAD and Stents to CFx/OM in 2009, since then reports having MI @ [facility] in setting of Nissen Fundoplication Sx -still with some CP >> for cath today, per cards-follow  3. Anxiety disorder -continue paxil -will start on clonazepam 0.25BID as per neuro recsa  4. HTN -stable  5. Chronic pain -percocet changed to Q4prn as per pt's request  DVT proph: lovenox  Code Status: Full code Family Communication: none at bedside Disposition Plan: Home pending workup   Consultants:  Neuro  HPI/Subjective: States still some chest pain that goes into L. Shoulder.no weakness reported  Objective: Filed Vitals:   03/09/14 0611  BP: 122/79  Pulse: 62  Temp: 97.9 F (36.6 C)  Resp: 18    Intake/Output Summary (Last 24 hours) at 03/09/14 1005 Last data filed at 03/08/14 1300  Gross per 24 hour  Intake    240 ml  Output       0 ml  Net    240 ml   Filed Weights   03/07/14 2005 03/08/14 1903  Weight: 95.709 kg (211 lb) 94 kg (207 lb 3.7 oz)    Exam:   General:  AAOx3, IN NAD  Cardiovascular: S1S2/RRR  Respiratory: CTAB  Abdomen: soft, Nt, BS present  Musculoskeletal: no edema c/c  NEuro: motor 5/5 B/L, sensory light touch intact DTR 2plus, plantars upgoing  Data Reviewed: Basic Metabolic Panel:  Recent Labs Lab 03/07/14 2016 03/07/14 2025  NA 139 137  K 4.4 4.1  CL 100 103  CO2 25  --   GLUCOSE 94 95  BUN 14 13  CREATININE 1.08 1.20  CALCIUM 9.0  --    Liver Function Tests:  Recent Labs Lab 03/07/14 2016  AST 24  ALT 20  ALKPHOS 86  BILITOT 0.3  PROT 6.9  ALBUMIN 3.9   No results found for this basename: LIPASE, AMYLASE,  in the last 168 hours No results found for this basename: AMMONIA,  in the last 168 hours CBC:  Recent Labs Lab 03/07/14 2016 03/07/14 2025  WBC 9.5  --   NEUTROABS 6.2  --   HGB 14.1 14.6  HCT 40.8 43.0  MCV 85.9  --   PLT 205  --    Cardiac Enzymes:  Recent Labs Lab 03/07/14 2200 03/08/14 0440 03/08/14 0958  TROPONINI <0.30 <0.30 <0.30   BNP (last 3 results) No results found for this basename: PROBNP,  in the last 8760 hours  CBG:  Recent Labs Lab 03/08/14 1350  GLUCAP 123*    No results found for this or any previous visit (from the past 240 hour(s)).   Studies: Dg Chest 1 View  03/07/2014   CLINICAL DATA:  Chest pain.  Shortness of breath.  Previous stroke.  EXAM: CHEST - 1 VIEW  COMPARISON:  12/27/2013  FINDINGS: The heart size and mediastinal contours are within normal limits. Both lungs are clear. The visualized skeletal structures are unremarkable.  IMPRESSION: No active disease.   Electronically Signed   By: Myles Rosenthal M.D.   On: 03/07/2014 23:17   Ct Head Wo Contrast  03/07/2014   CLINICAL DATA:  Right arm and leg drift.  EXAM: CT HEAD WITHOUT CONTRAST  TECHNIQUE: Contiguous axial images were obtained from the base of  the skull through the vertex without contrast.  COMPARISON:  MRI of the brain 01/14/2014.  CT head 01/14/2014.  FINDINGS: Normal appearance of the intracranial structures. No evidence for acute hemorrhage, mass lesion, midline shift, hydrocephalus or large infarct. No acute bony abnormality. The visualized sinuses are clear. Previous sinus disease has cleared.  IMPRESSION: No acute intracranial abnormality.  Stable appearance from priors.   Electronically Signed   By: Davonna Belling M.D.   On: 03/07/2014 21:05   Mr Brain Wo Contrast  03/07/2014   CLINICAL DATA:  Right-sided visual loss with blurry vision.  EXAM: MRI HEAD WITHOUT CONTRAST  TECHNIQUE: Multiplanar, multiecho pulse sequences of the brain and surrounding structures were obtained without intravenous contrast.  COMPARISON:  Prior CT from earlier the same day as well as prior MRI from 01/14/2014  FINDINGS: Mild diffuse prominence of the CSF containing spaces is compatible with age-appropriate atrophy. Scattered T2/FLAIR subcentimeter hyperintensities within the periventricular and deep white matter both cerebral hemispheres are most consistent with mild chronic microvascular ischemic changes.  No mass lesion, midline shift, or extra-axial fluid collection. Ventricles are normal in size without evidence of hydrocephalus.  No diffusion-weighted signal abnormality is identified to suggest acute intracranial infarct. Gray-white matter differentiation is maintained. Normal flow voids are seen within the intracranial vasculature. No intracranial hemorrhage identified.  The cervicomedullary junction is normal. Pituitary gland is within normal limits. Pituitary stalk is midline. The globes and optic nerves demonstrate a normal appearance with normal signal intensity.  The bone marrow signal intensity is normal. Calvarium is intact. Visualized upper cervical spine is within normal limits.  Scalp soft tissues are unremarkable.  Mild T2 hyperintense mucosal thickening  noted within the maxillary and ethmoidal sinuses bilaterally. Minimal mucosal thickening also present within the right frontal sinus. No mastoid effusion.  IMPRESSION: 1. Stable exam with no acute intracranial infarct or other abnormality. 2. Mild chronic microvascular ischemic changes.   Electronically Signed   By: Rise Mu M.D.   On: 03/07/2014 22:44   Ct Angio Chest Aortic Dissect W &/or W/o  03/07/2014   CLINICAL DATA:  Chest pain.  EXAM: CT ANGIOGRAPHY CHEST, ABDOMEN AND PELVIS  TECHNIQUE: Multidetector CT imaging through the chest, abdomen and pelvis was performed using the standard protocol during bolus administration of intravenous contrast. Multiplanar reconstructed images and MIPs were obtained and reviewed to evaluate the vascular anatomy.  CONTRAST:  OMNIPAQUE IOHEXOL 350 MG/ML SOLN  COMPARISON:  CT scan of December 27, 2013.  FINDINGS: CTA CHEST FINDINGS  No pneumothorax or pleural effusion is noted. No acute pulmonary disease is noted. No mediastinal mass or adenopathy is noted mild coronary artery calcifications are noted. Moderate hiatal hernia  is noted. There is no evidence of thoracic aortic dissection or aneurysm. 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 There is no evidence of pulmonary embolus.  Review of the MIP images confirms the above findings.  CTA ABDOMEN AND PELVIS FINDINGS  No focal abnormality is noted in the liver, spleen or pancreas. Cholelithiasis is noted. No evidence of aneurysm or dissection is seen involving the abdominal aorta. Atherosclerotic calcifications are noted in the abdominal aorta and iliac arteries. The mesenteric and renal arteries are widely patent without significant stenosis. Adrenal glands and kidneys appear normal. No hydronephrosis or renal obstruction is noted. No evidence of bowel obstruction is noted. The appendix appears normal. Urinary bladder appears normal. Stable mild bilateral fat containing inguinal  hernias are noted.  Review of the MIP images confirms the above findings.  IMPRESSION: No evidence of thoracic or abdominal aortic aneurysm or dissection.  Stable hiatal hernia compared to prior exam.  Cholelithiasis.  Stable mild bilateral fat containing inguinal hernias.   Electronically Signed   By: Roque LiasJames  Green M.D.   On: 03/07/2014 21:17   Ct Angio Abd/pel W/ And/or W/o  03/07/2014   CLINICAL DATA:  Chest pain.  EXAM: CT ANGIOGRAPHY CHEST, ABDOMEN AND PELVIS  TECHNIQUE: Multidetector CT imaging through the chest, abdomen and pelvis was performed using the standard protocol during bolus administration of intravenous contrast. Multiplanar reconstructed images and MIPs were obtained and reviewed to evaluate the vascular anatomy.  CONTRAST:  100mL OMNIPAQUE IOHEXOL 350 MG/ML SOLN  COMPARISON:  CT scan of December 27, 2013.  FINDINGS: CTA CHEST FINDINGS  No pneumothorax or pleural effusion is noted. No acute pulmonary disease is noted. No mediastinal mass or adenopathy is noted mild coronary artery calcifications are noted. Moderate hiatal hernia is noted. There is no evidence of thoracic aortic dissection or aneurysm. 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 There is no evidence of pulmonary embolus.  Review of the MIP images confirms the above findings.  CTA ABDOMEN AND PELVIS FINDINGS  No focal abnormality is noted in the liver, spleen or pancreas. Cholelithiasis is noted. No evidence of aneurysm or dissection is seen involving the abdominal aorta. Atherosclerotic calcifications are noted in the abdominal aorta and iliac arteries. The mesenteric and renal arteries are widely patent without significant stenosis. Adrenal glands and kidneys appear normal. No hydronephrosis or renal obstruction is noted. No evidence of bowel obstruction is noted. The appendix appears normal. Urinary bladder appears normal. Stable mild bilateral fat containing inguinal hernias are noted.  Review of the MIP  images confirms the above findings.  IMPRESSION: No evidence of thoracic or abdominal aortic aneurysm or dissection.  Stable hiatal hernia compared to prior exam.  Cholelithiasis.  Stable mild bilateral fat containing inguinal hernias.   Electronically Signed   By: Roque LiasJames  Green M.D.   On: 03/07/2014 21:17    Scheduled Meds: . aspirin EC  81 mg Oral Daily  . [START ON 03/10/2014] atorvastatin  80 mg Oral Q0600  . clopidogrel  75 mg Oral Daily  . enoxaparin (LOVENOX) injection  40 mg Subcutaneous Q24H  . famotidine  20 mg Oral BID  . lisinopril  5 mg Oral Daily  . multivitamin with minerals  1 tablet Oral Daily  . omega-3 acid ethyl esters  1 g Oral Daily  . PARoxetine  20 mg Oral Daily  . sodium chloride  3 mL Intravenous Q12H   Continuous Infusions: . sodium chloride 1 mL/kg/hr (03/08/14 2119)  Antibiotics Given (last 72 hours)   None      Active Problems:   CVA (cerebral infarction)   Atherosclerosis of coronary artery   TIA (transient ischemic attack)   Chest pain   PAD (peripheral artery disease)   Anxiety state, unspecified   Chronic pain syndrome    Time spent:    [facility]  [facility] Pager [phone]. If 7PM-7AM, please contact night-coverage at [email or web address], password [facility] 03/09/2014, 10:05 AM  LOS: 2 days

## 2014-03-09 NOTE — Progress Notes (Signed)
OT Cancellation Note  Patient Details Name: Justin Wells MRN: [id number] DOB: 02/28/1951   Cancelled Treatment:    Reason Eval/Treat Not Completed: Patient at procedure or test/ unavailable. Pt transferred for cardiac cath procedure this morning. OT will continue to follow up with pt as appropriate.   Rae LipsMiller, Seth Higginbotham M [phone] 03/09/2014, 2:10 PM

## 2014-03-09 NOTE — H&P (View-Only) (Signed)
SUBJECTIVE:  Had some more CP last night  OBJECTIVE:   Vitals:   Filed Vitals:   03/08/14 1903 03/08/14 2203 03/09/14 0220 03/09/14 0611  BP:  125/79 102/56 122/79  Pulse:  66 63 62  Temp:  98.4 F (36.9 C) 97.3 F (36.3 C) 97.9 F (36.6 C)  TempSrc:  Oral Oral Oral  Resp:  18 18 18   Height:      Weight: 207 lb 3.7 oz (94 kg)     SpO2:  98% 98% 97%   I&O's:   Intake/Output Summary (Last 24 hours) at 03/09/14 [id number] Last data filed at 03/08/14 1300  Gross per 24 hour  Intake    240 ml  Output      0 ml  Net    240 ml   TELEMETRY: Reviewed telemetry pt in NSR:     PHYSICAL EXAM General: Well developed, well nourished, in no acute distress Head: Eyes PERRLA, No xanthomas.   Normal cephalic and atramatic  Lungs:   Clear bilaterally to auscultation and percussion. Heart:   HRRR S1 S2 Pulses are 2+ & equal. Abdomen: Bowel sounds are positive, abdomen soft and non-tender without masses Extremities:   No clubbing, cyanosis or edema.  DP +1 Neuro: Alert and oriented X 3. Psych:  Good affect, responds appropriately   LABS: Basic Metabolic Panel:  Recent Labs  96/12/5405/06/15 2016 03/07/14 2025  NA 139 137  K 4.4 4.1  CL 100 103  CO2 25  --   GLUCOSE 94 95  BUN 14 13  CREATININE 1.08 1.20  CALCIUM 9.0  --    Liver Function Tests:  Recent Labs  03/07/14 2016  AST 24  ALT 20  ALKPHOS 86  BILITOT 0.3  PROT 6.9  ALBUMIN 3.9   No results found for this basename: LIPASE, AMYLASE,  in the last 72 hours CBC:  Recent Labs  03/07/14 2016 03/07/14 2025  WBC 9.5  --   NEUTROABS 6.2  --   HGB 14.1 14.6  HCT 40.8 43.0  MCV 85.9  --   PLT 205  --    Cardiac Enzymes:  Recent Labs  03/07/14 2200 03/08/14 0440 03/08/14 0958  TROPONINI <0.30 <0.30 <0.30   BNP: No components found with this basename: POCBNP,  D-Dimer: No results found for this basename: DDIMER,  in the last 72 hours Hemoglobin A1C:  Recent Labs  03/08/14 0440  HGBA1C 5.6   Fasting  Lipid Panel:  Recent Labs  03/08/14 0440  CHOL 151  HDL 40  LDLCALC 79  TRIG 160*  CHOLHDL 3.8   Thyroid Function Tests: No results found for this basename: TSH, T4TOTAL, FREET3, T3FREE, THYROIDAB,  in the last 72 hours Anemia Panel: No results found for this basename: VITAMINB12, FOLATE, FERRITIN, TIBC, IRON, RETICCTPCT,  in the last 72 hours Coag Panel:   Lab Results  Component Value Date   INR 1.03 03/07/2014   INR 1.01 01/14/2014   INR 0.97 12/27/2013    RADIOLOGY: Dg Chest 1 View  03/07/2014   CLINICAL DATA:  Chest pain.  Shortness of breath.  Previous stroke.  EXAM: CHEST - 1 VIEW  COMPARISON:  12/27/2013  FINDINGS: The heart size and mediastinal contours are within normal limits. Both lungs are clear. The visualized skeletal structures are unremarkable.  IMPRESSION: No active disease.   Electronically Signed   By: Myles RosenthalJohn  Stahl M.D.   On: 03/07/2014 23:17   Ct Head Wo Contrast  03/07/2014   CLINICAL DATA:  Right arm and leg drift.  EXAM: CT HEAD WITHOUT CONTRAST  TECHNIQUE: Contiguous axial images were obtained from the base of the skull through the vertex without contrast.  COMPARISON:  MRI of the brain 01/14/2014.  CT head 01/14/2014.  FINDINGS: Normal appearance of the intracranial structures. No evidence for acute hemorrhage, mass lesion, midline shift, hydrocephalus or large infarct. No acute bony abnormality. The visualized sinuses are clear. Previous sinus disease has cleared.  IMPRESSION: No acute intracranial abnormality.  Stable appearance from priors.   Electronically Signed   By: Davonna Belling M.D.   On: 03/07/2014 21:05   Mr Brain Wo Contrast  03/07/2014   CLINICAL DATA:  Right-sided visual loss with blurry vision.  EXAM: MRI HEAD WITHOUT CONTRAST  TECHNIQUE: Multiplanar, multiecho pulse sequences of the brain and surrounding structures were obtained without intravenous contrast.  COMPARISON:  Prior CT from earlier the same day as well as prior MRI from 01/14/2014  FINDINGS:  Mild diffuse prominence of the CSF containing spaces is compatible with age-appropriate atrophy. Scattered T2/FLAIR subcentimeter hyperintensities within the periventricular and deep white matter both cerebral hemispheres are most consistent with mild chronic microvascular ischemic changes.  No mass lesion, midline shift, or extra-axial fluid collection. Ventricles are normal in size without evidence of hydrocephalus.  No diffusion-weighted signal abnormality is identified to suggest acute intracranial infarct. Gray-white matter differentiation is maintained. Normal flow voids are seen within the intracranial vasculature. No intracranial hemorrhage identified.  The cervicomedullary junction is normal. Pituitary gland is within normal limits. Pituitary stalk is midline. The globes and optic nerves demonstrate a normal appearance with normal signal intensity.  The bone marrow signal intensity is normal. Calvarium is intact. Visualized upper cervical spine is within normal limits.  Scalp soft tissues are unremarkable.  Mild T2 hyperintense mucosal thickening noted within the maxillary and ethmoidal sinuses bilaterally. Minimal mucosal thickening also present within the right frontal sinus. No mastoid effusion.  IMPRESSION: 1. Stable exam with no acute intracranial infarct or other abnormality. 2. Mild chronic microvascular ischemic changes.   Electronically Signed   By: Rise Mu M.D.   On: 03/07/2014 22:44   Ct Angio Chest Aortic Dissect W &/or W/o  03/07/2014   CLINICAL DATA:  Chest pain.  EXAM: CT ANGIOGRAPHY CHEST, ABDOMEN AND PELVIS  TECHNIQUE: Multidetector CT imaging through the chest, abdomen and pelvis was performed using the standard protocol during bolus administration of intravenous contrast. Multiplanar reconstructed images and MIPs were obtained and reviewed to evaluate the vascular anatomy.  CONTRAST:  OMNIPAQUE IOHEXOL 350 MG/ML SOLN  COMPARISON:  CT scan of December 27, 2013.  FINDINGS:  CTA CHEST FINDINGS  No pneumothorax or pleural effusion is noted. No acute pulmonary disease is noted. No mediastinal mass or adenopathy is noted mild coronary artery calcifications are noted. Moderate hiatal hernia is noted. There is no evidence of thoracic aortic dissection or aneurysm. 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 There is no evidence of pulmonary embolus.  Review of the MIP images confirms the above findings.  CTA ABDOMEN AND PELVIS FINDINGS  No focal abnormality is noted in the liver, spleen or pancreas. Cholelithiasis is noted. No evidence of aneurysm or dissection is seen involving the abdominal aorta. Atherosclerotic calcifications are noted in the abdominal aorta and iliac arteries. The mesenteric and renal arteries are widely patent without significant stenosis. Adrenal glands and kidneys appear normal. No hydronephrosis or renal obstruction is noted. No evidence of bowel obstruction is noted. The appendix  appears normal. Urinary bladder appears normal. Stable mild bilateral fat containing inguinal hernias are noted.  Review of the MIP images confirms the above findings.  IMPRESSION: No evidence of thoracic or abdominal aortic aneurysm or dissection.  Stable hiatal hernia compared to prior exam.  Cholelithiasis.  Stable mild bilateral fat containing inguinal hernias.   Electronically Signed   By: Roque LiasJames  Green M.D.   On: 03/07/2014 21:17   Ct Angio Abd/pel W/ And/or W/o  03/07/2014   CLINICAL DATA:  Chest pain.  EXAM: CT ANGIOGRAPHY CHEST, ABDOMEN AND PELVIS  TECHNIQUE: Multidetector CT imaging through the chest, abdomen and pelvis was performed using the standard protocol during bolus administration of intravenous contrast. Multiplanar reconstructed images and MIPs were obtained and reviewed to evaluate the vascular anatomy.  CONTRAST:  100mL OMNIPAQUE IOHEXOL 350 MG/ML SOLN  COMPARISON:  CT scan of December 27, 2013.  FINDINGS: CTA CHEST FINDINGS  No pneumothorax  or pleural effusion is noted. No acute pulmonary disease is noted. No mediastinal mass or adenopathy is noted mild coronary artery calcifications are noted. Moderate hiatal hernia is noted. There is no evidence of thoracic aortic dissection or aneurysm. 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 There is no evidence of pulmonary embolus.  Review of the MIP images confirms the above findings.  CTA ABDOMEN AND PELVIS FINDINGS  No focal abnormality is noted in the liver, spleen or pancreas. Cholelithiasis is noted. No evidence of aneurysm or dissection is seen involving the abdominal aorta. Atherosclerotic calcifications are noted in the abdominal aorta and iliac arteries. The mesenteric and renal arteries are widely patent without significant stenosis. Adrenal glands and kidneys appear normal. No hydronephrosis or renal obstruction is noted. No evidence of bowel obstruction is noted. The appendix appears normal. Urinary bladder appears normal. Stable mild bilateral fat containing inguinal hernias are noted.  Review of the MIP images confirms the above findings.  IMPRESSION: No evidence of thoracic or abdominal aortic aneurysm or dissection.  Stable hiatal hernia compared to prior exam.  Cholelithiasis.  Stable mild bilateral fat containing inguinal hernias.   Electronically Signed   By: Roque LiasJames  Green M.D.   On: 03/07/2014 21:17   Assessment/Plan  1 chest pain-symptoms are somewhat atypical. He has ruled out for myocardial infarction with serial enzymes. His electrocardiogram shows no ST changes.He is extremely concerned about his symptoms. They have been recurrent since 2009.  Plan to proceed with cardiac catheterization today for definitive evaluation of coronary anatomy. The risks and benefits were discussed and he agrees to proceed.  2 coronary artery disease-continue aspirin and Plavix and statin.  3 question TIA-evaluation in progress by neurology.  4 hypertension-continue ACE  inhibitor.   Quintella ReichertURNER,TRACI R, MD  03/09/2014  9:17 AM

## 2014-03-09 NOTE — Telephone Encounter (Signed)
Left message for patient regarding rescheduling 04/06/14 appointment per DR. [name]'s schedule, printed and mailed letter with new appointment time.

## 2014-03-09 NOTE — Interval H&P Note (Signed)
Cath Lab Visit (complete for each Cath Lab visit)  Clinical Evaluation Leading to the Procedure:   ACS: Yes.    Non-ACS:    Anginal Classification: CCS III  Anti-ischemic medical therapy: Minimal Therapy (1 class of medications)  Non-Invasive Test Results: No non-invasive testing performed  Prior CABG: No previous CABG      History and Physical Interval Note:  03/09/2014 10:09 AM  Justin Wells  has presented today for surgery, with the diagnosis of cp  The various methods of treatment have been discussed with the patient and family. After consideration of risks, benefits and other options for treatment, the patient has consented to  Procedure(s): LEFT HEART CATHETERIZATION WITH CORONARY ANGIOGRAM (N/A) as a surgical intervention .  The patient's history has been reviewed, patient examined, no change in status, stable for surgery.  I have reviewed the patient's chart and labs.  Questions were answered to the patient's satisfaction.     Lorine BearsMuhammad Kamarah Bilotta

## 2014-03-09 NOTE — Progress Notes (Addendum)
Stroke Team Progress Note  HISTORY Chief Complaint: CODE STROKE: right arm weakness-numbness, chest pain, tremors  HPI:  Justin Wells is an 63 y.o. male with a past medical history significant for HTN, hyperlipidemia, CAD s/p stent deployment x 3, MI, PAD s/p femoral stenting, TIA's, ischemic stroke in the setting of acute MI 2014, anxiety, brought to [facility] ED by ambulance as a code stroke due to acute onset of the above stated symptoms.  Wife said that Mr. Justin JohnWarren was home sitting in a couch when suddenly started trembling, became sweaty, clammy, and then his right side was weak and numb and this EMS was summoned.  He developed chest pain and shortness of breath on route.  NIHSS 2 on initial assessment.  CT brain showed no acute abnormality.  CTA chest negative.  He is on plavix.  Patient reports a lot of anxiety but denies HA, vertigo, double vision, difficulty swallowing, slurred speech, or language impairment. York SpanielSaid that he can not see on the right eye and still c/o chest pain  Date last known well: 03/07/14  Time last known well: 1900  tPA Given: no, mild deficits, recent IV tpa/? Stroke 4/15  NIHSS: 2   Patient was not administered TPA secondary to mild deficits. He was admitted to the Internal Medicine Service for further evaluation and treatment. Neurology is consulting.  SUBJECTIVE No acute events overnight. The patient was alert and conversant, and complains of pain in his neck if big movement of neck. He was seen after cardiac cath this evening. States that he feels good today and no complains of any dizziness, weakness or numbness. Still has very limited range of motion at neck and painful on movement. Had extensive stroke work up in April and May, had CTA head and neck, as well as MRI head and neck, but no revealing. Followed up in clinic with Dr. Pearlean BrownieSethi, consider complicated migraine but pt stated that the topamax does not work. Cardiac cath today, was told no significant vessel  occlusion. He was in good mood.   OBJECTIVE Most recent Vital Signs: Filed Vitals:   03/08/14 1903 03/08/14 2203 03/09/14 0220 03/09/14 0611  BP:  125/79 102/56 122/79  Pulse:  66 63 62  Temp:  98.4 F (36.9 C) 97.3 F (36.3 C) 97.9 F (36.6 C)  TempSrc:  Oral Oral Oral  Resp:  18 18 18   Height:      Weight: 94 kg (207 lb 3.7 oz)     SpO2:  98% 98% 97%   CBG (last 3)   Recent Labs  03/08/14 1350  GLUCAP 123*    IV Fluid Intake:   . sodium chloride 1 mL/kg/hr (03/08/14 2119)    MEDICATIONS  . aspirin EC  81 mg Oral Daily  . [START ON 03/10/2014] atorvastatin  80 mg Oral Q0600  . clopidogrel  75 mg Oral Daily  . enoxaparin (LOVENOX) injection  40 mg Subcutaneous Q24H  . famotidine  20 mg Oral BID  . lisinopril  5 mg Oral Daily  . multivitamin with minerals  1 tablet Oral Daily  . omega-3 acid ethyl esters  1 g Oral Daily  . PARoxetine  20 mg Oral Daily  . sodium chloride  3 mL Intravenous Q12H   PRN:  sodium chloride, cyclobenzaprine, nitroGLYCERIN, oxyCODONE-acetaminophen, senna-docusate, sodium chloride  Diet:  NPO thin liquids Activity: As tolerated DVT Prophylaxis:  Lovenox  CLINICALLY SIGNIFICANT STUDIES Basic Metabolic Panel:   Recent Labs Lab 03/07/14 2016 03/07/14 2025  NA 139  137  K 4.4 4.1  CL 100 103  CO2 25  --   GLUCOSE 94 95  BUN 14 13  CREATININE 1.08 1.20  CALCIUM 9.0  --    Liver Function Tests:   Recent Labs Lab 03/07/14 2016  AST 24  ALT 20  ALKPHOS 86  BILITOT 0.3  PROT 6.9  ALBUMIN 3.9   CBC:   Recent Labs Lab 03/07/14 2016 03/07/14 2025  WBC 9.5  --   NEUTROABS 6.2  --   HGB 14.1 14.6  HCT 40.8 43.0  MCV 85.9  --   PLT 205  --    Coagulation:   Recent Labs Lab 03/07/14 2016  LABPROT 13.5  INR 1.03   Cardiac Enzymes:   Recent Labs Lab 03/07/14 2200 03/08/14 0440 03/08/14 0958  TROPONINI <0.30 <0.30 <0.30   Urinalysis:   Recent Labs Lab 03/07/14 2107  COLORURINE YELLOW  LABSPEC 1.028   PHURINE 5.5  GLUCOSEU NEGATIVE  HGBUR NEGATIVE  BILIRUBINUR NEGATIVE  KETONESUR NEGATIVE  PROTEINUR NEGATIVE  UROBILINOGEN 1.0  NITRITE NEGATIVE  LEUKOCYTESUR NEGATIVE   Lipid Panel    Component Value Date/Time   CHOL 151 03/08/2014 0440   TRIG 160* 03/08/2014 0440   HDL 40 03/08/2014 0440   CHOLHDL 3.8 03/08/2014 0440   VLDL 32 03/08/2014 0440   LDLCALC 79 03/08/2014 0440   HgbA1C  Lab Results  Component Value Date   HGBA1C 5.6 03/08/2014    Urine Drug Screen:     Component Value Date/Time   LABOPIA POSITIVE* 03/07/2014 2107   COCAINSCRNUR NONE DETECTED 03/07/2014 2107   LABBENZ NONE DETECTED 03/07/2014 2107   AMPHETMU NONE DETECTED 03/07/2014 2107   THCU NONE DETECTED 03/07/2014 2107   LABBARB NONE DETECTED 03/07/2014 2107    Alcohol Level:   Recent Labs Lab 03/07/14 2016  ETH <11    Dg Chest 1 View  03/07/2014 IMPRESSION: No active disease.     CT Head Wo Contrast  03/07/2014 IMPRESSION: No acute intracranial abnormality.  Stable appearance from priors.     CT Angio Neck  12/28/13 CTA NECK :  Aberrant origin of the right subclavian artery.  Right vertebral artery and right common carotid artery with common  origin.  Left vertebral artery arises directly from the aortic arch with mild  narrowing proximal aspect.  Plaque carotid bifurcation bilaterally with less than 50% diameter  stenosis bilaterally.  Scattered normal to top-normal size lymph nodes throughout the neck  of questionable significance/etiology.  Circumferential thickening of the cervical/ upper thoracic esophagus  may be related to under distension although evaluation is limited.  MRI/MRA Brain  03/07/2014  IMPRESSION: 1. Stable exam with no acute intracranial infarct or other abnormality. 2. Mild chronic microvascular ischemic changes.     MRI Head/C Spine  01/14/14 IMPRESSION:  No acute infarct. Mild chronic microvascular ischemia.  Chronic sinusitis.  Cervical spondylosis most prominent at C4-5 with mild  spinal stenosis. No acute disc protrusion.   Ct Angio Chest Aortic Dissect W &/or W/o  03/07/2014 IMPRESSION: No evidence of thoracic or abdominal aortic aneurysm or dissection.  Stable hiatal hernia compared to prior exam.  Cholelithiasis.  Stable mild bilateral fat containing inguinal hernias.    Ct Angio Abd/pel W/ And/or W/o  03/07/2014 IMPRESSION: No evidence of thoracic or abdominal aortic aneurysm or dissection.  Stable hiatal hernia compared to prior exam.  Cholelithiasis.  Stable mild bilateral fat containing inguinal hernias.     2D Echocardiogram - Left ventricle: The cavity  size was normal. Wall thickness was increased in a pattern of mild LVH. Systolic function was normal. The estimated ejection fraction was in the range of 60% to 65%. Wall motion was normal; there were no regional wall motion abnormalities. - Right atrium: The atrium was mildly dilated.  EKG  03/07/14 Sinus rhythm RSR' in V1 or V2, right VCD or RVH For complete results please see formal report.   Therapy Recommendations Speech recs: no need for ongoing therapy  Physical Exam Blood pressure 122/79, pulse 62, temperature 97.9 F (36.6 C), temperature source Oral, resp. rate 18, height 5\' 10"  (1.778 m), weight 94 kg (207 lb 3.7 oz), SpO2 97.00%. Gen: Patient is well developed, well nourished man in no acute distress.  Cardiac: RRR. S1S2 audible.   Extremities: Cap refill <2 secs. No cyanosis or edema. Pulses 2+ radial and DP. Pulmonary: Respirations regular, symmetric. Lungs clear to auscultation bilat. Abd: Soft, non-tender. BS audible x 4 quadrants.  G/U: Deferred  MS: Alert, follows commands. Oriented to person, place, time, and event. Speech: Speech fluent and non-dysarthric. Able to name and repeat.   CN: No visual field cut. PERRL. EOMs intact. Facial sensation decreased on the left but split at midline. No facial droop. Hearing symmetrical. Strong cough. Sternocleidomastoids 5/5 strength. Tongue  midline, full strength, no atrophy or fasciculations.  Strength: 5/5 in BUE and BLE proximally and distally.  Sensation: Intact to light touch and temp in face, arms,and legs.  Coordination/Proprioception: No ataxia or dysmetria on FTN or HTS bilat.   Did not test gait or Romberg.   Reflexes: 1+ biceps, brachioradialis bilat. 2+ patellar, 2+ achilles bilat. Downgoing toes bilat.  NIHSS 0  ASSESSMENT/PLAN Mr. MOKSH LOOMER is a 63 y.o. male with hx of stroke risk factors including HTN, hyperlipidemia, CAD s/p stent deployment x 3, MI, PAD s/p femoral stenting, TIA's, and prior ischemic stroke in the setting of acute MI in 2014. He presented with right arm weakness-numbness, chest pain, tremors. Patient was not given tPA due to mild symptoms.  Imaging confirms no acute infarct but mild chronic microvascular ischemic changes. He has multiple MRIs in the past but no acute stroke and even did not have evidence of previous stroke. On aspirin 81 mg orally every day and clopidogrel 75 mg orally every day prior to admission. Now on aspirin 81 mg orally every day and clopidogrel 75 mg orally every day for secondary stroke/cardiac prevention. Stroke up so far not revealing. CTA head neck not flowing limiting. MRI brain and C-spine unremarkable, even did not see evidence of prior stroke. He has a lot of stress which may explain his functional component. On paxil for depression. I feel his episodes are related to panic attack at the setting of triggers such as neck pain. Will consider to start clonazepam 0.25mg  bid for panic attack. And will consider soft C-collar for neck pain. If pt has symptoms of occipital neuralgia, will do occipital nerve block.    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  LDL 79, WNL  HgbA1c 5.4, WNL  2D Echo and cardiac cath unremarkable  Lovenox for DVT prophylaxis  Consider soft C-collar for his limited range  of neck movement  Recommend clonazepam 0.25mg  bid.   If pt has symptoms of occipital neuralgia, will do occipital nerve block.   Hospital day # 2  SIGNED Stephani Police, MSN, ANP-C, CNRN, MSCS [facility] Stroke Team [phone]  I, the attending vascular neurologist, have personally obtained a history, examined the patient, evaluated laboratory data and imaging studies, and formulated the assessment and plan of care.  I have made any additions or clarifications directly to the above note and agree with the findings and plan as currently documented.  Marvel Plan, MD PhD 03/09/2014 10:13 PM    To contact Stroke Continuity provider, please refer to [email or web address]. After hours, contact General Neurology

## 2014-03-10 DIAGNOSIS — R29898 Other symptoms and signs involving the musculoskeletal system: Secondary | ICD-10-CM | POA: Diagnosis not present

## 2014-03-10 DIAGNOSIS — G459 Transient cerebral ischemic attack, unspecified: Secondary | ICD-10-CM | POA: Diagnosis not present

## 2014-03-10 DIAGNOSIS — K219 Gastro-esophageal reflux disease without esophagitis: Secondary | ICD-10-CM

## 2014-03-10 LAB — BASIC METABOLIC PANEL
ANION GAP: 15 (ref 5–15)
BUN: 7 mg/dL (ref 6–23)
CALCIUM: 8.8 mg/dL (ref 8.4–10.5)
CO2: 24 mEq/L (ref 19–32)
Chloride: 98 mEq/L (ref 96–112)
Creatinine, Ser: 0.87 mg/dL (ref 0.50–1.35)
GFR calc Af Amer: >90 mL/min (ref >90)
Glucose, Bld: 138 mg/dL — ABNORMAL HIGH (ref 70–99)
POTASSIUM: 4.7 meq/L (ref 3.7–5.3)
SODIUM: 137 meq/L (ref 137–147)

## 2014-03-10 MED ORDER — ATORVASTATIN CALCIUM 80 MG PO TABS: 80.0000 mg | ORAL_TABLET | Freq: Every day | ORAL | Status: DC

## 2014-03-10 MED ORDER — CLONAZEPAM 0.5 MG PO TABS: 0.2500 mg | ORAL_TABLET | Freq: Two times a day (BID) | ORAL | Status: DC

## 2014-03-10 NOTE — Progress Notes (Signed)
Stroke Team Progress Note   SUBJECTIVE Patient sitting in bed., has soft collar on. Complains of right foot/leg numbness, requiring him to have to move it frequently to remember it is there.  OBJECTIVE Most recent Vital Signs: Filed Vitals:   03/09/14 1938 03/10/14 0001 03/10/14 0527 03/10/14 0708  BP: 133/65 107/68 115/74 117/69  Pulse: 78 64 63 67  Temp: 97.8 F (36.6 C) 97.5 F (36.4 C) 97.4 F (36.3 C) 97.5 F (36.4 C)  TempSrc: Oral Oral Oral Oral  Resp: 18 18 20 20   Height:      Weight:  92.8 kg (204 lb 9.4 oz)    SpO2: 97% 97% 96% 96%   CBG (last 3)   Recent Labs  03/08/14 1350  GLUCAP 123*    IV Fluid Intake:      MEDICATIONS  . aspirin EC  81 mg Oral Daily  . atorvastatin  80 mg Oral Q0600  . clonazePAM  0.25 mg Oral BID  . clopidogrel  75 mg Oral Daily  . enoxaparin (LOVENOX) injection  40 mg Subcutaneous Q24H  . famotidine  20 mg Oral BID  . lisinopril  5 mg Oral Daily  . multivitamin with minerals  1 tablet Oral Daily  . omega-3 acid ethyl esters  1 g Oral Daily  . PARoxetine  20 mg Oral Daily   PRN:  cyclobenzaprine, nitroGLYCERIN, oxyCODONE-acetaminophen, senna-docusate  Diet:  Cardiac thin liquids Activity: As tolerated DVT Prophylaxis:  Lovenox  CLINICALLY SIGNIFICANT STUDIES Basic Metabolic Panel:   Recent Labs Lab 03/07/14 2016 03/07/14 2025 03/10/14 0820  NA 139 137 137  K 4.4 4.1 4.7  CL 100 103 98  CO2 25  --  24  GLUCOSE 94 95 138*  BUN 14 13 7   CREATININE 1.08 1.20 0.87  CALCIUM 9.0  --  8.8   Liver Function Tests:   Recent Labs Lab 03/07/14 2016  AST 24  ALT 20  ALKPHOS 86  BILITOT 0.3  PROT 6.9  ALBUMIN 3.9   CBC:   Recent Labs Lab 03/07/14 2016 03/07/14 2025  WBC 9.5  --   NEUTROABS 6.2  --   HGB 14.1 14.6  HCT 40.8 43.0  MCV 85.9  --   PLT 205  --    Coagulation:   Recent Labs Lab 03/07/14 2016  LABPROT 13.5  INR 1.03   Cardiac Enzymes:   Recent Labs Lab 03/07/14 2200 03/08/14 0440  03/08/14 0958  TROPONINI <0.30 <0.30 <0.30   Urinalysis:   Recent Labs Lab 03/07/14 2107  COLORURINE YELLOW  LABSPEC 1.028  PHURINE 5.5  GLUCOSEU NEGATIVE  HGBUR NEGATIVE  BILIRUBINUR NEGATIVE  KETONESUR NEGATIVE  PROTEINUR NEGATIVE  UROBILINOGEN 1.0  NITRITE NEGATIVE  LEUKOCYTESUR NEGATIVE   Lipid Panel    Component Value Date/Time   CHOL 151 03/08/2014 0440   TRIG 160* 03/08/2014 0440   HDL 40 03/08/2014 0440   CHOLHDL 3.8 03/08/2014 0440   VLDL 32 03/08/2014 0440   LDLCALC 79 03/08/2014 0440   HgbA1C  Lab Results  Component Value Date   HGBA1C 5.6 03/08/2014    Urine Drug Screen:     Component Value Date/Time   LABOPIA POSITIVE* 03/07/2014 2107   COCAINSCRNUR NONE DETECTED 03/07/2014 2107   LABBENZ NONE DETECTED 03/07/2014 2107   AMPHETMU NONE DETECTED 03/07/2014 2107   THCU NONE DETECTED 03/07/2014 2107   LABBARB NONE DETECTED 03/07/2014 2107    Alcohol Level:   Recent Labs Lab 03/07/14 2016  ETH <11  Dg Chest 1 View  03/07/2014 IMPRESSION: No active disease.     CT Head Wo Contrast  03/07/2014 IMPRESSION: No acute intracranial abnormality.  Stable appearance from priors.     CT Angio Neck  12/28/13 CTA NECK :  Aberrant origin of the right subclavian artery.  Right vertebral artery and right common carotid artery with common origin. Left vertebral artery arises directly from the aortic arch with mild narrowing proximal aspect.  Plaque carotid bifurcation bilaterally with less than 50% diameter stenosis bilaterally. Scattered normal to top-normal size lymph nodes throughout the neck of questionable significance/etiology. Circumferential thickening of the cervical/ upper thoracic esophagus may be related to under distension although evaluation is limited.  MRI/MRA Brain  03/07/2014  IMPRESSION: 1. Stable exam with no acute intracranial infarct or other abnormality. 2. Mild chronic microvascular ischemic changes.     MRI Head/C Spine  01/14/14 IMPRESSION:  No acute  infarct. Mild chronic microvascular ischemia.  Chronic sinusitis. 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 No acute disc protrusion.   Ct Angio Chest Aortic Dissect W &/or W/o  03/07/2014 IMPRESSION: No evidence of thoracic or abdominal aortic aneurysm or dissection.  Stable hiatal hernia compared to prior exam.  Cholelithiasis.  Stable mild bilateral fat containing inguinal hernias.    Ct Angio Abd/pel W/ And/or W/o  03/07/2014 IMPRESSION: No evidence of thoracic or abdominal aortic aneurysm or dissection.  Stable hiatal hernia compared to prior exam.  Cholelithiasis.  Stable mild bilateral fat containing inguinal hernias.     2D Echocardiogram - Left ventricle: The cavity size was normal. Wall thickness was increased in a pattern of mild LVH. Systolic function was normal. The estimated ejection fraction was in the range of 60% to 65%. Wall motion was normal; there were no regional wall motion abnormalities.  Right atrium: The atrium was mildly dilated.  EKG  03/07/14 Sinus rhythm RSR' in V1 or V2, right VCD or RVH For complete results please see formal report.   Therapy Recommendations  ST no need for ongoing therapy, PT recs OP PT  Physical Exam Blood pressure 117/69, pulse 67, temperature 97.5 F (36.4 C), temperature source Oral, resp. rate 20, height 5\' 10"  (1.778 m), weight 92.8 kg (204 lb 9.4 oz), SpO2 96.00%. Gen: Patient is well developed, well nourished man in no acute distress.  Cardiac: RRR. S1S2 audible.   Extremities: Cap refill <2 secs. No cyanosis or edema. Pulses 2+ radial and DP. Pulmonary: Respirations regular, symmetric. Lungs clear to auscultation bilat. Abd: Soft, non-tender. BS audible x 4 quadrants.  G/U: Deferred  MS: Alert, follows commands. Oriented to person, place, time, and event. Speech: Speech fluent and non-dysarthric. Able to name and repeat.   CN: No visual field cut. PERRL. EOMs intact. Facial sensation decreased on the  left but split at midline. No facial droop. Hearing symmetrical. Strong cough. Sternocleidomastoids 5/5 strength. Tongue midline, full strength, no atrophy or fasciculations.  Strength: 5/5 in BUE and BLE proximally and distally.  Sensation: Intact to light touch and temp in face, arms,and legs.  Coordination/Proprioception: No ataxia or dysmetria on FTN or HTS bilat.   Did not test gait or Romberg.   Reflexes: 1+ biceps, brachioradialis bilat. 2+ patellar, 2+ achilles bilat. Downgoing toes bilat.  NIHSS 0  ASSESSMENT/PLAN Mr. Justin Wells is a 63 y.o. male with hx of stroke risk factors including HTN, hyperlipidemia, CAD s/p stent deployment x 3, MI, PAD s/p femoral stenting, TIA's, and prior ischemic stroke in the  setting of acute MI in 2014. He presented with right arm weakness-numbness, chest pain, tremors. Patient was not given tPA due to mild symptoms.  Imaging confirms no acute infarct but mild chronic microvascular ischemic changes. He has multiple MRIs in the past but no acute stroke and even did not have evidence of previous stroke. On aspirin 81 mg orally every day and clopidogrel 75 mg orally every day prior to admission. Now on aspirin 81 mg orally every day and clopidogrel 75 mg orally every day for secondary stroke/cardiac prevention. Stroke workup negative. CTA head neck not flow limiting. MRI brain and C-spine unremarkable, even did not see evidence of prior stroke. He has a lot of stress which may explain his functional component. On paxil for depression. I feel his episodes are related to panic attack at the setting of triggers such as neck pain. Started clonazepam 0.25mg  bid for panic attack and added soft C-collar for neck pain.   LE numbness. Likely related to diabetic neuropathy. PT recommends OP therapy, however, pt feels insurance may not cover and he is not interested in pursuing if it does not - it is a $35 co-pay per visit and he says he cannot afford and will not pursue.  Can consider OP nerve conduction.  No further stroke workup indicated.  Stroke Service will sign off. Please call should any needs arise.  Follow up with Dr [name],  Stroke Clinic, in 2 months.  Hospital day # 3  SIGNED Annie Main, MSN, RN, ANVP-BC, ANP-BC, [facility] Pager: [phone] 03/10/2014 10:15 AM  I, the attending vascular neurologist, have personally obtained a history, examined the patient, evaluated laboratory data and imaging studies, and formulated the assessment and plan of care.  I have made any additions or clarifications directly to the above note and agree with the findings and plan as currently documented.  Marvel Plan, MD PhD 03/10/2014 10:11 AM    To contact Stroke Continuity provider, please refer to [email or web address]. After hours, contact General Neurology

## 2014-03-10 NOTE — Progress Notes (Signed)
Orthopedic Tech Progress Note Patient Details:  Justin Wells August 11, 1951 [id number]  Ortho Devices Type of Ortho Device: Soft collar   [name], [name] 03/10/2014, 1:37 AM

## 2014-03-10 NOTE — Progress Notes (Signed)
Patient Name: Justin Wells Date of Encounter: 03/10/2014     Active Problems:   CVA (cerebral infarction)   Atherosclerosis of coronary artery   TIA (transient ischemic attack)   Chest pain   PAD (peripheral artery disease)   Anxiety state, unspecified   Chronic pain syndrome    SUBJECTIVE  Minimal chest discomfort. Denies any SOB.  CURRENT MEDS . aspirin EC  81 mg Oral Daily  . atorvastatin  80 mg Oral Q0600  . clonazePAM  0.25 mg Oral BID  . clopidogrel  75 mg Oral Daily  . enoxaparin (LOVENOX) injection  40 mg Subcutaneous Q24H  . famotidine  20 mg Oral BID  . lisinopril  5 mg Oral Daily  . multivitamin with minerals  1 tablet Oral Daily  . omega-3 acid ethyl esters  1 g Oral Daily  . PARoxetine  20 mg Oral Daily    OBJECTIVE  Filed Vitals:   03/09/14 1651 03/09/14 1938 03/10/14 0001 03/10/14 0527  BP: 119/76 133/65 107/68 115/74  Pulse: 68 78 64 63  Temp: 97.8 F (36.6 C) 97.8 F (36.6 C) 97.5 F (36.4 C) 97.4 F (36.3 C)  TempSrc: Oral Oral Oral Oral  Resp: 18 18 18 20   Height:      Weight:   204 lb 9.4 oz (92.8 kg)   SpO2: 95% 97% 97% 96%    Intake/Output Summary (Last 24 hours) at 03/10/14 0653 Last data filed at 03/10/14 0527  Gross per 24 hour  Intake    700 ml  Output   2875 ml  Net  -2175 ml   Filed Weights   03/07/14 2005 03/08/14 1903 03/10/14 0001  Weight: 211 lb (95.709 kg) 207 lb 3.7 oz (94 kg) 204 lb 9.4 oz (92.8 kg)    PHYSICAL EXAM  General: Pleasant, NAD. Neuro: Alert and oriented X 3. Moves all extremities spontaneously. Psych: Normal affect. HEENT:  Normal. C-collar in place  Neck: Supple without bruits or JVD. Lungs:  Resp regular and unlabored, anterior exam CTA (C-collar on). Heart: RRR no s3, s4, or murmurs. Abdomen: Soft, non-tender, non-distended, BS + x 4.  Extremities: No clubbing, cyanosis or edema. DP/PT/Radials 2+ and equal bilaterally. R radial cath site clean, dry, intact, without significant bleeding or  hematoma  Accessory Clinical Findings  CBC  Recent Labs  03/07/14 2016 03/07/14 2025  WBC 9.5  --   NEUTROABS 6.2  --   HGB 14.1 14.6  HCT 40.8 43.0  MCV 85.9  --   PLT 205  --    Basic Metabolic Panel  Recent Labs  03/07/14 2016 03/07/14 2025  NA 139 137  K 4.4 4.1  CL 100 103  CO2 25  --   GLUCOSE 94 95  BUN 14 13  CREATININE 1.08 1.20  CALCIUM 9.0  --    Liver Function Tests  Recent Labs  03/07/14 2016  AST 24  ALT 20  ALKPHOS 86  BILITOT 0.3  PROT 6.9  ALBUMIN 3.9   Cardiac Enzymes  Recent Labs  03/07/14 2200 03/08/14 0440 03/08/14 0958  TROPONINI <0.30 <0.30 <0.30   Hemoglobin A1C  Recent Labs  03/08/14 0440  HGBA1C 5.6   Fasting Lipid Panel  Recent Labs  03/08/14 0440  CHOL 151  HDL 40  LDLCALC 79  TRIG 160*  CHOLHDL 3.8    TELE  NSR with HR 60-80s, no significant ventricular ectopy  ECG  NSR with HR 60s, no significant ST-T wave changes  Radiology/Studies  Dg Chest 1 View  03/07/2014   CLINICAL DATA:  Chest pain.  Shortness of breath.  Previous stroke.  EXAM: CHEST - 1 VIEW  COMPARISON:  12/27/2013  FINDINGS: The heart size and mediastinal contours are within normal limits. Both lungs are clear. The visualized skeletal structures are unremarkable.  IMPRESSION: No active disease.   Electronically Signed   By: Myles Rosenthal M.D.   On: 03/07/2014 23:17   Ct Head Wo Contrast  03/07/2014   CLINICAL DATA:  Right arm and leg drift.  EXAM: CT HEAD WITHOUT CONTRAST  TECHNIQUE: Contiguous axial images were obtained from the base of the skull through the vertex without contrast.  COMPARISON:  MRI of the brain 01/14/2014.  CT head 01/14/2014.  FINDINGS: Normal appearance of the intracranial structures. No evidence for acute hemorrhage, mass lesion, midline shift, hydrocephalus or large infarct. No acute bony abnormality. The visualized sinuses are clear. Previous sinus disease has cleared.  IMPRESSION: No acute intracranial abnormality.   Stable appearance from priors.   Electronically Signed   By: Davonna Belling M.D.   On: 03/07/2014 21:05   Mr Brain Wo Contrast  03/07/2014   CLINICAL DATA:  Right-sided visual loss with blurry vision.  EXAM: MRI HEAD WITHOUT CONTRAST  TECHNIQUE: Multiplanar, multiecho pulse sequences of the brain and surrounding structures were obtained without intravenous contrast.  COMPARISON:  Prior CT from earlier the same day as well as prior MRI from 01/14/2014  FINDINGS: Mild diffuse prominence of the CSF containing spaces is compatible with age-appropriate atrophy. Scattered T2/FLAIR subcentimeter hyperintensities within the periventricular and deep white matter both cerebral hemispheres are most consistent with mild chronic microvascular ischemic changes.  No mass lesion, midline shift, or extra-axial fluid collection. Ventricles are normal in size without evidence of hydrocephalus.  No diffusion-weighted signal abnormality is identified to suggest acute intracranial infarct. Gray-white matter differentiation is maintained. Normal flow voids are seen within the intracranial vasculature. No intracranial hemorrhage identified.  The cervicomedullary junction is normal. Pituitary gland is within normal limits. Pituitary stalk is midline. The globes and optic nerves demonstrate a normal appearance with normal signal intensity.  The bone marrow signal intensity is normal. Calvarium is intact. Visualized upper cervical spine is within normal limits.  Scalp soft tissues are unremarkable.  Mild T2 hyperintense mucosal thickening noted within the maxillary and ethmoidal sinuses bilaterally. Minimal mucosal thickening also present within the right frontal sinus. No mastoid effusion.  IMPRESSION: 1. Stable exam with no acute intracranial infarct or other abnormality. 2. Mild chronic microvascular ischemic changes.   Electronically Signed   By: Rise Mu M.D.   On: 03/07/2014 22:44   Ct Angio Chest Aortic Dissect W &/or  W/o  03/07/2014   CLINICAL DATA:  Chest pain.  EXAM: CT ANGIOGRAPHY CHEST, ABDOMEN AND PELVIS  TECHNIQUE: Multidetector CT imaging through the chest, abdomen and pelvis was performed using the standard protocol during bolus administration of intravenous contrast. Multiplanar reconstructed images and MIPs were obtained and reviewed to evaluate the vascular anatomy.  CONTRAST:  OMNIPAQUE IOHEXOL 350 MG/ML SOLN  COMPARISON:  CT scan of December 27, 2013.  FINDINGS: CTA CHEST FINDINGS  No pneumothorax or pleural effusion is noted. No acute pulmonary disease is noted. No mediastinal mass or adenopathy is noted mild coronary artery calcifications are noted. Moderate hiatal hernia is noted. There is no evidence of thoracic aortic dissection or aneurysm. 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 There is no evidence of pulmonary  embolus.  Review of the MIP images confirms the above findings.  CTA ABDOMEN AND PELVIS FINDINGS  No focal abnormality is noted in the liver, spleen or pancreas. Cholelithiasis is noted. No evidence of aneurysm or dissection is seen involving the abdominal aorta. Atherosclerotic calcifications are noted in the abdominal aorta and iliac arteries. The mesenteric and renal arteries are widely patent without significant stenosis. Adrenal glands and kidneys appear normal. No hydronephrosis or renal obstruction is noted. No evidence of bowel obstruction is noted. The appendix appears normal. Urinary bladder appears normal. Stable mild bilateral fat containing inguinal hernias are noted.  Review of the MIP images confirms the above findings.  IMPRESSION: No evidence of thoracic or abdominal aortic aneurysm or dissection.  Stable hiatal hernia compared to prior exam.  Cholelithiasis.  Stable mild bilateral fat containing inguinal hernias.   Electronically Signed   By: Roque LiasJames  Green M.D.   On: 03/07/2014 21:17   Ct Angio Abd/pel W/ And/or W/o  03/07/2014   CLINICAL DATA:   Chest pain.  EXAM: CT ANGIOGRAPHY CHEST, ABDOMEN AND PELVIS  TECHNIQUE: Multidetector CT imaging through the chest, abdomen and pelvis was performed using the standard protocol during bolus administration of intravenous contrast. Multiplanar reconstructed images and MIPs were obtained and reviewed to evaluate the vascular anatomy.  CONTRAST:  100mL OMNIPAQUE IOHEXOL 350 MG/ML SOLN  COMPARISON:  CT scan of December 27, 2013.  FINDINGS: CTA CHEST FINDINGS  No pneumothorax or pleural effusion is noted. No acute pulmonary disease is noted. No mediastinal mass or adenopathy is noted mild coronary artery calcifications are noted. Moderate hiatal hernia is noted. There is no evidence of thoracic aortic dissection or aneurysm. 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 There is no evidence of pulmonary embolus.  Review of the MIP images confirms the above findings.  CTA ABDOMEN AND PELVIS FINDINGS  No focal abnormality is noted in the liver, spleen or pancreas. Cholelithiasis is noted. No evidence of aneurysm or dissection is seen involving the abdominal aorta. Atherosclerotic calcifications are noted in the abdominal aorta and iliac arteries. The mesenteric and renal arteries are widely patent without significant stenosis. Adrenal glands and kidneys appear normal. No hydronephrosis or renal obstruction is noted. No evidence of bowel obstruction is noted. The appendix appears normal. Urinary bladder appears normal. Stable mild bilateral fat containing inguinal hernias are noted.  Review of the MIP images confirms the above findings.  IMPRESSION: No evidence of thoracic or abdominal aortic aneurysm or dissection.  Stable hiatal hernia compared to prior exam.  Cholelithiasis.  Stable mild bilateral fat containing inguinal hernias.   Electronically Signed   By: Roque LiasJames  Green M.D.   On: 03/07/2014 21:17    ASSESSMENT AND PLAN  1. Atypical chest pain, intermittent since 2009  - widely patent stent  in mid LCx, EF 60%  - Check BMET, if Cr stable, can clear to be discharged from cardiac perspective   2. CAD  - 2009 PCI of his second marginal and circumflex  - PTCA of his diagonal and first marginal  - Echo 03/08/14 EF 60-65%, no regional wall motion  3. questionable TIA  - MRI/MRA negative for acute stroke  - per Neuro, likely panic attack in the setting of severe neck pain  - recommended clonazepam 0.25mg  BID and C-collar  4. HTN  Signed, Azalee CourseMeng, Hao PA-C Pager: [phone]  Patient seen and examined and history reviewed. Agree with above findings and plan. Feeling better today. Still complains of neck pain.  No radial site hematoma. Cath results reviewed. No obstructive disease. OK for DC today from cardiac standpoint.  Bostyn Kunkler [place], MDFACC 03/10/2014 9:13 AM

## 2014-03-10 NOTE — Progress Notes (Signed)
UR Completed Sidhant Helderman Graves-Bigelow, RN,BSN [phone]  

## 2014-03-10 NOTE — Discharge Summary (Signed)
Physician Discharge Summary  Justin ChimesGeorge F Wells RUE:[id number]:[id number] DOB: 1951-04-25 DOA: 03/07/2014  PCP: [name],[name], MD  Admit date: 03/07/2014 Discharge date: 03/10/2014  Time spent: >30 minutes  Recommendations for Outpatient Follow-up:  Follow-up Information   Follow up with [name],[name], MD. Schedule an appointment as soon as possible for a visit in 2 months. (Stroke Clinic)    Specialties:  Neurology, Radiology   Contact information:   [address] [place] [address] [phone]       Follow up with [facility]. (Office will call patient to schedule follow up in 1-2 month, if you do not hear from us in 2 days, please call office to schedule follow up)    Specialty:  Cardiology   Contact information:   [address], [address] [place] [address] [phone]      Follow up with [name],[name], MD. (in 1-2weeks, call for appt upon discharge)    Specialty:  Family Medicine   Contact information:   [address] [place] [address] [phone]        Discharge Diagnoses:  Active Problems:   CVA (cerebral infarction)   Atherosclerosis of coronary artery   TIA (transient ischemic attack)   Chest pain   PAD (peripheral artery disease)   Anxiety state, unspecified   Chronic pain syndrome   Discharge Condition: Improved/stable  Diet recommendation: Low sodium heart healthy  Filed Weights   03/07/14 2005 03/08/14 1903 03/10/14 0001  Weight: 95.709 kg (211 lb) 94 kg (207 lb 3.7 oz) 92.8 kg (204 lb 9.4 oz)    History of present illness:  Patient is a 63 y.o. year old male with significant past medical history of CAD s/p stenting, PVD s/p femoral stenting, Hx/o TIA/CVA s/p tPa 4/15, seizures, depression presenting with R arm weakness, chest pain, tremors. Per report, pt was noticed by wife to start suddenly trembling while sitting on the couch in addition to becoming diaphoretic with weakness/numbness on R side. EMS  called by wife. Pt brought to ER as code stroke. Pt reports that he developed some CP, dyspnea, diaphoresid while en route to ER. Was given SL NTG x2 with minimal to mild improvement in sxs. 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 However, sxs were somewhat dissimilar to previous episodes apart from CP. Was previously on seizure medication. However, this was d/c'd years ago per pt. No LOC assd with tremor at home. Denies ETOH abuse.  Presented to ER hemodynamically stable. Afebrile. Labs WNL. Trop and EKG WNL. CT angio of chest and abd ordered to r/o dissection, which was negative. Head CT WNL. Code stroke was called. Neuro assessed. Not a tPa candidate as he has had this in the past. NIHSS 2. Recs were for repeat MRI. He was admitted for further evaluation and management.   Hospital Course:  . R arm weakness/numbness  -improved back to baseline  -Presented with similar symptoms 4/15, received Tpa, MRI negative then, and also h/o prior CVA  -MRI negative  -COntinue ASA/plavix  -NEuro was consulted and initially it was that that he possibly had a TIA but on followup Dr. 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 He was started on clonazepam twice a day which is to continue on discharge and a c-collar was also ordered. Patient is clinically improved  at this time, he is to followup with neurology outpatient and his preference is to see Dr. [name] in the clinic 2. Chest pain/pressure  -EKG and cardiac enzymes negative  -H/o CAD and Stents to CFx/OM in 2009, since then reports having MI @ [facility] in setting of Nissen Fundoplication Sx  -He was placed on Lipitor per cardiology, and maintained on Plavix aspirin and lisinopril -Patient had a cardiac cath on 7/8 and it showed Widely patent stent in the mid left circumflex with no evidence of obstructive disease. Medical  therapy was recommended which is to continue follow up outpatient.  3. Anxiety disorder/panic attacks -continue paxil  -As above patient was discussed with neuro Dr. [name] and his impression was that the patient's presenting symptoms were likely panic attacks precipitated by his severe neck pain. He was started on clonazepam 0.25BID as per neuro recs and is to continue this and follow up outpatient.  4. HTN -stable  5. Chronic pain  -percocet changed to Q4prn as per pt's request  Procedures:  Cardiac cath on 7/8   Final Conclusions:  1. Widely patent stent in the mid left circumflex with no evidence of obstructive disease.  2. Normal LV systolic function with mildly elevated left ventricular end-diastolic pressure.  Recommendations:  continue medical therapy.  Consultations:  Cardiology  Neurology  Discharge Exam: Filed Vitals:   03/10/14 0708  BP: 117/69  Pulse: 67  Temp: 97.5 F (36.4 C)  Resp: 20   Exam:  General: AAOx3, IN NAD, c-collar in place  Cardiovascular: S1S2/RRR  Respiratory: CTAB  Abdomen: soft, Nt, BS present  Musculoskeletal: no edema c/c  NEuro: motor 5/5 B/L, sensory light touch intact  DTR 2plus    Discharge Instructions You were cared for by a hospitalist during your hospital stay. If you have any questions about your discharge medications or the care you received while you were in the hospital after you are discharged, you can call the unit and asked to speak with the hospitalist on call if the hospitalist that took care of you is not available. Once you are discharged, your primary care physician will handle any further medical issues. Please note that NO REFILLS for any discharge medications will be authorized once you are discharged, as it is imperative that you return to your primary care physician (or establish a relationship with a primary care physician if you do not have one) for your aftercare needs so that they can reassess your need for  medications and monitor your lab values.  Discharge Instructions   Diet - low sodium heart healthy    Complete by:  As directed      Discharge instructions    Complete by:  As directed   C- COLLAR as directed per Neuro     Increase activity slowly    Complete by:  As directed             Medication List         aspirin EC 81 MG tablet  Take 81 mg by mouth daily.     clonazePAM 0.5 MG tablet  Commonly known as:  KLONOPIN  Take 0.5 tablets (0.25 mg total) by mouth 2 (two) times daily.     clopidogrel 75 MG tablet  Commonly known as:  PLAVIX  Take 1 tablet (75 mg total) by mouth daily.     cyclobenzaprine 10 MG tablet  Commonly known as:  FLEXERIL  Take 5 mg by mouth 2 (two) times daily as needed  for muscle spasms.     fish oil-omega-3 fatty acids 1000 MG capsule  Take 1 g by mouth 2 (two) times daily.     lisinopril 5 MG tablet  Commonly known as:  PRINIVIL,ZESTRIL  Take 1 tablet (5 mg total) by mouth daily.     nitroGLYCERIN 0.4 MG SL tablet  Commonly known as:  NITROSTAT  Place 0.4 mg under the tongue every 5 (five) minutes as needed for chest pain.     ONE-A-DAY MENS VITACRAVES PO  Take 1 tablet by mouth daily.     oxyCODONE-acetaminophen 5-325 MG per tablet  Commonly known as:  PERCOCET/ROXICET  Take 1 tablet by mouth every 6 (six) hours as needed for moderate pain.     PARoxetine 20 MG tablet  Commonly known as:  PAXIL  Take 1 tablet (20 mg total) by mouth every morning.     ranitidine 150 MG tablet  Commonly known as:  ZANTAC  Take 1 tablet (150 mg total) by mouth 2 (two) times daily.       Lipitor 80 mg by mouth daily No Known Allergies     Follow-up Information   Follow up with [name],[name], MD. Schedule an appointment as soon as possible for a visit in 2 months. (Stroke Clinic)    Specialties:  Neurology, Radiology   Contact information:   [address] [place] [address] [phone]       Follow up with [facility]. (Office will call patient to schedule follow up in 1-2 month, if you do not hear from [place] in 2 days, please call office to schedule follow up)    Specialty:  Cardiology   Contact information:   [address] [place] [address] [phone]      Follow up with Carmin Richmond, MD. (in 1-2weeks, call for appt upon discharge)    Specialty:  Family Medicine   Contact information:   [address] [place] [address] [phone]        The results of significant diagnostics from this hospitalization (including imaging, microbiology, ancillary and laboratory) are listed below for reference.    Significant Diagnostic Studies: Dg Chest 1 View  03/07/2014   CLINICAL DATA:  Chest pain.  Shortness of breath.  Previous stroke.  EXAM: CHEST - 1 VIEW  COMPARISON:  12/27/2013  FINDINGS: The heart size and mediastinal contours are within normal limits. Both lungs are clear. The visualized skeletal structures are unremarkable.  IMPRESSION: No active disease.   Electronically Signed   By: Myles Rosenthal M.D.   On: 03/07/2014 23:17   Ct Head Wo Contrast  03/07/2014   CLINICAL DATA:  Right arm and leg drift.  EXAM: CT HEAD WITHOUT CONTRAST  TECHNIQUE: Contiguous axial images were obtained from the base of the skull through the vertex without contrast.  COMPARISON:  MRI of the brain 01/14/2014.  CT head 01/14/2014.  FINDINGS: Normal appearance of the intracranial structures. No evidence for acute hemorrhage, mass lesion, midline shift, hydrocephalus or large infarct. No acute bony abnormality. The visualized sinuses are clear. Previous sinus disease has cleared.  IMPRESSION: No acute intracranial abnormality.  Stable appearance from priors.   Electronically Signed   By: Davonna Belling M.D.   On: 03/07/2014 21:05   Mr Brain Wo Contrast  03/07/2014   CLINICAL DATA:  Right-sided visual loss with blurry vision.  EXAM: MRI HEAD WITHOUT CONTRAST  TECHNIQUE: Multiplanar,  multiecho pulse  sequences of the brain and surrounding structures were obtained without intravenous contrast.  COMPARISON:  Prior CT from earlier the same day as well as prior MRI from 01/14/2014  FINDINGS: Mild diffuse prominence of the CSF containing spaces is compatible with age-appropriate atrophy. Scattered T2/FLAIR subcentimeter hyperintensities within the periventricular and deep white matter both cerebral hemispheres are most consistent with mild chronic microvascular ischemic changes.  No mass lesion, midline shift, or extra-axial fluid collection. Ventricles are normal in size without evidence of hydrocephalus.  No diffusion-weighted signal abnormality is identified to suggest acute intracranial infarct. Gray-white matter differentiation is maintained. Normal flow voids are seen within the intracranial vasculature. No intracranial hemorrhage identified.  The cervicomedullary junction is normal. Pituitary gland is within normal limits. Pituitary stalk is midline. The globes and optic nerves demonstrate a normal appearance with normal signal intensity.  The bone marrow signal intensity is normal. Calvarium is intact. Visualized upper cervical spine is within normal limits.  Scalp soft tissues are unremarkable.  Mild T2 hyperintense mucosal thickening noted within the maxillary and ethmoidal sinuses bilaterally. Minimal mucosal thickening also present within the right frontal sinus. No mastoid effusion.  IMPRESSION: 1. Stable exam with no acute intracranial infarct or other abnormality. 2. Mild chronic microvascular ischemic changes.   Electronically Signed   By: Rise Mu M.D.   On: 03/07/2014 22:44   Ct Angio Chest Aortic Dissect W &/or W/o  03/07/2014   CLINICAL DATA:  Chest pain.  EXAM: CT ANGIOGRAPHY CHEST, ABDOMEN AND PELVIS  TECHNIQUE: Multidetector CT imaging through the chest, abdomen and pelvis was performed using the standard protocol during bolus administration of intravenous  contrast. Multiplanar reconstructed images and MIPs were obtained and reviewed to evaluate the vascular anatomy.  CONTRAST:  OMNIPAQUE IOHEXOL 350 MG/ML SOLN  COMPARISON:  CT scan of December 27, 2013.  FINDINGS: CTA CHEST FINDINGS  No pneumothorax or pleural effusion is noted. No acute pulmonary disease is noted. No mediastinal mass or adenopathy is noted mild coronary artery calcifications are noted. Moderate hiatal hernia is noted. There is no evidence of thoracic aortic dissection or aneurysm. 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 There is no evidence of pulmonary embolus.  Review of the MIP images confirms the above findings.  CTA ABDOMEN AND PELVIS FINDINGS  No focal abnormality is noted in the liver, spleen or pancreas. Cholelithiasis is noted. No evidence of aneurysm or dissection is seen involving the abdominal aorta. Atherosclerotic calcifications are noted in the abdominal aorta and iliac arteries. The mesenteric and renal arteries are widely patent without significant stenosis. Adrenal glands and kidneys appear normal. No hydronephrosis or renal obstruction is noted. No evidence of bowel obstruction is noted. The appendix appears normal. Urinary bladder appears normal. Stable mild bilateral fat containing inguinal hernias are noted.  Review of the MIP images confirms the above findings.  IMPRESSION: No evidence of thoracic or abdominal aortic aneurysm or dissection.  Stable hiatal hernia compared to prior exam.  Cholelithiasis.  Stable mild bilateral fat containing inguinal hernias.   Electronically Signed   By: Roque Lias M.D.   On: 03/07/2014 21:17   Ct Angio Abd/pel W/ And/or W/o  03/07/2014   CLINICAL DATA:  Chest pain.  EXAM: CT ANGIOGRAPHY CHEST, ABDOMEN AND PELVIS  TECHNIQUE: Multidetector CT imaging through the chest, abdomen and pelvis was performed using the standard protocol during bolus administration of intravenous contrast. Multiplanar reconstructed  images and MIPs were obtained and reviewed to evaluate the vascular anatomy.  CONTRAST:  OMNIPAQUE IOHEXOL 350 MG/ML SOLN  COMPARISON:  CT scan of December 27, 2013.  FINDINGS: CTA CHEST FINDINGS  No pneumothorax or pleural effusion is noted. No acute pulmonary disease is noted. No mediastinal mass or adenopathy is noted mild coronary artery calcifications are noted. Moderate hiatal hernia is noted. There is no evidence of thoracic aortic dissection or aneurysm. 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 There is no evidence of pulmonary embolus.  Review of the MIP images confirms the above findings.  CTA ABDOMEN AND PELVIS FINDINGS  No focal abnormality is noted in the liver, spleen or pancreas. Cholelithiasis is noted. No evidence of aneurysm or dissection is seen involving the abdominal aorta. Atherosclerotic calcifications are noted in the abdominal aorta and iliac arteries. The mesenteric and renal arteries are widely patent without significant stenosis. Adrenal glands and kidneys appear normal. No hydronephrosis or renal obstruction is noted. No evidence of bowel obstruction is noted. The appendix appears normal. Urinary bladder appears normal. Stable mild bilateral fat containing inguinal hernias are noted.  Review of the MIP images confirms the above findings.  IMPRESSION: No evidence of thoracic or abdominal aortic aneurysm or dissection.  Stable hiatal hernia compared to prior exam.  Cholelithiasis.  Stable mild bilateral fat containing inguinal hernias.   Electronically Signed   By: Roque Lias M.D.   On: 03/07/2014 21:17    Microbiology: No results found for this or any previous visit (from the past 240 hour(s)).   Labs: Basic Metabolic Panel:  Recent Labs Lab 03/07/14 2016 03/07/14 2025 03/10/14 0820  NA 139 137 137  K 4.4 4.1 4.7  CL 100 103 98  CO2 25  --  24  GLUCOSE 94 95 138*  BUN 14 13 7   CREATININE 1.08 1.20 0.87  CALCIUM 9.0  --  8.8   Liver  Function Tests:  Recent Labs Lab 03/07/14 2016  AST 24  ALT 20  ALKPHOS 86  BILITOT 0.3  PROT 6.9  ALBUMIN 3.9   No results found for this basename: LIPASE, AMYLASE,  in the last 168 hours No results found for this basename: AMMONIA,  in the last 168 hours CBC:  Recent Labs Lab 03/07/14 2016 03/07/14 2025  WBC 9.5  --   NEUTROABS 6.2  --   HGB 14.1 14.6  HCT 40.8 43.0  MCV 85.9  --   PLT 205  --    Cardiac Enzymes:  Recent Labs Lab 03/07/14 2200 03/08/14 0440 03/08/14 0958  TROPONINI <0.30 <0.30 <0.30   BNP: BNP (last 3 results) No results found for this basename: PROBNP,  in the last 8760 hours CBG:  Recent Labs Lab 03/08/14 1350  GLUCAP 123*       Signed:  Staria Birkhead C  [facility] 03/10/2014, 1:34 PM

## 2014-04-06 ENCOUNTER — Ambulatory Visit: Payer: BC Managed Care – PPO | Admitting: Neurology

## 2014-04-08 ENCOUNTER — Ambulatory Visit (INDEPENDENT_AMBULATORY_CARE_PROVIDER_SITE_OTHER): Payer: Medicare Other | Admitting: Neurology

## 2014-04-08 ENCOUNTER — Encounter: Payer: Self-pay | Admitting: Neurology

## 2014-04-08 VITALS — BP 122/70 | HR 75 | Ht 69.5 in | Wt 211.4 lb

## 2014-04-08 DIAGNOSIS — M542 Cervicalgia: Secondary | ICD-10-CM

## 2014-04-08 DIAGNOSIS — I6330 Cerebral infarction due to thrombosis of unspecified cerebral artery: Secondary | ICD-10-CM

## 2014-04-08 MED ORDER — TOPIRAMATE 50 MG PO TABS: 50.0000 mg | ORAL_TABLET | Freq: Two times a day (BID) | ORAL | Status: DC

## 2014-04-08 NOTE — Progress Notes (Signed)
PATIENT: Justin Wells DOB: 1951/07/23  REASON FOR VISIT: hospital follow up for stroke and complicated Migraine HISTORY FROM: patient  HISTORY OF PRESENT ILLNESS: Justin Wells is an 63 y.o. male with a past medical history significant for HTN, hyperlipidemia, CAD s/p stent deployment x 3, MI, PAD s/p femoral stenting, TIA's, ischemic stroke in the setting of acute MI 2014, brought to [facility] ED by ambulance as a code stroke due to acute onset of right hemiparesis, right facial droop 12/27/2013 at 1100. He said that he was home sitting in a couch when started feeling weak all over, developed severe dizziness " like room spinning" and then ambulance was called. According to patient and EMS, weakness of the right arm-leg and face as well as chest pain developed while in the truck. No double vision, difficulty swallowing, slurred speech, language or vision impairment. Chest pain radiated to his shoulders and back. NIHSS 4, BUT WITH SOME INCONSISTENCIES (1 for sensory, 1 for right arm weakness, 2 for right arm weakness). CT brain showed a subtle decreased attenuation in the lower right mid brain and upper right pons, concerning for acute ischemia in this area. CT angio head and neck showed atherosclerotic changes but no high grade stenosis.  Patient underwent CTA chest that showed no evidence of dissection or PE. He takes plavix daily. Patient was administered TPA; it was delayed due to CP and concern for aortic dissection/PE. He was admitted to the neuro ICU for further evaluation and treatment.  Imaging at that time did not confirm acute infarct. He was believed to have a left brain posterior circulation small infarct not visualized on MRI.   Patient returned to hospital on 01/14/14 after noting a sudden onset of posterior neck discomfort, vertigo, blurred vision and bilateral LE weakness.  EMS arrived at house and transported him to [facility] as a Code stroke. On arrival patient states he did have history of  migraine HA which are very similar in nature with HA, blurred vision and bilateral LE weakness. Initial CT head was negative.  MRI was negative for acute infarct.  He was diagnosed with Complicated Migraine and his Gabapentin was started at 300 mg tid as a preventative for Migraine.  Since discharge in April, he has right-sided weakness and numbness in the right hand and foot.  He states his hearing is worse in his right ear.  He has continued to have spells where he feels hot around his head and neck, will have sharp pain in the neck, with or without headache, and feels weak in his legs. During these episodes wife states he is slow to respond.  His wife states that when this happens, he must go lie down for several hours.  These episodes are occuring every 2-3 days.  He continues to take Plavix and aspirin daily.  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  He is not driving.  Wife states he was ordered outpatient PT but he cannot afford to go.  Wife is on permanent disability.  Blood pressure is well controlled. Hgba1c 5.4. LDL 85 Urgent Revisit 04/08/2014 : Patient is seen today urgently following repeat hospital admission last month. He presented with right arm weakness, chest pain, tremors and became diaphoretic. Code stroke was called the patient's neurological exam had functional features. CT head was unremarkable. Subsequent MRI scan of the brain was obtained which showed no acute infarct. Patient complained of severe neck pain and right-sided numbness. He was asked  to wear a cervical collar. He was seen by Dr. [name] and thought to have anxiety panic attacks. He  Was started on clonazepam 0.25 mg twice daily and Topamax 50 mg daily. Patient feels that he still has neck pain when he moves his neck and feels better after wearing the neck collar. He still has some trouble walking and uses a cane and feels that his right side of the body is numb. He had CT angiogram of the chest, abdomen and pelvis during  the hospitalization which showed no significant abnormalities as well. He outpatient EEG done on 6/9/5 which was read by me and normal REVIEW OF SYSTEMS: Full 14 system review of systems performed and notable only for:  Excessive sweating, facial swelling, hearing loss, drooling, flushing, dizziness, headache, numbness, weakness, back pain, muscle cramps, neck pain and stiffness, anxiety and snoring.  ALLERGIES: No Known Allergies  HOME MEDICATIONS: Outpatient Prescriptions Prior to Visit  Medication Sig Dispense Refill  . aspirin EC 81 MG tablet Take 81 mg by mouth daily.      Marland Kitchen atorvastatin (LIPITOR) 80 MG tablet Take 1 tablet (80 mg total) by mouth daily at 6 (six) AM.  30 tablet  0  . clonazePAM (KLONOPIN) 0.5 MG tablet Take 0.5 tablets (0.25 mg total) by mouth 2 (two) times daily.  60 tablet  0  . clopidogrel (PLAVIX) 75 MG tablet Take 1 tablet (75 mg total) by mouth daily.  90 tablet  3  . cyclobenzaprine (FLEXERIL) 10 MG tablet Take 5 mg by mouth 2 (two) times daily as needed for muscle spasms.       . fish oil-omega-3 fatty acids 1000 MG capsule Take 1 g by mouth 2 (two) times daily.       Marland Kitchen lisinopril (PRINIVIL,ZESTRIL) 5 MG tablet Take 1 tablet (5 mg total) by mouth daily.  30 tablet  1  . Multiple Vitamins-Minerals (ONE-A-DAY MENS VITACRAVES PO) Take 1 tablet by mouth daily.       . nitroGLYCERIN (NITROSTAT) 0.4 MG SL tablet Place 0.4 mg under the tongue every 5 (five) minutes as needed for chest pain.      Marland Kitchen oxyCODONE-acetaminophen (PERCOCET/ROXICET) 5-325 MG per tablet Take 1 tablet by mouth every 6 (six) hours as needed for moderate pain.      Marland Kitchen PARoxetine (PAXIL) 20 MG tablet Take 1 tablet (20 mg total) by mouth every morning.  30 tablet  1  . ranitidine (ZANTAC) 150 MG tablet Take 1 tablet (150 mg total) by mouth 2 (two) times daily.  60 tablet  1   No facility-administered medications prior to visit.    PHYSICAL EXAM  Filed Vitals:   04/08/14 1027  BP: 122/70  Pulse:  75  Height: 5' 9.5" (1.765 m)  Weight: 95.89 kg (211 lb 6.4 oz)   Body mass index is 30.78 kg/(m^2). No exam data present Generalized: Well developed middle aged Caucasian male, in no acute distress  Head: normocephalic and atraumatic.   Neck: Supple, no carotid bruits  Cardiac: Regular rate rhythm, no murmur  Musculoskeletal: No deformity, limited ROM with flexion and extension in neck, due to cervical collar.  Neurological examination  Mentation: Alert oriented to time, place, history taking. Follows all commands speech and language fluent Cranial nerve II-XII:  Pupils were equal round reactive to light extraocular movements were full, visual field were full on confrontational test. Facial sensation and strength were normal, except mild right lower facial weakness.  Hearing was decreased to finger rubbing on  the right. Uvula tongue midline. Head turning  Limited by pain.  Shoulder shrug decreased on the right.  Tongue protrusion into cheek strength was normal. Motor: The motor testing reveals 5/5 strength of left-sided extremities. Right upper extremity 3/5 with give away effort. Right lower extremity 4/5 strength.  Good symmetric motor tone is noted throughout. Moderate weakness of the right grip and intrinsic hand muscles but poor effort. Sensory: Sensory testing is decreased to soft touch, and pinprick  And vibration on right-hemibody with splitting the midline including the forehead  Coordination: Cerebellar testing reveals good finger-nose-finger and heel-to-shin bilaterally.  Gait and station: Gait is antalgic. Tandem gait is unsteady. Romberg is negative.  Reflexes: Deep tendon reflexes are symmetric and normal bilaterally.   NIHSS: 2 MRs: 1  ASSESSMENT AND PLAN Justin Wells is a 63 y.o. male presenting with acute onset right hemiparesis on 12/27/13, status post IV t-PA. Imaging confirmed no acute infarct. Dx: left brain posterior circulation small infarct not visualized on  MRI versus TIA. Given history of sweating episodes w/ neuro changes that last 24h, concern for posterior circulation TIAs in past. Review of his old records reveal similar episode in 2009 with MRA of the neck showing no significant extracranial stenosis. Recent hospital admission for neck pain and right-sided weakness numbness in the setting of tremors and anxiety panic with negative imaging studies. I had a long discussion with the patient and wife regarding his recent episodes, discussed results of evaluation in the hospital and answered questions.  He continues to have episodes that may be complicated migraine vs. TIAs, or possibly even seizure.  I have advised him to stop Gabapentin as this has been no benefit.  Start Topirimate 50 mg each night for Complicated Migraine prevention.  We will check an EEG for seizure activity.  They were counseled on what may be a reoccurrence of stroke symptoms vs. New symptoms, but when in doubt, told to go to ER. Plan : I had a long discussion with the patient and his wife regarding his neck pain and numbness and my plan for evaluation, treatment and answered questions. Check MRI scan of cervical spine and increase Topamax to 50 mg twice daily and further if tolerated. We will remove his cervical collar he MRI does not show any bony pathology. Return for followup in 3 months exam, n.p.o. call earlier if necessary.     Orders Placed This Encounter  Procedures  . MR Cervical Spine Wo Contrast   Meds ordered this encounter  Medications  . topiramate (TOPAMAX) 50 MG tablet    Sig: Take 1 tablet (50 mg total) by mouth at bedtime.    Dispense:  30 tablet    Refill:  2    Order Specific Question:  Supervising Provider    Answer:     Return in about 3 months (around 07/09/2014). with Heide GuileLynn Lam, NP Delia HeadyPramod Roselind Klus, MD  04/08/2014, 11:24 AM [facility] [address], [address] [place], [place] [address] [phone]  Note: This document was prepared with  digital dictation and possible smart phrase technology. Any transcriptional errors that result from this process are unintentional.

## 2014-04-08 NOTE — Patient Instructions (Signed)
I had a long discussion with the patient and his wife regarding his neck pain and numbness and my plan for evaluation, treatment and answered questions. Check MRI scan of cervical spine and increase Topamax to 50 mg twice daily and further if tolerated. We will remove his cervical collar he MRI does not show any bony pathology. Return for followup in 3 months exam, n.p.o. call earlier if necessary.

## 2014-04-12 DIAGNOSIS — M542 Cervicalgia: Secondary | ICD-10-CM

## 2014-04-12 DIAGNOSIS — G8929 Other chronic pain: Secondary | ICD-10-CM | POA: Insufficient documentation

## 2014-04-12 DIAGNOSIS — F410 Panic disorder [episodic paroxysmal anxiety] without agoraphobia: Secondary | ICD-10-CM | POA: Insufficient documentation

## 2014-04-12 DIAGNOSIS — F419 Anxiety disorder, unspecified: Secondary | ICD-10-CM | POA: Insufficient documentation

## 2014-04-15 ENCOUNTER — Other Ambulatory Visit: Payer: Self-pay | Admitting: Neurology

## 2014-04-15 DIAGNOSIS — M542 Cervicalgia: Secondary | ICD-10-CM

## 2014-04-20 ENCOUNTER — Ambulatory Visit
Admission: RE | Admit: 2014-04-20 | Discharge: 2014-04-20 | Disposition: A | Discharge status: Home / Self Care | Payer: BC Managed Care – PPO | Source: Ambulatory Visit | Attending: Neurology | Admitting: Neurology

## 2014-04-20 DIAGNOSIS — M542 Cervicalgia: Secondary | ICD-10-CM

## 2014-04-24 ENCOUNTER — Inpatient Hospital Stay: Admission: RE | Admit: 2014-04-24 | Payer: BC Managed Care – PPO | Source: Ambulatory Visit

## 2014-04-26 ENCOUNTER — Ambulatory Visit
Admission: RE | Admit: 2014-04-26 | Discharge: 2014-04-26 | Disposition: A | Discharge status: Home / Self Care | Payer: BC Managed Care – PPO | Source: Ambulatory Visit | Attending: Neurology | Admitting: Neurology

## 2014-04-26 DIAGNOSIS — M542 Cervicalgia: Secondary | ICD-10-CM

## 2014-04-29 ENCOUNTER — Other Ambulatory Visit: Payer: BC Managed Care – PPO

## 2014-05-02 ENCOUNTER — Telehealth: Payer: Self-pay | Admitting: Neurology

## 2014-05-03 ENCOUNTER — Other Ambulatory Visit: Payer: Self-pay | Admitting: Surgery

## 2014-05-11 NOTE — Progress Notes (Addendum)
Late Entry Addendum for Initial Evaluation   03/08/14 1657  PT Time Calculation  PT Start Time 1630  PT Stop Time 1655  PT Time Calculation (min) 25 min  PT G-Codes **NOT FOR INPATIENT CLASS**  Functional Assessment Tool Used clinical judgement  Functional Limitation Mobility: Walking and moving around  Mobility: Walking and Moving Around Current Status (Z6109) CI  Mobility: Walking and Moving Around Goal Status (U0454) CI  Mobility: Walking and Moving Around Discharge Status (U9811) CI  PT General Charges  $$ ACUTE PT VISIT 1 Procedure  PT Evaluation  $Initial PT Evaluation Tier I 1 Procedure  PT Treatments  $Gait Training 8-22 mins   05/11/2014  [place] Bing, PT [phone]  (pager)

## 2014-06-20 DIAGNOSIS — N410 Acute prostatitis: Secondary | ICD-10-CM | POA: Insufficient documentation

## 2014-06-20 DIAGNOSIS — R3915 Urgency of urination: Secondary | ICD-10-CM | POA: Insufficient documentation

## 2014-07-13 ENCOUNTER — Ambulatory Visit (INDEPENDENT_AMBULATORY_CARE_PROVIDER_SITE_OTHER): Payer: Medicare HMO | Admitting: Nurse Practitioner

## 2014-07-13 ENCOUNTER — Encounter: Payer: Self-pay | Admitting: Nurse Practitioner

## 2014-07-13 VITALS — BP 119/68 | HR 62 | Ht 70.0 in | Wt 207.5 lb

## 2014-07-13 DIAGNOSIS — G459 Transient cerebral ischemic attack, unspecified: Secondary | ICD-10-CM

## 2014-07-13 DIAGNOSIS — M47812 Spondylosis without myelopathy or radiculopathy, cervical region: Secondary | ICD-10-CM

## 2014-07-13 DIAGNOSIS — R202 Paresthesia of skin: Secondary | ICD-10-CM

## 2014-07-13 DIAGNOSIS — M542 Cervicalgia: Secondary | ICD-10-CM

## 2014-07-13 DIAGNOSIS — R200 Anesthesia of skin: Secondary | ICD-10-CM

## 2014-07-13 MED ORDER — CYCLOBENZAPRINE HCL 10 MG PO TABS: 5.0000 mg | ORAL_TABLET | Freq: Two times a day (BID) | ORAL | Status: DC | PRN

## 2014-07-13 NOTE — Patient Instructions (Addendum)
Continue clopidogrel 75 mg orally every day  for secondary stroke prevention and maintain strict control of hypertension with blood pressure goal below 130/90, diabetes with hemoglobin A1c goal below 6.5% and lipids with LDL cholesterol goal below 100 mg/dL.  They were counseled on what may be a reoccurrence of stroke symptoms vs. New symptoms, but when in doubt, told to go to ER.  I am referring [facility] on [address] for a steroid epidural injection to the neck. Someone will call you to schedule this appt.    I have refilled your Flexerill, take half tablet twice daily as needed.   Return for followup in 6 months, call earlier if necessary.

## 2014-07-13 NOTE — Progress Notes (Signed)
PATIENT: Justin ChimesGeorge F Wells DOB: 11/06/1950  REASON FOR VISIT: routine follow up for stroke HISTORY FROM: patient  HISTORY OF PRESENT ILLNESS: Justin Wells is an 63 y.o. male with a past medical history significant for HTN, hyperlipidemia, CAD s/p stent deployment x 3, MI, PAD s/p femoral stenting, TIA's, ischemic stroke in the setting of acute MI 2014, brought to [facility] ED by ambulance as a code stroke due to acute onset of right hemiparesis, right facial droop 12/27/2013 at 1100. He said that he was home sitting in a couch when started feeling weak all over, developed severe dizziness " like room spinning" and then ambulance was called. According to patient and EMS, weakness of the right arm-leg and face as well as chest pain developed while in the truck. No double vision, difficulty swallowing, slurred speech, language or vision impairment. Chest pain radiated to his shoulders and back. NIHSS 4, BUT WITH SOME INCONSISTENCIES (1 for sensory, 1 for right arm weakness, 2 for right arm weakness). CT brain showed a subtle decreased attenuation in the lower right mid brain and upper right pons, concerning for acute ischemia in this area. CT angio head and neck showed atherosclerotic changes but no high grade stenosis.  Patient underwent CTA chest that showed no evidence of dissection or PE. He takes plavix daily. Patient was administered TPA; it was delayed due to CP and concern for aortic dissection/PE. He was admitted to the neuro ICU for further evaluation and treatment.  Imaging at that time did not confirm acute infarct. He was believed to have a left brain posterior circulation small infarct not visualized on MRI.   Patient returned to hospital on 01/14/14 after noting a sudden onset of posterior neck discomfort, vertigo, blurred vision and bilateral LE weakness.  EMS arrived at house and transported him to [facility] as a Code stroke. On arrival patient states he did have history of migraine HA which are very  similar in nature with HA, blurred vision and bilateral LE weakness. Initial CT head was negative.  MRI was negative for acute infarct.  He was diagnosed with Complicated Migraine and his Gabapentin was started at 300 mg tid as a preventative for Migraine.  Since discharge in April, he has right-sided weakness and numbness in the right hand and foot.  He states his hearing is worse in his right ear.  He has continued to have spells where he feels hot around his head and neck, will have sharp pain in the neck, with or without headache, and feels weak in his legs. During these episodes wife states he is slow to respond.  His wife states that when this happens, he must go lie down for several hours.  These episodes are occuring every 2-3 days.  He continues to take Plavix and aspirin daily.  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  He is not driving.  Wife states he was ordered outpatient PT but he cannot afford to go.  Wife is on permanent disability.  Blood pressure is well controlled. Hgba1c 5.4. LDL 85  Urgent Revisit 04/08/2014 (PS): Patient is seen today urgently following repeat hospital admission last month. He presented with right arm weakness, chest pain, tremors and became diaphoretic. Code stroke was called the patient's neurological exam had functional features. CT head was unremarkable. Subsequent MRI scan of the brain was obtained which showed no acute infarct. Patient complained of severe neck pain and right-sided numbness. He was asked to wear  a cervical collar. He was seen by Dr. Roda ShuttersXu and thought to have anxiety panic attacks. He was started on clonazepam 0.25 mg twice daily and Topamax 50 mg daily. Patient feels that he still has neck pain when he moves his neck and feels better after wearing the neck collar. He still has some trouble walking and uses a cane and feels that his right side of the body is numb. He had CT angiogram of the chest, abdomen and pelvis during the hospitalization  which showed no significant abnormalities as well. He outpatient EEG done on 6/9/5 which was read by me and normal.  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 He found no relief with Topamax and discontinued. He continues to have severe neck pain, worse when turning head towards the left side. Pain is affecting his quality of life. He continues to have left sided numbness, and is using a cane at all times.  REVIEW OF SYSTEMS: Full 14 system review of systems performed and notable only for: hearing loss, drooling, flushing, dizziness, headache, numbness, weakness, back pain, neck pain and stiffness, anxiety, agitation.  ALLERGIES: No Known Allergies  HOME MEDICATIONS: Outpatient Prescriptions Prior to Visit  Medication Sig Dispense Refill  . aspirin EC 81 MG tablet Take 81 mg by mouth daily.    Marland Kitchen. atorvastatin (LIPITOR) 80 MG tablet Take 1 tablet (80 mg total) by mouth daily at 6 (six) AM. 30 tablet 0  . clonazePAM (KLONOPIN) 0.5 MG tablet Take 0.5 tablets (0.25 mg total) by mouth 2 (two) times daily. 60 tablet 0  . clopidogrel (PLAVIX) 75 MG tablet TAKE ONE TABLET BY MOUTH ONCE DAILY 30 tablet 6  . fish oil-omega-3 fatty acids 1000 MG capsule Take 1 g by mouth 2 (two) times daily.     Marland Kitchen. lisinopril (PRINIVIL,ZESTRIL) 5 MG tablet Take 1 tablet (5 mg total) by mouth daily. 30 tablet 1  . Multiple Vitamins-Minerals (ONE-A-DAY MENS VITACRAVES PO) Take 1 tablet by mouth daily.     . nitroGLYCERIN (NITROSTAT) 0.4 MG SL tablet Place 0.4 mg under the tongue every 5 (five) minutes as needed for chest pain.    Marland Kitchen. PARoxetine (PAXIL) 20 MG tablet Take 1 tablet (20 mg total) by mouth every morning. 30 tablet 1  . topiramate (TOPAMAX) 50 MG tablet Take 1 tablet (50 mg total) by mouth 2 (two) times daily. 60 tablet 2  . oxyCODONE-acetaminophen (PERCOCET/ROXICET) 5-325 MG per tablet Take 1  tablet by mouth every 6 (six) hours as needed for moderate pain.    . cyclobenzaprine (FLEXERIL) 10 MG tablet Take 5 mg by mouth 2 (two) times daily as needed for muscle spasms.     . ranitidine (ZANTAC) 150 MG tablet Take 1 tablet (150 mg total) by mouth 2 (two) times daily. 60 tablet 1   Meds ordered this encounter  Medications  . esomeprazole (NEXIUM) 40 MG capsule    Sig: Take 40 mg by mouth.  . Oxycodone HCl 10 MG TABS    Sig: 1/2-1 PO Q 6 HR PRN SEVERE PAIN    PHYSICAL EXAM Filed Vitals:   07/13/14 1018  BP: 119/68  Pulse: 62  Height: 5\' 10"  (1.778 m)  Weight: 207 lb 8 oz (94.121 kg)   Body mass index is 29.77 kg/(m^2).  Generalized: Well developed middle aged Caucasian male, in no acute distress   Head: normocephalic  and atraumatic.    Neck: Supple, no carotid bruits   Cardiac: Regular rate rhythm, no murmur   Musculoskeletal: No deformity, limited ROM with flexion and extension in neck  Neurological examination   Mentation: Alert oriented to time, place, history taking. Follows all commands speech and language fluent Cranial nerve II-XII:  Pupils were equal round reactive to light extraocular movements were full, visual field were full on confrontational test. Facial sensation and strength were normal, except mild right lower facial weakness.  Hearing was decreased to finger rubbing on the right. Uvula tongue midline. Head turning  Limited by pain.  Shoulder shrug decreased on the right.  Tongue protrusion into cheek strength was normal. Motor: The motor testing reveals 5/5 strength of left-sided extremities. Right upper extremity 3/5 with give away effort. Right lower extremity 4/5 strength.  Good symmetric motor tone is noted throughout. Moderate weakness of the right grip and intrinsic hand muscles but poor effort. Sensory: Sensory testing is decreased to soft touch, and pinprick  And vibration on right-hemibody with splitting the midline including the forehead     Coordination: Cerebellar testing reveals good finger-nose-finger and heel-to-shin bilaterally.   Gait and station: Gait is antalgic. Tandem gait is unsteady. Romberg is negative.  Reflexes: Deep tendon reflexes are symmetric and normal bilaterally.   NIHSS: 2 MRs: 1  ASSESSMENT: Mr. Justin ChimesGeorge F Wells is a 63 y.o. male presenting with acute onset right hemiparesis on 12/27/13, status post IV t-PA. Imaging confirmed no acute infarct. Dx: left brain posterior circulation small infarct not visualized on MRI versus TIA. Given history of sweating episodes w/ neuro changes that last 24h, concern for posterior circulation TIAs in past. Review of his old records reveal similar episode in 2009 with MRA of the neck showing no significant extracranial stenosis. Recent hospital admission for neck pain and right-sided weakness numbness in the setting of tremors and anxiety and panic with negative imaging studies.  He continues to have episodes that may be complicated migraine vs. TIAs.  I have advised him to stop Gabapentin and Topamax as these have been no benefit.  [facility] for epidural spinal injection to the cervical spine to try to help alleviate some of his pain. Continue clopidogrel 75 mg orally every day  for secondary stroke prevention and maintain strict control of hypertension with blood pressure goal below 130/90, diabetes with hemoglobin A1c goal below 6.5% and lipids with LDL cholesterol goal below 100 mg/dL.  They were counseled on what may be a reoccurrence of stroke symptoms vs. New symptoms, but when in doubt, told to go to ER. Return for followup in 6 months, call earlier if necessary.  Tawny AsalLYNN E. LAM, MSN, FNP-BC, A/GNP-C 07/13/2014, 10:40 AM [facility] [address], [address] [place], [place] [address] [phone]  Note: This document was prepared with digital dictation and possible smart phrase technology. Any transcriptional errors that result from  this process are unintentional.

## 2014-07-18 NOTE — Progress Notes (Signed)
I agree with the above plan 

## 2014-07-20 ENCOUNTER — Telehealth: Payer: Self-pay | Admitting: Nurse Practitioner

## 2014-07-20 ENCOUNTER — Other Ambulatory Visit: Payer: Self-pay | Admitting: Neurology

## 2014-07-20 DIAGNOSIS — R29898 Other symptoms and signs involving the musculoskeletal system: Secondary | ICD-10-CM

## 2014-07-20 DIAGNOSIS — M542 Cervicalgia: Secondary | ICD-10-CM

## 2014-07-20 NOTE — Telephone Encounter (Signed)
Jeannie with [facility] @ [phone], questioning if patient needs to discontinue clopidogrel (PLAVIX) 75 MG tablet prior to diagnostic Radiology.  Please call and advise.

## 2014-07-20 NOTE — Telephone Encounter (Signed)
Patient's spouse stated they hadn't heard from [facility] regarding Interventional Radiology.  Spouse was instructed to inform LL, NP if they hadn't heard anytime by Monday 11/16.  Please call home # [phone] and if not available call mobile [phone].

## 2014-07-20 NOTE — Telephone Encounter (Signed)
Spoke with patient and his wife, informed them that I routed the referral again to GI, also gave patient the phone number to GI to call them and schedule appointment if they didn't want to wait for the call from GI.

## 2014-07-20 NOTE — Telephone Encounter (Signed)
Clearance needed for patient to go off Plavix for procedure

## 2014-07-20 NOTE — Telephone Encounter (Signed)
Patient may go off Plavix as needed for procedure and restart asap after procedure. If they need a letter please type one up and I will sign. Thanks.

## 2014-07-21 ENCOUNTER — Encounter: Payer: Self-pay | Admitting: *Deleted

## 2014-07-21 NOTE — Telephone Encounter (Signed)
Clearance letter faxed to [facility] at [phone].

## 2014-08-01 ENCOUNTER — Ambulatory Visit
Admission: RE | Admit: 2014-08-01 | Discharge: 2014-08-01 | Disposition: A | Discharge status: Home / Self Care | Payer: Medicare HMO | Source: Ambulatory Visit | Attending: Neurology | Admitting: Neurology

## 2014-08-01 DIAGNOSIS — M542 Cervicalgia: Secondary | ICD-10-CM

## 2014-08-01 DIAGNOSIS — R29898 Other symptoms and signs involving the musculoskeletal system: Secondary | ICD-10-CM

## 2014-08-01 MED ORDER — TRIAMCINOLONE ACETONIDE 40 MG/ML IJ SUSP (RADIOLOGY)
60.0000 mg | Freq: Once | INTRAMUSCULAR | Status: AC
  Administered 2014-08-01: 60 mg via EPIDURAL

## 2014-08-01 MED ORDER — IOHEXOL 300 MG/ML  SOLN
1.0000 mL | Freq: Once | INTRAMUSCULAR | Status: AC | PRN
  Administered 2014-08-01: 1 mL via EPIDURAL

## 2014-08-01 NOTE — Discharge Instructions (Signed)

## 2014-08-11 ENCOUNTER — Encounter (HOSPITAL_COMMUNITY): Payer: Self-pay | Admitting: Surgery

## 2014-08-21 ENCOUNTER — Emergency Department (HOSPITAL_COMMUNITY): Payer: Medicare HMO

## 2014-08-21 ENCOUNTER — Encounter (HOSPITAL_COMMUNITY): Payer: Self-pay

## 2014-08-21 ENCOUNTER — Emergency Department (HOSPITAL_COMMUNITY)
Admission: EM | Admit: 2014-08-21 | Discharge: 2014-08-21 | Disposition: A | Discharge status: Home / Self Care | Payer: Medicare HMO | Attending: Emergency Medicine | Admitting: Emergency Medicine

## 2014-08-21 DIAGNOSIS — K219 Gastro-esophageal reflux disease without esophagitis: Secondary | ICD-10-CM | POA: Diagnosis not present

## 2014-08-21 DIAGNOSIS — Z87891 Personal history of nicotine dependence: Secondary | ICD-10-CM | POA: Insufficient documentation

## 2014-08-21 DIAGNOSIS — R519 Headache, unspecified: Secondary | ICD-10-CM

## 2014-08-21 DIAGNOSIS — G43009 Migraine without aura, not intractable, without status migrainosus: Secondary | ICD-10-CM | POA: Diagnosis not present

## 2014-08-21 DIAGNOSIS — I639 Cerebral infarction, unspecified: Secondary | ICD-10-CM | POA: Insufficient documentation

## 2014-08-21 DIAGNOSIS — Z79899 Other long term (current) drug therapy: Secondary | ICD-10-CM | POA: Diagnosis not present

## 2014-08-21 DIAGNOSIS — I252 Old myocardial infarction: Secondary | ICD-10-CM | POA: Diagnosis not present

## 2014-08-21 DIAGNOSIS — R51 Headache: Secondary | ICD-10-CM

## 2014-08-21 DIAGNOSIS — I69359 Hemiplegia and hemiparesis following cerebral infarction affecting unspecified side: Secondary | ICD-10-CM

## 2014-08-21 DIAGNOSIS — G8194 Hemiplegia, unspecified affecting left nondominant side: Secondary | ICD-10-CM | POA: Diagnosis not present

## 2014-08-21 DIAGNOSIS — M542 Cervicalgia: Secondary | ICD-10-CM | POA: Diagnosis not present

## 2014-08-21 DIAGNOSIS — I10 Essential (primary) hypertension: Secondary | ICD-10-CM | POA: Insufficient documentation

## 2014-08-21 DIAGNOSIS — G8929 Other chronic pain: Secondary | ICD-10-CM

## 2014-08-21 DIAGNOSIS — Z7982 Long term (current) use of aspirin: Secondary | ICD-10-CM | POA: Insufficient documentation

## 2014-08-21 DIAGNOSIS — E785 Hyperlipidemia, unspecified: Secondary | ICD-10-CM | POA: Diagnosis not present

## 2014-08-21 DIAGNOSIS — R42 Dizziness and giddiness: Secondary | ICD-10-CM | POA: Diagnosis present

## 2014-08-21 LAB — CBC WITH DIFFERENTIAL/PLATELET
Basophils Absolute: 0 10*3/uL (ref 0.0–0.1)
Basophils Relative: 0 % (ref 0–1)
Eosinophils Absolute: 0.1 10*3/uL (ref 0.0–0.7)
Eosinophils Relative: 2 % (ref 0–5)
HEMATOCRIT: 39.1 % (ref 39.0–52.0)
HEMOGLOBIN: 13.4 g/dL (ref 13.0–17.0)
LYMPHS ABS: 1 10*3/uL (ref 0.7–4.0)
LYMPHS PCT: 12 % (ref 12–46)
MCH: 29.1 pg (ref 26.0–34.0)
MCHC: 34.3 g/dL (ref 30.0–36.0)
MCV: 85 fL (ref 78.0–100.0)
MONO ABS: 0.7 10*3/uL (ref 0.1–1.0)
MONOS PCT: 9 % (ref 3–12)
NEUTROS ABS: 6.1 10*3/uL (ref 1.7–7.7)
NEUTROS PCT: 77 % (ref 43–77)
Platelets: 155 10*3/uL (ref 150–400)
RBC: 4.6 MIL/uL (ref 4.22–5.81)
RDW: 14.6 % (ref 11.5–15.5)
WBC: 7.9 10*3/uL (ref 4.0–10.5)

## 2014-08-21 LAB — BASIC METABOLIC PANEL
ANION GAP: 13 (ref 5–15)
BUN: 12 mg/dL (ref 6–23)
CHLORIDE: 101 meq/L (ref 96–112)
CO2: 23 meq/L (ref 19–32)
Calcium: 8.3 mg/dL — ABNORMAL LOW (ref 8.4–10.5)
Creatinine, Ser: 0.91 mg/dL (ref 0.50–1.35)
GFR calc Af Amer: >90 mL/min (ref >90)
GFR calc non Af Amer: 90 mL/min — ABNORMAL LOW (ref >90)
Glucose, Bld: 97 mg/dL (ref 70–99)
POTASSIUM: 4.2 meq/L (ref 3.7–5.3)
Sodium: 137 mEq/L (ref 137–147)

## 2014-08-21 LAB — TROPONIN I: Troponin I: <0.3 ng/mL (ref <0.30)

## 2014-08-21 MED ORDER — DIPHENHYDRAMINE HCL 50 MG/ML IJ SOLN
25.0000 mg | Freq: Once | INTRAMUSCULAR | Status: AC
  Administered 2014-08-21: 25 mg via INTRAVENOUS
  Filled 2014-08-21: qty 1

## 2014-08-21 MED ORDER — METOCLOPRAMIDE HCL 5 MG/ML IJ SOLN
10.0000 mg | Freq: Once | INTRAMUSCULAR | Status: AC
  Administered 2014-08-21: 10 mg via INTRAVENOUS
  Filled 2014-08-21: qty 2

## 2014-08-21 MED ORDER — HYDROMORPHONE HCL 1 MG/ML IJ SOLN
1.0000 mg | Freq: Once | INTRAMUSCULAR | Status: AC
  Administered 2014-08-21: 1 mg via INTRAVENOUS
  Filled 2014-08-21: qty 1

## 2014-08-21 MED ORDER — ONDANSETRON HCL 4 MG/2ML IJ SOLN
4.0000 mg | Freq: Once | INTRAMUSCULAR | Status: AC
  Administered 2014-08-21: 4 mg via INTRAVENOUS
  Filled 2014-08-21: qty 2

## 2014-08-21 MED ORDER — SODIUM CHLORIDE 0.9 % IV SOLN
INTRAVENOUS | Status: DC
  Administered 2014-08-21: 18:00:00 via INTRAVENOUS

## 2014-08-21 NOTE — ED Notes (Signed)
To room via EMS.  Pt reports since stroke April 2015 he gets dizzy and sweaty spells twice a week, lasts 1 to 4 hours.  Pt has "blockage" on left posterior neck since stroke that he takes pain meds for.  Right sided weakness since stroke, uses cane when ambulating.  Pt still dizzy at this time.  Pain in neck 10/10. Last took Percocet 12pm.  A&O x 4.

## 2014-08-21 NOTE — ED Provider Notes (Signed)
CSN: [id number]     Arrival date & time 08/21/14  1658 History   First MD Initiated Contact with Patient 08/21/14 1704     Chief Complaint  Patient presents with  . Dizziness     (Consider location/radiation/quality/duration/timing/severity/associated sxs/prior Treatment) HPI The patient reports he got a sudden and severe pain at the back of his head and down the left side of his neck. He reports he became severely dizzy with it as well. He describes the pain as being a 10 out of 10. It is sharp and lancinating. The patient has had a stroke with baseline right-sided weakness. He denies that any new motor dysfunction developed in association with this headache. He also denies that there was any acute visual change. The patient reports he does feel nauseated but has not vomited. He reports the pain comes up the back of his head and around his ear to his temple. The patient reports he has had problems with headaches in the past but not this severe. He reports with this when he became clammy and very flushed. His wife reports that once before he had a fairly severe headache however typically he does not complain of headaches and only has one of any severity with infrequency. He had not had fever or chills preceding this. He denies that he had any generalized headache. He reports that he felt too dizzy to get up or walk. The dizziness as a vertiginous quality to it. Past Medical History  Diagnosis Date  . Peripheral arterial disease   . Hyperlipidemia   . Coronary artery disease   . Hypertension   . Barrett's esophagus   . Gastroesophageal reflux   . Cerebrovascular disease   . Myocardial infarction 2009    "during cardiac cath" (01/27/2013)  . H/O hiatal hernia   . JXBJYNWG(956.2)     "probably monthly" (01/27/2013)  . Seizures     "when I had my heart attack in 2009" (01/27/2013)  . Stroke 2009  2010    "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, weak right leg and hand; I've had 2 or 3  strokes; can't remember the dates" (01/27/2013)  . Arthritis     "hands" (01/27/2013)   Past Surgical History  Procedure Laterality Date  . Laparoscopic gastric banding  2010  . Cardiac catheterization    . Coronary angioplasty with stent placement  11/02/2007    "2" (01/27/2013)  . Laparoscopic nissen fundoplication  ?2001  . Femoral artery stent Right 01/26/2013  . Diagnostic laparoscopy      "twice after nissen; it kept coming apart " (01/27/2013)  . Abdominal aortagram N/A 01/26/2013    Procedure: ABDOMINAL Ronny Flurry;  Surgeon: Nada Libman, MD;  Location: [facility] CATH LAB;  Service: Cardiovascular;  Laterality: N/A;  . Lower extremity angiogram Left 03/02/2013    Procedure: LOWER EXTREMITY ANGIOGRAM;  Surgeon: Nada Libman, MD;  Location: [facility] CATH LAB;  Service: Cardiovascular;  Laterality: Left;  . Left heart catheterization with coronary angiogram N/A 03/09/2014    Procedure: LEFT HEART CATHETERIZATION WITH CORONARY ANGIOGRAM;  Surgeon: Iran Ouch, MD;  Location: [facility] CATH LAB;  Service: Cardiovascular;  Laterality: N/A;   Family History  Problem Relation Age of Onset  . Heart disease Mother     Heart Disease before age 31  . Hypertension Mother   . Heart attack Mother   . Heart attack Father    History  Substance Use Topics  . Smoking status: Former Smoker -- 2.00 packs/day  for 35 years    Types: Cigarettes    Quit date: 01/28/2001  . Smokeless tobacco: Never Used  . Alcohol Use: No     Comment: 01/27/2013 "quit drinking in 2001; I'm a recovering alcoholic"    Review of Systems  10 Systems reviewed and are negative for acute change except as noted in the HPI.   Allergies  Review of patient's allergies indicates no known allergies.  Home Medications   Prior to Admission medications   Medication Sig Start Date End Date Taking? Authorizing Provider  aspirin EC 81 MG tablet Take 81 mg by mouth daily.   Yes Historical Provider, MD  atorvastatin (LIPITOR) 80 MG tablet  Take 1 tablet (80 mg total) by mouth daily at 6 (six) AM. 03/10/14  Yes Adeline C Viyuoh, MD  clonazePAM (KLONOPIN) 0.5 MG tablet Take 0.5 tablets (0.25 mg total) by mouth 2 (two) times daily. 03/10/14  Yes Adeline Joselyn Glassman Viyuoh, MD  clopidogrel (PLAVIX) 75 MG tablet TAKE ONE TABLET BY MOUTH ONCE DAILY 05/04/14  Yes Nada LibmanVance W Brabham, MD  cyclobenzaprine (FLEXERIL) 10 MG tablet Take 0.5 tablets (5 mg total) by mouth 2 (two) times daily as needed for muscle spasms. 07/13/14  Yes Ronal FearLynn E Lam, NP  esomeprazole (NEXIUM) 40 MG capsule Take 40 mg by mouth. 07/01/14 07/01/15 Yes Historical Provider, MD  fish oil-omega-3 fatty acids 1000 MG capsule Take 1 g by mouth 2 (two) times daily.    Yes Historical Provider, MD  lisinopril (PRINIVIL,ZESTRIL) 5 MG tablet Take 1 tablet (5 mg total) by mouth daily. 12/31/13  Yes Daniel J Angiulli, PA-C  Multiple Vitamins-Minerals (ONE-A-DAY MENS VITACRAVES PO) Take 1 tablet by mouth daily.    Yes Historical Provider, MD  nitroGLYCERIN (NITROSTAT) 0.4 MG SL tablet Place 0.4 mg under the tongue every 5 (five) minutes as needed for chest pain.   Yes Historical Provider, MD  Oxycodone HCl 10 MG TABS 1/2-1 PO Q 6 HR PRN SEVERE PAIN 06/20/14  Yes Historical Provider, MD  Oxycodone HCl 10 MG TABS Take 5 mg by mouth every 6 (six) hours.   Yes Historical Provider, MD  PARoxetine (PAXIL) 20 MG tablet Take 1 tablet (20 mg total) by mouth every morning. 12/31/13  Yes Daniel J Angiulli, PA-C   BP 124/74 mmHg  Pulse 64  Temp(Src) 97.8 F (36.6 C) (Oral)  Resp 13  SpO2 95% Physical Exam  Constitutional: He is oriented to person, place, and time. He appears well-developed and well-nourished.  HENT:  Head: Normocephalic and atraumatic.  Eyes: EOM are normal. Pupils are equal, round, and reactive to light.  Neck: Neck supple.  Cardiovascular: Normal rate, regular rhythm, normal heart sounds and intact distal pulses.   Pulmonary/Chest: Effort normal and breath sounds normal.  Abdominal: Soft.  Bowel sounds are normal. He exhibits no distension. There is no tenderness.  Musculoskeletal: Normal range of motion. He exhibits no edema.  Neurological: He is alert and oriented to person, place, and time. He has normal strength. Coordination abnormal. GCS eye subscore is 4. GCS verbal subscore is 5. GCS motor subscore is 6.  The patient has right sided weakness. He endorses his to be at his baseline. His motor testing on the right relative to the left is about  2 / 5. Cranial nerves are intact.  Skin: Skin is warm, dry and intact.  The patient's face is flushed in appearance. He does not have other rash.  Psychiatric:  Patient is mildly anxious.    ED Course  Procedures (including critical care time) Labs Review Labs Reviewed  CBC WITH DIFFERENTIAL  BASIC METABOLIC PANEL  TROPONIN I    Imaging Review Ct Head Wo Contrast  08/21/2014   CLINICAL DATA:  Dizziness. History of CVA and TIA. Initial encounter.  EXAM: CT HEAD WITHOUT CONTRAST  TECHNIQUE: Contiguous axial images were obtained from the base of the skull through the vertex without intravenous contrast.  COMPARISON:  Head CT 03/07/2014.  FINDINGS: There is no evidence of acute intracranial hemorrhage, mass lesion, brain edema or extra-axial fluid collection. The ventricles and subarachnoid spaces are appropriately sized for age. There is no CT evidence of acute cortical infarction. Intracranial vascular calcifications are noted.  The visualized paranasal sinuses, mastoid air cells and middle ears are clear. The calvarium is intact. There is stable chronic deformity of the left lamina papyracea.  IMPRESSION: Stable examination.  No acute intracranial findings.   Electronically Signed   By: Roxy HorsemanBill  Veazey M.D.   On: 08/21/2014 18:49     EKG Interpretation   Date/Time:  "Sunday August 21 2014 17:08:32 EST Ventricular Rate:  72 PR Interval:  145 QRS Duration: 93 QT Interval:  399 QTC Calculation: 437 R Axis:   8 Text  Interpretation:  Sinus rhythm RSR' in V1 or V2, right VCD or RVH  agree. no change from old. Confirmed by Janari Yamada, MD, Alekai Pocock ([id number]) on  08/21/2014 7:49:32 PM     20" :00 the patient had not gotten improvement with morphine. He did however get significant improvement with Benadryl and Reglan. I have had another discussion with his wife. She reports this is consistent with prior episodes of headache and cervical pain. She reports he had an MRI within the past 4-6 weeks for similar pain quality. She reports that he has had cervical steroid injections for pain control. She reports he sees Dr. Pearlean BrownieSethi for similar pain episodes. She reports typically she would not of had him come in for this type of an event due to the frequency with which she manages these types of things at home. She reports however she does have concern for his increased stroke risk that she knows will occur at some point per her discussions with Dr. Pearlean BrownieSethi and when the symptoms are particularly severe it is more worrisome. MDM   Final diagnoses:  Migraine without aura and without status migrainosus, not intractable  Chronic cervical pain  CVA, old, hemiparesis   At initial presentation the headache was described as being different and more severe than baseline. However after the patient was treated and improved he and his wife report that this has been a frequent occurrence for him and that he has had extensive neurologic workup with Dr. Pearlean BrownieSethi for similar episodes. The patient does not have any new neurologic deficits. His pain was most improved by treatment for migraine with Benadryl and Reglan. At this time I do feel he is safe for discharge. Instructions on signs and symptoms for which to return have been provided.   Arby BarretteMarcy Sufian Ravi, MD 08/21/14 2010

## 2014-08-21 NOTE — Discharge Instructions (Signed)
Possible Migraine Headache A migraine headache is an intense, throbbing pain on one or both sides of your head. A migraine can last for 30 minutes to several hours. CAUSES  The exact cause of a migraine headache is not always known. However, a migraine may be caused when nerves in the brain become irritated and release chemicals that cause inflammation. This causes pain. Certain things may also trigger migraines, such as:  Alcohol.  Smoking.  Stress.  Menstruation.  Aged cheeses.  Foods or drinks that contain nitrates, glutamate, aspartame, or tyramine.  Lack of sleep.  Chocolate.  Caffeine.  Hunger.  Physical exertion.  Fatigue.  Medicines used to treat chest pain (nitroglycerine), birth control pills, estrogen, and some blood pressure medicines. SIGNS AND SYMPTOMS  Pain on one or both sides of your head.  Pulsating or throbbing pain.  Severe pain that prevents daily activities.  Pain that is aggravated by any physical activity.  Nausea, vomiting, or both.  Dizziness.  Pain with exposure to bright lights, loud noises, or activity.  General sensitivity to bright lights, loud noises, or smells. Before you get a migraine, you may get warning signs that a migraine is coming (aura). An aura may include:  Seeing flashing lights.  Seeing bright spots, halos, or zigzag lines.  Having tunnel vision or blurred vision.  Having feelings of numbness or tingling.  Having trouble talking.  Having muscle weakness. DIAGNOSIS  A migraine headache is often diagnosed based on:  Symptoms.  Physical exam.  A CT scan or MRI of your head. These imaging tests cannot diagnose migraines, but they can help rule out other causes of headaches. TREATMENT Medicines may be given for pain and nausea. Medicines can also be given to help prevent recurrent migraines.  HOME CARE INSTRUCTIONS  Only take over-the-counter or prescription medicines for pain or discomfort as directed  by your health care provider. The use of long-term narcotics is not recommended.  Lie down in a dark, quiet room when you have a migraine.  Keep a journal to find out what may trigger your migraine headaches. For example, write down:  What you eat and drink.  How much sleep you get.  Any change to your diet or medicines.  Limit alcohol consumption.  Quit smoking if you smoke.  Get 7-9 hours of sleep, or as recommended by your health care provider.  Limit stress.  Keep lights dim if bright lights bother you and make your migraines worse. SEEK IMMEDIATE MEDICAL CARE IF:   Your migraine becomes severe.  You have a fever.  You have a stiff neck.  You have vision loss.  You have muscular weakness or loss of muscle control.  You start losing your balance or have trouble walking.  You feel faint or pass out.  You have severe symptoms that are different from your first symptoms. MAKE SURE YOU:   Understand these instructions.  Will watch your condition.  Will get help right away if you are not doing well or get worse. Document Released: 08/19/2005 Document Revised: 01/03/2014 Document Reviewed: 04/26/2013 [facility] Patient Information 2015 [employer] [place]. This information is not intended to replace advice given to you by your health care provider. Make sure you discuss any questions you have with your health care provider. Transient Ischemic Attack A transient ischemic attack (TIA) is a "warning stroke" that causes stroke-like symptoms. Unlike a stroke, a TIA does not cause permanent damage to the brain. The symptoms of a TIA can happen very fast and  do not last long. It is important to know the symptoms of a TIA and what to do. This can help prevent a major stroke or death. CAUSES   A TIA is caused by a temporary blockage in an artery in the brain or neck (carotid artery). The blockage does not allow the brain to get the blood supply it needs and can cause different  symptoms. The blockage can be caused by either:  A blood clot.  Fatty buildup (plaque) in a neck or brain artery. RISK FACTORS  High blood pressure (hypertension).  High cholesterol.  Diabetes mellitus.  Heart disease.  The build up of plaque in the blood vessels (peripheral artery disease or atherosclerosis).  The build up of plaque in the blood vessels providing blood and oxygen to the brain (carotid artery stenosis).  An abnormal heart rhythm (atrial fibrillation).  Obesity.  Smoking.  Taking oral contraceptives (especially in combination with smoking).  Physical inactivity.  A diet high in fats, salt (sodium), and calories.  Alcohol use.  Use of illegal drugs (especially cocaine and methamphetamine).  Being male.  Being African American.  Being over the age of 56.  Family history of stroke.  Previous history of blood clots, stroke, TIA, or heart attack.  Sickle cell disease. SYMPTOMS  TIA symptoms are the same as a stroke but are temporary. These symptoms usually develop suddenly, or may be newly present upon awakening from sleep:  Sudden weakness or numbness of the face, arm, or leg, especially on one side of the body.  Sudden trouble walking or difficulty moving arms or legs.  Sudden confusion.  Sudden personality changes.  Trouble speaking (aphasia) or understanding.  Difficulty swallowing.  Sudden trouble seeing in one or both eyes.  Double vision.  Dizziness.  Loss of balance or coordination.  Sudden severe headache with no known cause.  Trouble reading or writing.  Loss of bowel or bladder control.  Loss of consciousness. DIAGNOSIS  Your caregiver may be able to determine the presence or absence of a TIA based on your symptoms, history, and physical exam. Computed tomography (CT scan) of the brain is usually performed to help identify a TIA. Other tests may be done to diagnose a TIA. These tests may  include:  Electrocardiography.  Continuous heart monitoring.  Echocardiography.  Carotid ultrasonography.  Magnetic resonance imaging (MRI).  A scan of the brain circulation.  Blood tests. PREVENTION  The risk of a TIA can be decreased by appropriately treating high blood pressure, high cholesterol, diabetes, heart disease, and obesity and by quitting smoking, limiting alcohol, and staying physically active. TREATMENT  Time is of the essence. Since the symptoms of TIA are the same as a stroke, it is important to seek treatment as soon as possible because you may need a medicine to dissolve the clot (thrombolytic) that cannot be given if too much time has passed. Treatment options vary. Treatment options may include rest, oxygen, intravenous (IV) fluids, and medicines to thin the blood (anticoagulants). Medicines and diet may be used to address diabetes, high blood pressure, and other risk factors. Measures will be taken to prevent short-term and long-term complications, including infection from breathing foreign material into the lungs (aspiration pneumonia), blood clots in the legs, and falls. Treatment options include procedures to either remove plaque in the carotid arteries or dilate carotid arteries that have narrowed due to plaque. Those procedures are:  Carotid endarterectomy.  Carotid angioplasty and stenting. HOME CARE INSTRUCTIONS   Take all medicines prescribed  by your caregiver. Follow the directions carefully. Medicines may be used to control risk factors for a stroke. Be sure you understand all your medicine instructions.  You may be told to take aspirin or the anticoagulant warfarin. Warfarin needs to be taken exactly as instructed.  Taking too much or too little warfarin is dangerous. Too much warfarin increases the risk of bleeding. Too little warfarin continues to allow the risk for blood clots. While taking warfarin, you will need to have regular blood tests to measure  your blood clotting time. A PT blood test measures how long it takes for blood to clot. Your PT is used to calculate another value called an INR. Your PT and INR help your caregiver to adjust your dose of warfarin. The dose can change for many reasons. It is critically important that you take warfarin exactly as prescribed.  Many foods, especially foods high in vitamin K can interfere with warfarin and affect the PT and INR. Foods high in vitamin K include spinach, kale, broccoli, cabbage, collard and turnip greens, brussels sprouts, peas, cauliflower, seaweed, and parsley as well as beef and pork liver, green tea, and soybean oil. You should eat a consistent amount of foods high in vitamin K. Avoid major changes in your diet, or notify your caregiver before changing your diet. Arrange a visit with a dietitian to answer your questions.  Many medicines can interfere with warfarin and affect the PT and INR. You must tell your caregiver about any and all medicines you take, this includes all vitamins and supplements. Be especially cautious with aspirin and anti-inflammatory medicines. Do not take or discontinue any prescribed or over-the-counter medicine except on the advice of your caregiver or pharmacist.  Warfarin can have side effects, such as excessive bruising or bleeding. You will need to hold pressure over cuts for longer than usual. Your caregiver or pharmacist will discuss other potential side effects.  Avoid sports or activities that may cause injury or bleeding.  Be mindful when shaving, flossing your teeth, or handling sharp objects.  Alcohol can change the body's ability to handle warfarin. It is best to avoid alcoholic drinks or consume only very small amounts while taking warfarin. Notify your caregiver if you change your alcohol intake.  Notify your dentist or other caregivers before procedures.  Eat a diet that includes 5 or more servings of fruits and vegetables each day. This may  reduce the risk of stroke. Certain diets may be prescribed to address high blood pressure, high cholesterol, diabetes, or obesity.  A low-sodium, low-saturated fat, low-trans fat, low-cholesterol diet is recommended to manage high blood pressure.  A low-saturated fat, low-trans fat, low-cholesterol, and high-fiber diet may control cholesterol levels.  A controlled-carbohydrate, controlled-sugar diet is recommended to manage diabetes.  A reduced-calorie, low-sodium, low-saturated fat, low-trans fat, low-cholesterol diet is recommended to manage obesity.  Maintain a healthy weight.  Stay physically active. It is recommended that you get at least 30 minutes of activity on most or all days.  Do not smoke.  Limit alcohol use even if you are not taking warfarin. Moderate alcohol use is considered to be:  No more than 2 drinks each day for men.  No more than 1 drink each day for nonpregnant women.  Stop drug abuse.  Home safety. A safe home environment is important to reduce the risk of falls. Your caregiver may arrange for specialists to evaluate your home. Having grab bars in the bedroom and bathroom is often important. Your caregiver  may arrange for equipment to be used at home, such as raised toilets and a seat for the shower.  Follow all instructions for follow-up with your caregiver. This is very important. This includes any referrals and lab tests. Proper follow up can prevent a stroke or another TIA from occurring. SEEK MEDICAL CARE IF:  You have personality changes.  You have difficulty swallowing.  You are seeing double.  You have dizziness.  You have a fever.  You have skin breakdown. SEEK IMMEDIATE MEDICAL CARE IF:  Any of these symptoms may represent a serious problem that is an emergency. Do not wait to see if the symptoms will go away. Get medical help right away. Call your local emergency services (911 in [place] Do not drive yourself to the hospital.  You have  sudden weakness or numbness of the face, arm, or leg, especially on one side of the body.  You have sudden trouble walking or difficulty moving arms or legs.  You have sudden confusion.  You have trouble speaking (aphasia) or understanding.  You have sudden trouble seeing in one or both eyes.  You have a loss of balance or coordination.  You have a sudden, severe headache with no known cause.  You have new chest pain or an irregular heartbeat.  You have a partial or total loss of consciousness. MAKE SURE YOU:   Understand these instructions.  Will watch your condition.  Will get help right away if you are not doing well or get worse. Document Released: 05/29/2005 Document Revised: 08/24/2013 Document Reviewed: 11/24/2013 [facility] Patient Information 2015 [place], [place]. This information is not intended to replace advice given to you by your health care provider. Make sure you discuss any questions you have with your health care provider.

## 2014-12-06 ENCOUNTER — Other Ambulatory Visit: Payer: Self-pay | Admitting: Surgery

## 2015-01-11 ENCOUNTER — Ambulatory Visit: Payer: Medicare HMO | Admitting: Neurology

## 2015-01-12 ENCOUNTER — Encounter: Payer: Self-pay | Admitting: Neurology

## 2015-04-12 ENCOUNTER — Encounter (HOSPITAL_COMMUNITY): Payer: Self-pay | Admitting: Neurology

## 2015-04-12 ENCOUNTER — Inpatient Hospital Stay (HOSPITAL_COMMUNITY)
Admission: EM | Admit: 2015-04-12 | Discharge: 2015-04-13 | DRG: 101 | Disposition: A | Discharge status: Home / Self Care | Payer: Medicare HMO | Attending: Internal Medicine | Admitting: Internal Medicine

## 2015-04-12 ENCOUNTER — Emergency Department (HOSPITAL_COMMUNITY): Payer: Medicare HMO

## 2015-04-12 DIAGNOSIS — K219 Gastro-esophageal reflux disease without esophagitis: Secondary | ICD-10-CM | POA: Diagnosis present

## 2015-04-12 DIAGNOSIS — R401 Stupor: Secondary | ICD-10-CM

## 2015-04-12 DIAGNOSIS — I739 Peripheral vascular disease, unspecified: Secondary | ICD-10-CM | POA: Diagnosis present

## 2015-04-12 DIAGNOSIS — Z8673 Personal history of transient ischemic attack (TIA), and cerebral infarction without residual deficits: Secondary | ICD-10-CM | POA: Diagnosis not present

## 2015-04-12 DIAGNOSIS — Z7982 Long term (current) use of aspirin: Secondary | ICD-10-CM

## 2015-04-12 DIAGNOSIS — M13842 Other specified arthritis, left hand: Secondary | ICD-10-CM | POA: Diagnosis present

## 2015-04-12 DIAGNOSIS — Z7902 Long term (current) use of antithrombotics/antiplatelets: Secondary | ICD-10-CM

## 2015-04-12 DIAGNOSIS — I2510 Atherosclerotic heart disease of native coronary artery without angina pectoris: Secondary | ICD-10-CM | POA: Diagnosis present

## 2015-04-12 DIAGNOSIS — Z87891 Personal history of nicotine dependence: Secondary | ICD-10-CM | POA: Diagnosis not present

## 2015-04-12 DIAGNOSIS — I252 Old myocardial infarction: Secondary | ICD-10-CM | POA: Diagnosis not present

## 2015-04-12 DIAGNOSIS — Z9884 Bariatric surgery status: Secondary | ICD-10-CM

## 2015-04-12 DIAGNOSIS — G894 Chronic pain syndrome: Secondary | ICD-10-CM | POA: Diagnosis present

## 2015-04-12 DIAGNOSIS — F411 Generalized anxiety disorder: Secondary | ICD-10-CM | POA: Diagnosis present

## 2015-04-12 DIAGNOSIS — R569 Unspecified convulsions: Secondary | ICD-10-CM

## 2015-04-12 DIAGNOSIS — E785 Hyperlipidemia, unspecified: Secondary | ICD-10-CM | POA: Diagnosis present

## 2015-04-12 DIAGNOSIS — G40909 Epilepsy, unspecified, not intractable, without status epilepticus: Secondary | ICD-10-CM | POA: Diagnosis not present

## 2015-04-12 DIAGNOSIS — Z955 Presence of coronary angioplasty implant and graft: Secondary | ICD-10-CM

## 2015-04-12 DIAGNOSIS — M13841 Other specified arthritis, right hand: Secondary | ICD-10-CM | POA: Diagnosis present

## 2015-04-12 DIAGNOSIS — Z8249 Family history of ischemic heart disease and other diseases of the circulatory system: Secondary | ICD-10-CM

## 2015-04-12 DIAGNOSIS — R4189 Other symptoms and signs involving cognitive functions and awareness: Secondary | ICD-10-CM | POA: Diagnosis present

## 2015-04-12 DIAGNOSIS — Z79899 Other long term (current) drug therapy: Secondary | ICD-10-CM | POA: Diagnosis not present

## 2015-04-12 DIAGNOSIS — M542 Cervicalgia: Secondary | ICD-10-CM | POA: Diagnosis present

## 2015-04-12 DIAGNOSIS — I10 Essential (primary) hypertension: Secondary | ICD-10-CM | POA: Diagnosis present

## 2015-04-12 DIAGNOSIS — M25519 Pain in unspecified shoulder: Secondary | ICD-10-CM | POA: Diagnosis present

## 2015-04-12 DIAGNOSIS — K2270 Barrett's esophagus without dysplasia: Secondary | ICD-10-CM | POA: Diagnosis present

## 2015-04-12 DIAGNOSIS — Z79891 Long term (current) use of opiate analgesic: Secondary | ICD-10-CM | POA: Diagnosis not present

## 2015-04-12 DIAGNOSIS — Z95820 Peripheral vascular angioplasty status with implants and grafts: Secondary | ICD-10-CM | POA: Diagnosis not present

## 2015-04-12 DIAGNOSIS — F419 Anxiety disorder, unspecified: Secondary | ICD-10-CM | POA: Diagnosis present

## 2015-04-12 DIAGNOSIS — R4182 Altered mental status, unspecified: Secondary | ICD-10-CM

## 2015-04-12 LAB — I-STAT CHEM 8, ED
BUN: 9 mg/dL (ref 6–20)
Calcium, Ion: 1.09 mmol/L — ABNORMAL LOW (ref 1.13–1.30)
Chloride: 100 mmol/L — ABNORMAL LOW (ref 101–111)
Creatinine, Ser: 1 mg/dL (ref 0.61–1.24)
Glucose, Bld: 77 mg/dL (ref 65–99)
HCT: 40 % (ref 39.0–52.0)
Hemoglobin: 13.6 g/dL (ref 13.0–17.0)
Potassium: 3.8 mmol/L (ref 3.5–5.1)
Sodium: 136 mmol/L (ref 135–145)
TCO2: 26 mmol/L (ref 0–100)

## 2015-04-12 LAB — CBC WITH DIFFERENTIAL/PLATELET
Basophils Absolute: 0.1 10*3/uL (ref 0.0–0.1)
Basophils Relative: 1 % (ref 0–1)
Eosinophils Absolute: 0.3 10*3/uL (ref 0.0–0.7)
Eosinophils Relative: 3 % (ref 0–5)
HCT: 39.4 % (ref 39.0–52.0)
Hemoglobin: 13.9 g/dL (ref 13.0–17.0)
Lymphocytes Relative: 11 % — ABNORMAL LOW (ref 12–46)
Lymphs Abs: 1.1 10*3/uL (ref 0.7–4.0)
MCH: 30.2 pg (ref 26.0–34.0)
MCHC: 35.3 g/dL (ref 30.0–36.0)
MCV: 85.5 fL (ref 78.0–100.0)
Monocytes Absolute: 1 10*3/uL (ref 0.1–1.0)
Monocytes Relative: 10 % (ref 3–12)
Neutro Abs: 7.7 10*3/uL (ref 1.7–7.7)
Neutrophils Relative %: 75 % (ref 43–77)
Platelets: 182 10*3/uL (ref 150–400)
RBC: 4.61 MIL/uL (ref 4.22–5.81)
RDW: 13.6 % (ref 11.5–15.5)
WBC: 10.2 10*3/uL (ref 4.0–10.5)

## 2015-04-12 LAB — URINALYSIS, ROUTINE W REFLEX MICROSCOPIC
Bilirubin Urine: NEGATIVE
GLUCOSE, UA: NEGATIVE mg/dL
Hgb urine dipstick: NEGATIVE
Ketones, ur: NEGATIVE mg/dL
Leukocytes, UA: NEGATIVE
Nitrite: NEGATIVE
Protein, ur: NEGATIVE mg/dL
SPECIFIC GRAVITY, URINE: 1.011 (ref 1.005–1.030)
UROBILINOGEN UA: 2 mg/dL — AB (ref 0.0–1.0)
pH: 6 (ref 5.0–8.0)

## 2015-04-12 LAB — RAPID URINE DRUG SCREEN, HOSP PERFORMED
Amphetamines: NOT DETECTED
Barbiturates: NOT DETECTED
Benzodiazepines: NOT DETECTED
Cocaine: NOT DETECTED
OPIATES: POSITIVE — AB
Tetrahydrocannabinol: NOT DETECTED

## 2015-04-12 LAB — BASIC METABOLIC PANEL
Anion gap: 9 (ref 5–15)
BUN: 7 mg/dL (ref 6–20)
CO2: 26 mmol/L (ref 22–32)
Calcium: 8.9 mg/dL (ref 8.9–10.3)
Chloride: 101 mmol/L (ref 101–111)
Creatinine, Ser: 1.04 mg/dL (ref 0.61–1.24)
GFR calc Af Amer: >60 mL/min (ref >60)
GFR calc non Af Amer: >60 mL/min (ref >60)
Glucose, Bld: 81 mg/dL (ref 65–99)
Potassium: 3.7 mmol/L (ref 3.5–5.1)
Sodium: 136 mmol/L (ref 135–145)

## 2015-04-12 LAB — PROTIME-INR
INR: 1.1 (ref 0.00–1.49)
Prothrombin Time: 14.4 seconds (ref 11.6–15.2)

## 2015-04-12 LAB — TROPONIN I: Troponin I: <0.03 ng/mL (ref <0.031)

## 2015-04-12 LAB — APTT: aPTT: 31 seconds (ref 24–37)

## 2015-04-12 LAB — I-STAT TROPONIN, ED: Troponin i, poc: 0 ng/mL (ref 0.00–0.08)

## 2015-04-12 LAB — ETHANOL: Alcohol, Ethyl (B): <5 mg/dL (ref <5)

## 2015-04-12 LAB — TSH: TSH: 0.9 u[IU]/mL (ref 0.350–4.500)

## 2015-04-12 MED ORDER — SODIUM CHLORIDE 0.9 % IV SOLN
500.0000 mg | Freq: Two times a day (BID) | INTRAVENOUS | Status: DC
  Administered 2015-04-12 – 2015-04-13 (×2): 500 mg via INTRAVENOUS
  Filled 2015-04-12 (×4): qty 5

## 2015-04-12 MED ORDER — ONDANSETRON HCL 4 MG/2ML IJ SOLN: 4.0000 mg | Freq: Four times a day (QID) | INTRAMUSCULAR | Status: DC | PRN

## 2015-04-12 MED ORDER — SODIUM CHLORIDE 0.9 % IV SOLN
1000.0000 mg | INTRAVENOUS | Status: AC
  Administered 2015-04-12: 1000 mg via INTRAVENOUS
  Filled 2015-04-12: qty 10

## 2015-04-12 MED ORDER — NITROGLYCERIN 0.4 MG SL SUBL: 0.4000 mg | SUBLINGUAL_TABLET | SUBLINGUAL | Status: DC | PRN

## 2015-04-12 MED ORDER — CLOPIDOGREL BISULFATE 75 MG PO TABS
75.0000 mg | ORAL_TABLET | Freq: Every day | ORAL | Status: DC
  Administered 2015-04-13: 75 mg via ORAL
  Filled 2015-04-12: qty 1

## 2015-04-12 MED ORDER — HYDROMORPHONE HCL 2 MG PO TABS
4.0000 mg | ORAL_TABLET | ORAL | Status: DC | PRN
  Administered 2015-04-12 – 2015-04-13 (×4): 4 mg via ORAL
  Filled 2015-04-12 (×4): qty 2

## 2015-04-12 MED ORDER — ACETAMINOPHEN 325 MG PO TABS: 650.0000 mg | ORAL_TABLET | Freq: Four times a day (QID) | ORAL | Status: DC | PRN

## 2015-04-12 MED ORDER — MORPHINE SULFATE ER 15 MG PO TBCR
15.0000 mg | EXTENDED_RELEASE_TABLET | Freq: Two times a day (BID) | ORAL | Status: DC
  Administered 2015-04-12 – 2015-04-13 (×2): 15 mg via ORAL
  Filled 2015-04-12 (×2): qty 1

## 2015-04-12 MED ORDER — HYDROMORPHONE HCL 1 MG/ML IJ SOLN
1.0000 mg | Freq: Once | INTRAMUSCULAR | Status: AC
  Administered 2015-04-12: 1 mg via INTRAVENOUS
  Filled 2015-04-12: qty 1

## 2015-04-12 MED ORDER — CYCLOBENZAPRINE HCL 5 MG PO TABS: 5.0000 mg | ORAL_TABLET | Freq: Two times a day (BID) | ORAL | Status: DC | PRN

## 2015-04-12 MED ORDER — TAMSULOSIN HCL 0.4 MG PO CAPS
0.4000 mg | ORAL_CAPSULE | Freq: Every day | ORAL | Status: DC
  Administered 2015-04-12: 0.4 mg via ORAL
  Filled 2015-04-12: qty 1

## 2015-04-12 MED ORDER — ENOXAPARIN SODIUM 40 MG/0.4ML ~~LOC~~ SOLN: 40.0000 mg | SUBCUTANEOUS | Status: DC

## 2015-04-12 MED ORDER — ASPIRIN EC 81 MG PO TBEC
81.0000 mg | DELAYED_RELEASE_TABLET | Freq: Every day | ORAL | Status: DC
  Administered 2015-04-13: 81 mg via ORAL
  Filled 2015-04-12: qty 1

## 2015-04-12 MED ORDER — CLONAZEPAM 0.5 MG PO TABS
0.2500 mg | ORAL_TABLET | Freq: Two times a day (BID) | ORAL | Status: DC | PRN
  Administered 2015-04-13: 0.25 mg via ORAL
  Filled 2015-04-12: qty 1

## 2015-04-12 MED ORDER — PANTOPRAZOLE SODIUM 40 MG PO TBEC
40.0000 mg | DELAYED_RELEASE_TABLET | Freq: Every day | ORAL | Status: DC
  Administered 2015-04-13: 40 mg via ORAL
  Filled 2015-04-12: qty 1

## 2015-04-12 MED ORDER — PAROXETINE HCL 20 MG PO TABS
20.0000 mg | ORAL_TABLET | ORAL | Status: DC
  Administered 2015-04-13: 20 mg via ORAL
  Filled 2015-04-12: qty 1

## 2015-04-12 MED ORDER — ACETAMINOPHEN 650 MG RE SUPP: 650.0000 mg | Freq: Four times a day (QID) | RECTAL | Status: DC | PRN

## 2015-04-12 MED ORDER — ONDANSETRON HCL 4 MG PO TABS
4.0000 mg | ORAL_TABLET | Freq: Four times a day (QID) | ORAL | Status: DC | PRN
Start: 2015-04-12 — End: 2015-04-13

## 2015-04-12 NOTE — Consult Note (Signed)
Referring Physician: Hyacinth Meeker    Chief Complaint: Code stroke  HPI:                                                                                                                                         Justin Wells is an 64 y.o. male with history of of stroke and residual right sided weakness and decreased sensation. Presenting today with episode of diaphoresis followed by staring and AMS which lead to leaning to the right.  In conversation wife states he has about 2 of these episodes a week.  Patient does not recall event and often sleeps for about one day after event. He recently saw his PCP who thought these may represent seizure.  Currently patient is back to his baseline but has slow thought processing.   Date last known well: Date: 04/12/2015 Time last known well: Time: 14:30 tPA Given: No: From description likely seizure.      Past Medical History  Diagnosis Date  . Peripheral arterial disease   . Hyperlipidemia   . Coronary artery disease   . Hypertension   . Barrett's esophagus   . Gastroesophageal reflux   . Cerebrovascular disease   . Myocardial infarction 2009    "during cardiac cath" (01/27/2013)  . H/O hiatal hernia   . ZOXWRUEA(540.9)     "probably monthly" (01/27/2013)  . Seizures     "when I had my heart attack in 2009" (01/27/2013)  . 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 Arthritis     "hands" (01/27/2013)    Past Surgical History  Procedure Laterality Date  . Laparoscopic gastric banding  2010  . Cardiac catheterization    . Coronary angioplasty with stent placement  11/02/2007    "2" (01/27/2013)  . Laparoscopic nissen fundoplication  ?2001  . Femoral artery stent Right 01/26/2013  . Diagnostic laparoscopy      "twice after nissen; it kept coming apart " (01/27/2013)  . Abdominal aortagram N/A 01/26/2013    Procedure: ABDOMINAL Ronny Flurry;   Surgeon: Nada Libman, MD;  Location: [facility] CATH LAB;  Service: Cardiovascular;  Laterality: N/A;  . Lower extremity angiogram Left 03/02/2013    Procedure: LOWER EXTREMITY ANGIOGRAM;  Surgeon: Nada Libman, MD;  Location: [facility] CATH LAB;  Service: Cardiovascular;  Laterality: Left;  . Left heart catheterization with coronary angiogram N/A 03/09/2014    Procedure: LEFT HEART CATHETERIZATION WITH CORONARY ANGIOGRAM;  Surgeon: Iran Ouch, MD;  Location: [facility] CATH LAB;  Service: Cardiovascular;  Laterality: N/A;    Family History  Problem Relation Age of Onset  . Heart disease Mother     Heart Disease before age 10  . Hypertension Mother   . Heart attack Mother   . Heart attack Father  Social History:  reports that he quit smoking about 14 years ago. His smoking use included Cigarettes. He has a 70 pack-year smoking history. He has never used smokeless tobacco. He reports that he does not drink alcohol or use illicit drugs.  Allergies: No Known Allergies  Medications:                                                                                                                           Current Facility-Administered Medications  Medication Dose Route Frequency Provider Last Rate Last Dose  . levETIRAcetam (KEPPRA) 1,000 mg in sodium chloride 0.9 % 100 mL IVPB  1,000 mg Intravenous STAT Ulice Dash, PA-C      . levETIRAcetam (KEPPRA) 500 mg in sodium chloride 0.9 % 100 mL IVPB  500 mg Intravenous Q12H Ulice Dash, PA-C      . nitroGLYCERIN (NITROSTAT) SL tablet 0.4 mg  0.4 mg Sublingual Q5 min PRN Eyvonne Mechanic, PA-C       Current Outpatient Prescriptions  Medication Sig Dispense Refill  . aspirin EC 81 MG tablet Take 81 mg by mouth daily.    . clonazePAM (KLONOPIN) 0.5 MG tablet Take 0.5 tablets (0.25 mg total) by mouth 2 (two) times daily. (Patient taking differently: Take 0.25 mg by mouth 2 (two) times daily as needed for anxiety. ) 60 tablet 0  . clopidogrel (PLAVIX) 75 MG  tablet TAKE ONE TABLET BY MOUTH ONCE DAILY 30 tablet 7  . esomeprazole (NEXIUM) 40 MG capsule Take 40 mg by mouth at bedtime.     . fish oil-omega-3 fatty acids 1000 MG capsule Take 1 g by mouth 2 (two) times daily.     Marland Kitchen HYDROmorphone (DILAUDID) 4 MG tablet Take 4 mg by mouth every 4 (four) hours as needed (for breakthrough pain).     Marland Kitchen lisinopril (PRINIVIL,ZESTRIL) 5 MG tablet Take 1 tablet (5 mg total) by mouth daily. 30 tablet 1  . morphine (MS CONTIN) 15 MG 12 hr tablet Take 15 mg by mouth 2 (two) times daily.    . Multiple Vitamins-Minerals (ONE-A-DAY MENS VITACRAVES PO) Take 1 tablet by mouth daily.     . nitroGLYCERIN (NITROSTAT) 0.4 MG SL tablet Place 0.4 mg under the tongue every 5 (five) minutes as needed for chest pain.    Marland Kitchen PARoxetine (PAXIL) 20 MG tablet Take 1 tablet (20 mg total) by mouth every morning. 30 tablet 1  . tamsulosin (FLOMAX) 0.4 MG CAPS capsule Take 0.4 mg by mouth at bedtime.    Marland Kitchen atorvastatin (LIPITOR) 80 MG tablet Take 1 tablet (80 mg total) by mouth daily at 6 (six) AM. 30 tablet 0  . cyclobenzaprine (FLEXERIL) 10 MG tablet Take 0.5 tablets (5 mg total) by mouth 2 (two) times daily as needed for muscle spasms. 60 tablet 5     ROS:  History obtained from the patient  General ROS: negative for - chills, fatigue, fever, night sweats, weight gain or weight loss Psychological ROS: negative for - behavioral disorder, hallucinations, memory difficulties, mood swings or suicidal ideation Ophthalmic ROS: negative for - blurry vision, double vision, eye pain or loss of vision ENT ROS: negative for - epistaxis, nasal discharge, oral lesions, sore throat, tinnitus or vertigo Allergy and Immunology ROS: negative for - hives or itchy/watery eyes Hematological and Lymphatic ROS: negative for - bleeding problems, bruising or swollen lymph  nodes Endocrine ROS: negative for - galactorrhea, hair pattern changes, polydipsia/polyuria or temperature intolerance Respiratory ROS: negative for - cough, hemoptysis, shortness of breath or wheezing Cardiovascular ROS: negative for - chest pain, dyspnea on exertion, edema or irregular heartbeat Gastrointestinal ROS: negative for - abdominal pain, diarrhea, hematemesis, nausea/vomiting or stool incontinence Genito-Urinary ROS: negative for - dysuria, hematuria, incontinence or urinary frequency/urgency Musculoskeletal ROS: negative for - joint swelling or muscular weakness Neurological ROS: as noted in HPI Dermatological ROS: negative for rash and skin lesion changes  Neurologic Examination:                                                                                                      Blood pressure 137/73, pulse 67, temperature 98.2 F (36.8 C), temperature source Oral, resp. rate 20, height 5\' 10"  (1.778 m), weight 93.895 kg (207 lb), SpO2 97 %.  HEENT-  Normocephalic, no lesions, without obvious abnormality.  Normal external eye and conjunctiva.  Normal TM's bilaterally.  Normal auditory canals and external ears. Normal external nose, mucus membranes and septum.  Normal pharynx. Cardiovascular- S1, S2 normal, pulses palpable throughout   Lungs- chest clear, no wheezing, rales, normal symmetric air entry Abdomen- normal findings: bowel sounds normal Extremities- no edema Lymph-no adenopathy palpable Musculoskeletal-no joint tenderness, deformity or swelling Skin-warm and dry, no hyperpigmentation, vitiligo, or suspicious lesions  Neurological Examination Mental Status: Alert, oriented, thought content appropriate.  Speech fluent without evidence of aphasia.  Able to follow 3 step commands without difficulty. Cranial Nerves: II: Discs flat bilaterally; Visual fields grossly normal, pupils equal, round, reactive to light and accommodation III,IV, VI: ptosis not present,  extra-ocular motions intact bilaterally V,VII: smile symmetric, facial light touch sensation decreased on the right VIII: hearing normal bilaterally IX,X: uvula rises symmetrically XI: bilateral shoulder shrug XII: midline tongue extension Motor: Right : Upper extremity   4/5    Left:     Upper extremity   5/5  Lower extremity   4/5     Lower extremity   5/5 Tone and bulk:normal tone throughout; no atrophy noted Sensory: Pinprick and light touch decreased on the right.  Deep Tendon Reflexes: 2+ and symmetric throughout Plantars: Right: downgoing   Left: downgoing Cerebellar: normal finger-to-nose,and normal heel-to-shin test on the left and dysmetric on the right but due to weakness.  Gait: not tested due to multiple leads.        Lab Results: Basic Metabolic Panel:  Recent Labs Lab 04/12/15 1605  NA 136  K 3.8  CL 100*  GLUCOSE 77  BUN 9  CREATININE 1.00    Liver Function Tests: No results for input(s): AST, ALT, ALKPHOS, BILITOT, PROT, ALBUMIN in the last 168 hours. No results for input(s): LIPASE, AMYLASE in the last 168 hours. No results for input(s): AMMONIA in the last 168 hours.  CBC:  Recent Labs Lab 04/12/15 1540 04/12/15 1605  WBC 10.2  --   NEUTROABS 7.7  --   HGB 13.9 13.6  HCT 39.4 40.0  MCV 85.5  --   PLT 182  --     Cardiac Enzymes: No results for input(s): CKTOTAL, CKMB, CKMBINDEX, TROPONINI in the last 168 hours.  Lipid Panel: No results for input(s): CHOL, TRIG, HDL, CHOLHDL, VLDL, LDLCALC in the last 168 hours.  CBG: No results for input(s): GLUCAP in the last 168 hours.  Microbiology: Results for orders placed or performed during the hospital encounter of 12/27/13  MRSA PCR Screening     Status: None   Collection Time: 12/27/13  6:32 PM  Result Value Ref Range Status   MRSA by PCR NEGATIVE NEGATIVE Final    Comment:        The GeneXpert MRSA Assay (FDA approved for NASAL specimens only), is one component of a comprehensive  MRSA colonization surveillance program. It is not intended to diagnose MRSA infection nor to guide or monitor treatment for MRSA infections.    Coagulation Studies: No results for input(s): LABPROT, INR in the last 72 hours.  Imaging: Ct Head Wo Contrast  04/12/2015   CLINICAL DATA:  RIGHT-sided weakness and facial droop, stroke like symptoms, history of stroke, coronary artery disease, peripheral arterial disease, hyperlipidemia, hypertension, MI, former smoker, seizures  EXAM: CT HEAD WITHOUT CONTRAST  TECHNIQUE: Contiguous axial images were obtained from the base of the skull through the vertex without intravenous contrast.  COMPARISON:  08/21/2014  FINDINGS: Normal ventricular morphology.  No midline shift or mass effect.  Normal appearance of brain parenchyma.  No intracranial hemorrhage, mass lesion, or acute infarction.  Visualized paranasal sinuses and mastoid air cells clear.  Bones unremarkable.  Mild atherosclerotic calcification at the carotid siphons.  IMPRESSION: No acute intracranial abnormalities.  Findings called to Dr. Roseanne Reno and Dr. Hyacinth Meeker on 04/12/2015 at 1622 hours.   Electronically Signed   By: Ulyses Southward M.D.   On: 04/12/2015 16:23       Assessment and plan discussed with with attending physician and they are in agreement.    Felicie Morn PA-C [facility] [phone]  04/12/2015, 4:27 PM   Assessment: 64 y.o. male presenting to hospital with acute onset of diaphoresis and AMS which is improving.  Patient has been experiencing similar spells at least twice a week since his CVA.  Description patient's recurrent pattern of symptoms, of which the patient likely has no memory, is consistent with complex partial seizure disorder.  Recommend: 1) MRI brain without contrast 2) Keppra loaded at 1000 mg followed by 500 mg BID 3) EEG, routine adult study  I personally participated in this patient's evaluation and management, including clinical examination, as  well as formulating the above clinical impression and management recommendations.  Venetia Maxon M.D. [facility] [phone]

## 2015-04-12 NOTE — ED Notes (Signed)
Pt was cleaning the vacuum when he felt dizzy, pain in neck, was diaphoretic, confused. These spells have been happening about 3x a week.

## 2015-04-12 NOTE — H&P (Signed)
[facility] History and Physical  Justin Wells YQI:[id number] DOB: 05/07/1951 DOA: 04/12/2015  Referring physician: EDP PCP: Carmin Richmond, MD   Chief Complaint: Code stroke  HPI: Justin Wells is a 64 y.o. male Justin Wells is an 64 y.o. male with a past medical history significant for HTN, hyperlipidemia, CAD s/p stent deployment x 3 in 2009, PAD s/p femoral stenting, TIA's, ischemic stroke in the setting of acute MI 2014, recurrent admissions for stroke like symptoms, with negative workup, anxiety, chronic neck/shoulder pain, he was in his usual state of health this morning. We'll according to his wife he was cleaning the Vaccuum cleaner and then he walked back into the house and the doorway she noticed that he was, shaky, dizzy and when she asked him what happened he said "I think him having another episode" and then he subsequently slumped down to one side. Subsequently his wife checked his blood sugar  this was 67 she gave him some [facility] and  according to the wife and was slumping over to one side from subsequently started becoming less responsive he was still mumbling and answering some questions but more drowsy. Wife also reports that he was started on OxyContin 2 weeks ago for his chronic neck pain, and oral Dilaudid was added last week. In addition he also reports ongoing chronic neck back and shoulder pain. In the emergency room was seen by neurology and started on IV Keppra for possible seizure is currently getting an EEG done   Review of Systems: Unable to obtain due to drowsiness Constitutional:  No weight loss, night sweats, Fevers, chills, fatigue.  HEENT:  No headaches, Difficulty swallowing,Tooth/dental problems,Sore throat,  No sneezing, itching, ear ache, nasal congestion, post nasal drip,  Cardio-vascular:  No chest pain, Orthopnea, PND, swelling in lower extremities, anasarca, dizziness, palpitations  GI:  No heartburn, indigestion, abdominal pain,  nausea, vomiting, diarrhea, change in bowel habits, loss of appetite  Resp:  No shortness of breath with exertion or at rest. No excess mucus, no productive cough, No non-productive cough, No coughing up of blood.No change in color of mucus.No wheezing.No chest wall deformity  Skin:  no rash or lesions.  GU:  no dysuria, change in color of urine, no urgency or frequency. No flank pain.  Musculoskeletal:  No joint pain or swelling. No decreased range of motion. No back pain.  Psych:  No change in mood or affect. No depression or anxiety. No memory loss.   Past Medical History  Diagnosis Date  . Peripheral arterial disease   . Hyperlipidemia   . Coronary artery disease   . Hypertension   . Barrett's esophagus   . Gastroesophageal reflux   . Cerebrovascular disease   . Myocardial infarction 2009    "during cardiac cath" (01/27/2013)  . H/O hiatal hernia   . LOVFIEPP(295.1)     "probably monthly" (01/27/2013)  . Seizures     "when I had my heart attack in 2009" (01/27/2013)  . 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 Arthritis     "hands" (01/27/2013)   Past Surgical History  Procedure Laterality Date  . Laparoscopic gastric banding  2010  . Cardiac catheterization    . Coronary angioplasty with stent placement  11/02/2007    "2" (01/27/2013)  . Laparoscopic nissen fundoplication  ?2001  . Femoral artery stent  Right 01/26/2013  . Diagnostic laparoscopy      "twice after nissen; it kept coming apart " (01/27/2013)  . Abdominal aortagram N/A 01/26/2013    Procedure: ABDOMINAL Ronny Flurry;  Surgeon: Nada Libman, MD;  Location: [facility] CATH LAB;  Service: Cardiovascular;  Laterality: N/A;  . Lower extremity angiogram Left 03/02/2013    Procedure: LOWER EXTREMITY ANGIOGRAM;  Surgeon: Nada Libman, MD;  Location: [facility] CATH LAB;  Service: Cardiovascular;  Laterality: Left;  . Left  heart catheterization with coronary angiogram N/A 03/09/2014    Procedure: LEFT HEART CATHETERIZATION WITH CORONARY ANGIOGRAM;  Surgeon: Iran Ouch, MD;  Location: [facility] CATH LAB;  Service: Cardiovascular;  Laterality: N/A;   Social History:  reports that he quit smoking about 14 years ago. His smoking use included Cigarettes. He has a 70 pack-year smoking history. He has never used smokeless tobacco. He reports that he does not drink alcohol or use illicit drugs.  No Known Allergies  Family History  Problem Relation Age of Onset  . Heart disease Mother     Heart Disease before age 33  . Hypertension Mother   . Heart attack Mother   . Heart attack Father     Prior to Admission medications   Medication Sig Start Date End Date Taking? Authorizing Provider  aspirin EC 81 MG tablet Take 81 mg by mouth daily.   Yes Historical Provider, MD  clonazePAM (KLONOPIN) 0.5 MG tablet Take 0.5 tablets (0.25 mg total) by mouth 2 (two) times daily. Patient taking differently: Take 0.25 mg by mouth 2 (two) times daily as needed for anxiety.  03/10/14  Yes Adeline Joselyn Glassman, MD  clopidogrel (PLAVIX) 75 MG tablet TAKE ONE TABLET BY MOUTH ONCE DAILY 12/06/14  Yes Nada Libman, MD  esomeprazole (NEXIUM) 40 MG capsule Take 40 mg by mouth at bedtime.  07/01/14 07/01/15 Yes Historical Provider, MD  fish oil-omega-3 fatty acids 1000 MG capsule Take 1 g by mouth 2 (two) times daily.    Yes Historical Provider, MD  HYDROmorphone (DILAUDID) 4 MG tablet Take 4 mg by mouth every 4 (four) hours as needed (for breakthrough pain).  04/03/15  Yes Historical Provider, MD  lisinopril (PRINIVIL,ZESTRIL) 5 MG tablet Take 1 tablet (5 mg total) by mouth daily. 12/31/13  Yes Daniel J Angiulli, PA-C  morphine (MS CONTIN) 15 MG 12 hr tablet Take 15 mg by mouth 2 (two) times daily. 04/06/15 05/06/15 Yes Historical Provider, MD  Multiple Vitamins-Minerals (ONE-A-DAY MENS VITACRAVES PO) Take 1 tablet by mouth daily.    Yes Historical Provider,  MD  nitroGLYCERIN (NITROSTAT) 0.4 MG SL tablet Place 0.4 mg under the tongue every 5 (five) minutes as needed for chest pain.   Yes Historical Provider, MD  PARoxetine (PAXIL) 20 MG tablet Take 1 tablet (20 mg total) by mouth every morning. 12/31/13  Yes Daniel J Angiulli, PA-C  tamsulosin (FLOMAX) 0.4 MG CAPS capsule Take 0.4 mg by mouth at bedtime. 03/15/15  Yes Historical Provider, MD  atorvastatin (LIPITOR) 80 MG tablet Take 1 tablet (80 mg total) by mouth daily at 6 (six) AM. 03/10/14   Kela Millin, MD  cyclobenzaprine (FLEXERIL) 10 MG tablet Take 0.5 tablets (5 mg total) by mouth 2 (two) times daily as needed for muscle spasms. 07/13/14   Ronal Fear, NP   Physical Exam: Filed Vitals:   04/12/15 1531 04/12/15 1615  BP: 137/73 137/76  Pulse: 67 67  Temp: 98.2 F (36.8 C)   TempSrc: Oral  Resp: 20 15  Height:  (1.778 m)   Weight: 93.895 kg (207 lb)   SpO2: 97% 96%    Wt Readings from Last 3 Encounters:  04/12/15 93.895 kg (207 lb)  07/13/14 94.121 kg (207 lb 8 oz)  04/08/14 95.89 kg (211 lb 6.4 oz)    General:  Appears calm and comfortable, AAO to self, place and person Eyes: PERRL, normal lids, irises & conjunctiva ENT: grossly normal hearing, lips & tongue Neck: no LAD, masses or thyromegaly Cardiovascular: RRR, no m/r/g. No LE edema. Telemetry: SR, no arrhythmias  Respiratory: CTA bilaterally, no w/r/r. Normal respiratory effort. Abdomen: soft, ntnd Skin: no rash or induration seen on limited exam Musculoskeletal: grossly normal tone BUE/BLE Psychiatric: grossly normal mood and affect, speech fluent and appropriate Neurologic: Moves all extremities, poor effort pain with movement of left lower extremity ,           Labs on Admission:  Basic Metabolic Panel:  Recent Labs Lab 04/12/15 1540 04/12/15 1605  NA 136 136  K 3.7 3.8  CL 101 100*  CO2 26  --   GLUCOSE 81 77  BUN 7 9  CREATININE 1.04 1.00  CALCIUM 8.9  --    Liver Function Tests: No  results for input(s): AST, ALT, ALKPHOS, BILITOT, PROT, ALBUMIN in the last 168 hours. No results for input(s): LIPASE, AMYLASE in the last 168 hours. No results for input(s): AMMONIA in the last 168 hours. CBC:  Recent Labs Lab 04/12/15 1540 04/12/15 1605  WBC 10.2  --   NEUTROABS 7.7  --   HGB 13.9 13.6  HCT 39.4 40.0  MCV 85.5  --   PLT 182  --    Cardiac Enzymes: No results for input(s): CKTOTAL, CKMB, CKMBINDEX, TROPONINI in the last 168 hours.  BNP (last 3 results) No results for input(s): BNP in the last 8760 hours.  ProBNP (last 3 results) No results for input(s): PROBNP in the last 8760 hours.  CBG: No results for input(s): GLUCAP in the last 168 hours.  Radiological Exams on Admission: Ct Head Wo Contrast  04/12/2015   CLINICAL DATA:  RIGHT-sided weakness and facial droop, stroke like symptoms, history of stroke, coronary artery disease, peripheral arterial disease, hyperlipidemia, hypertension, MI, former smoker, seizures  EXAM: CT HEAD WITHOUT CONTRAST  TECHNIQUE: Contiguous axial images were obtained from the base of the skull through the vertex without intravenous contrast.  COMPARISON:  08/21/2014  FINDINGS: Normal ventricular morphology.  No midline shift or mass effect.  Normal appearance of brain parenchyma.  No intracranial hemorrhage, mass lesion, or acute infarction.  Visualized paranasal sinuses and mastoid air cells clear.  Bones unremarkable.  Mild atherosclerotic calcification at the carotid siphons.  IMPRESSION: No acute intracranial abnormalities.  Findings called to Dr. Roseanne Reno and Dr. Hyacinth Meeker on 04/12/2015 at 1622 hours.   Electronically Signed   By: Ulyses Southward M.D.   On: 04/12/2015 16:23    EKG: Independently reviewed. NSR, abnormal R wave progression  Assessment/Plan    Decreased responsiveness/ Seizure   -etiology unclear, drowsy now getting EEG and also just got a dose of Dilaudid for his pain in ER   -check MRI brain   -check EEG   -Neurology  consulted   -started on IV keppra   -suspect psychological overlay too    Anxiety state   -continue Klonopin PRN    Chronic pain syndrome   -continue MS contin and Po dilaudid per home regimen   -d/w wife risks  with narcotic dependence    H/o CAD and stents in 2009,    -repeat Cath in 7/15 with patent stents, medical therapy recommended   -reports chest pain earlier today and now resolved   -check troponin      HTN -stable      PAD -s/p Femoral artery stent in 5/14 -continue plavix  Code Status: Full Code DVT Prophylaxis: lovenox Family Communication: wife at bedside Disposition Plan: inpatient, home after workup  Time spent:  [facility] Pager [phone]

## 2015-04-12 NOTE — ED Provider Notes (Signed)
CSN: [id number]     Arrival date & time 04/12/15  1514 History   First MD Initiated Contact with Patient 04/12/15 1514     Chief Complaint  Patient presents with  . Chest Pain    HPI  Justin Wells is an 64 y.o. male with a past medical history significant for HTN, hyperlipidemia, CAD s/p stent deployment x 3, MI, PAD s/p femoral stenting, TIA's, ischemic stroke in the setting of acute MI 2014 presents today with headache, chest pain, nausea diaphoresis and weakness that started at approximately 2:30 this afternoon. Patient reports he was in his shop cleaning out his vacuum  when he felt what he describes as a recurrent episode of "TIAs". This included dizziness, headache, diaphoresis, and pain in his neck and shoulder. He reports the addition of chest pain, this is not normal for his typical "TIA". He reports he has these approximately twice a week, usually can sit down and relax and they pass within 15-20 minutes. Patient reports symptoms were more severe than previous and have persisted, called 911 for further evaluation. Patient reports the chest pain as "pressure", not as severe as previous MIs. He reports radiation into his left shoulder and arm.   Patient has a significant past medical history of MIs, with drug-eluting stents in his lower extremities.   Patient was admitted in July 2015 for atypical chest pain, coronary artery disease, questionable TIA, hypertension.- MRI/MRA negative for acute stroke at that time, likely panic attack per neurology. Atypical chest pain at that time with widely patent stent in mid  LCx, with EF 60%.   Patient received a MR pain without contrast and CT and your chest aortic dissection study, CT angiogram abdomen pelvis, CT head without contrast in 2015 which showed no signs of stroke.    Past Medical History  Diagnosis Date  . Peripheral arterial disease   . Hyperlipidemia   . Coronary artery disease   . Hypertension   . Barrett's esophagus   .  Gastroesophageal reflux   . Cerebrovascular disease   . Myocardial infarction 2009    "during cardiac cath" (01/27/2013)  . H/O hiatal hernia   . [id number](478.2)     "probably monthly" (01/27/2013)  . Seizures     "when I had my heart attack in 2009" (01/27/2013)  . 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 Arthritis     "hands" (01/27/2013)   Past Surgical History  Procedure Laterality Date  . Laparoscopic gastric banding  2010  . Cardiac catheterization    . Coronary angioplasty with stent placement  11/02/2007    "2" (01/27/2013)  . Laparoscopic nissen fundoplication  ?2001  . Femoral artery stent Right 01/26/2013  . Diagnostic laparoscopy      "twice after nissen; it kept coming apart " (01/27/2013)  . Abdominal aortagram N/A 01/26/2013    Procedure: ABDOMINAL Ronny Flurry;  Surgeon: Nada Libman, MD;  Location[facility] C[facility] LAB;  Service: Cardiovascular;  Laterality: N/A;  . Lower extremity angiogram Left 03/02/2013    Procedure: LOWER EXTREMITY ANGIOGRAM;  Surgeon: Nada Libman, MD;  Location: [facility] CATH LAB;  Service: Cardiovascular;  Laterality: Left;  . Left heart catheterization with coronary angiogram N/A 03/09/2014    Procedure: LEFT HEART CATHETERIZATION WITH CORONARY ANGIOGRAM;  Surgeon: Iran Ouch, MD;  Location: [facility] CATH LAB;  Service: Cardiovascular;  Laterality: N/A;   Family History  Problem Relation Age of Onset  . Heart disease Mother     Heart Disease before age 88  . Hypertension Mother   . Heart attack Mother   . Heart attack Father    Social History  Substance Use Topics  . Smoking status: Former Smoker -- 2.00 packs/day for 35 years    Types: Cigarettes    Quit date: 01/28/2001  . Smokeless tobacco: Never Used  . Alcohol Use: No     Comment: 01/27/2013 "quit drinking in 2001; I'm a recovering alcoholic"    Review of Systems  All other systems  reviewed and are negative.   Allergies  Review of patient's allergies indicates no known allergies.  Home Medications   Prior to Admission medications   Medication Sig Start Date End Date Taking? Authorizing Provider  aspirin EC 81 MG tablet Take 81 mg by mouth daily.   Yes Historical Provider, MD  clonazePAM (KLONOPIN) 0.5 MG tablet Take 0.5 tablets (0.25 mg total) by mouth 2 (two) times daily. Patient taking differently: Take 0.25 mg by mouth 2 (two) times daily as needed for anxiety.  03/10/14  Yes Adeline Joselyn Glassman, MD  clopidogrel (PLAVIX) 75 MG tablet TAKE ONE TABLET BY MOUTH ONCE DAILY 12/06/14  Yes Nada Libman, MD  esomeprazole (NEXIUM) 40 MG capsule Take 40 mg by mouth at bedtime.  07/01/14 07/01/15 Yes Historical Provider, MD  fish oil-omega-3 fatty acids 1000 MG capsule Take 1 g by mouth 2 (two) times daily.    Yes Historical Provider, MD  HYDROmorphone (DILAUDID) 4 MG tablet Take 4 mg by mouth every 4 (four) hours as needed (for breakthrough pain).  04/03/15  Yes Historical Provider, MD  lisinopril (PRINIVIL,ZESTRIL) 5 MG tablet Take 1 tablet (5 mg total) by mouth daily. 12/31/13  Yes Daniel J Angiulli, PA-C  morphine (MS CONTIN) 15 MG 12 hr tablet Take 15 mg by mouth 2 (two) times daily. 04/06/15 05/06/15 Yes Historical Provider, MD  Multiple Vitamins-Minerals (ONE-A-DAY MENS VITACRAVES PO) Take 1 tablet by mouth daily.    Yes Historical Provider, MD  nitroGLYCERIN (NITROSTAT) 0.4 MG SL tablet Place 0.4 mg under the tongue every 5 (five) minutes as needed for chest pain.   Yes Historical Provider, MD  PARoxetine (PAXIL) 20 MG tablet Take 1 tablet (20 mg total) by mouth every morning. 12/31/13  Yes Daniel J Angiulli, PA-C  tamsulosin (FLOMAX) 0.4 MG CAPS capsule Take 0.4 mg by mouth at bedtime. 03/15/15  Yes Historical Provider, MD  atorvastatin (LIPITOR) 80 MG tablet Take 1 tablet (80 mg total) by mouth daily at 6 (six) AM. 03/10/14   Kela Millin, MD  cyclobenzaprine (FLEXERIL) 10 MG  tablet Take 0.5 tablets (5 mg total) by mouth 2 (two) times daily as needed for muscle spasms. 07/13/14   Ronal Fear, NP   BP 137/76 mmHg  Pulse 67  Temp(Src) 98.2 F (36.8 C) (Oral)  Resp 15  Ht 5\' 10"  (1.778 m)  Wt 207 lb (93.895 kg)  BMI 29.70 kg/m2  SpO2 96%   Physical Exam  Constitutional: He is oriented to person, place, and time. He appears well-developed and well-nourished.  HENT:  Head: Normocephalic and atraumatic.  Eyes: Conjunctivae are normal. Pupils are equal, round, and reactive to light. Right eye exhibits no discharge. Left eye exhibits no discharge. No scleral icterus.  Neck: Normal range of motion. Neck supple. No JVD present. No tracheal deviation present.  Pulmonary/Chest: Effort normal. No  stridor.  Chest wall tender to palpation over the sternum and left anterior  Abdominal: Soft. He exhibits no distension and no mass. There is no tenderness. There is no rebound and no guarding.  Surgical scars noted  Musculoskeletal: Normal range of motion. He exhibits no edema or tenderness.  Neurological: He is alert and oriented to person, place, and time. Coordination normal. GCS eye subscore is 4. GCS verbal subscore is 5. GCS motor subscore is 6.  Patient has decreased sensation grossly to his right side, 4 out of 5 strength in the upper and lower extremities. Right-sided pronator drift, right-sided facial droop. No slurred speech, confusion. Visual fields normal.   Skin: Skin is warm and dry.  Psychiatric: He has a normal mood and affect. His behavior is normal. Judgment and thought content normal.  Nursing note and vitals reviewed.   ED Course  Procedures (including critical care time) Labs Review Labs Reviewed  CBC WITH DIFFERENTIAL/PLATELET - Abnormal; Notable for the following:    Lymphocytes Relative 11 (*)    All other components within normal limits  I-STAT CHEM 8, ED - Abnormal; Notable for the following:    Chloride 100 (*)    Calcium, Ion 1.09 (*)     All other components within normal limits  BASIC METABOLIC PANEL  PROTIME-INR  APTT  ETHANOL  URINE RAPID DRUG SCREEN, HOSP PERFORMED  URINALYSIS, ROUTINE W REFLEX MICROSCOPIC (NOT AT [facility])  I-STAT TROPOININ, ED  I-STAT TROPOININ, ED    Imaging Review Ct Head Wo Contrast  04/12/2015   CLINICAL DATA:  RIGHT-sided weakness and facial droop, stroke like symptoms, history of stroke, coronary artery disease, peripheral arterial disease, hyperlipidemia, hypertension, MI, former smoker, seizures  EXAM: CT HEAD WITHOUT CONTRAST  TECHNIQUE: Contiguous axial images were obtained from the base of the skull through the vertex without intravenous contrast.  COMPARISON:  08/21/2014  FINDINGS: Normal ventricular morphology.  No midline shift or mass effect.  Normal appearance of brain parenchyma.  No intracranial hemorrhage, mass lesion, or acute infarction.  Visualized paranasal sinuses and mastoid air cells clear.  Bones unremarkable.  Mild atherosclerotic calcification at the carotid siphons.  IMPRESSION: No acute intracranial abnormalities.  Findings called to Dr. Roseanne Reno and Dr. Hyacinth Meeker on 04/12/2015 at 1622 hours.   Electronically Signed   By: Ulyses Southward M.D.   On: 04/12/2015 16:23     EKG Interpretation   Date/Time:  Wednesday April 12 2015 15:26:51 EDT Ventricular Rate:  65 PR Interval:  150 QRS Duration: 97 QT Interval:  423 QTC Calculation: 440 R Axis:   40 Text Interpretation:  Sinus rhythm Abnormal R-wave progression, early  transition since last tracing no significant change Confirmed by MILLER   MD, BRIAN ([id number]) on 04/12/2015 3:44:24 PM      MDM   Final diagnoses:  Seizure   Labs: I-STAT Chem-8, ethanol, i-STAT troponin, CBC, BMP. PT/INR, APTT  Imaging: CT head without contrast- no acute intracranial abnormalities  Consults: Neurology Dr. Roseanne Reno  Therapeutics: Dilaudid, Keppra  Discharge Meds:   Assessment/Plan: Patient presents with gross right-sided neurological  deficits and chest pain. Patient has a significant past medical chest pain, did not uses nitroglycerin today. He was given nitroglycerin here in the ED with complete symptom resolution. He does report some chest tenderness to palpation, no baseline chest pain. Neurology was consult at due to questionable code stroke, there are interpretation this patient may be having a seizure, they would like patient admitted to the inpatient for further evaluation and  management. CT scan here in the ED showed no acute findings, hospital service was consult at for inpatient management, Dr. Jomarie Longs agreed to accept. Patient remained stable here in the ED.  Patient's care was shared with Dr. Eber Hong who personally evaluated the patient and agreed to my assessment and plan   4:47 PM Spoke to neurology regarding pt's symptoms, do to patients non resolving symptoms code stoke was called     Eyvonne Mechanic, PA-C 04/12/15 1647  Eber Hong, MD 04/12/15 [phone]

## 2015-04-12 NOTE — Code Documentation (Signed)
64yo male arriving to [facility] via [facility] at [facility] with c/o chest pain and neck pain while cleaning out his vacuum cleaner.  Patient reports becoming sweaty and having dizziness with generalized weakness then was unable to use his right hand and felt disoriented.  Of note, patient reports that he has 2-3 similar episodes a week and sees a neurologist for this.  He reports he was diagnosed with TIAs.  Code stroke activated in the ED.  Patient to CT.  Stroke team to the bedside.  NIHSS 3, see documentation for details and code stroke times.  Patient with RUE and RLE drift on exam and reports decreased sensation on the right side which is his baseline  Patient's wife is at bedside and reports patient has right sided weakness at baseline d/t stroke last year.  Wife reports episode today was worse than usual.  Dr. Roseanne Reno at the bedside.  Suspect seizure at this time.  Code stroke canceled at 1621.  Bedside handoff with ED RN Maralyn Sago.

## 2015-04-12 NOTE — Progress Notes (Signed)
EEG Completed; Results Pending  

## 2015-04-12 NOTE — ED Provider Notes (Signed)
The patient is a 64 year old male, presents to the hospital with family after having witnessed weakness on the right side of his body, twice a week he has the similar symptoms with associated dizziness, diaphoresis and difficulty speaking. According to the family members the patient does not remember these episodes. On exam the patient has right-sided weakness in both the arm and the leg as well as right-sided facial droop, he is able to speak very clearly, follows commands without difficulty.  Discussed this with the neurologist, Dr. Roseanne Reno, he recommends admission to the hospital as he believes the patient is having ongoing seizure activity. CT scan of the head is negative, this was discussed with the radiologist. MRI will be ordered by neurology, labs pending, discussed with hospitalist for admission. Loaded with Keppra.  Medical screening examination/treatment/procedure(s) were conducted as a shared visit with non-physician practitioner(s) and myself.  I personally evaluated the patient during the encounter.  Clinical Impression:   Final diagnoses:  Seizure     EKG Interpretation  Date/Time:  Wednesday April 12 2015 15:26:51 EDT Ventricular Rate:  65 PR Interval:  150 QRS Duration: 97 QT Interval:  423 QTC Calculation: 440 R Axis:   40 Text Interpretation:  Sinus rhythm Abnormal R-wave progression, early transition since last tracing no significant change Confirmed by Hyacinth Meeker  MD, Hisham Provence ([id number]) on 04/12/2015 3:44:24 PM          Eber Hong, MD 04/12/15 6045

## 2015-04-12 NOTE — Procedures (Signed)
EEG report.  Brief clinical history:  64 y.o. male presenting to hospital with acute onset of diaphoresis and AMS which is improving. Patient has been experiencing similar spells at least twice a week since his CVA. Description patient's recurrent pattern of symptoms, of which the patient likely has no memory, is consistent with complex partial seizure disorder.  Technique: this is a 17 channel routine scalp EEG performed at the bedside with bipolar and monopolar montages arranged in accordance to the international 10/20 system of electrode placement. One channel was dedicated to EKG recording.  Patient remains awake during the study. No activating procedures performed.  Description: the study is obscured by significant amount of artifacts, but in the wakeful state, the best background consisted of a low amplitude, posterior dominant, well sustained, symmetric and reactive  rhythm. No focal or generalized epileptiform discharges noted.  No pathologic areas of slowing seen.  EKG showed sinus rhythm.  Impression: this is a normal awake EEG. Please, be aware that a normal EEG does not exclude the possibility of epilepsy.  Clinical correlation is advised.   Wyatt Portela, MD

## 2015-04-12 NOTE — ED Notes (Signed)
EEG at bedside with patient 

## 2015-04-13 ENCOUNTER — Inpatient Hospital Stay (HOSPITAL_COMMUNITY): Payer: Medicare HMO

## 2015-04-13 DIAGNOSIS — Z8673 Personal history of transient ischemic attack (TIA), and cerebral infarction without residual deficits: Secondary | ICD-10-CM

## 2015-04-13 DIAGNOSIS — G894 Chronic pain syndrome: Secondary | ICD-10-CM

## 2015-04-13 LAB — CBC
HCT: 40.2 % (ref 39.0–52.0)
Hemoglobin: 14 g/dL (ref 13.0–17.0)
MCH: 30 pg (ref 26.0–34.0)
MCHC: 34.8 g/dL (ref 30.0–36.0)
MCV: 86.1 fL (ref 78.0–100.0)
PLATELETS: 170 10*3/uL (ref 150–400)
RBC: 4.67 MIL/uL (ref 4.22–5.81)
RDW: 13.9 % (ref 11.5–15.5)
WBC: 7.2 10*3/uL (ref 4.0–10.5)

## 2015-04-13 LAB — COMPREHENSIVE METABOLIC PANEL
ALBUMIN: 3.5 g/dL (ref 3.5–5.0)
ALK PHOS: 65 U/L (ref 38–126)
ALT: 21 U/L (ref 17–63)
ANION GAP: 8 (ref 5–15)
AST: 26 U/L (ref 15–41)
BILIRUBIN TOTAL: 0.8 mg/dL (ref 0.3–1.2)
BUN: 7 mg/dL (ref 6–20)
CHLORIDE: 101 mmol/L (ref 101–111)
CO2: 28 mmol/L (ref 22–32)
Calcium: 8.9 mg/dL (ref 8.9–10.3)
Creatinine, Ser: 1.07 mg/dL (ref 0.61–1.24)
GFR calc Af Amer: >60 mL/min (ref >60)
Glucose, Bld: 134 mg/dL — ABNORMAL HIGH (ref 65–99)
POTASSIUM: 3.8 mmol/L (ref 3.5–5.1)
Sodium: 137 mmol/L (ref 135–145)
Total Protein: 6.3 g/dL — ABNORMAL LOW (ref 6.5–8.1)

## 2015-04-13 MED ORDER — LEVETIRACETAM 500 MG PO TABS: 500.0000 mg | ORAL_TABLET | Freq: Two times a day (BID) | ORAL | Status: DC

## 2015-04-13 NOTE — Evaluation (Signed)
Physical Therapy Evaluation Patient Details Name: Justin Wells MRN: [id number] DOB: 23-Oct-1950 Today's Date: 04/13/2015   History of Present Illness  64 yo male admitted with AMS, ? seizure. Hx of CVA with residual R sided weakness, HTN, MI, CAD, chronic neck/back pain.   Clinical Impression  On eval, pt was Min guard assist for mobility-walked ~200 feet. Distance limited by plantar fasciitis pain in L foot. Unsteady at times. Encouraged pt to use cane (pt already has one). Do not anticipate any follow up PT needs.     Follow Up Recommendations No PT follow up    Equipment Recommendations  None recommended by PT    Recommendations for Other Services       Precautions / Restrictions Precautions Precautions: Fall Restrictions Weight Bearing Restrictions: No      Mobility  Bed Mobility Overal bed mobility: Modified Independent                Transfers Overall transfer level: Needs assistance   Transfers: Sit to/from Stand Sit to Stand: Supervision         General transfer comment: for safety. unsteady with initial standing  Ambulation/Gait Ambulation/Gait assistance: Min guard Ambulation Distance (Feet): 200 Feet Assistive device: None Gait Pattern/deviations: Step-to pattern;Antalgic;Decreased stance time - left     General Gait Details: unsteady at times. gait antalgic due to plantar fasciitis L foot.   Stairs            Wheelchair Mobility    Modified Rankin (Stroke Patients Only)       Balance Overall balance assessment: Needs assistance         Standing balance support: During functional activity Standing balance-Leahy Scale: Fair                               Pertinent Vitals/Pain Pain Assessment: 0-10 Pain Location: L shoulder, back-chronic Pain Descriptors / Indicators: Aching;Sore Pain Intervention(s): Monitored during session    Home Living Family/patient expects to be discharged to:: Private  residence Living Arrangements: Spouse/significant other Available Help at Discharge: Family;Available 24 hours/day Type of Home: House Home Access: Stairs to enter Entrance Stairs-Rails: Right Entrance Stairs-Number of Steps: 3 Home Layout: One level Home Equipment: Cane - single point      Prior Function Level of Independence: Independent with assistive device(s)         Comments: uses cane as needed     Hand Dominance        Extremity/Trunk Assessment   Upper Extremity Assessment: RUE deficits/detail;Generalized weakness RUE Deficits / Details: residual R sided weakness         Lower Extremity Assessment: RLE deficits/detail;LLE deficits/detail RLE Deficits / Details: residual R sided weakness.  LLE Deficits / Details: pt reports plantar fasciitis     Communication   Communication: No difficulties  Cognition Arousal/Alertness: Awake/alert Behavior During Therapy: WFL for tasks assessed/performed Overall Cognitive Status: Within Functional Limits for tasks assessed                      General Comments      Exercises        Assessment/Plan    PT Assessment    PT Diagnosis Difficulty walking;Acute pain   PT Problem List    PT Treatment Interventions     PT Goals (Current goals can be found in the Care Plan section) Acute Rehab PT Goals Patient Stated Goal: home soon PT Goal  Formulation: With patient/family Time For Goal Achievement: 04/27/15 Potential to Achieve Goals: Good    Frequency     Barriers to discharge        Co-evaluation               End of Session Equipment Utilized During Treatment: Gait belt Activity Tolerance: Patient limited by pain Patient left: in bed;with call bell/phone within reach;with family/visitor present           Time: 9604-5409 PT Time Calculation (min) (ACUTE ONLY): 13 min   Charges:   PT Evaluation $Initial PT Evaluation Tier I: 1 Procedure     PT G Codes:        Rebeca Alert,  MPT Pager: [phone]

## 2015-04-13 NOTE — Discharge Summary (Signed)
Physician Discharge Summary  Justin Wells NWG:[id number] DOB: 1951/02/22 DOA: 04/12/2015  PCP: Carmin Richmond, MD  Admit date: 04/12/2015 Discharge date: 04/13/2015  Time spent: 45 minutes  Recommendations for Outpatient Follow-up:  1. Dr. Earlene Plater in 1 week, please consider cutting down/weaning narcotics 2. [facility] in 3-4weeks  Discharge Diagnoses:    Suspected partial seizure   Chronic pain syndrome   Anxiety   Decreased responsiveness   Ho CAD   H/o CVA   Discharge Condition: stable  Diet recommendation: heart healthy  Filed Weights   04/12/15 1531 04/12/15 1901  Weight: 93.895 kg (207 lb) 90.493 kg (199 lb 8 oz)    History of present illness:  Chief Complaint: Code stroke HPI: Justin Wells is a 64 y.o. male Justin Wells is an 64 y.o. male with a past medical history significant for HTN, hyperlipidemia, CAD s/p stent deployment x 3 in 2009, PAD s/p femoral stenting, TIA's, ischemic stroke in the setting of acute MI 2014, recurrent admissions for stroke like symptoms, with negative workup, anxiety, chronic neck/shoulder pain, he was in his usual state of health till 8/10 morning. According to his wife he was cleaning the Vaccuum cleaner and then he walked back into the house and the doorway she noticed that he was, shaky, dizzy and when she asked him what happened he said "I think him having another episode" and then he subsequently slumped down to one side. Subsequently his wife checked his blood sugar this was 67 she gave him some [facility] and according to the wife and was slumping over to R side then subsequently started becoming less responsive he was still mumbling and answering some questions but more drowsy. Wife also reports that he was started on OxyContin 2 weeks ago for his chronic neck pain, and oral Dilaudid was added last week. In addition he also reports ongoing chronic neck back and shoulder pain.  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 -EEG unremarkable, MRI brain WNL -started on keppra yesterday, mentation back to normal and no further episodes, discharged home on Keppra per Neurology recommendations and advised to FU with Neurology in 3 weeks -Advised :No driving, operating heavy machinery, perform activities at heights, swimming or participation in water activities for 6 months or until release by outpatient physician   Anxiety state  -continue Klonopin PRN   Chronic pain syndrome  -continue MS contin and Po dilaudid per home regimen  -d/w wife risks with narcotic dependence, would recommend to PCP to consider weaning controlled substances due to addiction potential and already requiring other CNS depressants etc   H/o CAD and stents in 2009,   -repeat Cath in 7/15 with patent stents, medical therapy recommended  -troponin negative   HTN -stable    PAD -s/p Femoral artery stent in 5/14 -continue plavix   Consultations:  Neurology Dr.Stewart  Discharge Exam: Filed Vitals:   04/13/15 1355  BP: 116/68  Pulse: 63  Temp: 98.3 F (36.8 C)  Resp: 18    General: AAOx3 Cardiovascular: S1S2/RRR Respiratory: CTAB  Discharge Instructions   Discharge Instructions    Diet - low sodium heart healthy    Complete by:  As directed      Discharge instructions    Complete by:  As directed   No driving, operating heavy machinery, perform activities at heights,  swimming or participation in water activities for 6 months or until release by outpatient physician     Increase activity slowly    Complete by:  As directed           Current Discharge Medication List    START taking these medications   Details  levETIRAcetam (KEPPRA) 500 MG tablet Take 1 tablet (500 mg total) by mouth 2 (two) times  daily. Qty: 60 tablet, Refills: 0      CONTINUE these medications which have NOT CHANGED   Details  aspirin EC 81 MG tablet Take 81 mg by mouth daily.    clonazePAM (KLONOPIN) 0.5 MG tablet Take 0.5 tablets (0.25 mg total) by mouth 2 (two) times daily. Qty: 60 tablet, Refills: 0    clopidogrel (PLAVIX) 75 MG tablet TAKE ONE TABLET BY MOUTH ONCE DAILY Qty: 30 tablet, Refills: 7    esomeprazole (NEXIUM) 40 MG capsule Take 40 mg by mouth at bedtime.     fish oil-omega-3 fatty acids 1000 MG capsule Take 1 g by mouth 2 (two) times daily.     HYDROmorphone (DILAUDID) 4 MG tablet Take 4 mg by mouth every 4 (four) hours as needed (for breakthrough pain).     lisinopril (PRINIVIL,ZESTRIL) 5 MG tablet Take 1 tablet (5 mg total) by mouth daily. Qty: 30 tablet, Refills: 1    morphine (MS CONTIN) 15 MG 12 hr tablet Take 15 mg by mouth 2 (two) times daily.    Multiple Vitamins-Minerals (ONE-A-DAY MENS VITACRAVES PO) Take 1 tablet by mouth daily.     nitroGLYCERIN (NITROSTAT) 0.4 MG SL tablet Place 0.4 mg under the tongue every 5 (five) minutes as needed for chest pain.    PARoxetine (PAXIL) 20 MG tablet Take 1 tablet (20 mg total) by mouth every morning. Qty: 30 tablet, Refills: 1    tamsulosin (FLOMAX) 0.4 MG CAPS capsule Take 0.4 mg by mouth at bedtime.    atorvastatin (LIPITOR) 80 MG tablet Take 1 tablet (80 mg total) by mouth daily at 6 (six) AM. Qty: 30 tablet, Refills: 0    cyclobenzaprine (FLEXERIL) 10 MG tablet Take 0.5 tablets (5 mg total) by mouth 2 (two) times daily as needed for muscle spasms. Qty: 60 tablet, Refills: 5       No Known Allergies    The results of significant diagnostics from this hospitalization (including imaging, microbiology, ancillary and laboratory) are listed below for reference.    Significant Diagnostic Studies: Ct Head Wo Contrast  04/12/2015   CLINICAL DATA:  RIGHT-sided weakness and facial droop, stroke like symptoms, history of stroke,  coronary artery disease, peripheral arterial disease, hyperlipidemia, hypertension, MI, former smoker, seizures  EXAM: CT HEAD WITHOUT CONTRAST  TECHNIQUE: Contiguous axial images were obtained from the base of the skull through the vertex without intravenous contrast.  COMPARISON:  08/21/2014  FINDINGS: Normal ventricular morphology.  No midline shift or mass effect.  Normal appearance of brain parenchyma.  No intracranial hemorrhage, mass lesion, or acute infarction.  Visualized paranasal sinuses and mastoid air cells clear.  Bones unremarkable.  Mild atherosclerotic calcification at the carotid siphons.  IMPRESSION: No acute intracranial abnormalities.  Findings called to Dr. Roseanne Reno and Dr. Hyacinth Meeker on 04/12/2015 at 1622 hours.   Electronically Signed   By: Ulyses Southward M.D.   On: 04/12/2015 16:23   Mr Brain Wo Contrast  04/13/2015   CLINICAL DATA:  64 year old male with acute onset altered mental status and diaphoresis. Similar episodes in the past. Possible seizure,  reportedly negative EEG. Initial encounter.  EXAM: MRI HEAD WITHOUT CONTRAST  TECHNIQUE: Multiplanar, multiecho pulse sequences of the brain and surrounding structures were obtained without intravenous contrast.  COMPARISON:  Head CT 04/12/2015 and earlier. Brain MRI 03/07/2014 and earlier.  FINDINGS: Major intracranial vascular flow voids are stable and within normal limits (basilar artery tip and PCA flow voids best demonstrated on thin coronal images series 9). Stable cerebral volume since 2015.  No restricted diffusion to suggest acute infarction. No midline shift, mass effect, evidence of mass lesion, ventriculomegaly, extra-axial collection or acute intracranial hemorrhage. Cervicomedullary junction and pituitary are within normal limits. Negative visualized cervical spine.  Wallace Cullens and white matter signal throughout the brain appears stable and largely unremarkable for age ; minimal nonspecific white matter changes. Mesial temporal lobe  structures appear within normal limits on thin slice coronal imaging. No chronic cerebral blood products or cortical encephalomalacia identified.  Visible internal auditory structures appear normal. Mastoids remain clear. Mild paranasal sinus mucosal thickening has not significantly changed. Orbit and scalp soft tissues appear normal. Normal bone marrow signal.  IMPRESSION: Noncontrast MRI appearance of the brain is stable since 2015 and largely unremarkable for age. No acute intracranial abnormality.   Electronically Signed   By: Odessa Fleming M.D.   On: 04/13/2015 10:37    Microbiology: No results found for this or any previous visit (from the past 240 hour(s)).   Labs: Basic Metabolic Panel:  Recent Labs Lab 04/12/15 1540 04/12/15 1605 04/13/15 0610  NA 136 136 137  K 3.7 3.8 3.8  CL 101 100* 101  CO2 26  --  28  GLUCOSE 81 77 134*  BUN 7 9 7   CREATININE 1.04 1.00 1.07  CALCIUM 8.9  --  8.9   Liver Function Tests:  Recent Labs Lab 04/13/15 0610  AST 26  ALT 21  ALKPHOS 65  BILITOT 0.8  PROT 6.3*  ALBUMIN 3.5   No results for input(s): LIPASE, AMYLASE in the last 168 hours. No results for input(s): AMMONIA in the last 168 hours. CBC:  Recent Labs Lab 04/12/15 1540 04/12/15 1605 04/13/15 0610  WBC 10.2  --  7.2  NEUTROABS 7.7  --   --   HGB 13.9 13.6 14.0  HCT 39.4 40.0 40.2  MCV 85.5  --  86.1  PLT 182  --  170   Cardiac Enzymes:  Recent Labs Lab 04/12/15 1811  TROPONINI <0.03   BNP: BNP (last 3 results) No results for input(s): BNP in the last 8760 hours.  ProBNP (last 3 results) No results for input(s): PROBNP in the last 8760 hours.  CBG: No results for input(s): GLUCAP in the last 168 hours.     SignedZannie Cove  [facility] 04/13/2015, 2:21 PM

## 2015-04-13 NOTE — Progress Notes (Signed)
Pt DC home with spouse. He was given prescription, education, and instructions. Transferred out via [facility] with tech.

## 2015-04-13 NOTE — Progress Notes (Signed)
Subjective:   Objective: Current vital signs: BP 127/72 mmHg  Pulse 61  Temp(Src) 97.7 F (36.5 C) (Oral)  Resp 18  Ht  (1.778 m)  Wt 90.493 kg (199 lb 8 oz)  BMI 28.63 kg/m2  SpO2 99% Vital signs in last 24 hours: Temp:  [97.7 F (36.5 C)-98.2 F (36.8 C)] 97.7 F (36.5 C) (08/11 0535) Pulse Rate:  [61-71] 61 (08/11 0535) Resp:  [15-20] 18 (08/11 0535) BP: (117-137)/(64-98) 127/72 mmHg (08/11 0535) SpO2:  [93 %-99 %] 99 % (08/11 0535) Weight:  [90.493 kg (199 lb 8 oz)-93.895 kg (207 lb)] 90.493 kg (199 lb 8 oz) (08/10 1901)  Intake/Output from previous day: 08/10 0701 - 08/11 0700 In: 345 [P.O.:240; IV Piggyback:105] Out: 800 [Urine:800] Intake/Output this shift: Total I/O In: 450 [P.O.:450] Out: -  Nutritional status: Diet Heart Room service appropriate?: Yes; Fluid consistency:: Thin  Neurologic Exam: General: Mental Status: Alert, oriented, thought content appropriate.  Speech fluent without evidence of aphasia.  Able to follow 3 step commands without difficulty. Cranial Nerves: II: Visual fields grossly normal, pupils equal, round, reactive to light and accommodation III,IV, VI: ptosis not present, extra-ocular motions intact bilaterally V,VII: smile symmetric right NL fold decrease, facial light touch sensation decreased on the right VIII: hearing normal bilaterally IX,X: uvula rises symmetrically XI: bilateral shoulder shrug XII: midline tongue extension without atrophy or fasciculations  Motor: Right : Upper extremity   4/5    Left:     Upper extremity   5/5  Lower extremity   4/5     Lower extremity   5/5 Tone and bulk:normal tone throughout; no atrophy noted Sensory: Pinprick and light touch decreased on the right Deep Tendon Reflexes:  Right: Upper Extremity   Left: Upper extremity   biceps (C-5 to C-6) 2/4   biceps (C-5 to C-6) 2/4 tricep (C7) 2/4    triceps (C7) 2/4 Brachioradialis (C6) 2/4  Brachioradialis (C6) 2/4  Lower Extremity Lower  Extremity  quadriceps (L-2 to L-4) 2/4   quadriceps (L-2 to L-4) 2/4 Achilles (S1) 2/4   Achilles (S1) 2/4  Plantars: Right: downgoing   Left: downgoing    Lab Results: Basic Metabolic Panel:  Recent Labs Lab 04/12/15 1540 04/12/15 1605 04/13/15 0610  NA 136 136 137  K 3.7 3.8 3.8  CL 101 100* 101  CO2 26  --  28  GLUCOSE 81 77 134*  BUN CREATININE 1.04 1.00 1.07  CALCIUM 8.9  --  8.9    Liver Function Tests:  Recent Labs Lab 04/13/15 0610  AST 26  ALT 21  ALKPHOS 65  BILITOT 0.8  PROT 6.3*  ALBUMIN 3.5   No results for input(s): LIPASE, AMYLASE in the last 168 hours. No results for input(s): AMMONIA in the last 168 hours.  CBC:  Recent Labs Lab 04/12/15 1540 04/12/15 1605 04/13/15 0610  WBC 10.2  --  7.2  NEUTROABS 7.7  --   --   HGB 13.9 13.6 14.0  HCT 39.4 40.0 40.2  MCV 85.5  --  86.1  PLT 182  --  170    Cardiac Enzymes:  Recent Labs Lab 04/12/15 1811  TROPONINI <0.03    Lipid Panel: No results for input(s): CHOL, TRIG, HDL, CHOLHDL, VLDL, LDLCALC in the last 168 hours.  CBG: No results for input(s): GLUCAP in the last 168 hours.  Microbiology: Results for orders placed or performed during the hospital encounter of 12/27/13  MRSA PCR Screening  Status: None   Collection Time: 12/27/13  6:32 PM  Result Value Ref Range Status   MRSA by PCR NEGATIVE NEGATIVE Final    Comment:        The GeneXpert MRSA Assay (FDA approved for NASAL specimens only), is one component of a comprehensive MRSA colonization surveillance program. It is not intended to diagnose MRSA infection nor to guide or monitor treatment for MRSA infections.    Coagulation Studies:  Recent Labs  04/12/15 1540  LABPROT 14.4  INR 1.10    Imaging: Ct Head Wo Contrast  04/12/2015   CLINICAL DATA:  RIGHT-sided weakness and facial droop, stroke like symptoms, history of stroke, coronary artery disease, peripheral arterial disease, hyperlipidemia,  hypertension, MI, former smoker, seizures  EXAM: CT HEAD WITHOUT CONTRAST  TECHNIQUE: Contiguous axial images were obtained from the base of the skull through the vertex without intravenous contrast.  COMPARISON:  08/21/2014  FINDINGS: Normal ventricular morphology.  No midline shift or mass effect.  Normal appearance of brain parenchyma.  No intracranial hemorrhage, mass lesion, or acute infarction.  Visualized paranasal sinuses and mastoid air cells clear.  Bones unremarkable.  Mild atherosclerotic calcification at the carotid siphons.  IMPRESSION: No acute intracranial abnormalities.  Findings called to Dr. Roseanne Reno and Dr. Hyacinth Meeker on 04/12/2015 at 1622 hours.   Electronically Signed   By: Ulyses Southward M.D.   On: 04/12/2015 16:23    Medications:  Scheduled: . aspirin EC  81 mg Oral Daily  . clopidogrel  75 mg Oral Daily  . enoxaparin (LOVENOX) injection  40 mg Subcutaneous Q24H  . levETIRAcetam  500 mg Intravenous Q12H  . morphine  15 mg Oral BID  . pantoprazole  40 mg Oral Daily  . PARoxetine  20 mg Oral [id number]  . tamsulosin  0.4 mg Oral QHS    EEG: Impression: this is a normal awake EEG. Please, be aware that a normal EEG does not exclude the possibility of epilepsy.  Clinical correlation is advised  Assessment/Plan: 64 y.o. male with a history of stroke and recurrent changes in mental status and transient exacerbation of right-sided weakness, consistent with partial seizure disorder. Patient is doing well at this point with no recurrence of seizure activity since admission and return of mental status to baseline.  If MRI is negative would recommend he stay on Keppra 500 mg BID PO and follow up out patient with neurology  No driving, operating heavy machinery, perform activities at heights, swimming or participation in water activities for 6 months or  until release by outpatient physician.  This has been discussed with patient.   Felicie Morn PA-C [facility] [phone]  04/13/2015, 9:16 AM  I personally participated in this patient's evaluation and management, including formulating above clinical impression and management recommendations.  Venetia Maxon M.D. [facility] [phone]

## 2015-07-20 DIAGNOSIS — K22719 Barrett's esophagus with dysplasia, unspecified: Secondary | ICD-10-CM | POA: Insufficient documentation

## 2015-08-16 ENCOUNTER — Other Ambulatory Visit: Payer: Self-pay | Admitting: Surgery

## 2015-08-23 IMAGING — CT CT HEAD W/O CM
1 series · 16 of 30 positions shown, 20 images · non-contrast
Comparison: 08/21/2014

CLINICAL DATA: RIGHT-sided weakness and facial droop, stroke like
symptoms, history of stroke, coronary artery disease, peripheral
arterial disease, hyperlipidemia, hypertension, MI, former smoker,
seizures

EXAM:
CT HEAD WITHOUT CONTRAST
TECHNIQUE: Contiguous axial images were obtained from the base of the skull
through the vertex without intravenous contrast.

[Series 2: head 5.0 h30s · axial · 0.47mm/px · z∈[-92,+63]mm · 16 of 35 slices shown, 20 images]
[im 2/35  brain]
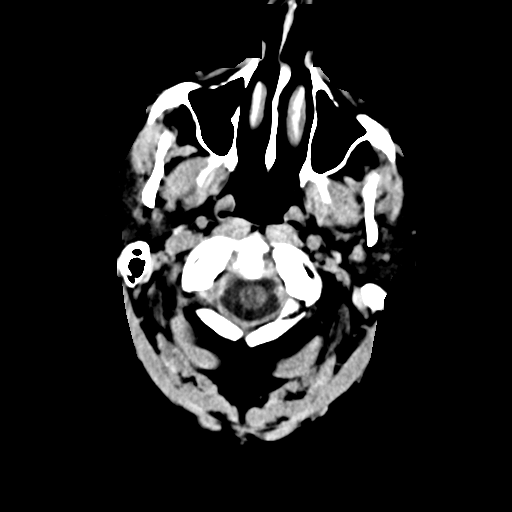
[im 2/35  bone]
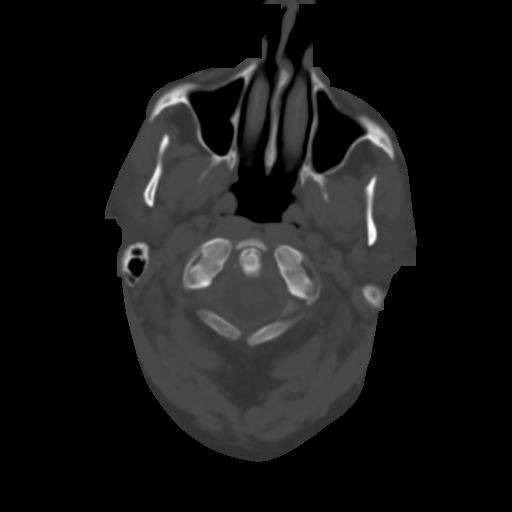
[im 4/35  brain]
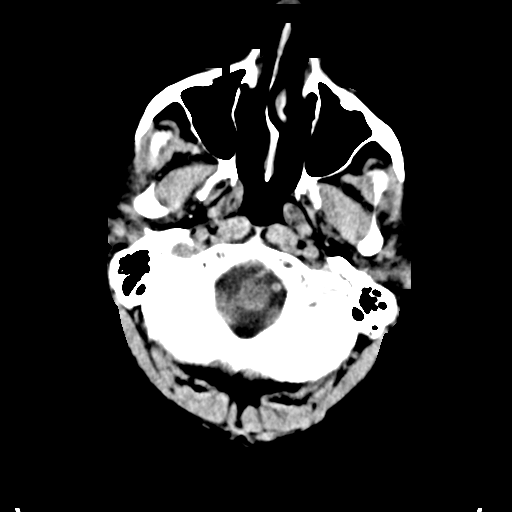
[im 6/35  brain]
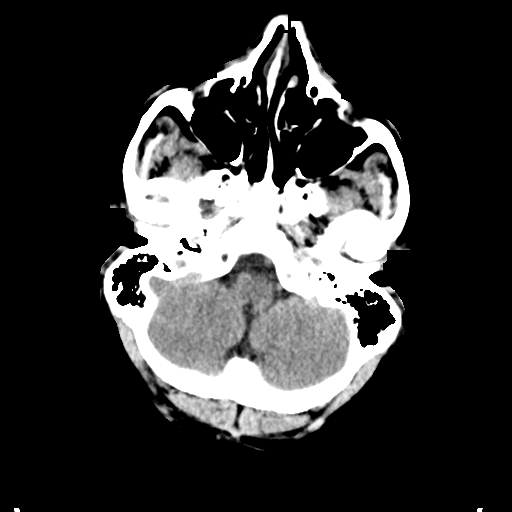
[im 9/35  brain]
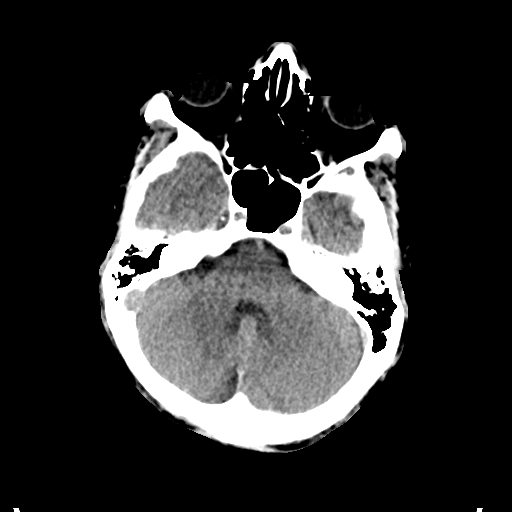
[im 10/35  brain]
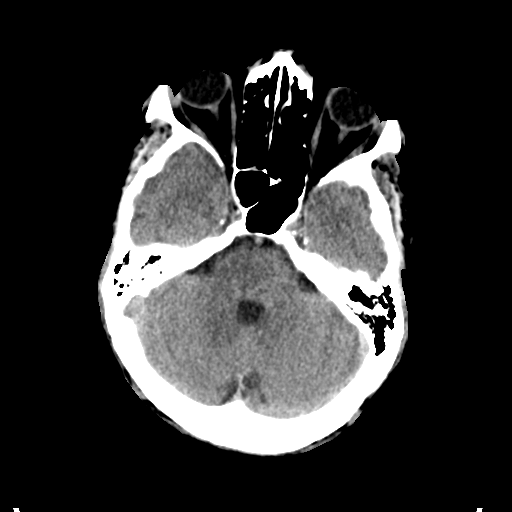
[im 10/35  bone]
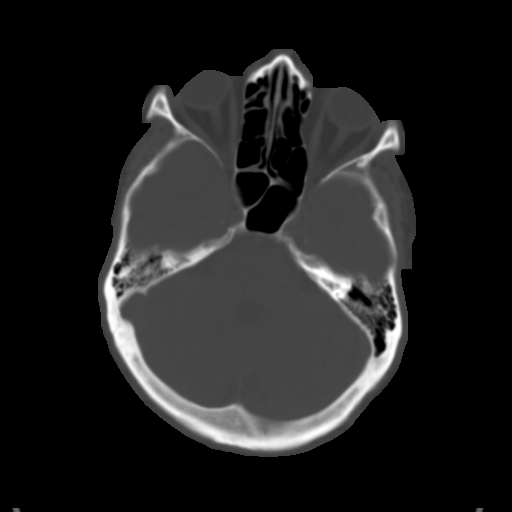
[im 12/35  brain]
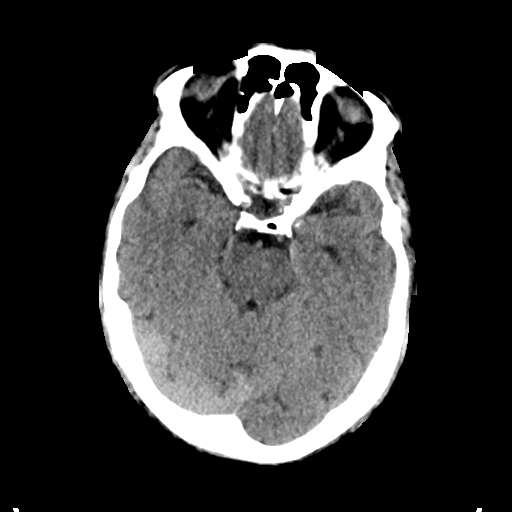
[im 15/35  brain]
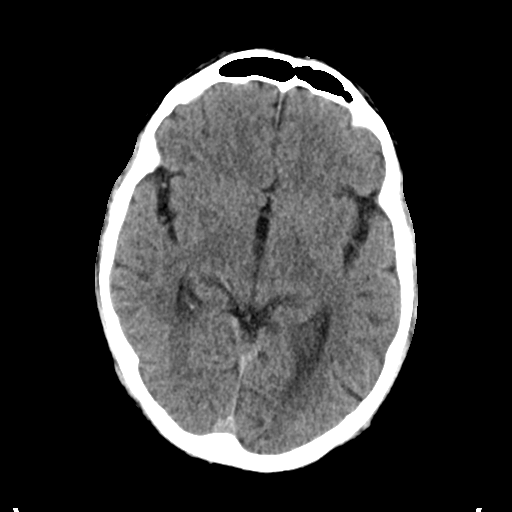
[im 17/35  brain]
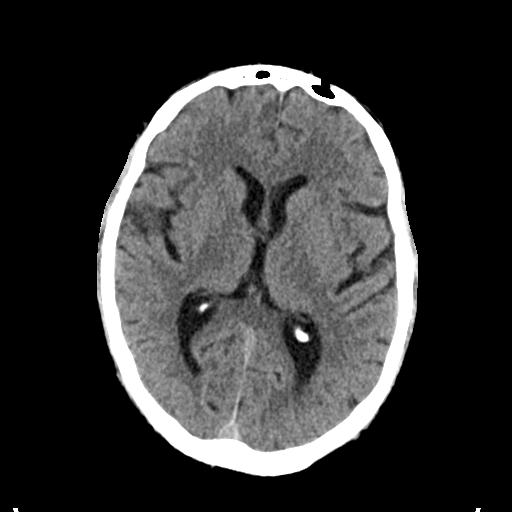
[im 18/35  brain]
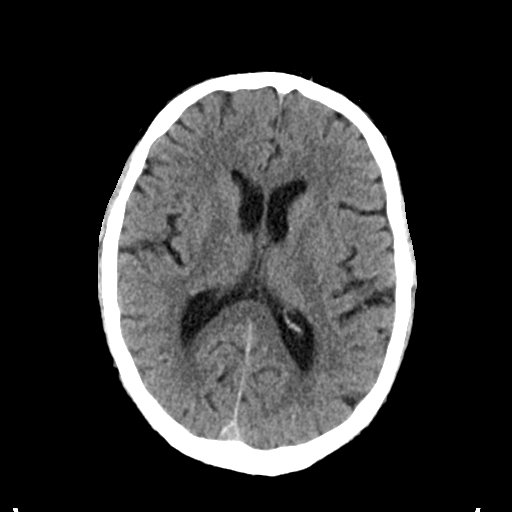
[im 18/35  bone]
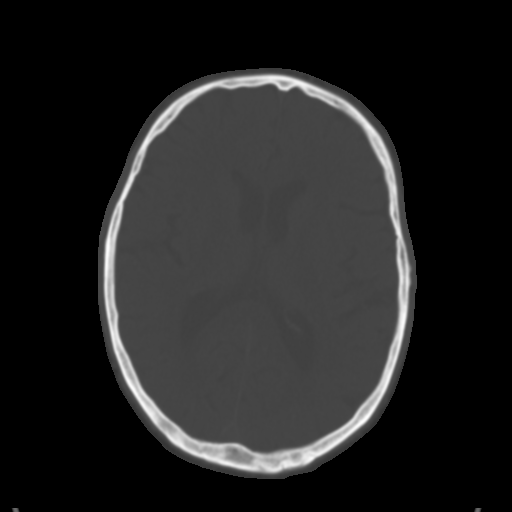
[im 20/35  brain]
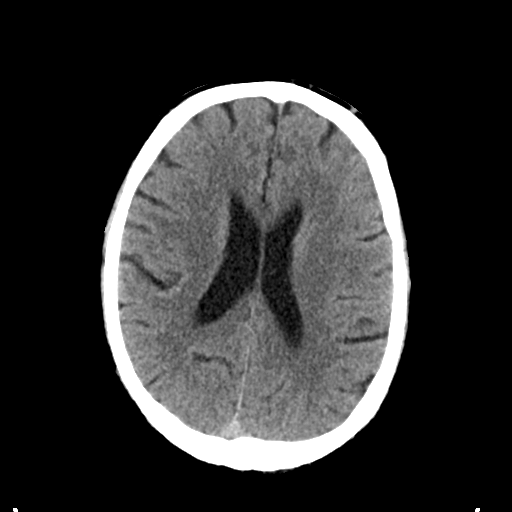
[im 23/35  brain]
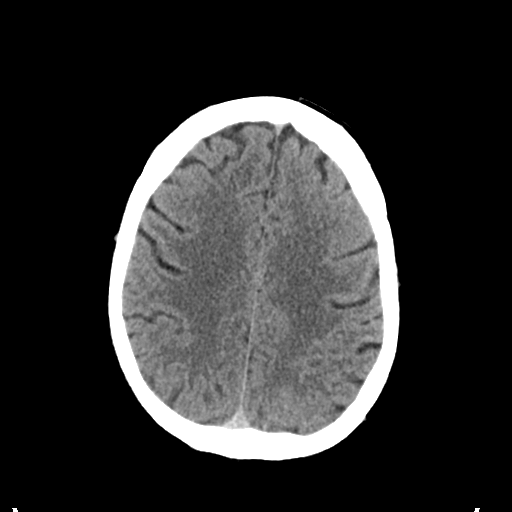
[im 25/35  brain]
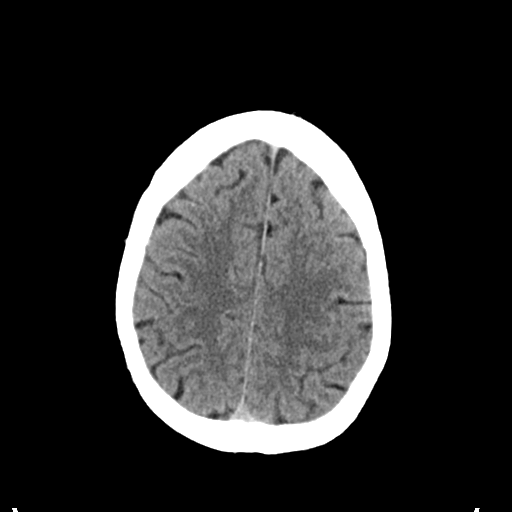
[im 26/35  brain]
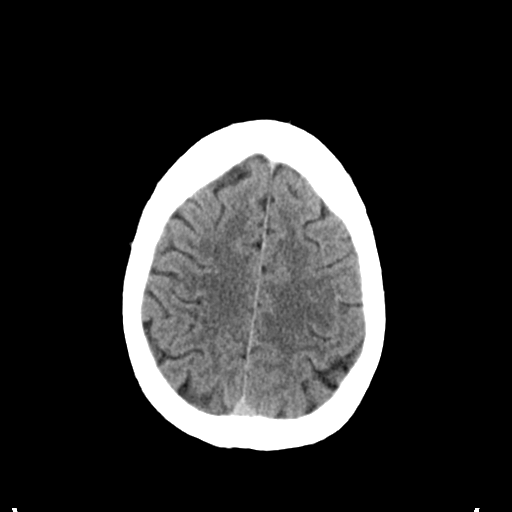
[im 26/35  bone]
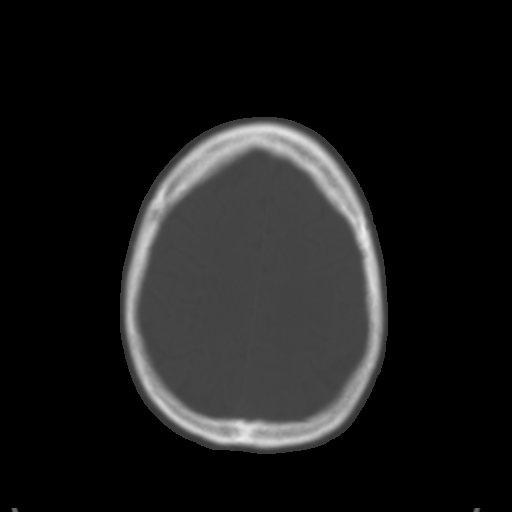
[im 29/35  brain]
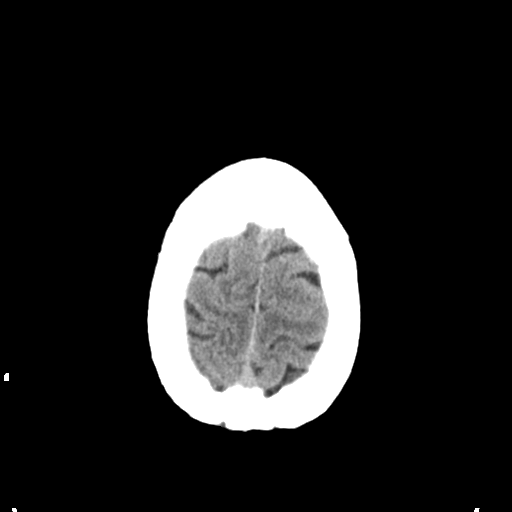
[im 31/35  brain]
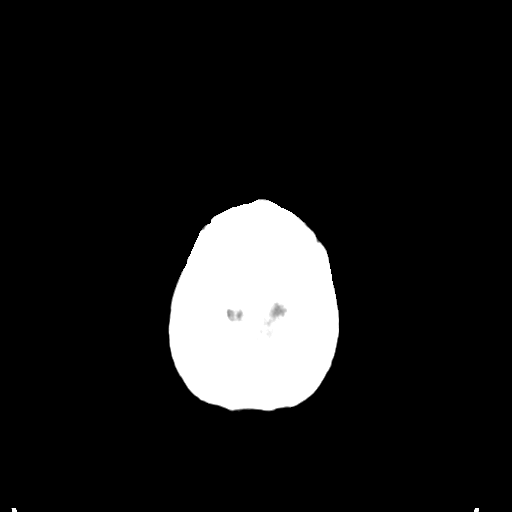
[im 33/35  brain]
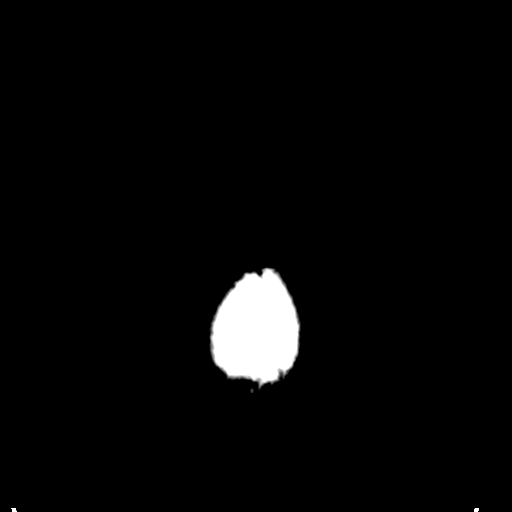

[16 of 30 positions shown; findings below may reference images not displayed]

FINDINGS: Normal ventricular morphology.

No midline shift or mass effect.

Normal appearance of brain parenchyma.

No intracranial hemorrhage, mass lesion, or acute infarction.

Visualized paranasal sinuses and mastoid air cells clear.

Bones unremarkable.

Mild atherosclerotic calcification at the carotid siphons.
IMPRESSION: No acute intracranial abnormalities.

Findings called to Dr. Kit Hang and Dr. Bafon on 04/12/2015 at 2288
hours.

## 2015-10-31 ENCOUNTER — Telehealth: Payer: Self-pay | Admitting: Neurology

## 2015-10-31 NOTE — Telephone Encounter (Signed)
I will see the patient during follow-up visit and decide if scan is necessary

## 2015-10-31 NOTE — Telephone Encounter (Signed)
Pt's wife called to make appt for the pt. First avail was May 10th. She is requesting a CT of head and neck before appt. Please call and advise [phone] may leave message.

## 2015-11-01 NOTE — Telephone Encounter (Signed)
Rn call patients wife [name] back about the Ct being order.Rn explain that Dr.[name] does not want to order a CT scan until the visit in May 2017.Patient was last seen 2015 for stroke follow up. Pts wife stated she has had cancer, death in the family, and its been a lot of things overall. Rn explain that on their appt they have to show the insurance card, and pay the co payment to have the visit. Pts wife wanted to know what would the co payment be. Rn told her to call the insurance company to find out. Pts wife verbalized understanding of the test not being order until visit. She will call back weekly to see if Dr.[name] has any cancellations.PTs wife stated her husband has chronic neck and back pain . Pts PCP is prescribing him dilaudid and ms contin.

## 2016-01-05 ENCOUNTER — Telehealth: Payer: Self-pay | Admitting: *Deleted

## 2016-01-05 NOTE — Telephone Encounter (Signed)
LVM requesting call back re: FU appointment next week. Left name, number, office hours.

## 2016-01-08 ENCOUNTER — Telehealth: Payer: Self-pay | Admitting: *Deleted

## 2016-01-08 NOTE — Telephone Encounter (Signed)
LVM requesting call back, re: discuss if he would agree to see Enid Skeens Martin NP on Wed for follow up. Advised Dr Pearlean BrownieSethi will be in office. Left name, number.

## 2016-01-10 ENCOUNTER — Ambulatory Visit: Payer: Medicare HMO | Admitting: Neurology

## 2016-04-18 ENCOUNTER — Observation Stay (HOSPITAL_COMMUNITY)
Admission: EM | Admit: 2016-04-18 | Discharge: 2016-04-21 | Disposition: A | Discharge status: Home / Self Care | Payer: Medicare HMO | Attending: Internal Medicine | Admitting: Internal Medicine

## 2016-04-18 ENCOUNTER — Emergency Department (HOSPITAL_COMMUNITY): Payer: Medicare HMO

## 2016-04-18 ENCOUNTER — Encounter (HOSPITAL_COMMUNITY): Payer: Self-pay

## 2016-04-18 DIAGNOSIS — I252 Old myocardial infarction: Secondary | ICD-10-CM | POA: Insufficient documentation

## 2016-04-18 DIAGNOSIS — R6889 Other general symptoms and signs: Secondary | ICD-10-CM

## 2016-04-18 DIAGNOSIS — R1013 Epigastric pain: Secondary | ICD-10-CM

## 2016-04-18 DIAGNOSIS — R0789 Other chest pain: Secondary | ICD-10-CM | POA: Diagnosis not present

## 2016-04-18 DIAGNOSIS — I10 Essential (primary) hypertension: Secondary | ICD-10-CM

## 2016-04-18 DIAGNOSIS — Z79899 Other long term (current) drug therapy: Secondary | ICD-10-CM | POA: Insufficient documentation

## 2016-04-18 DIAGNOSIS — Z8673 Personal history of transient ischemic attack (TIA), and cerebral infarction without residual deficits: Secondary | ICD-10-CM | POA: Diagnosis not present

## 2016-04-18 DIAGNOSIS — Z87891 Personal history of nicotine dependence: Secondary | ICD-10-CM | POA: Insufficient documentation

## 2016-04-18 DIAGNOSIS — R072 Precordial pain: Secondary | ICD-10-CM | POA: Diagnosis present

## 2016-04-18 DIAGNOSIS — G894 Chronic pain syndrome: Secondary | ICD-10-CM

## 2016-04-18 DIAGNOSIS — R079 Chest pain, unspecified: Secondary | ICD-10-CM | POA: Diagnosis not present

## 2016-04-18 DIAGNOSIS — R3915 Urgency of urination: Secondary | ICD-10-CM | POA: Diagnosis present

## 2016-04-18 DIAGNOSIS — Z8546 Personal history of malignant neoplasm of prostate: Secondary | ICD-10-CM | POA: Diagnosis not present

## 2016-04-18 DIAGNOSIS — R569 Unspecified convulsions: Secondary | ICD-10-CM

## 2016-04-18 DIAGNOSIS — F411 Generalized anxiety disorder: Secondary | ICD-10-CM | POA: Diagnosis not present

## 2016-04-18 DIAGNOSIS — M79602 Pain in left arm: Secondary | ICD-10-CM

## 2016-04-18 DIAGNOSIS — G40909 Epilepsy, unspecified, not intractable, without status epilepticus: Secondary | ICD-10-CM | POA: Insufficient documentation

## 2016-04-18 DIAGNOSIS — I2510 Atherosclerotic heart disease of native coronary artery without angina pectoris: Secondary | ICD-10-CM | POA: Diagnosis not present

## 2016-04-18 DIAGNOSIS — IMO0001 Reserved for inherently not codable concepts without codable children: Secondary | ICD-10-CM

## 2016-04-18 DIAGNOSIS — K8020 Calculus of gallbladder without cholecystitis without obstruction: Secondary | ICD-10-CM

## 2016-04-18 HISTORY — DX: Heart failure, unspecified: I50.9

## 2016-04-18 HISTORY — DX: Other chronic pain: G89.29

## 2016-04-18 HISTORY — DX: Major depressive disorder, single episode, unspecified: F32.9

## 2016-04-18 HISTORY — DX: Dorsalgia, unspecified: M54.9

## 2016-04-18 HISTORY — DX: Anxiety disorder, unspecified: F41.9

## 2016-04-18 HISTORY — DX: Depression, unspecified: F32.A

## 2016-04-18 LAB — COMPREHENSIVE METABOLIC PANEL
ALT: 20 U/L (ref 17–63)
ANION GAP: 7 (ref 5–15)
AST: 21 U/L (ref 15–41)
Albumin: 3.8 g/dL (ref 3.5–5.0)
Alkaline Phosphatase: 64 U/L (ref 38–126)
BUN: 8 mg/dL (ref 6–20)
CALCIUM: 8.7 mg/dL — AB (ref 8.9–10.3)
CHLORIDE: 105 mmol/L (ref 101–111)
CO2: 25 mmol/L (ref 22–32)
Creatinine, Ser: 0.94 mg/dL (ref 0.61–1.24)
GFR calc non Af Amer: >60 mL/min (ref >60)
Glucose, Bld: 102 mg/dL — ABNORMAL HIGH (ref 65–99)
Potassium: 3.5 mmol/L (ref 3.5–5.1)
Sodium: 137 mmol/L (ref 135–145)
Total Bilirubin: 0.6 mg/dL (ref 0.3–1.2)
Total Protein: 6 g/dL — ABNORMAL LOW (ref 6.5–8.1)

## 2016-04-18 LAB — CBC
HCT: 40.2 % (ref 39.0–52.0)
HEMATOCRIT: 41.2 % (ref 39.0–52.0)
HEMOGLOBIN: 13.8 g/dL (ref 13.0–17.0)
Hemoglobin: 13.5 g/dL (ref 13.0–17.0)
MCH: 29.2 pg (ref 26.0–34.0)
MCH: 29.7 pg (ref 26.0–34.0)
MCHC: 33.6 g/dL (ref 30.0–36.0)
MCHC: 33.5 g/dL (ref 30.0–36.0)
MCV: 86.8 fL (ref 78.0–100.0)
MCV: 88.6 fL (ref 78.0–100.0)
PLATELETS: 206 10*3/uL (ref 150–400)
Platelets: 208 10*3/uL (ref 150–400)
RBC: 4.63 MIL/uL (ref 4.22–5.81)
RBC: 4.65 MIL/uL (ref 4.22–5.81)
RDW: 13.2 % (ref 11.5–15.5)
RDW: 13.4 % (ref 11.5–15.5)
WBC: 9.4 10*3/uL (ref 4.0–10.5)
WBC: 8.1 10*3/uL (ref 4.0–10.5)

## 2016-04-18 LAB — LIPID PANEL
Cholesterol: 157 mg/dL (ref 0–200)
HDL: 39 mg/dL — ABNORMAL LOW (ref >40)
LDL Cholesterol: 97 mg/dL (ref 0–99)
Total CHOL/HDL Ratio: 4 RATIO
Triglycerides: 106 mg/dL (ref <150)
VLDL: 21 mg/dL (ref 0–40)

## 2016-04-18 LAB — CREATININE, SERUM
Creatinine, Ser: 1.08 mg/dL (ref 0.61–1.24)
GFR calc non Af Amer: >60 mL/min (ref >60)

## 2016-04-18 LAB — I-STAT TROPONIN, ED
TROPONIN I, POC: 0 ng/mL (ref 0.00–0.08)
TROPONIN I, POC: 0 ng/mL (ref 0.00–0.08)

## 2016-04-18 LAB — CBG MONITORING, ED: GLUCOSE-CAPILLARY: 92 mg/dL (ref 65–99)

## 2016-04-18 LAB — TROPONIN I: Troponin I: <0.03 ng/mL (ref <0.03)

## 2016-04-18 MED ORDER — NITROGLYCERIN 0.4 MG SL SUBL
0.4000 mg | SUBLINGUAL_TABLET | SUBLINGUAL | Status: DC | PRN
  Administered 2016-04-18 (×2): 0.4 mg via SUBLINGUAL
  Filled 2016-04-18: qty 1

## 2016-04-18 MED ORDER — ENOXAPARIN SODIUM 40 MG/0.4ML ~~LOC~~ SOLN
40.0000 mg | SUBCUTANEOUS | Status: DC
Start: 2016-04-18 — End: 2016-04-21
  Administered 2016-04-18 – 2016-04-20 (×4): 40 mg via SUBCUTANEOUS
  Filled 2016-04-18 (×4): qty 0.4

## 2016-04-18 MED ORDER — NITROGLYCERIN IN D5W 200-5 MCG/ML-% IV SOLN
0.0000 ug/min | INTRAVENOUS | Status: DC
  Administered 2016-04-18: 5 ug/min via INTRAVENOUS
  Filled 2016-04-18: qty 250

## 2016-04-18 MED ORDER — CLOPIDOGREL BISULFATE 75 MG PO TABS
75.0000 mg | ORAL_TABLET | Freq: Every day | ORAL | Status: DC
  Administered 2016-04-19 – 2016-04-21 (×3): 75 mg via ORAL
  Filled 2016-04-18 (×3): qty 1

## 2016-04-18 MED ORDER — ASPIRIN 81 MG PO CHEW: 324.0000 mg | CHEWABLE_TABLET | Freq: Once | ORAL | Status: DC

## 2016-04-18 MED ORDER — PAROXETINE HCL 20 MG PO TABS
20.0000 mg | ORAL_TABLET | ORAL | Status: DC
  Administered 2016-04-19 – 2016-04-21 (×3): 20 mg via ORAL
  Filled 2016-04-18 (×4): qty 1

## 2016-04-18 MED ORDER — GI COCKTAIL ~~LOC~~
30.0000 mL | Freq: Once | ORAL | Status: AC
  Administered 2016-04-18: 30 mL via ORAL
  Filled 2016-04-18: qty 30

## 2016-04-18 MED ORDER — LISINOPRIL 5 MG PO TABS
5.0000 mg | ORAL_TABLET | Freq: Every day | ORAL | Status: DC
  Administered 2016-04-19 – 2016-04-21 (×3): 5 mg via ORAL
  Filled 2016-04-18 (×3): qty 1

## 2016-04-18 MED ORDER — MORPHINE SULFATE (PF) 2 MG/ML IV SOLN
2.0000 mg | INTRAVENOUS | Status: DC | PRN
  Administered 2016-04-19 – 2016-04-21 (×11): 2 mg via INTRAVENOUS
  Filled 2016-04-18 (×11): qty 1

## 2016-04-18 MED ORDER — HYDROMORPHONE HCL 2 MG PO TABS
4.0000 mg | ORAL_TABLET | Freq: Four times a day (QID) | ORAL | Status: DC | PRN
  Administered 2016-04-19 – 2016-04-21 (×7): 4 mg via ORAL
  Filled 2016-04-18 (×7): qty 2

## 2016-04-18 MED ORDER — MORPHINE SULFATE (PF) 4 MG/ML IV SOLN
4.0000 mg | Freq: Once | INTRAVENOUS | Status: AC
  Administered 2016-04-18: 4 mg via INTRAVENOUS
  Filled 2016-04-18: qty 1

## 2016-04-18 MED ORDER — PANTOPRAZOLE SODIUM 40 MG PO TBEC
40.0000 mg | DELAYED_RELEASE_TABLET | Freq: Every day | ORAL | Status: DC
  Filled 2016-04-18: qty 1

## 2016-04-18 MED ORDER — ASPIRIN 81 MG PO CHEW
324.0000 mg | CHEWABLE_TABLET | Freq: Every day | ORAL | Status: DC
  Administered 2016-04-19 – 2016-04-21 (×3): 324 mg via ORAL
  Filled 2016-04-18 (×3): qty 4

## 2016-04-18 MED ORDER — NITROGLYCERIN 0.4 MG SL SUBL: 0.4000 mg | SUBLINGUAL_TABLET | SUBLINGUAL | Status: DC | PRN

## 2016-04-18 MED ORDER — LEVETIRACETAM 750 MG PO TABS
750.0000 mg | ORAL_TABLET | Freq: Two times a day (BID) | ORAL | Status: DC
  Administered 2016-04-18 – 2016-04-21 (×6): 750 mg via ORAL
  Filled 2016-04-18 (×6): qty 1

## 2016-04-18 MED ORDER — ONDANSETRON HCL 4 MG/2ML IJ SOLN: 4.0000 mg | Freq: Four times a day (QID) | INTRAMUSCULAR | Status: DC | PRN

## 2016-04-18 MED ORDER — GI COCKTAIL ~~LOC~~: 30.0000 mL | Freq: Four times a day (QID) | ORAL | Status: DC | PRN

## 2016-04-18 MED ORDER — CLONAZEPAM 0.5 MG PO TABS: 0.2500 mg | ORAL_TABLET | Freq: Two times a day (BID) | ORAL | Status: DC | PRN

## 2016-04-18 MED ORDER — MORPHINE SULFATE ER 15 MG PO TBCR
15.0000 mg | EXTENDED_RELEASE_TABLET | Freq: Two times a day (BID) | ORAL | Status: DC
  Administered 2016-04-18 – 2016-04-21 (×6): 15 mg via ORAL
  Filled 2016-04-18 (×6): qty 1

## 2016-04-18 MED ORDER — ENSURE ENLIVE PO LIQD
237.0000 mL | Freq: Two times a day (BID) | ORAL | Status: DC
  Administered 2016-04-19 – 2016-04-21 (×5): 237 mL via ORAL

## 2016-04-18 MED ORDER — ACETAMINOPHEN 325 MG PO TABS
650.0000 mg | ORAL_TABLET | ORAL | Status: DC | PRN
  Administered 2016-04-18 – 2016-04-20 (×4): 650 mg via ORAL
  Filled 2016-04-18 (×5): qty 2

## 2016-04-18 NOTE — ED Provider Notes (Addendum)
[facility]-EMERGENCY DEPT Provider Note   CSN: [id number] Arrival date & time: 04/18/16  1229     History   Chief Complaint Chief Complaint  Patient presents with  . Chest Pain  . Seizures    HPI Justin Wells is a 65 y.o. male.  HPI  Mr. Justin Wells is a 65 year old man with a history of cerebrovascular disease reports a history of stroke and seizures, history of MI, hypertension who presents today after his seizure activity. His seizures usually Consists of blank staring and confusion. His wife states that he has had one every day for the past 4-5 days. This afternoon he had an episode where he felt that it was coming on and then he became confused. It lasted longer than usual today. She called 911. She states the entire episode lasts about 15 minutes before he is back to baseline. When he became back to his neurological baseline, he began having some substernal chest pain. He states the pain has been worse at 7-8 out of 10. He describes as substernal in nature. He is not clear if it is like his previous cardiac pain. It improved and route when he received a nitroglycerin. It is currently 5 out of 10. He received aspirin and route. Was sweaty earlier but is unclear if this was associated with the chest pain or the seizure. He is not having any dyspnea.  Past Medical History:  Diagnosis Date  . Arthritis    "hands" (01/27/2013)  . Barrett's esophagus   . Cerebrovascular disease   . Coronary artery disease   . Gastroesophageal reflux   . H/O hiatal hernia   . YJEHUDJS(970.2)    "probably monthly" (01/27/2013)  . Hyperlipidemia   . Hypertension   . Myocardial infarction [facility]) 2009   "during cardiac cath" (01/27/2013)  . Peripheral arterial disease (HCC)   . Seizures (HCC)    "when I had my heart attack in 2009" (01/27/2013)  . Stroke [facility]) 2009  2010   "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, weak right leg and hand; I've had 2 or 3 strokes; can't remember the dates" (01/27/2013)     Patient Active Problem List   Diagnosis Date Noted  . Decreased responsiveness 04/12/2015  . Seizure (HCC)   . Acute prostatitis 06/20/2014  . Urgency of micturation 06/20/2014  . Anxiety 04/12/2014  . Chronic cervical pain 04/12/2014  . Panic attack 04/12/2014  . Chest pain 03/08/2014  . PAD (peripheral artery disease) (HCC) 03/08/2014  . Anxiety state 03/08/2014  . Chronic pain syndrome 03/08/2014  . TIA (transient ischemic attack) 03/07/2014  . Burning or prickling sensation 01/06/2014  . Temporary cerebral vascular dysfunction 01/06/2014  . Cerebral infarct (HCC) 01/06/2014  . CVA (cerebral infarction) 12/29/2013  . Hypertension 12/28/2013  . Other and unspecified hyperlipidemia 12/28/2013  . Brainstem infarct not seen on MRI 12/27/2013  . Bad memory 11/18/2013  . Special screening for malignant neoplasm of prostate 11/18/2013  . Ache in joint 07/12/2013  . Clinical depression 03/09/2013  . Generalized OA 03/09/2013  . Numbness and tingling 01/12/2013  . Peripheral vascular disease, unspecified (HCC) 01/12/2013  . Pain in limb 01/12/2013  . Atherosclerosis of native arteries of the extremities with intermittent claudication 01/12/2013  . Hypoglycemia 11/05/2012  . Atherosclerosis of native artery of extremity (HCC) 11/05/2012  . Atherosclerosis of coronary artery 07/04/2011  . Artery disease, cerebral 07/04/2011  . Arteriosclerosis of coronary artery 07/04/2011  . Acid reflux 05/31/2010    Past Surgical History:  Procedure Laterality Date  . ABDOMINAL AORTAGRAM N/A 01/26/2013   Procedure: ABDOMINAL Ronny Flurry;  Surgeon: Nada Libman, MD;  Location: [facility] CATH LAB;  Service: Cardiovascular;  Laterality: N/A;  . CARDIAC CATHETERIZATION    . CORONARY ANGIOPLASTY WITH STENT PLACEMENT  11/02/2007   "2" (01/27/2013)  . DIAGNOSTIC LAPAROSCOPY     "twice after nissen; it kept coming apart " (01/27/2013)  . FEMORAL ARTERY STENT Right 01/26/2013  . LAPAROSCOPIC GASTRIC  BANDING  2010  . LAPAROSCOPIC NISSEN FUNDOPLICATION  ?2001  . LEFT HEART CATHETERIZATION WITH CORONARY ANGIOGRAM N/A 03/09/2014   Procedure: LEFT HEART CATHETERIZATION WITH CORONARY ANGIOGRAM;  Surgeon: Iran Ouch, MD;  Location: [facility] CATH LAB;  Service: Cardiovascular;  Laterality: N/A;  . LOWER EXTREMITY ANGIOGRAM Left 03/02/2013   Procedure: LOWER EXTREMITY ANGIOGRAM;  Surgeon: Nada Libman, MD;  Location: [facility] CATH LAB;  Service: Cardiovascular;  Laterality: Left;       Home Medications    Prior to Admission medications   Medication Sig Start Date End Date Taking? Authorizing Provider  clonazePAM (KLONOPIN) 0.5 MG tablet Take 0.5 tablets (0.25 mg total) by mouth 2 (two) times daily. Patient taking differently: Take 0.25 mg by mouth 2 (two) times daily as needed for anxiety.  03/10/14  Yes Adeline Joselyn Glassman, MD  clopidogrel (PLAVIX) 75 MG tablet TAKE ONE TABLET BY MOUTH ONCE DAILY Patient taking differently: TAKE 75 MG BY MOUTH ONCE DAILY 08/16/15  Yes Nada Libman, MD  esomeprazole (NEXIUM) 40 MG capsule Take 40 mg by mouth at bedtime.  07/01/14 04/18/16 Yes Historical Provider, MD  Homeopathic Products (LEG CRAMP RELIEF PO) Take 2-4 tablets by mouth as needed (for leg cramps).   Yes Historical Provider, MD  HYDROmorphone (DILAUDID) 4 MG tablet Take 4 mg by mouth every 6 (six) hours as needed (for breakthrough pain).  04/03/15  Yes Historical Provider, MD  levETIRAcetam (KEPPRA) 500 MG tablet Take 1 tablet (500 mg total) by mouth 2 (two) times daily. 04/13/15  Yes Zannie Cove, MD  lisinopril (PRINIVIL,ZESTRIL) 5 MG tablet Take 1 tablet (5 mg total) by mouth daily. 12/31/13  Yes Daniel J Angiulli, PA-C  morphine (MS CONTIN) 15 MG 12 hr tablet Take 15 mg by mouth 2 (two) times daily. 03/26/16  Yes Historical Provider, MD  nitroGLYCERIN (NITROSTAT) 0.4 MG SL tablet Place 0.4 mg under the tongue every 5 (five) minutes as needed for chest pain.   Yes Historical Provider, MD  PARoxetine (PAXIL)  20 MG tablet Take 1 tablet (20 mg total) by mouth every morning. 12/31/13  Yes Daniel J Angiulli, PA-C  atorvastatin (LIPITOR) 80 MG tablet Take 1 tablet (80 mg total) by mouth daily at 6 (six) AM. Patient not taking: Reported on 04/18/2016 03/10/14   Kela Millin, MD  cyclobenzaprine (FLEXERIL) 10 MG tablet Take 0.5 tablets (5 mg total) by mouth 2 (two) times daily as needed for muscle spasms. Patient not taking: Reported on 04/18/2016 07/13/14   Ronal Fear, NP    Family History Family History  Problem Relation Age of Onset  . Heart disease Mother     Heart Disease before age 44  . Hypertension Mother   . Heart attack Mother   . Heart attack Father     Social History Social History  Substance Use Topics  . Smoking status: Former Smoker    Packs/day: 2.00    Years: 35.00    Types: Cigarettes    Quit date: 01/28/2001  . Smokeless tobacco: Never  Used  . Alcohol use No     Comment: 01/27/2013 "quit drinking in 2001; I'm a recovering alcoholic"     Allergies   Review of patient's allergies indicates no known allergies.   Review of Systems Review of Systems  All other systems reviewed and are negative.    Physical Exam Updated Vital Signs BP 129/73   Pulse (!) 56   Temp 98.1 F (36.7 C) (Oral)   Resp 12   Ht 5\' 10"  (1.778 m)   Wt 86.2 kg   SpO2 94%   BMI 27.26 kg/m   Physical Exam  Constitutional: He is oriented to person, place, and time. He appears well-developed and well-nourished.  HENT:  Head: Normocephalic and atraumatic.  Right Ear: External ear normal.  Left Ear: External ear normal.  Nose: Nose normal.  Mouth/Throat: Oropharynx is clear and moist.  Eyes: Conjunctivae and EOM are normal. Pupils are equal, round, and reactive to light.  Neck: Normal range of motion. Neck supple.  Cardiovascular: Normal rate, regular rhythm, normal heart sounds and intact distal pulses.   Pulmonary/Chest: Effort normal and breath sounds normal. No respiratory distress. He  has no wheezes. He exhibits no tenderness.  Abdominal: Soft. Bowel sounds are normal. He exhibits no distension and no mass. There is no tenderness. There is no guarding.  Musculoskeletal: Normal range of motion.  Neurological: He is alert and oriented to person, place, and time. He has normal reflexes. He exhibits normal muscle tone. Coordination normal.  Skin: Skin is warm and dry.  Psychiatric: He has a normal mood and affect. His behavior is normal. Judgment and thought content normal.  Nursing note and vitals reviewed.    ED Treatments / Results  Labs (all labs ordered are listed, but only abnormal results are displayed) Labs Reviewed  COMPREHENSIVE METABOLIC PANEL - Abnormal; Notable for the following:       Result Value   Glucose, Bld 102 (*)    Calcium 8.7 (*)    Total Protein 6.0 (*)    All other components within normal limits  CBC  CBG MONITORING, ED  I-STAT TROPOININ, ED  Rosezena SensorI-STAT TROPOININ, ED    EKG  EKG Interpretation  Date/Time:  Thursday April 18 2016 12:33:19 EDT Ventricular Rate:  55 PR Interval:    QRS Duration: 111 QT Interval:  455 QTC Calculation: 436 R Axis:   31 Text Interpretation:  Sinus rhythm RSR' in V1 or V2, right VCD or RVH No significant change since last tracing Confirmed by Arrian Manson MD, Duwayne HeckANIELLE [phone]) on 04/18/2016 4:21:46 PM       Radiology Dg Chest Port 1 View  Result Date: 04/18/2016 CLINICAL DATA:  65 year old male with central chest pain and shortness of breath for 3 days. Initial encounter. Former smoker. EXAM: PORTABLE CHEST 1 VIEW COMPARISON:  Chest radiographs 03/07/2014 and earlier. FINDINGS: Portable AP semi upright view at at 1306 hours. Mediastinal contours remain normal. Visualized tracheal air column is within normal limits. Mild increased interstitial markings have not significantly changed. No pneumothorax, pulmonary edema, pleural effusion or confluent pulmonary opacity. Multiple EKG leads and wires overlie the chest.  Negative visible bowel gas pattern. IMPRESSION: No acute cardiopulmonary abnormality. Electronically Signed   By: Odessa FlemingH  Hall M.D.   On: 04/18/2016 13:20    Procedures Procedures (including critical care time)  Medications Ordered in ED Medications  nitroGLYCERIN (NITROSTAT) SL tablet 0.4 mg (0.4 mg Sublingual Given 04/18/16 1528)  gi cocktail (Maalox,Lidocaine,Donnatal) (30 mLs Oral Given 04/18/16 1528)  Initial Impression / Assessment and Plan / ED Course  I have reviewed the triage vital signs and the nursing notes.  Pertinent labs & imaging results that were available during my care of the patient were reviewed by me and considered in my medical decision making (see chart for details).  Clinical Course    Patient remained stable here in emergency department. I spoke with neurology regarding his seizures. They advised that he liked these are seizures. He has room to go up his Keppra. He became pain-free after my initial evaluation. However, chest pain returned. He is receiving nitroglycerin. Given his history of coronary artery disease, he will be admitted to rule out myocardial ischemia. Neurology will see him during his stay.  Final Clinical Impressions(s) / ED Diagnoses   Final diagnoses:  Chest pain   Seizure  New Prescriptions New Prescriptions   No medications on file     Margarita Grizzleanielle Kendarius Vigen, MD 04/18/16 1657    Margarita Grizzleanielle Ember Henrikson, MD 04/18/16 [phone]

## 2016-04-18 NOTE — ED Triage Notes (Signed)
Pt. Justin Wells a hx of seizures and chest pain.  Pt. 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  Pt. Reports that he can feel it coming on and becomes dizzy and sweating and has to sit down. No incontinence or injury to tongue.  PT. Also having chest pain and pressure. The pain increases with deep breath, movement and palpation.  Pt. Stated, "I have chronic pain and I take Dilaudid 4 mg Q every 4 hrs. and MS contin 15 mg BID. Pt. Denies any n/v.  Pt.s CBG was 62 at the scene and was given 12.5 glucose which increased the CBG 100.  Pt. Also received, 324mg  Aspirin and 1 Nitro SL.//

## 2016-04-18 NOTE — ED Notes (Signed)
Pt and family updated on POC, pt states he is 8/10 chest pain, pt requesting family to bring food from [employer], pt oriented that he has to be NPO until order from MD.

## 2016-04-18 NOTE — H&P (Addendum)
[facility] History and Physical  Justin Wells ZOX:[id number] DOB: Aug 20, 1951 DOA: 04/18/2016   PCP: Carmin Richmond, MD   Chief Complaint: "This is the worst seizure he's had."-Wife  HPI: Justin Wells is a 65 y.o. male with past medical history significant for coronary artery disease, stroke, hypertension, heart attack, coronary artery stents presented to the emergency room with a chief complaint of recurrent seizures. Patient typically does not have daily Seizures but Has Had 1 a Day since Sunday. Patient Is Also Had an Increase in the Severity of His Seizures. Patient Had One Today Lasting over a Minute and Then This Seizure Became Very Diaphoretic. This Is an Atypical for the Way He Presents with Seizure. The Only Thing Is Different Is the Frequency. Patient Follows up with His Neurologist Regularly. Abdomen No Changes in His Medications. Patient Denies Any Head Trauma. Patient Denies Any recent Illnesses. Patient Denies Any Recent Medication changes.  Denies fever, chills, nausea, vomiting, rash and headache.  Review of Systems:  As per HPI otherwise 10 point review of systems negative.   Past Medical History:  Diagnosis Date  . Arthritis    "hands" (01/27/2013)  . Barrett's esophagus   . Cerebrovascular disease   . Coronary artery disease   . Gastroesophageal reflux   . H/O hiatal hernia   . WJXBJYNW(295.6)    "probably monthly" (01/27/2013)  . Hyperlipidemia   . Hypertension   . Myocardial infarction [facility]) 2009   "during cardiac cath" (01/27/2013)  . Peripheral arterial disease (HCC)   . Seizures (HCC)    "when I had my heart attack in 2009" (01/27/2013)  . Stroke [facility]) 2009  2010   "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, weak right leg and hand; I've had 2 or 3 strokes; can't remember the dates" (01/27/2013)   Past Surgical History:  Procedure Laterality Date  . ABDOMINAL AORTAGRAM N/A 01/26/2013   Procedure: ABDOMINAL Ronny Flurry;  Surgeon: Nada Libman, MD;   Location: [facility] CATH LAB;  Service: Cardiovascular;  Laterality: N/A;  . CARDIAC CATHETERIZATION    . CORONARY ANGIOPLASTY WITH STENT PLACEMENT  11/02/2007   "2" (01/27/2013)  . DIAGNOSTIC LAPAROSCOPY     "twice after nissen; it kept coming apart " (01/27/2013)  . FEMORAL ARTERY STENT Bilateral 01/26/2013  . LAPAROSCOPIC GASTRIC BANDING  2010  . LAPAROSCOPIC NISSEN FUNDOPLICATION  ?2001  . LEFT HEART CATHETERIZATION WITH CORONARY ANGIOGRAM N/A 03/09/2014   Procedure: LEFT HEART CATHETERIZATION WITH CORONARY ANGIOGRAM;  Surgeon: Iran Ouch, MD;  Location: [facility] CATH LAB;  Service: Cardiovascular;  Laterality: N/A;  . LOWER EXTREMITY ANGIOGRAM Left 03/02/2013   Procedure: LOWER EXTREMITY ANGIOGRAM;  Surgeon: Nada Libman, MD;  Location: [facility] CATH LAB;  Service: Cardiovascular;  Laterality: Left;   Social History:  reports that he quit smoking about 15 years ago. His smoking use included Cigarettes. He has a 70.00 pack-year smoking history. He has never used smokeless tobacco. He reports that he does not drink alcohol or use drugs.  No Known Allergies  Family History  Problem Relation Age of Onset  . Heart disease Mother     Heart Disease before age 4  . Hypertension Mother   . Heart attack Mother   . Heart attack Father      Prior to Admission medications   Medication Sig Start Date End Date Taking? Authorizing Provider  clonazePAM (KLONOPIN) 0.5 MG tablet Take 0.5 tablets (0.25 mg total) by mouth 2 (two) times daily. Patient taking differently: Take 0.25  mg by mouth 2 (two) times daily as needed for anxiety.  03/10/14  Yes Adeline Joselyn Glassman Viyuoh, MD  clopidogrel (PLAVIX) 75 MG tablet TAKE ONE TABLET BY MOUTH ONCE DAILY Patient taking differently: TAKE 75 MG BY MOUTH ONCE DAILY 08/16/15  Yes Nada LibmanVance W Brabham, MD  esomeprazole (NEXIUM) 40 MG capsule Take 40 mg by mouth at bedtime.  07/01/14 04/18/16 Yes Historical Provider, MD  Homeopathic Products (LEG CRAMP RELIEF PO) Take 2-4 tablets by mouth as  needed (for leg cramps).   Yes Historical Provider, MD  HYDROmorphone (DILAUDID) 4 MG tablet Take 4 mg by mouth every 6 (six) hours as needed (for breakthrough pain).  04/03/15  Yes Historical Provider, MD  levETIRAcetam (KEPPRA) 500 MG tablet Take 1 tablet (500 mg total) by mouth 2 (two) times daily. 04/13/15  Yes Zannie CovePreetha Joseph, MD  lisinopril (PRINIVIL,ZESTRIL) 5 MG tablet Take 1 tablet (5 mg total) by mouth daily. 12/31/13  Yes Daniel J Angiulli, PA-C  morphine (MS CONTIN) 15 MG 12 hr tablet Take 15 mg by mouth 2 (two) times daily. 03/26/16  Yes Historical Provider, MD  nitroGLYCERIN (NITROSTAT) 0.4 MG SL tablet Place 0.4 mg under the tongue every 5 (five) minutes as needed for chest pain.   Yes Historical Provider, MD  PARoxetine (PAXIL) 20 MG tablet Take 1 tablet (20 mg total) by mouth every morning. 12/31/13  Yes Daniel J Angiulli, PA-C  atorvastatin (LIPITOR) 80 MG tablet Take 1 tablet (80 mg total) by mouth daily at 6 (six) AM. Patient not taking: Reported on 04/18/2016 03/10/14   Kela MillinAdeline C Viyuoh, MD  cyclobenzaprine (FLEXERIL) 10 MG tablet Take 0.5 tablets (5 mg total) by mouth 2 (two) times daily as needed for muscle spasms. Patient not taking: Reported on 04/18/2016 07/13/14   Ronal FearLynn E Lam, NP   Physical Exam: Vitals:   04/18/16 1530 04/18/16 1545 04/18/16 1600 04/18/16 1615  BP: 128/83 118/75 125/74 131/79  Pulse: 63 (!) 57 (!) 54 (!) 54  Resp: 16 14 14 11   Temp:      TempSrc:      SpO2: 98% 93% 94% 94%  Weight:      Height:        Wt Readings from Last 3 Encounters:  04/18/16 86.2 kg (190 lb)  04/12/15 90.5 kg (199 lb 8 oz)  07/13/14 94.1 kg (207 lb 8 oz)    General:  Appears calm and comfortable Eyes:  PERRL, EOMI, normal lids, iris ENT:  grossly normal hearing, lips & tongue Neck:  no LAD, masses or thyromegaly Cardiovascular:  RRR, no m/r/g. No LE edema.  Respiratory:  CTA bilaterally, no w/r/r. Normal respiratory effort. Abdomen:  soft, ntnd Skin:  no rash or induration  seen on limited exam Musculoskeletal:  grossly normal tone BUE/BLE Psychiatric:  grossly normal mood and affect, speech fluent and appropriate Neurologic:  CN 2-12 grossly intact, moves all extremities in coordinated fashion. left upper show any weakness in right. Normal finger-nose-finger, slow.          Labs on Admission:  Basic Metabolic Panel:  Recent Labs Lab 04/18/16 1302  NA 137  K 3.5  CL 105  CO2 25  GLUCOSE 102*  BUN 8  CREATININE 0.94  CALCIUM 8.7*   Liver Function Tests:  Recent Labs Lab 04/18/16 1302  AST 21  ALT 20  ALKPHOS 64  BILITOT 0.6  PROT 6.0*  ALBUMIN 3.8   No results for input(s): LIPASE, AMYLASE in the last 168 hours. No results for  input(s): AMMONIA in the last 168 hours. CBC:  Recent Labs Lab 04/18/16 1302  WBC 9.4  HGB 13.5  HCT 40.2  MCV 86.8  PLT 206   Cardiac Enzymes: No results for input(s): CKTOTAL, CKMB, CKMBINDEX, TROPONINI in the last 168 hours.  BNP (last 3 results) No results for input(s): BNP in the last 8760 hours.  ProBNP (last 3 results) No results for input(s): PROBNP in the last 8760 hours.   Serum creatinine: 0.94 mg/dL 82/95/6208/17/17 13081302 Estimated creatinine clearance: 83.1 mL/min  CBG:  Recent Labs Lab 04/18/16 1238  GLUCAP 92    Radiological Exams on Admission: Dg Chest Port 1 View  Result Date: 04/18/2016 CLINICAL DATA:  65 year old male with central chest pain and shortness of breath for 3 days. Initial encounter. Former smoker. EXAM: PORTABLE CHEST 1 VIEW COMPARISON:  Chest radiographs 03/07/2014 and earlier. FINDINGS: Portable AP semi upright view at at 1306 hours. Mediastinal contours remain normal. Visualized tracheal air column is within normal limits. Mild increased interstitial markings have not significantly changed. No pneumothorax, pulmonary edema, pleural effusion or confluent pulmonary opacity. Multiple EKG leads and wires overlie the chest. Negative visible bowel gas pattern. IMPRESSION: No  acute cardiopulmonary abnormality. Electronically Signed   By: Odessa FlemingH  Hall M.D.   On: 04/18/2016 13:20    EKG: Independently reviewed. PR interval 180 ventricular rate 57 curettes duration 114; bradycardia, sinus rhythm, no STEMI, flattening of T waves in V2 and aVL with compared to prior EKG dated 04/13/2015  Assessment/Plan Principal Problem:   Chest pain Active Problems:   Hypertension   Anxiety state   Chronic pain syndrome   Urgency of micturation   Seizure (HCC)  CP Given hear score of 5 in ED. Patient is a known vasculopath. Patient has not had a recent cardiology visit as patient fired his cardiologist. Last heart cath in 2014. Chest pain uncontrolled in the emergency room, patient has received nitroglycerin 3 times. Discussed starting nitro drip with ED provider. Patient does have significant GI history following esophagus but GI cocktail temperature was ineffective. - serial trop ordered, initial neg x2, one more for triple rule out; no patient's symptoms have been going on for a few days - prn EKG CP - prn moprhine CP; patient may require higher doses of when necessary morphine as he is very opioid tolerant due to chronic pain medications - prn ntg cp - asa in EMS and QD - ECHO ordered for AM - cardio consult in AM - tele bed, cardiac monitoring - ambien for sleep prn - zofran prn for nausea  CAD Continue home Plavix  Increased seizure activity Patient has history of absent seizures with increased frequency recently Dr. Petra KubaKilpatrick (neurology) seen patient in the emergency room, will follow along Waiting recommendations Continue Keppra Home regimen 500mg ->750mg  until neurology recommendations available  Hypertension Lisinopril 5 mg daily  Hyperlipidemia Patient off this medication will check lipid panel  Chronic pain Continue home MS Contin & dilaudid Continuous pulse ox  Reflux Nexium->Protonix 40 mg by mouth daily Patient had epigastric tenderness on exam  likely related to this Will check KUB due to likelihood of severe constipation due to medication regimen  Anxiety Klonopin 0.5 mg by mouth twice a day when necessary Paxil 20 mg every morning  Code Status: full DVT Prophylaxis: SCD Family Communication: Wife at bedside Disposition Plan: Home  STEPDOWN unit Obs    Haydee SalterPhillip M Hobbs, MD Family Medicine [facility] [email or web address] Password [email or web address]

## 2016-04-18 NOTE — ED Notes (Signed)
Report attempted, RN unable to get report at this time. 

## 2016-04-18 NOTE — Consult Note (Signed)
Neurology Consultation Reason for Consult: Seizures Referring Physician: Rosalia Hammersay, D  CC: Transient episodes of alteration of consciousness  History is obtained from: Wife, patient  HPI: Justin Wells is a 65 y.o. male who has had recurrent stereotyped spells for the past couple of years. He describes the spells as feeling "dizzy" and confused. He gets very clammy. He becomes disoriented and has to sit down. Following this, he is confused for about 15-20 minutes and then very sleepy. He states he usually goes and sleeps for about 4 hours. He typically has 2-3 of these a month. The etiology of these spells has been a little unclear, with possibilities including posterior circulation hypoperfusion due to his stenosis versus seizures.  His wife checks his blood sugars as well as his blood pressures during these events. She states that most of the time, they are normal but he has had some with blood sugars as low as the 40s. Over the past week, they have been more frequent and he has been having chest pain as well.    ROS: A 14 point ROS was performed and is negative except as noted in the HPI.   Past Medical History:  Diagnosis Date  . Arthritis    "hands" (01/27/2013)  . Barrett's esophagus   . Cerebrovascular disease   . Coronary artery disease   . Gastroesophageal reflux   . H/O hiatal hernia   . ZOXWRUEA(540.9Headache(784.0)    "probably monthly" (01/27/2013)  . Hyperlipidemia   . Hypertension   . Myocardial infarction [facility](HCC) 2009   "during cardiac cath" (01/27/2013)  . Peripheral arterial disease (HCC)   . Seizures (HCC)    "when I had my heart attack in 2009" (01/27/2013)  . 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 Heart disease Mother     Heart Disease before age 63  . Hypertension Mother   . Heart attack Mother   . Heart  attack Father      Social History:  reports that he quit smoking about 15 years ago. His smoking use included Cigarettes. He has a 70.00 pack-year smoking history. He has never used smokeless tobacco. He reports that he does not drink alcohol or use drugs.   Exam: Current vital signs: BP 126/75 (BP Location: Right Arm)   Pulse 71   Temp 97.6 F (36.4 C) (Oral)   Resp 14   Ht 5\' 10"  (1.778 m)   Wt 88.4 kg (194 lb 12.8 oz)   SpO2 98%   BMI 27.95 kg/m  Vital signs in last 24 hours: Temp:  [97.6 F (36.4 C)-98.1 F (36.7 C)] 97.6 F (36.4 C) (08/17 1832) Pulse Rate:  [52-71] 71 (08/17 1832) Resp:  [9-16] 14 (08/17 1832) BP: (112-131)/(70-83) 126/75 (08/17 1832) SpO2:  [93 %-98 %] 98 % (08/17 1832) Weight:  [86.2 kg (190 lb)-88.4 kg (194 lb 12.8 oz)] 88.4 kg (194 lb 12.8 oz) (08/17 1832)   Physical Exam  Constitutional: Appears well-developed and well-nourished.  Psych: Affect appropriate to situation Eyes: No scleral injection HENT: No OP obstrucion Head: Normocephalic.  Cardiovascular: Normal rate and regular rhythm.  Respiratory: Effort normal and breath sounds normal to anterior ascultation GI: Soft.  No distension. There is no tenderness.  Skin: WDI  Neuro: Mental Status: Patient is awake, alert, oriented  to person, place, month, year, and situation. Patient is able to give a clear and coherent history. No signs of aphasia or neglect Cranial Nerves: II: Visual Fields are full. Pupils are equal, round, and reactive to light.   III,IV, VI: Eyes were slightly disconjugate at first, but with fixation he does conjugate his eyes in all directions. V: Facial sensation is symmetric to temperature VII: Facial movement is decreased on the right VIII: hearing is intact to voice X: Uvula elevates symmetrically XI: Shoulder shrug is symmetric. XII: tongue is midline without atrophy or fasciculations.  Motor: Tone is normal. Bulk is normal. He has a mild right  hemiparesis Sensory: Sensation is decreased on the right Cerebellar: No clear ataxia   I have reviewed labs in epic and the results pertinent to this consultation are: CMP-unremarkable  Impression: Increased frequency of spells. Given the recurrent episodic nature and time course of her which they have been occurring without permanent deficit, my suspicion is that this represents focal seizures as opposed to TIA. The description, however, would be actually more consistent with presyncope or hypoglycemia, though the wife reporting that he has normal blood pressure and blood sugar during most of the spells would argue against this.  I do think repeating some workup including imaging and EEG would be prudent at this time.  Recommendations: 1) EEG 2) MRI brain, MRA head 3) increase Keppra to 750 twice a day   Ritta SlotMcNeill Yitzhak Awan, MD [facility] [phone]  If 7pm- 7am, please page neurology on call as listed in [facility].

## 2016-04-18 NOTE — ED Notes (Signed)
Admitting MD at the bedside.  

## 2016-04-19 ENCOUNTER — Observation Stay (HOSPITAL_BASED_OUTPATIENT_CLINIC_OR_DEPARTMENT_OTHER): Payer: Medicare HMO

## 2016-04-19 ENCOUNTER — Observation Stay (HOSPITAL_COMMUNITY): Payer: Medicare HMO

## 2016-04-19 ENCOUNTER — Other Ambulatory Visit (HOSPITAL_COMMUNITY): Payer: Self-pay | Admitting: *Deleted

## 2016-04-19 ENCOUNTER — Other Ambulatory Visit (HOSPITAL_COMMUNITY): Payer: BC Managed Care – PPO

## 2016-04-19 ENCOUNTER — Encounter (HOSPITAL_COMMUNITY): Payer: Self-pay | Admitting: Radiology

## 2016-04-19 DIAGNOSIS — R1013 Epigastric pain: Secondary | ICD-10-CM

## 2016-04-19 DIAGNOSIS — I739 Peripheral vascular disease, unspecified: Secondary | ICD-10-CM | POA: Diagnosis not present

## 2016-04-19 DIAGNOSIS — I252 Old myocardial infarction: Secondary | ICD-10-CM | POA: Diagnosis not present

## 2016-04-19 DIAGNOSIS — I10 Essential (primary) hypertension: Secondary | ICD-10-CM | POA: Diagnosis not present

## 2016-04-19 DIAGNOSIS — M79602 Pain in left arm: Secondary | ICD-10-CM | POA: Diagnosis not present

## 2016-04-19 DIAGNOSIS — R6889 Other general symptoms and signs: Secondary | ICD-10-CM

## 2016-04-19 DIAGNOSIS — F411 Generalized anxiety disorder: Secondary | ICD-10-CM | POA: Diagnosis not present

## 2016-04-19 DIAGNOSIS — E785 Hyperlipidemia, unspecified: Secondary | ICD-10-CM | POA: Diagnosis not present

## 2016-04-19 DIAGNOSIS — R079 Chest pain, unspecified: Secondary | ICD-10-CM | POA: Diagnosis not present

## 2016-04-19 DIAGNOSIS — R072 Precordial pain: Secondary | ICD-10-CM

## 2016-04-19 DIAGNOSIS — G894 Chronic pain syndrome: Secondary | ICD-10-CM | POA: Diagnosis not present

## 2016-04-19 DIAGNOSIS — I2510 Atherosclerotic heart disease of native coronary artery without angina pectoris: Secondary | ICD-10-CM | POA: Diagnosis not present

## 2016-04-19 DIAGNOSIS — R569 Unspecified convulsions: Secondary | ICD-10-CM | POA: Diagnosis not present

## 2016-04-19 DIAGNOSIS — IMO0001 Reserved for inherently not codable concepts without codable children: Secondary | ICD-10-CM

## 2016-04-19 LAB — ECHOCARDIOGRAM COMPLETE
Height: 70 in
WEIGHTICAEL: 3025.6 [oz_av]

## 2016-04-19 LAB — TSH: TSH: 0.51 u[IU]/mL (ref 0.350–4.500)

## 2016-04-19 LAB — TROPONIN I

## 2016-04-19 LAB — T4, FREE: FREE T4: 0.83 ng/dL (ref 0.61–1.12)

## 2016-04-19 MED ORDER — LORAZEPAM 2 MG/ML IJ SOLN
1.0000 mg | Freq: Once | INTRAMUSCULAR | Status: AC
  Administered 2016-04-19: 1 mg via INTRAVENOUS
  Filled 2016-04-19: qty 1

## 2016-04-19 MED ORDER — IOPAMIDOL (ISOVUE-370) INJECTION 76%
INTRAVENOUS | Status: AC
  Administered 2016-04-19: 100 mL
  Filled 2016-04-19: qty 100

## 2016-04-19 MED ORDER — PANTOPRAZOLE SODIUM 40 MG PO TBEC
40.0000 mg | DELAYED_RELEASE_TABLET | Freq: Every day | ORAL | Status: DC
  Administered 2016-04-19 – 2016-04-20 (×2): 40 mg via ORAL
  Filled 2016-04-19 (×2): qty 1

## 2016-04-19 NOTE — Progress Notes (Signed)
[facility] PROGRESS NOTE  Justin Wells:[id number] DOB: [birth year] DOA: 04/18/2016   PCP: Carmin Richmond, MD     Assessment/Plan: Principal Problem:   Chest pain Active Problems:   Hypertension   Anxiety state   Chronic pain syndrome   Urgency of micturation   Seizure (HCC)   Epigastric pain   Justin Wells is a 65 y.o. male with past medical history significant for coronary artery disease, stroke, hypertension, heart attack, coronary artery stents placed in 2009, presented to the emergency room with a chief complaint of recurrent seizures. Patient typically does not have daily Seizures but Has Had 1 a Day since Sunday. Patient Is Also Had an Increase in the Severity of His Seizures. Patient Had One Today Lasting over a Minute and Then This Seizure Became Very Diaphoretic. This Is an Atypical for the Way He Presents with Seizure. The Only Thing Is Different Is the Frequency. Patient Follows up with His Neurologist Regularly. Abdomen No Changes in His Medications. Patient Denies Any Head Trauma. Patient Denies Any recent Illnesses. Patient Denies Any Recent Medication changes.  Assessment and plan CP Given hear score of 5 in ED. Patient is a known vasculopath. Patient has not had a recent cardiology visit as patient fired his cardiologist. Last heart cath in 2014. Chest pain uncontrolled in the emergency room, patient has received nitroglycerin 3 times and has been on nitroglycerin drip titrated by RN through the night. According to the wife the patient has had chest pain for 4 weeks. Not particularly related to exertion  Patient does have significant GI history following esophagus but GI cocktail temperature was ineffective. - serial trop ordered, initial neg x2, no patient's symptoms have been going on for a few days Cardiac enzymes negative 3 2-D echo ordered and pending Currently on aspirin and Plavix, only takes Plavix at home Continue  telemetry  CAD Continue home Plavix/aspirin  focal seizures as opposed VS  TIA Patient has history of absent seizures with increased frequency recently Dr. Petra Kuba (neurology) evaluated the patient, ordered EEG, MRI of the brain, MRA head, Increase Keppra to 750 mg twice a day MRI/MRA of the brain negative Check UDS Chronically on MS Contin  Hypertension Lisinopril 5 mg daily  Hyperlipidemia Patient off this medication will check lipid panel  Chronic pain Continue home MS Contin & dilaudid Continuous pulse ox  Reflux Nexium->Protonix 40 mg by mouth daily Patient had epigastric tenderness on exam likely related to this Will check KUB due to likelihood of severe constipation due to medication regimen  Anxiety Klonopin 0.5 mg by mouth twice a day when necessary Paxil 20 mg every morning   Hypoglycemia-check hemoglobin A1c   DVT prophylaxsis Lovenox  Code Status:  Full code      Family Communication: Discussed in detail with the patient, all imaging results, lab results explained to the patient   Disposition Plan:  Cardiology consult     Consultants:  Neurology  Cardiology  Procedures: 2-D echo  Antibiotics: Anti-infectives    None         HPI/Subjective: Ongoing chest pain despite being on nitro drip  Objective: Vitals:   04/19/16 0205 04/19/16 0231 04/19/16 0417 04/19/16 0753  BP:  109/66 110/73   Pulse:  60 (!) 58 66  Resp:  13 14 19   Temp: 98.1 F (36.7 C)  97.8 F (36.6 C) 97.9 F (36.6 C)  TempSrc: Oral  Oral Oral  SpO2:  98% 96% 99%  Weight:   85.8 kg (189 lb 1.6 oz)   Height:        Intake/Output Summary (Last 24 hours) at 04/19/16 0820 Last data filed at 04/19/16 0300  Gross per 24 hour  Intake            58.65 ml  Output              600 ml  Net          -541.35 ml    Exam:  Examination:  General exam: Appears calm and comfortable  Respiratory system: Clear to auscultation. Respiratory effort  normal. Cardiovascular system: S1 & S2 heard, RRR. No JVD, murmurs, rubs, gallops or clicks. No pedal edema. Gastrointestinal system: Abdomen is nondistended, soft and nontender. No organomegaly or masses felt. Normal bowel sounds heard. Central nervous system: Alert and oriented. No focal neurological deficits. Extremities: Symmetric 5 x 5 power. Skin: No rashes, lesions or ulcers Psychiatry: Judgement and insight appear normal. Mood & affect appropriate.     Data Reviewed: I have personally reviewed following labs and imaging studies  Micro Results No results found for this or any previous visit (from the past 240 hour(s)).  Radiology Reports Dg Abd 1 View  Result Date: 04/19/2016 CLINICAL DATA:  Acute onset of epigastric abdominal pain. Initial encounter. EXAM: ABDOMEN - 1 VIEW COMPARISON:  Chest and abdominal radiographs performed 09/02/2008 FINDINGS: The visualized bowel gas pattern is unremarkable. Scattered air and stool filled loops of colon are seen; no abnormal dilatation of small bowel loops is seen to suggest small bowel obstruction. No free intra-abdominal air is identified, though evaluation for free air is limited on a single supine view. The visualized osseous structures are within normal limits; the sacroiliac joints are unremarkable in appearance. IMPRESSION: Unremarkable bowel gas pattern; no free intra-abdominal air seen. Moderate amount of stool noted in the colon. Electronically Signed   By: Roanna Raider M.D.   On: 04/19/2016 02:06   Mr Maxine Glenn Head Wo Contrast  Result Date: 04/19/2016 CLINICAL DATA:  66 y/o M; PE recurrence. Type spells for several years with feeling of dizziness and confusion. Coal the concern for posterior circulation hypoperfusion versus seizures. EXAM: MRI HEAD WITHOUT CONTRAST MRA HEAD WITHOUT CONTRAST TECHNIQUE: Multiplanar, multiecho pulse sequences of the brain and surrounding structures were obtained without intravenous contrast. Angiographic  images of the head were obtained using MRA technique without contrast. COMPARISON:  MRI of the brain dated 04/13/2015. MRA of the head dated 12/27/2013. FINDINGS: MRI HEAD FINDINGS Brain: No diffusion restriction to suggest acute or early subacute infarct. No abnormal susceptibility hypointensity to indicate intracranial hemorrhage. Mild chronic microvascular ischemic changes are stable. No focal mass effect. No gross structural abnormality, disorder cortical formation, or gray matter heterotopia is identified. Hippocampi by are symmetric in size bilaterally. Extra-axial space: Normal ventricular size. No midline shift. No effacement of basilar cisterns. No extra-axial collection is identified. Proximal intracranial flow voids are maintained. No abnormality of the cervical medullary junction. Other: Mild diffuse paranasal sinus mucosal thickening. Pain partial opacification of posterior ethmoid air cells. No abnormal signal of the mastoid air cells. Orbits are unremarkable. Calvarium is unremarkable. MRA HEAD FINDINGS Internal carotid arteries:  Patent. Anterior cerebral arteries:  Patent. Middle cerebral arteries: Patent. Anterior communicating artery: Patent. Posterior communicating arteries: Patent left posterior communicating artery. No right kidney Posterior cerebral arteries: Patent. Apparent decreased flow related signal within the left posterior cerebral circulation is probably due to fetal PCA origin and signal saturation. No proximal  stenosis is identified. Basilar artery:  Patent.  Proximal fenestration. Vertebral arteries:  Patent. No evidence of high-grade stenosis, large vessel occlusion, or aneurysm unless noted above. IMPRESSION: 1. No occlusion, aneurysm, dissection, or significant stenosis is identified. Mild motion artifact of the MRA sequence. No gross interval change. 2. No evidence of acute infarct, intracranial hemorrhage, focal mass effect, or hydrocephalus. Stable mild chronic microvascular  ischemic changes and parenchymal volume loss. 3. No structural abnormality identified. Electronically Signed   By: Mitzi HansenLance  Furusawa-Stratton M.D.   On: 04/19/2016 02:16   Mr Brain Wo Contrast  Result Date: 04/19/2016 CLINICAL DATA:  65 y/o M; PE recurrence. Type spells for several years with feeling of dizziness and confusion. Coal the concern for posterior circulation hypoperfusion versus seizures. EXAM: MRI HEAD WITHOUT CONTRAST MRA HEAD WITHOUT CONTRAST TECHNIQUE: Multiplanar, multiecho pulse sequences of the brain and surrounding structures were obtained without intravenous contrast. Angiographic images of the head were obtained using MRA technique without contrast. COMPARISON:  MRI of the brain dated 04/13/2015. MRA of the head dated 12/27/2013. FINDINGS: MRI HEAD FINDINGS Brain: No diffusion restriction to suggest acute or early subacute infarct. No abnormal susceptibility hypointensity to indicate intracranial hemorrhage. Mild chronic microvascular ischemic changes are stable. No focal mass effect. No gross structural abnormality, disorder cortical formation, or gray matter heterotopia is identified. Hippocampi by are symmetric in size bilaterally. Extra-axial space: Normal ventricular size. No midline shift. No effacement of basilar cisterns. No extra-axial collection is identified. Proximal intracranial flow voids are maintained. No abnormality of the cervical medullary junction. Other: Mild diffuse paranasal sinus mucosal thickening. Pain partial opacification of posterior ethmoid air cells. No abnormal signal of the mastoid air cells. Orbits are unremarkable. Calvarium is unremarkable. MRA HEAD FINDINGS Internal carotid arteries:  Patent. Anterior cerebral arteries:  Patent. Middle cerebral arteries: Patent. Anterior communicating artery: Patent. Posterior communicating arteries: Patent left posterior communicating artery. No right kidney Posterior cerebral arteries: Patent. Apparent decreased flow  related signal within the left posterior cerebral circulation is probably due to fetal PCA origin and signal saturation. No proximal stenosis is identified. Basilar artery:  Patent.  Proximal fenestration. Vertebral arteries:  Patent. No evidence of high-grade stenosis, large vessel occlusion, or aneurysm unless noted above. IMPRESSION: 1. No occlusion, aneurysm, dissection, or significant stenosis is identified. Mild motion artifact of the MRA sequence. No gross interval change. 2. No evidence of acute infarct, intracranial hemorrhage, focal mass effect, or hydrocephalus. Stable mild chronic microvascular ischemic changes and parenchymal volume loss. 3. No structural abnormality identified. Electronically Signed   By: Mitzi HansenLance  Furusawa-Stratton M.D.   On: 04/19/2016 02:16   Dg Chest Port 1 View  Result Date: 04/18/2016 CLINICAL DATA:  65 year old male with central chest pain and shortness of breath for 3 days. Initial encounter. Former smoker. EXAM: PORTABLE CHEST 1 VIEW COMPARISON:  Chest radiographs 03/07/2014 and earlier. FINDINGS: Portable AP semi upright view at at 1306 hours. Mediastinal contours remain normal. Visualized tracheal air column is within normal limits. Mild increased interstitial markings have not significantly changed. No pneumothorax, pulmonary edema, pleural effusion or confluent pulmonary opacity. Multiple EKG leads and wires overlie the chest. Negative visible bowel gas pattern. IMPRESSION: No acute cardiopulmonary abnormality. Electronically Signed   By: Odessa FlemingH  Hall M.D.   On: 04/18/2016 13:20     CBC  Recent Labs Lab 04/18/16 1302 04/18/16 2206  WBC 9.4 8.1  HGB 13.5 13.8  HCT 40.2 41.2  PLT 206 208  MCV 86.8 88.6  MCH 29.2 29.7  MCHC  33.6 33.5  RDW 13.2 13.4    Chemistries   Recent Labs Lab 04/18/16 1302 04/18/16 2206  NA 137  --   K 3.5  --   CL 105  --   CO2 25  --   GLUCOSE 102*  --   BUN 8  --   CREATININE 0.94 1.08  CALCIUM 8.7*  --   AST 21  --   ALT  20  --   ALKPHOS 64  --   BILITOT 0.6  --    ------------------------------------------------------------------------------------------------------------------ estimated creatinine clearance is 72.3 mL/min (by C-G formula based on SCr of 1.08 mg/dL). ------------------------------------------------------------------------------------------------------------------ No results for input(s): HGBA1C in the last 72 hours. ------------------------------------------------------------------------------------------------------------------  Recent Labs  04/18/16 1919  CHOL 157  HDL 39*  LDLCALC 97  TRIG 161  CHOLHDL 4.0   ------------------------------------------------------------------------------------------------------------------ No results for input(s): TSH, T4TOTAL, T3FREE, THYROIDAB in the last 72 hours.  Invalid input(s): FREET3 ------------------------------------------------------------------------------------------------------------------ No results for input(s): VITAMINB12, FOLATE, FERRITIN, TIBC, IRON, RETICCTPCT in the last 72 hours.  Coagulation profile No results for input(s): INR, PROTIME in the last 168 hours.  No results for input(s): DDIMER in the last 72 hours.  Cardiac Enzymes  Recent Labs Lab 04/18/16 2206 04/19/16 0653  TROPONINI <0.03 <0.03   ------------------------------------------------------------------------------------------------------------------ Invalid input(s): POCBNP   CBG:  Recent Labs Lab 04/18/16 1238  GLUCAP 92       Studies: Dg Abd 1 View  Result Date: 04/19/2016 CLINICAL DATA:  Acute onset of epigastric abdominal pain. Initial encounter. EXAM: ABDOMEN - 1 VIEW COMPARISON:  Chest and abdominal radiographs performed 09/02/2008 FINDINGS: The visualized bowel gas pattern is unremarkable. Scattered air and stool filled loops of colon are seen; no abnormal dilatation of small bowel loops is seen to suggest small bowel obstruction.  No free intra-abdominal air is identified, though evaluation for free air is limited on a single supine view. The visualized osseous structures are within normal limits; the sacroiliac joints are unremarkable in appearance. IMPRESSION: Unremarkable bowel gas pattern; no free intra-abdominal air seen. Moderate amount of stool noted in the colon. Electronically Signed   By: Roanna Raider M.D.   On: 04/19/2016 02:06   Mr Maxine Glenn Head Wo Contrast  Result Date: 04/19/2016 CLINICAL DATA:  65 y/o M; PE recurrence. Type spells for several years with feeling of dizziness and confusion. Coal the concern for posterior circulation hypoperfusion versus seizures. EXAM: MRI HEAD WITHOUT CONTRAST MRA HEAD WITHOUT CONTRAST TECHNIQUE: Multiplanar, multiecho pulse sequences of the brain and surrounding structures were obtained without intravenous contrast. Angiographic images of the head were obtained using MRA technique without contrast. COMPARISON:  MRI of the brain dated 04/13/2015. MRA of the head dated 12/27/2013. FINDINGS: MRI HEAD FINDINGS Brain: No diffusion restriction to suggest acute or early subacute infarct. No abnormal susceptibility hypointensity to indicate intracranial hemorrhage. Mild chronic microvascular ischemic changes are stable. No focal mass effect. No gross structural abnormality, disorder cortical formation, or gray matter heterotopia is identified. Hippocampi by are symmetric in size bilaterally. Extra-axial space: Normal ventricular size. No midline shift. No effacement of basilar cisterns. No extra-axial collection is identified. Proximal intracranial flow voids are maintained. No abnormality of the cervical medullary junction. Other: Mild diffuse paranasal sinus mucosal thickening. Pain partial opacification of posterior ethmoid air cells. No abnormal signal of the mastoid air cells. Orbits are unremarkable. Calvarium is unremarkable. MRA HEAD FINDINGS Internal carotid arteries:  Patent. Anterior  cerebral arteries:  Patent. Middle cerebral arteries: Patent. Anterior communicating artery: Patent. Posterior communicating  arteries: Patent left posterior communicating artery. No right kidney Posterior cerebral arteries: Patent. Apparent decreased flow related signal within the left posterior cerebral circulation is probably due to fetal PCA origin and signal saturation. No proximal stenosis is identified. Basilar artery:  Patent.  Proximal fenestration. Vertebral arteries:  Patent. No evidence of high-grade stenosis, large vessel occlusion, or aneurysm unless noted above. IMPRESSION: 1. No occlusion, aneurysm, dissection, or significant stenosis is identified. Mild motion artifact of the MRA sequence. No gross interval change. 2. No evidence of acute infarct, intracranial hemorrhage, focal mass effect, or hydrocephalus. Stable mild chronic microvascular ischemic changes and parenchymal volume loss. 3. No structural abnormality identified. Electronically Signed   By: Mitzi Hansen M.D.   On: 04/19/2016 02:16   Mr Brain Wo Contrast  Result Date: 04/19/2016 CLINICAL DATA:  65 y/o M; PE recurrence. Type spells for several years with feeling of dizziness and confusion. Coal the concern for posterior circulation hypoperfusion versus seizures. EXAM: MRI HEAD WITHOUT CONTRAST MRA HEAD WITHOUT CONTRAST TECHNIQUE: Multiplanar, multiecho pulse sequences of the brain and surrounding structures were obtained without intravenous contrast. Angiographic images of the head were obtained using MRA technique without contrast. COMPARISON:  MRI of the brain dated 04/13/2015. MRA of the head dated 12/27/2013. FINDINGS: MRI HEAD FINDINGS Brain: No diffusion restriction to suggest acute or early subacute infarct. No abnormal susceptibility hypointensity to indicate intracranial hemorrhage. Mild chronic microvascular ischemic changes are stable. No focal mass effect. No gross structural abnormality, disorder cortical  formation, or gray matter heterotopia is identified. Hippocampi by are symmetric in size bilaterally. Extra-axial space: Normal ventricular size. No midline shift. No effacement of basilar cisterns. No extra-axial collection is identified. Proximal intracranial flow voids are maintained. No abnormality of the cervical medullary junction. Other: Mild diffuse paranasal sinus mucosal thickening. Pain partial opacification of posterior ethmoid air cells. No abnormal signal of the mastoid air cells. Orbits are unremarkable. Calvarium is unremarkable. MRA HEAD FINDINGS Internal carotid arteries:  Patent. Anterior cerebral arteries:  Patent. Middle cerebral arteries: Patent. Anterior communicating artery: Patent. Posterior communicating arteries: Patent left posterior communicating artery. No right kidney Posterior cerebral arteries: Patent. Apparent decreased flow related signal within the left posterior cerebral circulation is probably due to fetal PCA origin and signal saturation. No proximal stenosis is identified. Basilar artery:  Patent.  Proximal fenestration. Vertebral arteries:  Patent. No evidence of high-grade stenosis, large vessel occlusion, or aneurysm unless noted above. IMPRESSION: 1. No occlusion, aneurysm, dissection, or significant stenosis is identified. Mild motion artifact of the MRA sequence. No gross interval change. 2. No evidence of acute infarct, intracranial hemorrhage, focal mass effect, or hydrocephalus. Stable mild chronic microvascular ischemic changes and parenchymal volume loss. 3. No structural abnormality identified. Electronically Signed   By: Mitzi Hansen M.D.   On: 04/19/2016 02:16   Dg Chest Port 1 View  Result Date: 04/18/2016 CLINICAL DATA:  65 year old male with central chest pain and shortness of breath for 3 days. Initial encounter. Former smoker. EXAM: PORTABLE CHEST 1 VIEW COMPARISON:  Chest radiographs 03/07/2014 and earlier. FINDINGS: Portable AP semi upright  view at at 1306 hours. Mediastinal contours remain normal. Visualized tracheal air column is within normal limits. Mild increased interstitial markings have not significantly changed. No pneumothorax, pulmonary edema, pleural effusion or confluent pulmonary opacity. Multiple EKG leads and wires overlie the chest. Negative visible bowel gas pattern. IMPRESSION: No acute cardiopulmonary abnormality. Electronically Signed   By: Odessa Fleming M.D.   On: 04/18/2016 13:20  Lab Results  Component Value Date   HGBA1C 5.6 03/08/2014   HGBA1C 5.4 12/28/2013   HGBA1C  11/03/2007    5.1 (NOTE)   The ADA recommends the following therapeutic goals for glycemic   control related to Hgb A1C measurement:   Goal of Therapy:   < 7.0% Hgb A1C   Action Suggested:  > 8.0% Hgb A1C   Ref:  Diabetes Care, 22, Suppl. 1, 1999   Lab Results  Component Value Date   LDLCALC 97 04/18/2016   CREATININE 1.08 04/18/2016       Scheduled Meds: . aspirin  324 mg Oral Daily  . clopidogrel  75 mg Oral Daily  . enoxaparin (LOVENOX) injection  40 mg Subcutaneous Q24H  . feeding supplement (ENSURE ENLIVE)  237 mL Oral BID BM  . levETIRAcetam  750 mg Oral BID  . lisinopril  5 mg Oral Daily  . morphine  15 mg Oral BID  . pantoprazole  40 mg Oral Daily  . PARoxetine  20 mg Oral [id number]   Continuous Infusions: . nitroGLYCERIN 20 mcg/min (04/19/16 0204)     LOS: 0 days    Time spent: >30 MINS    Emma Pendleton Bradley HospitalBROL,Nathanel Tallman  [facility] Pager [phone]. If 7PM-7AM, please contact night-coverage at [email or web address], password [email or web address] 04/19/2016, 8:20 AM  LOS: 0 days

## 2016-04-19 NOTE — Progress Notes (Signed)
The client chest pain went from a 7 to a 1 after starting the nitro gtt. The pain fluctuated through out the night so far. It started increasing when we went down for MRI. The client is claustrophobic and became very nervous while getting setup in holding. Chest pain went back up to a 7 nitro gtt was titrated multiple times (please see MAR for titrates and flow sheet for pain assessments) . MD Keenan BachelorSofia was notified and a one time dose of Lorazepam was ordered. The client is now resting in his room and fell asleep within minutes of returning to the unit. I will continue to monitor closely.   Sheppard Evensina Tacie Mccuistion RN

## 2016-04-19 NOTE — Consult Note (Signed)
Cardiology Consult    Patient ID: Justin Wells MRN: [id number], DOB/AGE: Mar 29, 1951   Admit date: 04/18/2016 Date of Consult: 04/19/2016  Primary Physician: [name],[name], MD Reason for Consult: Chest Pain Primary Cardiologist: Dr. Allyson SabalBerry  Requesting Provider: Dr. Susie CassetteAbrol   History of Present Illness   Justin Wells is a 65 y.o. male with past medical history of CAD (s/p DES to 2nd Mrg and LCx in 2009, cath in 03/2014 showing patent stent in LCx), CVA in 2009 in setting of cardiac cath, PVD (s/p L SFA stenting in 2014), HTN, HLD, and seizure disorder who [facility] with seizure activity.   History provided by the patient and his wife. Reports having a known seizure disorder for 3+ years. On Keppra and seizures have been well-controlled until recently. For the past 5 days, he has been having a seizure daily. Wife reports it looks like he passes out, usually lasts 1-2 minutes, then he awakens confused and disoriented. Yesterday, the seizure activity lasted approximately 15 minutes and his wife brought him to the ED for further evaluation.  He has also reported having chest discomfort. Describes it as something sitting on his chest. Has been worse with activity over the past 3 weeks and associated with dyspnea. Pain can also occur at rest. Today, he reports having 4-5/10 chest pain on exam while on IV NTG at 9820mcg/min. Says his IV morphine helps with the pain. His chest is diffusely tender to palpation and he says it hurts for him to touch it. Unsure if it has been reproducible this entire time. Unsure if this resembles his symptoms in 2009.  He does have an EEG is place. Neurology is following and adjusting medications as indicated.   This admission, cyclic troponin values have been negative. EKG shows sinus bradycardia, HR 56, with no acute ST or T-wave changes. Echocardiogram obtained today shows an EF of 55-60% with Grade 1 DD and no wall motion  abnormalities.  Last cardiac catheterization was in 2015 and showed a widely patent stent in the mid left circumflex with no evidence of obstructive disease. Normal LV systolic function with mildly elevated left ventricular end-diastolic pressure noted at that time. H&P reported patient had "fired his Cardiologist". Him and his wife report he has just not been seen recently due to "so many things going on".   Past Medical History   Past Medical History:  Diagnosis Date  . Anxiety   . Arthritis    "hands; back; legs" (04/18/2016)  . Barrett's esophagus   . Cerebrovascular disease   . CHF (congestive heart failure) (HCC)    "said I had this a few years ago; it went away" (04/18/2016)  . Chronic back pain   . Coronary artery disease   . Depression   . Gastroesophageal reflux   . H/O hiatal hernia   . WUJWJXBJ(478.2Headache(784.0)    "probably monthly" (01/27/2013)  . Hyperlipidemia   . Hypertension   . Myocardial infarction [facility](HCC) 2009   "during cardiac cath" (01/27/2013)  . Peripheral arterial disease (HCC)   . Seizures (HCC)    "daily the last 5 days" (04/18/2016)  . Stroke [facility](HCC) 2009  2010   "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, weak right leg and hand; I've had 2 or 3 strokes; can't remember the dates" (01/27/2013)    Past Surgical History:  Procedure Laterality Date  . ABDOMINAL AORTAGRAM N/A 01/26/2013   Procedure: ABDOMINAL Ronny FlurryAORTAGRAM;  Surgeon: Nada LibmanVance W Brabham,  MD;  Location: [facility] CATH LAB;  Service: Cardiovascular;  Laterality: N/A;  . CARDIAC CATHETERIZATION    . COLONOSCOPY    . CORONARY ANGIOPLASTY WITH STENT PLACEMENT  11/02/2007   "2" (01/27/2013)  . DIAGNOSTIC LAPAROSCOPY     "twice after nissen; it kept coming apart " (01/27/2013)  . ESOPHAGOGASTRODUODENOSCOPY    . FEMORAL ARTERY STENT Bilateral 01/26/2013  . HERNIA REPAIR    . LAPAROSCOPIC GASTRIC BANDING  2010  . LAPAROSCOPIC NISSEN FUNDOPLICATION  ?2001  . LEFT HEART CATHETERIZATION WITH CORONARY ANGIOGRAM N/A 03/09/2014    Procedure: LEFT HEART CATHETERIZATION WITH CORONARY ANGIOGRAM;  Surgeon: Iran Ouch, MD;  Location: [facility] CATH LAB;  Service: Cardiovascular;  Laterality: N/A;  . LOWER EXTREMITY ANGIOGRAM Left 03/02/2013   Procedure: LOWER EXTREMITY ANGIOGRAM;  Surgeon: Nada Libman, MD;  Location: [facility] CATH LAB;  Service: Cardiovascular;  Laterality: Left;     Allergies  No Known Allergies  Inpatient Medications    . aspirin  324 mg Oral Daily  . clopidogrel  75 mg Oral Daily  . enoxaparin (LOVENOX) injection  40 mg Subcutaneous Q24H  . feeding supplement (ENSURE ENLIVE)  237 mL Oral BID BM  . levETIRAcetam  750 mg Oral BID  . lisinopril  5 mg Oral Daily  . morphine  15 mg Oral BID  . pantoprazole  40 mg Oral Daily  . PARoxetine  20 mg Oral BH-[id number]    Family History    Family History  Problem Relation Age of Onset  . Heart disease Mother     Heart Disease before age 38  . Hypertension Mother   . Heart attack Mother   . Heart attack Father     Social History    Social History   Social History  . Marital status: Married    Spouse name: sheryl  . Number of children: 3  . Years of education: 11th   Occupational History  . [employer]   Social History Main Topics  . Smoking status: Former Smoker    Packs/day: 2.00    Years: 35.00    Types: Cigarettes    Quit date: 01/29/2000  . Smokeless tobacco: Never Used  . 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 Drug use: No  . Sexual activity: Not Currently   Other Topics Concern  . Not on file   Social History Narrative   Patient lives with wife.   Caffeine Use: greater than 5 servings daily   Patient is right handed.   Patient has 11th grade education.     Review of Systems    General:  No chills, fever, night sweats or weight changes.  Cardiovascular:  No edema, orthopnea, palpitations, paroxysmal nocturnal dyspnea. Positive for chest pain and  dyspnea with exertion.  Dermatological: No rash, lesions/masses Respiratory: No cough, Positive for dyspnea. Urologic: No hematuria, dysuria Abdominal:   No nausea, vomiting, diarrhea, bright red blood per rectum, melena, or hematemesis Neurologic:  No visual changes. Positive for seizure like-activity.  All other systems reviewed and are otherwise negative except as noted above.  Physical Exam    Blood pressure 115/74, pulse 71, temperature 98 F (36.7 C), temperature source Oral, resp. rate 17, height 5\' 10"  (1.778 m), weight 189 lb 1.6 oz (85.8 kg), SpO2 100 %.  General: Pleasant, Caucasian male appearing in NAD Psych: Normal affect. Neuro: Alert and oriented X 3. Moves all  extremities spontaneously. HEENT: Normal; EEG in place.  Neck: Supple without bruits or JVD. Lungs:  Resp regular and unlabored, CTA without wheezing or rales. Heart: RRR no s3, s4, or murmurs. Tender along sternum and left pectoral region.  Abdomen: Soft, non-tender, non-distended, BS + x 4.  Extremities: No clubbing, cyanosis or edema. DP/PT/Radials 2+ and equal bilaterally.  Labs    Troponin [facility] Test)  Recent Labs  04/18/16 1545  TROPIPOC 0.00    Recent Labs  04/18/16 2206 04/19/16 0653  TROPONINI <0.03 <0.03   Lab Results  Component Value Date   WBC 8.1 04/18/2016   HGB 13.8 04/18/2016   HCT 41.2 04/18/2016   MCV 88.6 04/18/2016   PLT 208 04/18/2016    Recent Labs Lab 04/18/16 1302 04/18/16 2206  NA 137  --   K 3.5  --   CL 105  --   CO2 25  --   BUN 8  --   CREATININE 0.94 1.08  CALCIUM 8.7*  --   PROT 6.0*  --   BILITOT 0.6  --   ALKPHOS 64  --   ALT 20  --   AST 21  --   GLUCOSE 102*  --    Lab Results  Component Value Date   CHOL 157 04/18/2016   HDL 39 (L) 04/18/2016   LDLCALC 97 04/18/2016   TRIG 106 04/18/2016   No results found for: [facility]   Radiology Studies    Dg Abd 1 View  Result Date: 04/19/2016 CLINICAL DATA:  Acute onset of epigastric  abdominal pain. Initial encounter. EXAM: ABDOMEN - 1 VIEW COMPARISON:  Chest and abdominal radiographs performed 09/02/2008 FINDINGS: The visualized bowel gas pattern is unremarkable. Scattered air and stool filled loops of colon are seen; no abnormal dilatation of small bowel loops is seen to suggest small bowel obstruction. No free intra-abdominal air is identified, though evaluation for free air is limited on a single supine view. The visualized osseous structures are within normal limits; the sacroiliac joints are unremarkable in appearance. IMPRESSION: Unremarkable bowel gas pattern; no free intra-abdominal air seen. Moderate amount of stool noted in the colon. Electronically Signed   By: Roanna Raider M.D.   On: 04/19/2016 02:06   Mr Maxine Glenn Head Wo Contrast  Result Date: 04/19/2016 CLINICAL DATA:  65 y/o M; PE recurrence. Type spells for several years with feeling of dizziness and confusion. Coal the concern for posterior circulation hypoperfusion versus seizures. EXAM: MRI HEAD WITHOUT CONTRAST MRA HEAD WITHOUT CONTRAST TECHNIQUE: Multiplanar, multiecho pulse sequences of the brain and surrounding structures were obtained without intravenous contrast. Angiographic images of the head were obtained using MRA technique without contrast. COMPARISON:  MRI of the brain dated 04/13/2015. MRA of the head dated 12/27/2013. FINDINGS: MRI HEAD FINDINGS Brain: No diffusion restriction to suggest acute or early subacute infarct. No abnormal susceptibility hypointensity to indicate intracranial hemorrhage. Mild chronic microvascular ischemic changes are stable. No focal mass effect. No gross structural abnormality, disorder cortical formation, or gray matter heterotopia is identified. Hippocampi by are symmetric in size bilaterally. Extra-axial space: Normal ventricular size. No midline shift. No effacement of basilar cisterns. No extra-axial collection is identified. Proximal intracranial flow voids are maintained. No  abnormality of the cervical medullary junction. Other: Mild diffuse paranasal sinus mucosal thickening. Pain partial opacification of posterior ethmoid air cells. No abnormal signal of the mastoid air cells. Orbits are unremarkable. Calvarium is unremarkable. MRA HEAD FINDINGS Internal carotid arteries:  Patent. Anterior cerebral  arteries:  Patent. Middle cerebral arteries: Patent. Anterior communicating artery: Patent. Posterior communicating arteries: Patent left posterior communicating artery. No right kidney Posterior cerebral arteries: Patent. Apparent decreased flow related signal within the left posterior cerebral circulation is probably due to fetal PCA origin and signal saturation. No proximal stenosis is identified. Basilar artery:  Patent.  Proximal fenestration. Vertebral arteries:  Patent. No evidence of high-grade stenosis, large vessel occlusion, or aneurysm unless noted above. IMPRESSION: 1. No occlusion, aneurysm, dissection, or significant stenosis is identified. Mild motion artifact of the MRA sequence. No gross interval change. 2. No evidence of acute infarct, intracranial hemorrhage, focal mass effect, or hydrocephalus. Stable mild chronic microvascular ischemic changes and parenchymal volume loss. 3. No structural abnormality identified. Electronically Signed   By: Mitzi Hansen M.D.   On: 04/19/2016 02:16   Mr Brain Wo Contrast  Result Date: 04/19/2016 CLINICAL DATA:  65 y/o M; PE recurrence. Type spells for several years with feeling of dizziness and confusion. Coal the concern for posterior circulation hypoperfusion versus seizures. EXAM: MRI HEAD WITHOUT CONTRAST MRA HEAD WITHOUT CONTRAST TECHNIQUE: Multiplanar, multiecho pulse sequences of the brain and surrounding structures were obtained without intravenous contrast. Angiographic images of the head were obtained using MRA technique without contrast. COMPARISON:  MRI of the brain dated 04/13/2015. MRA of the head dated  12/27/2013. FINDINGS: MRI HEAD FINDINGS Brain: No diffusion restriction to suggest acute or early subacute infarct. No abnormal susceptibility hypointensity to indicate intracranial hemorrhage. Mild chronic microvascular ischemic changes are stable. No focal mass effect. No gross structural abnormality, disorder cortical formation, or gray matter heterotopia is identified. Hippocampi by are symmetric in size bilaterally. Extra-axial space: Normal ventricular size. No midline shift. No effacement of basilar cisterns. No extra-axial collection is identified. Proximal intracranial flow voids are maintained. No abnormality of the cervical medullary junction. Other: Mild diffuse paranasal sinus mucosal thickening. Pain partial opacification of posterior ethmoid air cells. No abnormal signal of the mastoid air cells. Orbits are unremarkable. Calvarium is unremarkable. MRA HEAD FINDINGS Internal carotid arteries:  Patent. Anterior cerebral arteries:  Patent. Middle cerebral arteries: Patent. Anterior communicating artery: Patent. Posterior communicating arteries: Patent left posterior communicating artery. No right kidney Posterior cerebral arteries: Patent. Apparent decreased flow related signal within the left posterior cerebral circulation is probably due to fetal PCA origin and signal saturation. No proximal stenosis is identified. Basilar artery:  Patent.  Proximal fenestration. Vertebral arteries:  Patent. No evidence of high-grade stenosis, large vessel occlusion, or aneurysm unless noted above. IMPRESSION: 1. No occlusion, aneurysm, dissection, or significant stenosis is identified. Mild motion artifact of the MRA sequence. No gross interval change. 2. No evidence of acute infarct, intracranial hemorrhage, focal mass effect, or hydrocephalus. Stable mild chronic microvascular ischemic changes and parenchymal volume loss. 3. No structural abnormality identified. Electronically Signed   By: Mitzi Hansen  M.D.   On: 04/19/2016 02:16   Dg Chest Port 1 View  Result Date: 04/18/2016 CLINICAL DATA:  65 year old male with central chest pain and shortness of breath for 3 days. Initial encounter. Former smoker. EXAM: PORTABLE CHEST 1 VIEW COMPARISON:  Chest radiographs 03/07/2014 and earlier. FINDINGS: Portable AP semi upright view at at 1306 hours. Mediastinal contours remain normal. Visualized tracheal air column is within normal limits. Mild increased interstitial markings have not significantly changed. No pneumothorax, pulmonary edema, pleural effusion or confluent pulmonary opacity. Multiple EKG leads and wires overlie the chest. Negative visible bowel gas pattern. IMPRESSION: No acute cardiopulmonary abnormality. Electronically Signed  By: Odessa Fleming M.D.   On: 04/18/2016 13:20   Ct Angio Chest/abd/pel For Dissection W And/or W/wo  Result Date: 04/19/2016 CLINICAL DATA:  65 year old with midline chest pain. EXAM: CT ANGIOGRAPHY CHEST, ABDOMEN AND PELVIS TECHNIQUE: Multidetector CT imaging through the chest, abdomen and pelvis was performed using the standard protocol during bolus administration of intravenous contrast. Multiplanar reconstructed images and MIPs were obtained and reviewed to evaluate the vascular anatomy. CONTRAST:  100 mL Isovue 370 COMPARISON:  03/07/2014 FINDINGS: CTA CHEST FINDINGS Vascular structures: Coronary artery calcifications. Normal caliber of the thoracic aorta without intramural hematoma or dissection. Ascending thoracic aorta measures up to 3.4 cm. 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 The right subclavian artery extends posterior to the esophagus and there is no aneurysm at the origin. Pulmonary arteries are patent and no evidence for pulmonary embolism. Mediastinum: There is no significant chest lymphadenopathy. Again noted are surgical changes at the GE junction and proximal stomach. Mild fullness of the distal esophagus is unchanged.  Lungs/pleural: Trachea and mainstem bronchi are patent. There are some blebs at the lung apices, particularly on the right side. Lungs are essentially clear. There are densities in the lower lobes that probably represent atelectasis. No large pleural effusions. No significant airspace disease or consolidation in the lungs. Musculoskeletal: Degenerative changes in thoracic spine. No acute bone abnormality. Review of the MIP images confirms the above findings. CTA ABDOMEN AND PELVIS FINDINGS Vascular: Normal caliber of the abdominal aorta without aneurysm. Normal caliber of the internal iliac arteries without aneurysm. Atherosclerotic disease in the abdominal aorta. Variant celiac trunk anatomy. The common hepatic artery originates directly from the aorta. The left gastric artery may have been ligated from previous gastric surgery. There is a prominent right gastric artery. Splenic artery is patent. Mild narrowing at the origin of the SMA. This SMA narrowing could be related to external compression or noncalcified plaque. This is probably not hemodynamically significant stenosis. Inferior mesenteric artery is patent. Bilateral renal arteries are patent. Diffuse atherosclerotic disease in the iliac arteries. Severe stenosis at the origin of the right SFA. Stenosis at the origin of the left SFA. There is a stent in the proximal left SFA which has some intra stent stenosis. Profunda femoral arteries are patent bilaterally. Liver/biliary: Gallbladder is moderately distended with multiple gallstones. No gross abnormality to the liver. No inflammatory changes around the gallbladder. Pancreas: Normal appearance of the pancreas without inflammation or duct dilatation. Spleen: Normal appearance of spleen without enlargement. Adrenal/urinary tract: Normal adrenal glands. Normal appearance of both kidneys and bilateral adrenal glands. No hydronephrosis. Normal appearance of the urinary bladder with mild distention. Bowel  structures: Extensive postoperative changes in the stomach most likely representing a gastric bypass procedure. There is no significant bowel dilatation and no evidence for obstruction. Lymphatics:  No suspicious lymphadenopathy. Reproductive: No gross abnormality to the prostate or seminal vesicles. Other: No free fluid in the pelvis. No free air. Fat containing inguinal hernias. Musculoskeletal:  No acute abnormality. Review of the MIP images confirms the above findings. IMPRESSION: Negative for an aortic dissection or aneurysm. Cholelithiasis. Postsurgical changes in stomach. 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 Variant vascular anatomy. 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 Variant celiac trunk anatomy as described. Atherosclerotic disease with stenosis in the bilateral superficial femoral arteries as described. Electronically Signed   By: Richarda Overlie M.D.   On: 04/19/2016 14:31    EKG & Cardiac Imaging    EKG: Sinus bradycardia,  HR 56, with no acute ST or T-wave changes.  Echocardiogram: 04/19/2016 Study Conclusions  - Left ventricle: The cavity size was normal. There was mild   concentric hypertrophy. Systolic function was normal. The   estimated ejection fraction was in the range of 55% to 60%. Wall   motion was normal; there were no regional wall motion   abnormalities. Doppler parameters are consistent with abnormal   left ventricular relaxation (grade 1 diastolic dysfunction).   Cardiac Catheterization: 03/09/2014 Hemodynamics: AO:  127/72   mmHg LV:  124/11    mmHg LVEDP: 15  mmHg  Coronary angiography:  Coronary dominance: Codominant    Left Main: Normal   Left Anterior Descending (LAD):   is normal in size with minor irregularities throughout its course.  1st diagonal (D1):   large in size with diffuse 20% proximal disease  2nd diagonal (D2):   very small in size.  3rd diagonal (D3):   very small in size.  Circumflex (LCx):   large in  size and codominant. A stent is noted in the midsegment with no significant restenosis. The stent is at the origin of the posterior AV groove artery.  1st obtuse marginal:   normal in size with minor irregularities.  2nd obtuse marginal:   normal in size with no significant disease.  3rd obtuse marginal:   small in size with no significant disease.                   AV groove continuation segment:  normal in size with 20% ostial stenosis. This bifurcates into 2 small posterolateral branches  Right Coronary Artery:  small in size and codominant. The vessel has no significant disease.  Posterior descending artery:  small in size with no significant disease.  Left ventriculography: Left ventricular systolic function is  normal , LVEF is estimated at  at 60% %, there is  no significant mitral regurgitation   Final Conclusions:    1. Widely patent stent in the mid left circumflex with no evidence of obstructive disease. 2. Normal LV systolic function with mildly elevated left ventricular end-diastolic pressure.  Recommendations:   continue medical therapy.  Assessment & Plan    1. Chest Pain with Mixed Typical and Atypical Features/ CAD - known CAD s/p DES to 2nd Mrg and LCx in 2009 with CVA occurring following the procedure, repeat cath in 03/2014 showing patent stents in LCx. - reports having chest discomfort for the past few weeks, noted with exertion and with rest. Associated with dyspnea. Still having 4-5/10 chest pain on exam while on IV NTG at 5020mcg/min. Says his IV morphine helps with the pain.  - chest is diffusely tender to palpation, unsure if it resembles prior symptoms in 2009. - cyclic troponin values have been negative. EKG shows sinus bradycardia, HR 56, with no acute ST or T-wave changes. Echocardiogram shows an EF of 55-60% with Grade 1 DD and no wall motion abnormalities. - pain on today's exam seems very atypical for a cardiac etiology with it being reproducible with  palpation. Symptoms with exertion over the past few weeks more concerning. With cath in 2015 showing patent stents and with him having a CVA in the setting of a cath in 2009, would not pursue a catheterization at this time, especially with his recurrent seizures and negative EKG's and negative troponin values. Could consider NST.   2. HLD - LDL 97 this admission (goal < 70). LFT's WNL this admission. - on Atorvastatin 80mg  daily PTA.  Would resume.   3. Focal Seizures - Neurology following.   4. PVD  - s/p L SFA stenting in 2014. - continue ASA and Plavix. Recommend re-initiation of statin therapy.   Signed, Ellsworth Lennox, PA-C 04/19/2016, 4:12 PM Pager: [phone]  Patient seen, examined. Available data reviewed. Agree with findings, assessment, and plan as outlined by Randall An, PA-C. Exam reveals an alert oriented male in NAD, lungs CTA< heart RRR without murmur, abdomen soft NT, extremities without edema. EKG sinus brady without significant ST-T changes and troponin negative. He has had prolonged somewhat atypic al chest discomfort. Currently undergoing an EEG for seizure disorder. Has had multiple seizures recently. Cardiac history reviewed. Last cath 2015 showed no obstructive disease and widely patent stent site. ACS is unlikely with this clinical presentation. However, he does note a recent increase in his chest discomfort but no clear exertional component. Recommend a Lexiscan Myoview stress test for risk stratification - will arrange for tomorrow am. Stop NTG. Cardiology team will FU after stress test.  Tonny Bollman, M.D. 04/19/2016 6:23 PM

## 2016-04-19 NOTE — Care Management Note (Signed)
Case Management Note  Patient Details  Name: Justin Wells MRN: [id number] Date of Birth: [birth year]  Subjective/Objective:   Pt admitted with chest pain .    Action/Plan:  CM B.C assessed pt this am - pt is from home with wife.  CM will continue to follow for discharge needs   Expected Discharge Date:                  Expected Discharge Plan:     In-House Referral:     Discharge planning Services  CM Consult  Post Acute Care Choice:    Choice offered to:     DME Arranged:    DME Agency:     HH Arranged:    HH Agency:     Status of Service:  In process, will continue to follow  If discussed at Long Length of Stay Meetings, dates discussed:    Additional Comments:  Cherylann ParrClaxton, Tyshawn Ciullo S, RN 04/19/2016, 11:25 AM

## 2016-04-19 NOTE — Progress Notes (Signed)
Echocardiogram 2D Echocardiogram has been performed.  Justin Wells, Justin Wells M 04/19/2016, 3:23 PM

## 2016-04-19 NOTE — Care Management Obs Status (Signed)
MEDICARE OBSERVATION STATUS NOTIFICATION   Patient Details  Name: Justin Wells MRN: [id number] Date of Birth: 12-Apr-1951   Medicare Observation Status Notification Given:  Yes    Cherylann ParrClaxton, Nijee Heatwole S, RN 04/19/2016, 1:58 PM

## 2016-04-19 NOTE — Procedures (Signed)
ELECTROENCEPHALOGRAM REPORT  Date of Study: 04/19/2016  Patient's Name: Justin Wells MRN: [id number] Date of Birth: Jan 08, 1951  Referring Provider: Ritta SlotMcNeill Kirkpatrick, MD  Clinical History: 65 y.o. male with past medical history significant for coronary artery disease, stroke, hypertension, CAD and seizure disorder, presents with increased severity and frequency of seizures.  Medications:  acetaminophen (TYLENOL) tablet 650 mg   aspirin chewable tablet 324 mg   clonazePAM (KLONOPIN) tablet 0.25 mg   clopidogrel (PLAVIX) tablet 75 mg   enoxaparin (LOVENOX) injection 40 mg   feeding supplement (ENSURE ENLIVE) (ENSURE ENLIVE) liquid 237 mL   gi cocktail (Maalox,Lidocaine,Donnatal)   HYDROmorphone (DILAUDID) tablet 4 mg   levETIRAcetam (KEPPRA) tablet 750 mg   lisinopril (PRINIVIL,ZESTRIL) tablet 5 mg   morphine (MS CONTIN) 12 hr tablet 15 mg   morphine 2 MG/ML injection 2 mg   nitroGLYCERIN (NITROSTAT) SL tablet 0.4 mg   nitroGLYCERIN 50 mg in dextrose 5 % 250 mL (0.2 mg/mL) infusion   ondansetron (ZOFRAN) injection 4 mg   pantoprazole (PROTONIX) EC tablet 40 mg   PARoxetine (PAXIL) tablet 20 mg  Technical Summary: A multichannel digital EEG recording measured by the international 10-20 system with electrodes applied with paste and impedances below 5000 ohms performed in our laboratory with EKG monitoring in an awake and asleep patient.  Hyperventilation and photic stimulation were not performed.  The digital EEG was referentially recorded, reformatted, and digitally filtered in a variety of bipolar and referential montages for optimal display.    Description: The patient is awake and asleep during the recording.  During maximal wakefulness, there is a symmetric, low voltage 10 Hz posterior dominant rhythm that attenuates with eye opening.  The record is symmetric.  During drowsiness and sleep, there is an increase in theta slowing of the background.  Vertex waves and symmetric  sleep spindles were seen.  There were no epileptiform discharges or electrographic seizures seen.    EKG lead was unremarkable.  Impression: This awake and asleep EEG is normal.    Clinical Correlation: A normal EEG does not exclude a clinical diagnosis of epilepsy.  If further clinical questions remain, prolonged EEG may be helpful.  Clinical correlation is advised.   Shon MilletAdam Jaffe, DO

## 2016-04-19 NOTE — Progress Notes (Signed)
LTM EEG started. Pt and wife, staff educated regarding event button.

## 2016-04-19 NOTE — Progress Notes (Signed)
Subjective: Continues to have spells as well as chest pain  Exam: Vitals:   04/19/16 0753 04/19/16 1153  BP:  112/73  Pulse: 66 67  Resp: 19 18  Temp: 97.9 F (36.6 C) 98.1 F (36.7 C)   Gen: In bed, NAD Resp: non-labored breathing, no acute distress Abd: soft, nt  Neuro: MS: Awake, alert, interactive and approved CN: Face symmetric Motor: moves all extremities well  Pertinent Labs: Normal creatinine  Impression: 65 year old male with recurrent spells of unclear etiology. My suspicion for the ones prior to the past week would be that they are partial seizures, however with the chest pain and semiology resembling presyncope, I do not think a cardiac cause of these events is possible to rule out this time. Given his symptoms, I have ordered a CTA  to rule out dissection.   Given that his spells are occurring daily, we could capture one with EEG to answer the question.  Recommendations: 1) overnight EEG 2) continue Keppra 3) agree with cardiology consultation.  Ritta SlotMcNeill Vesper Trant, MD [facility] [phone]  If 7pm- 7am, please page neurology on call as listed in [facility].

## 2016-04-19 NOTE — Progress Notes (Signed)
EEG Completed; Results Pending  

## 2016-04-19 NOTE — Care Management Obs Status (Signed)
MEDICARE OBSERVATION STATUS NOTIFICATION   Patient Details  Name: Justin ChimesGeorge F Dreyfuss MRN: [id number] Date of Birth: 16-Aug-1951   Medicare Observation Status Notification Given:       Cherylann ParrClaxton, Tiahna Cure S, RN 04/19/2016, 1:58 PM

## 2016-04-19 NOTE — Progress Notes (Signed)
With the clients continued chest pain last night I called MD for a more current Tropin this morning, this order was placed. This level has not resulted. I will pass to day shift nurse about pending results and will continue to monitor closely.   Sheppard Evensina Lazer Wollard RN

## 2016-04-19 NOTE — Progress Notes (Signed)
Initial Nutrition Assessment  DOCUMENTATION CODES:   Not applicable  INTERVENTION:   -Once diet is resumed: Ensure Enlive po BID, each supplement provides 350 kcal and 20 grams of protein  NUTRITION DIAGNOSIS:   Inadequate oral intake related to inability to eat as evidenced by NPO status.  GOAL:   Patient will meet greater than or equal to 90% of their needs  MONITOR:   PO intake, Supplement acceptance, Diet advancement, Labs, Weight trends, Skin, I & O's  REASON FOR ASSESSMENT:   Malnutrition Screening Tool    ASSESSMENT:   Justin Wells is a 65 y.o. male with past medical history significant for coronary artery disease, stroke, hypertension, heart attack, coronary artery stents presented to the emergency room with a chief complaint of recurrent seizures. Patient typically does not have daily Seizures but Has Had 1 a Day since Sunday. Patient Is Also Had an Increase in the Severity of His Seizures  Pt admitted with chest pain and seizures.   Hx obtained mainly from pt wife at bedside. She reports that pt has a very heart appetite ("he snacks constantly"). Pt reports he often consumes 5-6 times per day and consumes a wide variety of foods. Noted breakfast tray on tray table untouched. Per pt wife, diet will be held until pt is seen by cardiology.   Pt endorses weight loss, however, unable to provide specifics related to time frame and amount of weight loss. Per wt hx, UBW usually ranges between 197-205#. Noted inconsistencies with weights obtained during this hospitalization (189-194#). Per physical assessment, it does not appear that pt has lost a significant amount of weight.  Nutrition-Focused physical exam completed. Findings are no fat depletion, no muscle depletion, and no edema. Per pt wife, pt is still able to perform at baseline activity level but gets fatigued more easily.   Pt and wife amenable to Esnure supplements when diet is resumed.   Labs reviewed.    Diet Order:  Diet Heart Room service appropriate? Yes; Fluid consistency: Thin  Skin:  Reviewed, no issues  Last BM:  PTA  Height:   Ht Readings from Last 1 Encounters:  04/18/16 5\' 10"  (1.778 m)    Weight:   Wt Readings from Last 1 Encounters:  04/19/16 189 lb 1.6 oz (85.8 kg)    Ideal Body Weight:  75.5 kg  BMI:  Body mass index is 27.13 kg/m.  Estimated Nutritional Needs:   Kcal:  1900-2100  Protein:  95-110 grams  Fluid:  1.9-2.1 L  EDUCATION NEEDS:   Education needs addressed  Harshal Sirmon A. Mayford KnifeWilliams, RD, LDN, CDE Pager: [phone] After hours Pager: [phone]

## 2016-04-20 ENCOUNTER — Observation Stay (HOSPITAL_COMMUNITY): Payer: Medicare HMO

## 2016-04-20 ENCOUNTER — Observation Stay (HOSPITAL_BASED_OUTPATIENT_CLINIC_OR_DEPARTMENT_OTHER): Payer: Medicare HMO

## 2016-04-20 DIAGNOSIS — R079 Chest pain, unspecified: Secondary | ICD-10-CM | POA: Diagnosis not present

## 2016-04-20 DIAGNOSIS — R569 Unspecified convulsions: Secondary | ICD-10-CM | POA: Diagnosis not present

## 2016-04-20 DIAGNOSIS — R072 Precordial pain: Secondary | ICD-10-CM | POA: Diagnosis not present

## 2016-04-20 DIAGNOSIS — R0789 Other chest pain: Secondary | ICD-10-CM

## 2016-04-20 DIAGNOSIS — R1013 Epigastric pain: Secondary | ICD-10-CM | POA: Diagnosis not present

## 2016-04-20 DIAGNOSIS — F411 Generalized anxiety disorder: Secondary | ICD-10-CM | POA: Diagnosis not present

## 2016-04-20 DIAGNOSIS — G894 Chronic pain syndrome: Secondary | ICD-10-CM | POA: Diagnosis not present

## 2016-04-20 LAB — RAPID URINE DRUG SCREEN, HOSP PERFORMED
AMPHETAMINES: NOT DETECTED
BARBITURATES: NOT DETECTED
BENZODIAZEPINES: NOT DETECTED
Cocaine: NOT DETECTED
Opiates: POSITIVE — AB
Tetrahydrocannabinol: NOT DETECTED

## 2016-04-20 LAB — NM MYOCAR MULTI W/SPECT W/WALL MOTION / EF
CSEPHR: 56 %
Estimated workload: 1 METS
MPHR: 157 {beats}/min
Peak HR: 88 {beats}/min
Rest HR: 60 {beats}/min

## 2016-04-20 LAB — COMPREHENSIVE METABOLIC PANEL
ALK PHOS: 65 U/L (ref 38–126)
ALT: 17 U/L (ref 17–63)
ANION GAP: 7 (ref 5–15)
AST: 18 U/L (ref 15–41)
Albumin: 3.6 g/dL (ref 3.5–5.0)
BILIRUBIN TOTAL: 0.7 mg/dL (ref 0.3–1.2)
BUN: 9 mg/dL (ref 6–20)
CALCIUM: 8.8 mg/dL — AB (ref 8.9–10.3)
CO2: 26 mmol/L (ref 22–32)
Chloride: 105 mmol/L (ref 101–111)
Creatinine, Ser: 0.81 mg/dL (ref 0.61–1.24)
Glucose, Bld: 109 mg/dL — ABNORMAL HIGH (ref 65–99)
Potassium: 3.7 mmol/L (ref 3.5–5.1)
Sodium: 138 mmol/L (ref 135–145)
TOTAL PROTEIN: 6.2 g/dL — AB (ref 6.5–8.1)

## 2016-04-20 LAB — CBC
HEMATOCRIT: 41.9 % (ref 39.0–52.0)
HEMOGLOBIN: 14.1 g/dL (ref 13.0–17.0)
MCH: 29.3 pg (ref 26.0–34.0)
MCHC: 33.7 g/dL (ref 30.0–36.0)
MCV: 86.9 fL (ref 78.0–100.0)
Platelets: 191 10*3/uL (ref 150–400)
RBC: 4.82 MIL/uL (ref 4.22–5.81)
RDW: 13.3 % (ref 11.5–15.5)
WBC: 6.9 10*3/uL (ref 4.0–10.5)

## 2016-04-20 LAB — HEMOGLOBIN A1C
HEMOGLOBIN A1C: 5.3 % (ref 4.8–5.6)
MEAN PLASMA GLUCOSE: 105 mg/dL

## 2016-04-20 MED ORDER — TECHNETIUM TC 99M TETROFOSMIN IV KIT
10.0000 | PACK | Freq: Once | INTRAVENOUS | Status: AC | PRN
  Administered 2016-04-20: 10 via INTRAVENOUS

## 2016-04-20 MED ORDER — LORAZEPAM 2 MG/ML IJ SOLN
1.0000 mg | Freq: Once | INTRAMUSCULAR | Status: AC
  Administered 2016-04-20: 1 mg via INTRAVENOUS
  Filled 2016-04-20: qty 1

## 2016-04-20 MED ORDER — REGADENOSON 0.4 MG/5ML IV SOLN
INTRAVENOUS | Status: AC
  Filled 2016-04-20: qty 5

## 2016-04-20 MED ORDER — TECHNETIUM TC 99M TETROFOSMIN IV KIT
30.0000 | PACK | Freq: Once | INTRAVENOUS | Status: AC | PRN
  Administered 2016-04-20: 30 via INTRAVENOUS

## 2016-04-20 MED ORDER — REGADENOSON 0.4 MG/5ML IV SOLN
0.4000 mg | Freq: Once | INTRAVENOUS | Status: AC
  Administered 2016-04-20: 0.4 mg via INTRAVENOUS
  Filled 2016-04-20: qty 5

## 2016-04-20 NOTE — Procedures (Signed)
  Electroencephalogram report- LTM   Data acquisition: 10-20 electrode placement.  Additional T1, T2, and EKG electrodes; 26 channel digital referential acquisition reformatted to 18 channel/7 channel coronal bipolar      Beginning time: 04/19/16 at 4 38 pm Ending time:04/20/16 at 08 23 12 am   Day of study: day 1    This 18  hours of intensive EEG monitoring with simultaneous video monitoring was performed for this patient with spells  Of unclear etiologyas a part of ongoing series to capture events of interest and determine if these are seizures.    Medications: as per emr There was no pushbutton activations events during this recording.  Automated spike detection program did not detect any spikes the.  Seizure detection program did not detect any seizures .   Waking background activities were marked by 9  cps posterior dominant symmetric synchronized alpha rhythm which tends to attenuate with eyes opening.  Patient had normal drowsiness and sleep architecture.  There was no epileptiform discharges clinical or subclinical seizures present.    Clinical interpretation: This 18  hours of intensive EEG monitoring with simultaneous monitoring did not record any clinical or subclinical seizures. No events of interest were recorded.  Background activities were normal during wakefulness drowsiness and sleep throughout the recording.

## 2016-04-20 NOTE — Progress Notes (Signed)
The patient was seen in nuclear medicine for a Lexiscan myoview. He tolerated the procedure well. No acute ST or TW changes on ECG.    STRESS IMAGES TO FOLLOW  Erin Smith, NP  [facility]   

## 2016-04-20 NOTE — Progress Notes (Signed)
[facility] PROGRESS NOTE  Justin Wells WJX:[id number] DOB: October 28, 1950 DOA: 04/18/2016   PCP: [name],[name], MD     Assessment/Plan: Principal Problem:   Chest pain Active Problems:   Hypertension   Anxiety state   Chronic pain syndrome   Urgency of micturation   Seizure (HCC)   Epigastric pain   Spells (HCC)   Justin Wells is a 65 y.o. male with past medical history significant for coronary artery disease, stroke, hypertension, heart attack, coronary artery stents placed in 2009, presented to the emergency room with a chief complaint of recurrent seizures. Patient typically does not have daily Seizures but Has Had 1 a Day since Sunday. Patient Is Also Had an Increase in the Severity of His Seizures. Patient Had One Today Lasting over a Minute and Then This Seizure Became Very Diaphoretic. This Is an Atypical for the Way He Presents with Seizure. The Only Thing Is Different Is the Frequency. Patient Follows up with His Neurologist Regularly. Abdomen No Changes in His Medications. Patient Denies Any Head Trauma. Patient Denies Any recent Illnesses. Patient Denies Any Recent Medication changes.  Assessment and plan Chest Pain with Mixed Typical and Atypical Features/ CAD - known CAD s/p DES to 2nd Mrg and LCx in 2009 with CVA occurring following the procedure, repeat cath in 03/2014 showing patent stents in LCx. chest pain on exam while on IV NTG at 6720mcg/min. Says his IV morphine helps with the pain.  - chest is diffusely tender to palpation, unsure if it resembles prior symptoms in 2009. - cyclic troponin values have been negative. EKG shows sinus bradycardia, HR 56, with no acute ST or T-wave changes. Echocardiogram shows an EF of 55-60% with Grade 1 DD and no wall motion abnormalities. -  With cath in 2015 showing patent stents and with him having a CVA in the setting of a cath in 2009, would not pursue a catheterization at this time as per cardiology, especially with  his recurrent seizures and negative EKG's and negative troponin values. Patient had CT chest abdomen pelvis yesterday which was negative for aortic dissection or aneurysm, cardiology has arranged for a nuclear stress test today    focal seizures as opposed VS  TIA Patient has history of absent seizures with increased frequency recently Dr. Petra KubaKilpatrick (neurology) evaluated the patient, ordered EEG, MRI of the brain, MRA head, which were within normal limits Continue Keppra to 750 mg twice a day, could increase further MRI/MRA of the brain negative Chronically on MS Contin at home MRI of the C-spine to rule out cervical radiculopathy   Hypertension Lisinopril 5 mg daily  Hyperlipidemia Patient off this medication will check lipid panel  Chronic pain Continue home MS Contin & dilaudid Continuous pulse ox  Epigastric tenderness Nexium->Protonix 40 mg by mouth daily Cholelithiasis on CT abdomen, right upper quadrant ultrasound to rule out cholecystitis   Anxiety Klonopin 0.5 mg by mouth twice a day when necessary Paxil 20 mg every morning   Hypoglycemia-check hemoglobin A1c   DVT prophylaxsis Lovenox  Code Status:  Full code      Family Communication: Discussed in detail with the patient, all imaging results, lab results explained to the patient   Disposition Plan:  Cardiology consult     Consultants:  Neurology  Cardiology  Procedures: 2-D echo  Antibiotics: Anti-infectives    None         HPI/Subjective: Patient not complaining of left shoulder pain radiating down into her  left arm  Objective: Vitals:   04/20/16 1117 04/20/16 1142 04/20/16 1144 04/20/16 1146  BP: 133/77 115/76 115/72 117/69  Pulse:      Resp:      Temp:      TempSrc:      SpO2:      Weight:      Height:        Intake/Output Summary (Last 24 hours) at 04/20/16 1150 Last data filed at 04/20/16 0900  Gross per 24 hour  Intake              480 ml  Output              1675 ml  Net            -1195 ml    Exam:  Examination:  General exam: Appears calm and comfortable  Respiratory system: Clear to auscultation. Respiratory effort normal. Cardiovascular system: S1 & S2 heard, RRR. No JVD, murmurs, rubs, gallops or clicks. No pedal edema. Gastrointestinal system: Abdomen is nondistended, soft and nontender. No organomegaly or masses felt. Normal bowel sounds heard. Central nervous system: Alert and oriented. No focal neurological deficits. Extremities: Symmetric 5 x 5 power. Skin: No rashes, lesions or ulcers Psychiatry: Judgement and insight appear normal. Mood & affect appropriate.     Data Reviewed: I have personally reviewed following labs and imaging studies  Micro Results No results found for this or any previous visit (from the past 240 hour(s)).  Radiology Reports Dg Abd 1 View  Result Date: 04/19/2016 CLINICAL DATA:  Acute onset of epigastric abdominal pain. Initial encounter. EXAM: ABDOMEN - 1 VIEW COMPARISON:  Chest and abdominal radiographs performed 09/02/2008 FINDINGS: The visualized bowel gas pattern is unremarkable. Scattered air and stool filled loops of colon are seen; no abnormal dilatation of small bowel loops is seen to suggest small bowel obstruction. No free intra-abdominal air is identified, though evaluation for free air is limited on a single supine view. The visualized osseous structures are within normal limits; the sacroiliac joints are unremarkable in appearance. IMPRESSION: Unremarkable bowel gas pattern; no free intra-abdominal air seen. Moderate amount of stool noted in the colon. Electronically Signed   By: Roanna Raider M.D.   On: 04/19/2016 02:06   Mr Maxine Glenn Head Wo Contrast  Result Date: 04/19/2016 CLINICAL DATA:  65 y/o M; PE recurrence. Type spells for several years with feeling of dizziness and confusion. Coal the concern for posterior circulation hypoperfusion versus seizures. EXAM: MRI HEAD WITHOUT CONTRAST MRA  HEAD WITHOUT CONTRAST TECHNIQUE: Multiplanar, multiecho pulse sequences of the brain and surrounding structures were obtained without intravenous contrast. Angiographic images of the head were obtained using MRA technique without contrast. COMPARISON:  MRI of the brain dated 04/13/2015. MRA of the head dated 12/27/2013. FINDINGS: MRI HEAD FINDINGS Brain: No diffusion restriction to suggest acute or early subacute infarct. No abnormal susceptibility hypointensity to indicate intracranial hemorrhage. Mild chronic microvascular ischemic changes are stable. No focal mass effect. No gross structural abnormality, disorder cortical formation, or gray matter heterotopia is identified. Hippocampi by are symmetric in size bilaterally. Extra-axial space: Normal ventricular size. No midline shift. No effacement of basilar cisterns. No extra-axial collection is identified. Proximal intracranial flow voids are maintained. No abnormality of the cervical medullary junction. Other: Mild diffuse paranasal sinus mucosal thickening. Pain partial opacification of posterior ethmoid air cells. No abnormal signal of the mastoid air cells. Orbits are unremarkable. Calvarium is unremarkable. MRA HEAD FINDINGS Internal carotid arteries:  Patent. Anterior  cerebral arteries:  Patent. Middle cerebral arteries: Patent. Anterior communicating artery: Patent. Posterior communicating arteries: Patent left posterior communicating artery. No right kidney Posterior cerebral arteries: Patent. Apparent decreased flow related signal within the left posterior cerebral circulation is probably due to fetal PCA origin and signal saturation. No proximal stenosis is identified. Basilar artery:  Patent.  Proximal fenestration. Vertebral arteries:  Patent. No evidence of high-grade stenosis, large vessel occlusion, or aneurysm unless noted above. IMPRESSION: 1. No occlusion, aneurysm, dissection, or significant stenosis is identified. Mild motion artifact of the  MRA sequence. No gross interval change. 2. No evidence of acute infarct, intracranial hemorrhage, focal mass effect, or hydrocephalus. Stable mild chronic microvascular ischemic changes and parenchymal volume loss. 3. No structural abnormality identified. Electronically Signed   By: Mitzi Hansen M.D.   On: 04/19/2016 02:16   Mr Brain Wo Contrast  Result Date: 04/19/2016 CLINICAL DATA:  65 y/o M; PE recurrence. Type spells for several years with feeling of dizziness and confusion. Coal the concern for posterior circulation hypoperfusion versus seizures. EXAM: MRI HEAD WITHOUT CONTRAST MRA HEAD WITHOUT CONTRAST TECHNIQUE: Multiplanar, multiecho pulse sequences of the brain and surrounding structures were obtained without intravenous contrast. Angiographic images of the head were obtained using MRA technique without contrast. COMPARISON:  MRI of the brain dated 04/13/2015. MRA of the head dated 12/27/2013. FINDINGS: MRI HEAD FINDINGS Brain: No diffusion restriction to suggest acute or early subacute infarct. No abnormal susceptibility hypointensity to indicate intracranial hemorrhage. Mild chronic microvascular ischemic changes are stable. No focal mass effect. No gross structural abnormality, disorder cortical formation, or gray matter heterotopia is identified. Hippocampi by are symmetric in size bilaterally. Extra-axial space: Normal ventricular size. No midline shift. No effacement of basilar cisterns. No extra-axial collection is identified. Proximal intracranial flow voids are maintained. No abnormality of the cervical medullary junction. Other: Mild diffuse paranasal sinus mucosal thickening. Pain partial opacification of posterior ethmoid air cells. No abnormal signal of the mastoid air cells. Orbits are unremarkable. Calvarium is unremarkable. MRA HEAD FINDINGS Internal carotid arteries:  Patent. Anterior cerebral arteries:  Patent. Middle cerebral arteries: Patent. Anterior communicating  artery: Patent. Posterior communicating arteries: Patent left posterior communicating artery. No right kidney Posterior cerebral arteries: Patent. Apparent decreased flow related signal within the left posterior cerebral circulation is probably due to fetal PCA origin and signal saturation. No proximal stenosis is identified. Basilar artery:  Patent.  Proximal fenestration. Vertebral arteries:  Patent. No evidence of high-grade stenosis, large vessel occlusion, or aneurysm unless noted above. IMPRESSION: 1. No occlusion, aneurysm, dissection, or significant stenosis is identified. Mild motion artifact of the MRA sequence. No gross interval change. 2. No evidence of acute infarct, intracranial hemorrhage, focal mass effect, or hydrocephalus. Stable mild chronic microvascular ischemic changes and parenchymal volume loss. 3. No structural abnormality identified. Electronically Signed   By: Mitzi Hansen M.D.   On: 04/19/2016 02:16   Dg Chest Port 1 View  Result Date: 04/18/2016 CLINICAL DATA:  65 year old male with central chest pain and shortness of breath for 3 days. Initial encounter. Former smoker. EXAM: PORTABLE CHEST 1 VIEW COMPARISON:  Chest radiographs 03/07/2014 and earlier. FINDINGS: Portable AP semi upright view at at 1306 hours. Mediastinal contours remain normal. Visualized tracheal air column is within normal limits. Mild increased interstitial markings have not significantly changed. No pneumothorax, pulmonary edema, pleural effusion or confluent pulmonary opacity. Multiple EKG leads and wires overlie the chest. Negative visible bowel gas pattern. IMPRESSION: No acute cardiopulmonary abnormality. Electronically Signed  By: Odessa Fleming M.D.   On: 04/18/2016 13:20   Ct Angio Chest/abd/pel For Dissection W And/or W/wo  Result Date: 04/19/2016 CLINICAL DATA:  65 year old with midline chest pain. EXAM: CT ANGIOGRAPHY CHEST, ABDOMEN AND PELVIS TECHNIQUE: Multidetector CT imaging through the  chest, abdomen and pelvis was performed using the standard protocol during bolus administration of intravenous contrast. Multiplanar reconstructed images and MIPs were obtained and reviewed to evaluate the vascular anatomy. CONTRAST:  100 mL Isovue 370 COMPARISON:  03/07/2014 FINDINGS: CTA CHEST FINDINGS Vascular structures: Coronary artery calcifications. Normal caliber of the thoracic aorta without intramural hematoma or dissection. Ascending thoracic aorta measures up to 3.4 cm. 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 The right subclavian artery extends posterior to the esophagus and there is no aneurysm at the origin. Pulmonary arteries are patent and no evidence for pulmonary embolism. Mediastinum: There is no significant chest lymphadenopathy. Again noted are surgical changes at the GE junction and proximal stomach. Mild fullness of the distal esophagus is unchanged. Lungs/pleural: Trachea and mainstem bronchi are patent. There are some blebs at the lung apices, particularly on the right side. Lungs are essentially clear. There are densities in the lower lobes that probably represent atelectasis. No large pleural effusions. No significant airspace disease or consolidation in the lungs. Musculoskeletal: Degenerative changes in thoracic spine. No acute bone abnormality. Review of the MIP images confirms the above findings. CTA ABDOMEN AND PELVIS FINDINGS Vascular: Normal caliber of the abdominal aorta without aneurysm. Normal caliber of the internal iliac arteries without aneurysm. Atherosclerotic disease in the abdominal aorta. Variant celiac trunk anatomy. The common hepatic artery originates directly from the aorta. The left gastric artery may have been ligated from previous gastric surgery. There is a prominent right gastric artery. Splenic artery is patent. Mild narrowing at the origin of the SMA. This SMA narrowing could be related to external compression or noncalcified  plaque. This is probably not hemodynamically significant stenosis. Inferior mesenteric artery is patent. Bilateral renal arteries are patent. Diffuse atherosclerotic disease in the iliac arteries. Severe stenosis at the origin of the right SFA. Stenosis at the origin of the left SFA. There is a stent in the proximal left SFA which has some intra stent stenosis. Profunda femoral arteries are patent bilaterally. Liver/biliary: Gallbladder is moderately distended with multiple gallstones. No gross abnormality to the liver. No inflammatory changes around the gallbladder. Pancreas: Normal appearance of the pancreas without inflammation or duct dilatation. Spleen: Normal appearance of spleen without enlargement. Adrenal/urinary tract: Normal adrenal glands. Normal appearance of both kidneys and bilateral adrenal glands. No hydronephrosis. Normal appearance of the urinary bladder with mild distention. 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 There is no significant bowel dilatation and no evidence for obstruction. Lymphatics:  No suspicious lymphadenopathy. Reproductive: No gross abnormality to the prostate or seminal vesicles. Other: No free fluid in the pelvis. No free air. Fat containing inguinal hernias. Musculoskeletal:  No acute abnormality. Review of the MIP images confirms the above findings. IMPRESSION: Negative for an aortic dissection or aneurysm. Cholelithiasis. Postsurgical changes in stomach. 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 Variant vascular anatomy. 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 Variant celiac trunk anatomy as described. Atherosclerotic disease with stenosis in the bilateral superficial femoral arteries as described. Electronically Signed   By: Richarda Overlie M.D.   On: 04/19/2016 14:31     CBC  Recent Labs Lab 04/18/16 1302 04/18/16 2206  04/20/16 0406  WBC 9.4 8.1 6.9  HGB 13.5  13.8 14.1  HCT 40.2 41.2 41.9  PLT 206 208 191  MCV 86.8 88.6 86.9  MCH 29.2 29.7 29.3  MCHC 33.6 33.5 33.7  RDW 13.2 13.4 13.3    Chemistries   Recent Labs Lab 04/18/16 1302 04/18/16 2206 04/20/16 0406  NA 137  --  138  K 3.5  --  3.7  CL 105  --  105  CO2 25  --  26  GLUCOSE 102*  --  109*  BUN 8  --  9  CREATININE 0.94 1.08 0.81  CALCIUM 8.7*  --  8.8*  AST 21  --  18  ALT 20  --  17  ALKPHOS 64  --  65  BILITOT 0.6  --  0.7   ------------------------------------------------------------------------------------------------------------------ estimated creatinine clearance is 96.4 mL/min (by C-G formula based on SCr of 0.81 mg/dL). ------------------------------------------------------------------------------------------------------------------  Recent Labs  04/19/16 0530  HGBA1C 5.3   ------------------------------------------------------------------------------------------------------------------  Recent Labs  04/18/16 1919  CHOL 157  HDL 39*  LDLCALC 97  TRIG 045  CHOLHDL 4.0   ------------------------------------------------------------------------------------------------------------------  Recent Labs  04/19/16 1151  TSH 0.510   ------------------------------------------------------------------------------------------------------------------ No results for input(s): VITAMINB12, FOLATE, FERRITIN, TIBC, IRON, RETICCTPCT in the last 72 hours.  Coagulation profile No results for input(s): INR, PROTIME in the last 168 hours.  No results for input(s): DDIMER in the last 72 hours.  Cardiac Enzymes  Recent Labs Lab 04/18/16 2206 04/19/16 0653  TROPONINI <0.03 <0.03   ------------------------------------------------------------------------------------------------------------------ Invalid input(s): POCBNP   CBG:  Recent Labs Lab 04/18/16 1238  GLUCAP 92       Studies: Dg Abd 1 View  Result Date: 04/19/2016 CLINICAL DATA:  Acute  onset of epigastric abdominal pain. Initial encounter. EXAM: ABDOMEN - 1 VIEW COMPARISON:  Chest and abdominal radiographs performed 09/02/2008 FINDINGS: The visualized bowel gas pattern is unremarkable. Scattered air and stool filled loops of colon are seen; no abnormal dilatation of small bowel loops is seen to suggest small bowel obstruction. No free intra-abdominal air is identified, though evaluation for free air is limited on a single supine view. The visualized osseous structures are within normal limits; the sacroiliac joints are unremarkable in appearance. IMPRESSION: Unremarkable bowel gas pattern; no free intra-abdominal air seen. Moderate amount of stool noted in the colon. Electronically Signed   By: Roanna Raider M.D.   On: 04/19/2016 02:06   Mr Maxine Glenn Head Wo Contrast  Result Date: 04/19/2016 CLINICAL DATA:  65 y/o M; PE recurrence. Type spells for several years with feeling of dizziness and confusion. Coal the concern for posterior circulation hypoperfusion versus seizures. EXAM: MRI HEAD WITHOUT CONTRAST MRA HEAD WITHOUT CONTRAST TECHNIQUE: Multiplanar, multiecho pulse sequences of the brain and surrounding structures were obtained without intravenous contrast. Angiographic images of the head were obtained using MRA technique without contrast. COMPARISON:  MRI of the brain dated 04/13/2015. MRA of the head dated 12/27/2013. FINDINGS: MRI HEAD FINDINGS Brain: No diffusion restriction to suggest acute or early subacute infarct. No abnormal susceptibility hypointensity to indicate intracranial hemorrhage. Mild chronic microvascular ischemic changes are stable. No focal mass effect. No gross structural abnormality, disorder cortical formation, or gray matter heterotopia is identified. Hippocampi by are symmetric in size bilaterally. Extra-axial space: Normal ventricular size. No midline shift. No effacement of basilar cisterns. No extra-axial collection is identified. Proximal intracranial flow voids  are maintained. No abnormality of the cervical medullary junction. Other: Mild diffuse paranasal sinus mucosal thickening.  Pain partial opacification of posterior ethmoid air cells. No abnormal signal of the mastoid air cells. Orbits are unremarkable. Calvarium is unremarkable. MRA HEAD FINDINGS Internal carotid arteries:  Patent. Anterior cerebral arteries:  Patent. Middle cerebral arteries: Patent. Anterior communicating artery: Patent. Posterior communicating arteries: Patent left posterior communicating artery. No right kidney Posterior cerebral arteries: Patent. Apparent decreased flow related signal within the left posterior cerebral circulation is probably due to fetal PCA origin and signal saturation. No proximal stenosis is identified. Basilar artery:  Patent.  Proximal fenestration. Vertebral arteries:  Patent. No evidence of high-grade stenosis, large vessel occlusion, or aneurysm unless noted above. IMPRESSION: 1. No occlusion, aneurysm, dissection, or significant stenosis is identified. Mild motion artifact of the MRA sequence. No gross interval change. 2. No evidence of acute infarct, intracranial hemorrhage, focal mass effect, or hydrocephalus. Stable mild chronic microvascular ischemic changes and parenchymal volume loss. 3. No structural abnormality identified. Electronically Signed   By: Mitzi Hansen M.D.   On: 04/19/2016 02:16   Mr Brain Wo Contrast  Result Date: 04/19/2016 CLINICAL DATA:  65 y/o M; PE recurrence. Type spells for several years with feeling of dizziness and confusion. Coal the concern for posterior circulation hypoperfusion versus seizures. EXAM: MRI HEAD WITHOUT CONTRAST MRA HEAD WITHOUT CONTRAST TECHNIQUE: Multiplanar, multiecho pulse sequences of the brain and surrounding structures were obtained without intravenous contrast. Angiographic images of the head were obtained using MRA technique without contrast. COMPARISON:  MRI of the brain dated 04/13/2015. MRA of  the head dated 12/27/2013. FINDINGS: MRI HEAD FINDINGS Brain: No diffusion restriction to suggest acute or early subacute infarct. No abnormal susceptibility hypointensity to indicate intracranial hemorrhage. Mild chronic microvascular ischemic changes are stable. No focal mass effect. No gross structural abnormality, disorder cortical formation, or gray matter heterotopia is identified. Hippocampi by are symmetric in size bilaterally. Extra-axial space: Normal ventricular size. No midline shift. No effacement of basilar cisterns. No extra-axial collection is identified. Proximal intracranial flow voids are maintained. No abnormality of the cervical medullary junction. Other: Mild diffuse paranasal sinus mucosal thickening. Pain partial opacification of posterior ethmoid air cells. No abnormal signal of the mastoid air cells. Orbits are unremarkable. Calvarium is unremarkable. MRA HEAD FINDINGS Internal carotid arteries:  Patent. Anterior cerebral arteries:  Patent. Middle cerebral arteries: Patent. Anterior communicating artery: Patent. Posterior communicating arteries: Patent left posterior communicating artery. No right kidney Posterior cerebral arteries: Patent. Apparent decreased flow related signal within the left posterior cerebral circulation is probably due to fetal PCA origin and signal saturation. No proximal stenosis is identified. Basilar artery:  Patent.  Proximal fenestration. Vertebral arteries:  Patent. No evidence of high-grade stenosis, large vessel occlusion, or aneurysm unless noted above. IMPRESSION: 1. No occlusion, aneurysm, dissection, or significant stenosis is identified. Mild motion artifact of the MRA sequence. No gross interval change. 2. No evidence of acute infarct, intracranial hemorrhage, focal mass effect, or hydrocephalus. Stable mild chronic microvascular ischemic changes and parenchymal volume loss. 3. No structural abnormality identified. Electronically Signed   By: Mitzi Hansen M.D.   On: 04/19/2016 02:16   Dg Chest Port 1 View  Result Date: 04/18/2016 CLINICAL DATA:  65 year old male with central chest pain and shortness of breath for 3 days. Initial encounter. Former smoker. EXAM: PORTABLE CHEST 1 VIEW COMPARISON:  Chest radiographs 03/07/2014 and earlier. FINDINGS: Portable AP semi upright view at at 1306 hours. Mediastinal contours remain normal. Visualized tracheal air column is within normal limits. Mild increased interstitial markings have not significantly changed.  No pneumothorax, pulmonary edema, pleural effusion or confluent pulmonary opacity. Multiple EKG leads and wires overlie the chest. Negative visible bowel gas pattern. IMPRESSION: No acute cardiopulmonary abnormality. Electronically Signed   By: Odessa Fleming M.D.   On: 04/18/2016 13:20   Ct Angio Chest/abd/pel For Dissection W And/or W/wo  Result Date: 04/19/2016 CLINICAL DATA:  65 year old with midline chest pain. EXAM: CT ANGIOGRAPHY CHEST, ABDOMEN AND PELVIS TECHNIQUE: Multidetector CT imaging through the chest, abdomen and pelvis was performed using the standard protocol during bolus administration of intravenous contrast. Multiplanar reconstructed images and MIPs were obtained and reviewed to evaluate the vascular anatomy. CONTRAST:  100 mL Isovue 370 COMPARISON:  03/07/2014 FINDINGS: CTA CHEST FINDINGS Vascular structures: Coronary artery calcifications. Normal caliber of the thoracic aorta without intramural hematoma or dissection. Ascending thoracic aorta measures up to 3.4 cm. 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 The right subclavian artery extends posterior to the esophagus and there is no aneurysm at the origin. Pulmonary arteries are patent and no evidence for pulmonary embolism. Mediastinum: There is no significant chest lymphadenopathy. Again noted are surgical changes at the GE junction and proximal stomach. Mild fullness of the distal esophagus is  unchanged. Lungs/pleural: Trachea and mainstem bronchi are patent. There are some blebs at the lung apices, particularly on the right side. Lungs are essentially clear. There are densities in the lower lobes that probably represent atelectasis. No large pleural effusions. No significant airspace disease or consolidation in the lungs. Musculoskeletal: Degenerative changes in thoracic spine. No acute bone abnormality. Review of the MIP images confirms the above findings. CTA ABDOMEN AND PELVIS FINDINGS Vascular: Normal caliber of the abdominal aorta without aneurysm. Normal caliber of the internal iliac arteries without aneurysm. Atherosclerotic disease in the abdominal aorta. Variant celiac trunk anatomy. The common hepatic artery originates directly from the aorta. The left gastric artery may have been ligated from previous gastric surgery. There is a prominent right gastric artery. Splenic artery is patent. Mild narrowing at the origin of the SMA. This SMA narrowing could be related to external compression or noncalcified plaque. This is probably not hemodynamically significant stenosis. Inferior mesenteric artery is patent. Bilateral renal arteries are patent. Diffuse atherosclerotic disease in the iliac arteries. Severe stenosis at the origin of the right SFA. Stenosis at the origin of the left SFA. There is a stent in the proximal left SFA which has some intra stent stenosis. Profunda femoral arteries are patent bilaterally. Liver/biliary: Gallbladder is moderately distended with multiple gallstones. No gross abnormality to the liver. No inflammatory changes around the gallbladder. Pancreas: Normal appearance of the pancreas without inflammation or duct dilatation. Spleen: Normal appearance of spleen without enlargement. Adrenal/urinary tract: Normal adrenal glands. Normal appearance of both kidneys and bilateral adrenal glands. No hydronephrosis. Normal appearance of the urinary bladder with mild distention.  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 There is no significant bowel dilatation and no evidence for obstruction. Lymphatics:  No suspicious lymphadenopathy. Reproductive: No gross abnormality to the prostate or seminal vesicles. Other: No free fluid in the pelvis. No free air. Fat containing inguinal hernias. Musculoskeletal:  No acute abnormality. Review of the MIP images confirms the above findings. IMPRESSION: Negative for an aortic dissection or aneurysm. Cholelithiasis. Postsurgical changes in stomach. 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 Variant vascular anatomy. 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 Variant celiac trunk anatomy as described. 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 Electronically Signed   By: Richarda OverlieAdam  Henn M.D.   On: 04/19/2016 14:31      Lab Results  Component Value Date   HGBA1C 5.3 04/19/2016   HGBA1C 5.6 03/08/2014   HGBA1C 5.4 12/28/2013   Lab Results  Component Value Date   LDLCALC 97 04/18/2016   CREATININE 0.81 04/20/2016       Scheduled Meds: . regadenoson      . aspirin  324 mg Oral Daily  . clopidogrel  75 mg Oral Daily  . enoxaparin (LOVENOX) injection  40 mg Subcutaneous Q24H  . feeding supplement (ENSURE ENLIVE)  237 mL Oral BID BM  . levETIRAcetam  750 mg Oral BID  . lisinopril  5 mg Oral Daily  . LORazepam  1 mg Intravenous Once  . morphine  15 mg Oral BID  . pantoprazole  40 mg Oral Daily  . PARoxetine  20 mg Oral BH-q7a   Continuous Infusions:     LOS: 0 days    Time spent: >30 MINS    [facility] Psych[facility] HospitalBROL,Andree Heeg  [facility] Pager [phone]. If 7PM-7AM, please contact night-coverage at [email or web address], password [email or web address] 04/20/2016, 11:50 AM  LOS: 0 days

## 2016-04-20 NOTE — Progress Notes (Signed)
Nuclear stress test showed no ischemia and normal LVF - low risk.  NO further cardiac workup.

## 2016-04-20 NOTE — Progress Notes (Signed)
Subjective: No further spells   Exam: Vitals:   04/20/16 0748 04/20/16 0753  BP:  115/70  Pulse:    Resp:    Temp: 98.1 F (36.7 C)    Gen: In bed, NAD Resp: non-labored breathing, no acute distress Abd: soft, nt  Neuro: MS: awake, alert.  ZO:XWRUECN:PERRL, EOMI Motor: left arm movemetns with weakness, though unclear if actual weakness or limited by pain.   Impression:  Puzzling presentation with recurrent spells of unclear etiology and chest pain. Seizure is still very much a consideration, though I do not feel that it is definite. Unfortunately did not have any spell son EEG. I would favor stopping this at this time so he can have further evaluation with stress test and also , an MRI c-spine may be reasonable, as a radicular pain could cause the arm complaints, though would not cause the chest pain.   Recommendations: 1) MRI cervical spine 2) If further spells could increase keppra.  3) If spells continue, would consider ambulatory EEG monitoring as outpatient.   Ritta SlotMcNeill Jiaire Rosebrook, MD [facility] [phone]  If 7pm- 7am, please page neurology on call as listed in AMION.

## 2016-04-21 ENCOUNTER — Observation Stay (HOSPITAL_COMMUNITY): Payer: Medicare HMO

## 2016-04-21 DIAGNOSIS — R072 Precordial pain: Secondary | ICD-10-CM | POA: Diagnosis not present

## 2016-04-21 DIAGNOSIS — M79602 Pain in left arm: Secondary | ICD-10-CM | POA: Diagnosis not present

## 2016-04-21 DIAGNOSIS — F411 Generalized anxiety disorder: Secondary | ICD-10-CM | POA: Diagnosis not present

## 2016-04-21 DIAGNOSIS — R569 Unspecified convulsions: Secondary | ICD-10-CM | POA: Diagnosis not present

## 2016-04-21 DIAGNOSIS — R079 Chest pain, unspecified: Secondary | ICD-10-CM | POA: Diagnosis not present

## 2016-04-21 DIAGNOSIS — G894 Chronic pain syndrome: Secondary | ICD-10-CM | POA: Diagnosis not present

## 2016-04-21 MED ORDER — LEVETIRACETAM 750 MG PO TABS: 750.0000 mg | ORAL_TABLET | Freq: Two times a day (BID) | ORAL | 1 refills | Status: DC

## 2016-04-21 MED ORDER — ASPIRIN EC 81 MG PO TBEC: 81.0000 mg | DELAYED_RELEASE_TABLET | Freq: Every day | ORAL | 0 refills | Status: DC

## 2016-04-21 MED ORDER — ENSURE ENLIVE PO LIQD: 237.0000 mL | Freq: Two times a day (BID) | ORAL | 12 refills | Status: DC

## 2016-04-21 NOTE — Progress Notes (Signed)
Discharge teaching and instructions reviewed, VSS. Pt has no further questions at this time. Discharging home via wife.

## 2016-04-21 NOTE — Discharge Summary (Addendum)
Physician Discharge Summary  MARQUI FORMBY MRN: [id number] DOB/AGE: Jan 24, 1951 65 y.o.  PCP: Coy Saunas, MD   Admit date: 04/18/2016 Discharge date: 04/21/2016  Discharge Diagnoses:    Principal Problem:   Chest pain Active Problems:   Hypertension   Anxiety state   Chronic pain syndrome   Urgency of micturation   Seizure ([place])   Epigastric pain   Spells ([place])    Follow-up recommendations Follow-up with PCP in 3-5 days , including all  additional recommended appointments as below Follow-up CBC, CMP in 3-5 days Patient would benefit from being referred to neurology for his seizure disorder Patient would benefit from being referred to a pain clinic for management of his chronic narcotic dependence      Current Discharge Medication List    START taking these medications   Details  aspirin EC 81 MG tablet Take 1 tablet (81 mg total) by mouth daily. Qty: 30 tablet, Refills: 0    feeding supplement, ENSURE ENLIVE, (ENSURE ENLIVE) LIQD Take 237 mLs by mouth 2 (two) times daily between meals. Qty: 237 mL, Refills: 12      CONTINUE these medications which have CHANGED   Details  levETIRAcetam (KEPPRA) 750 MG tablet Take 1 tablet (750 mg total) by mouth 2 (two) times daily. Qty: 60 tablet, Refills: 1      CONTINUE these medications which have NOT CHANGED   Details  clonazePAM (KLONOPIN) 0.5 MG tablet Take 0.5 tablets (0.25 mg total) by mouth 2 (two) times daily. Qty: 60 tablet, Refills: 0    clopidogrel (PLAVIX) 75 MG tablet TAKE ONE TABLET BY MOUTH ONCE DAILY Qty: 30 tablet, Refills: 9    esomeprazole (NEXIUM) 40 MG capsule Take 40 mg by mouth at bedtime.     Homeopathic Products (LEG CRAMP RELIEF PO) Take 2-4 tablets by mouth as needed (for leg cramps).    HYDROmorphone (DILAUDID) 4 MG tablet Take 4 mg by mouth every 6 (six) hours as needed (for breakthrough pain).     lisinopril (PRINIVIL,ZESTRIL) 5 MG tablet Take 1 tablet (5 mg total) by mouth  daily. Qty: 30 tablet, Refills: 1    morphine (MS CONTIN) 15 MG 12 hr tablet Take 15 mg by mouth 2 (two) times daily.    nitroGLYCERIN (NITROSTAT) 0.4 MG SL tablet Place 0.4 mg under the tongue every 5 (five) minutes as needed for chest pain.    PARoxetine (PAXIL) 20 MG tablet Take 1 tablet (20 mg total) by mouth every morning. Qty: 30 tablet, Refills: 1    atorvastatin (LIPITOR) 80 MG tablet Take 1 tablet (80 mg total) by mouth daily at 6 (six) AM. Qty: 30 tablet, Refills: 0    cyclobenzaprine (FLEXERIL) 10 MG tablet Take 0.5 tablets (5 mg total) by mouth 2 (two) times daily as needed for muscle spasms. Qty: 60 tablet, Refills: 5         Discharge Condition: Stable    Discharge Instructions Get Medicines reviewed and adjusted: Please take all your medications with you for your next visit with your Primary MD  Please request your Primary MD to go over all hospital tests and procedure/radiological results at the follow up, please ask your Primary MD to get all Hospital records sent to his/her office.  If you experience worsening of your admission symptoms, develop shortness of breath, life threatening emergency, suicidal or homicidal thoughts you must seek medical attention immediately by calling 911 or calling your MD immediately if symptoms less severe.  You must read complete  instructions/literature along with all the possible adverse reactions/side effects for all the Medicines you take and that have been prescribed to you. Take any new Medicines after you have completely understood and accpet all the possible adverse reactions/side effects.   Do not drive when taking Pain medications.   Do not take more than prescribed Pain, Sleep and Anxiety Medications  Special Instructions: If you have smoked or chewed Tobacco in the last 2 yrs please stop smoking, stop any regular Alcohol and or any Recreational drug use.  Wear Seat belts while driving.  Please note  You were  cared for by a hospitalist during your hospital stay. Once you are discharged, your primary care physician will handle any further medical issues. Please note that NO REFILLS for any discharge medications will be authorized once you are discharged, as it is imperative that you return to your primary care physician (or establish a relationship with a primary care physician if you do not have one) for your aftercare needs so that they can reassess your need for medications and monitor your lab values.  Discharge Instructions    Diet - low sodium heart healthy    Complete by:  As directed   Increase activity slowly    Complete by:  As directed       No Known Allergies    Disposition: 01-Home or Self Care   Consults: Neurology Cardiology   Significant Diagnostic Studies:  Dg Abd 1 View  Result Date: 04/19/2016 CLINICAL DATA:  Acute onset of epigastric abdominal pain. Initial encounter. EXAM: ABDOMEN - 1 VIEW COMPARISON:  Chest and abdominal radiographs performed 09/02/2008 FINDINGS: The visualized bowel gas pattern is unremarkable. Scattered air and stool filled loops of colon are seen; no abnormal dilatation of small bowel loops is seen to suggest small bowel obstruction. No free intra-abdominal air is identified, though evaluation for free air is limited on a single supine view. The visualized osseous structures are within normal limits; the sacroiliac joints are unremarkable in appearance. IMPRESSION: Unremarkable bowel gas pattern; no free intra-abdominal air seen. Moderate amount of stool noted in the colon. Electronically Signed   By: Garald Balding M.D.   On: 04/19/2016 02:06   Mr Jodene Nam Head Wo Contrast  Result Date: 04/19/2016 CLINICAL DATA:  65 y/o M; PE recurrence. Type spells for several years with feeling of dizziness and confusion. Coal the concern for posterior circulation hypoperfusion versus seizures. EXAM: MRI HEAD WITHOUT CONTRAST MRA HEAD WITHOUT CONTRAST TECHNIQUE:  Multiplanar, multiecho pulse sequences of the brain and surrounding structures were obtained without intravenous contrast. Angiographic images of the head were obtained using MRA technique without contrast. COMPARISON:  MRI of the brain dated 04/13/2015. MRA of the head dated 12/27/2013. FINDINGS: MRI HEAD FINDINGS Brain: No diffusion restriction to suggest acute or early subacute infarct. No abnormal susceptibility hypointensity to indicate intracranial hemorrhage. Mild chronic microvascular ischemic changes are stable. No focal mass effect. No gross structural abnormality, disorder cortical formation, or gray matter heterotopia is identified. Hippocampi by are symmetric in size bilaterally. Extra-axial space: Normal ventricular size. No midline shift. No effacement of basilar cisterns. No extra-axial collection is identified. Proximal intracranial flow voids are maintained. No abnormality of the cervical medullary junction. Other: Mild diffuse paranasal sinus mucosal thickening. Pain partial opacification of posterior ethmoid air cells. No abnormal signal of the mastoid air cells. Orbits are unremarkable. Calvarium is unremarkable. MRA HEAD FINDINGS Internal carotid arteries:  Patent. Anterior cerebral arteries:  Patent. Middle cerebral arteries: Patent. Anterior communicating  artery: Patent. Posterior communicating arteries: Patent left posterior communicating artery. No right kidney Posterior cerebral arteries: Patent. Apparent decreased flow related signal within the left posterior cerebral circulation is probably due to fetal PCA origin and signal saturation. No proximal stenosis is identified. Basilar artery:  Patent.  Proximal fenestration. Vertebral arteries:  Patent. No evidence of high-grade stenosis, large vessel occlusion, or aneurysm unless noted above. IMPRESSION: 1. No occlusion, aneurysm, dissection, or significant stenosis is identified. Mild motion artifact of the MRA sequence. No gross interval  change. 2. No evidence of acute infarct, intracranial hemorrhage, focal mass effect, or hydrocephalus. Stable mild chronic microvascular ischemic changes and parenchymal volume loss. 3. No structural abnormality identified. Electronically Signed   By: Kristine Garbe M.D.   On: 04/19/2016 02:16   Mr Brain Wo Contrast  Result Date: 04/19/2016 CLINICAL DATA:  65 y/o M; PE recurrence. Type spells for several years with feeling of dizziness and confusion. Coal the concern for posterior circulation hypoperfusion versus seizures. EXAM: MRI HEAD WITHOUT CONTRAST MRA HEAD WITHOUT CONTRAST TECHNIQUE: Multiplanar, multiecho pulse sequences of the brain and surrounding structures were obtained without intravenous contrast. Angiographic images of the head were obtained using MRA technique without contrast. COMPARISON:  MRI of the brain dated 04/13/2015. MRA of the head dated 12/27/2013. FINDINGS: MRI HEAD FINDINGS Brain: No diffusion restriction to suggest acute or early subacute infarct. No abnormal susceptibility hypointensity to indicate intracranial hemorrhage. Mild chronic microvascular ischemic changes are stable. No focal mass effect. No gross structural abnormality, disorder cortical formation, or gray matter heterotopia is identified. Hippocampi by are symmetric in size bilaterally. Extra-axial space: Normal ventricular size. No midline shift. No effacement of basilar cisterns. No extra-axial collection is identified. Proximal intracranial flow voids are maintained. No abnormality of the cervical medullary junction. Other: Mild diffuse paranasal sinus mucosal thickening. Pain partial opacification of posterior ethmoid air cells. No abnormal signal of the mastoid air cells. Orbits are unremarkable. Calvarium is unremarkable. MRA HEAD FINDINGS Internal carotid arteries:  Patent. Anterior cerebral arteries:  Patent. Middle cerebral arteries: Patent. Anterior communicating artery: Patent. Posterior  communicating arteries: Patent left posterior communicating artery. No right kidney Posterior cerebral arteries: Patent. Apparent decreased flow related signal within the left posterior cerebral circulation is probably due to fetal PCA origin and signal saturation. No proximal stenosis is identified. Basilar artery:  Patent.  Proximal fenestration. Vertebral arteries:  Patent. No evidence of high-grade stenosis, large vessel occlusion, or aneurysm unless noted above. IMPRESSION: 1. No occlusion, aneurysm, dissection, or significant stenosis is identified. Mild motion artifact of the MRA sequence. No gross interval change. 2. No evidence of acute infarct, intracranial hemorrhage, focal mass effect, or hydrocephalus. Stable mild chronic microvascular ischemic changes and parenchymal volume loss. 3. No structural abnormality identified. Electronically Signed   By: Kristine Garbe M.D.   On: 04/19/2016 02:16   Mr Cervical Spine Wo Contrast  Result Date: 04/20/2016 CLINICAL DATA:  Recurrent spells of unclear etiology with chest pain and left arm pain. History of seizures. EXAM: MRI CERVICAL SPINE WITHOUT CONTRAST TECHNIQUE: Multiplanar, multisequence MR imaging of the cervical spine was performed. No intravenous contrast was administered. COMPARISON:  MRI and MRA brain 04/19/2016. Cervical spine MRI 04/26/2014. FINDINGS: Despite efforts by the technologist and patient, motion artifact is present on today's exam and could not be eliminated. This reduces exam sensitivity and specificity. Alignment: Stable and near anatomic. Vertebrae: No acute or suspicious osseous findings. Stable endplate degenerative changes at C4-5. Cord: Normal in signal and caliber. Posterior Fossa,  vertebral arteries, paraspinal tissues: Visualized portions of the posterior fossa and paraspinal soft tissues appear unremarkable. Bilateral vertebral artery flow voids. Disc levels: C2-3: Mild uncinate spurring. No significant spinal  stenosis or nerve root encroachment. C3-4: Stable asymmetric uncinate spurring on the left contributing to mild left foraminal narrowing. No cord deformity. C4-5: The CSF surrounding the cord is partially effaced, due to chronic spondylosis with endplate osteophytes and bilateral uncinate spurring. The AP diameter of the canal is approximately 10 mm. There is mild right and moderate left foraminal narrowing which appears chronic. There is possible left C5 nerve root encroachment. C5-6: Stable mild uncinate spurring and facet hypertrophy. No cord deformity or significant foraminal compromise. C6-7: Stable mild uncinate spurring and facet hypertrophy. No spinal stenosis or nerve root encroachment. C7-T1: No significant findings. IMPRESSION: 1. No acute findings, cord deformity or abnormal cord signal. 2. Generally stable multilevel spondylosis, greatest at C4-5. Endplate osteophytes and uncinate spurring at that level contribute to borderline spinal stenosis and left-greater-than-right foraminal narrowing. There is potential left C5 nerve root encroachment. Electronically Signed   By: Richardean Sale M.D.   On: 04/20/2016 15:28   Nm Myocar Multi W/spect W/wall Motion / Ef  Result Date: 04/20/2016 CLINICAL DATA:  Chest pain.  Ex-smoker.  Prior angioplasty EXAM: MYOCARDIAL IMAGING WITH SPECT (REST AND PHARMACOLOGIC-STRESS) GATED LEFT VENTRICULAR WALL MOTION STUDY LEFT VENTRICULAR EJECTION FRACTION TECHNIQUE: Standard myocardial SPECT imaging was performed after resting intravenous injection of 10 mCi Tc-69mtetrofosmin. Subsequently, intravenous infusion of Lexiscan was performed under the supervision of the Cardiology staff. At peak effect of the drug, 30 mCi Tc-922metrofosmin was injected intravenously and standard myocardial SPECT imaging was performed. Quantitative gated imaging was also performed to evaluate left ventricular wall motion, and estimate left ventricular ejection fraction. COMPARISON:  CTA of  04/19/2016. FINDINGS: Perfusion: No decreased activity in the left ventricle on stress imaging to suggest reversible ischemia or infarction. Wall Motion: Normal left ventricular wall motion. No left ventricular dilation. Left Ventricular Ejection Fraction: 62 % End diastolic volume 89 ml End systolic volume 34 ml IMPRESSION: 1. No reversible ischemia or infarction. 2. Normal left ventricular wall motion. 3. Left ventricular ejection fraction 62% 4. Non invasive risk stratification*: Low *2012 Appropriate Use Criteria for Coronary Revascularization Focused Update: J Am Coll Cardiol. 201610;96(0):[email or web address] Electronically Signed   By: KyAbigail Miyamoto.D.   On: 04/20/2016 15:21   Dg Chest Port 1 View  Result Date: 04/18/2016 CLINICAL DATA:  [age]ear old male with central chest pain and shortness of breath for 3 days. Initial encounter. Former smoker. EXAM: PORTABLE CHEST 1 VIEW COMPARISON:  Chest radiographs 03/07/2014 and earlier. FINDINGS: Portable AP semi upright view at at 1306 hours. Mediastinal contours remain normal. Visualized tracheal air column is within normal limits. Mild increased interstitial markings have not significantly changed. No pneumothorax, pulmonary edema, pleural effusion or confluent pulmonary opacity. Multiple EKG leads and wires overlie the chest. Negative visible bowel gas pattern. IMPRESSION: No acute cardiopulmonary abnormality. Electronically Signed   By: H Genevie Ann.D.   On: 04/18/2016 13:20   Ct Angio Chest/abd/pel For Dissection W And/or W/wo  Result Date: 04/19/2016 CLINICAL DATA:  89ear old with midline chest pain. EXAM: CT ANGIOGRAPHY CHEST, ABDOMEN AND PELVIS TECHNIQUE: Multidetector CT imaging through the chest, abdomen and pelvis was performed using the standard protocol during bolus administration of intravenous contrast. Multiplanar reconstructed images and MIPs were obtained and reviewed to evaluate the vascular  anatomy. CONTRAST:  100 mL Isovue 370 COMPARISON:  03/07/2014  FINDINGS: CTA CHEST FINDINGS Vascular structures: Coronary artery calcifications. Normal caliber of the thoracic aorta without intramural hematoma or dissection. Ascending thoracic aorta measures up to 3.4 cm. 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 The right subclavian artery extends posterior to the esophagus and there is no aneurysm at the origin. Pulmonary arteries are patent and no evidence for pulmonary embolism. Mediastinum: There is no significant chest lymphadenopathy. Again noted are surgical changes at the GE junction and proximal stomach. Mild fullness of the distal esophagus is unchanged. Lungs/pleural: Trachea and mainstem bronchi are patent. There are some blebs at the lung apices, particularly on the right side. Lungs are essentially clear. There are densities in the lower lobes that probably represent atelectasis. No large pleural effusions. No significant airspace disease or consolidation in the lungs. Musculoskeletal: Degenerative changes in thoracic spine. No acute bone abnormality. Review of the MIP images confirms the above findings. CTA ABDOMEN AND PELVIS FINDINGS Vascular: Normal caliber of the abdominal aorta without aneurysm. Normal caliber of the internal iliac arteries without aneurysm. Atherosclerotic disease in the abdominal aorta. Variant celiac trunk anatomy. The common hepatic artery originates directly from the aorta. The left gastric artery may have been ligated from previous gastric surgery. There is a prominent right gastric artery. Splenic artery is patent. Mild narrowing at the origin of the SMA. This SMA narrowing could be related to external compression or noncalcified plaque. This is probably not hemodynamically significant stenosis. Inferior mesenteric artery is patent. Bilateral renal arteries are patent. Diffuse atherosclerotic disease in the iliac arteries. Severe stenosis at  the origin of the right SFA. Stenosis at the origin of the left SFA. There is a stent in the proximal left SFA which has some intra stent stenosis. Profunda femoral arteries are patent bilaterally. Liver/biliary: Gallbladder is moderately distended with multiple gallstones. No gross abnormality to the liver. No inflammatory changes around the gallbladder. Pancreas: Normal appearance of the pancreas without inflammation or duct dilatation. Spleen: Normal appearance of spleen without enlargement. Adrenal/urinary tract: Normal adrenal glands. Normal appearance of both kidneys and bilateral adrenal glands. No hydronephrosis. Normal appearance of the urinary bladder with mild distention. 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 There is no significant bowel dilatation and no evidence for obstruction. Lymphatics:  No suspicious lymphadenopathy. Reproductive: No gross abnormality to the prostate or seminal vesicles. Other: No free fluid in the pelvis. No free air. Fat containing inguinal hernias. Musculoskeletal:  No acute abnormality. Review of the MIP images confirms the above findings. IMPRESSION: Negative for an aortic dissection or aneurysm. Cholelithiasis. Postsurgical changes in stomach. 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 Variant vascular anatomy. 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 Variant celiac trunk anatomy as described. Atherosclerotic disease with stenosis in the bilateral superficial femoral arteries as described. Electronically Signed   By: Markus Daft M.D.   On: 04/19/2016 14:31   US Abdomen Limited Ruq  Result Date: 04/21/2016 CLINICAL DATA:  Cholelithiasis. Right upper quadrant pain for 2 days. EXAM: US ABDOMEN LIMITED - RIGHT UPPER QUADRANT COMPARISON:  Plain films of 04/19/2016 and CT of 03/07/2014. FINDINGS: Gallbladder: Multiple gallstones including at up to 1.6 cm. No wall thickening or  pericholecystic fluid. Sonographic Murphy's sign was not elicited. Common bile duct: Diameter: Normal, 6 mm. Liver: Increased hepatic echogenicity is consistent with steatosis. Areas of relative hypo echogenicity including adjacent the gallbladder which are likely related to is sparing. Some areas, including adjacent the  portal vein on image 16, are somewhat masslike. IMPRESSION: 1. Cholelithiasis without acute cholecystitis or biliary duct dilatation. 2. Hepatic steatosis. 3. Probable sparing of steatosis throughout the liver. As some of these areas are relatively masslike, consider follow-up with right upper quadrant ultrasound in 1-2 months to confirm stability. A more aggressive approach, especially if the patient has a history of liver disease or primary malignancy, includes pre and post contrast abdominal MRI. This should be performed when the patient is able to breath hold and follow directions (as an outpatient). Electronically Signed   By: Abigail Miyamoto M.D.   On: 04/21/2016 09:24        Filed Weights   04/18/16 1832 04/19/16 0417 04/21/16 0500  Weight: 88.4 kg (194 lb 12.8 oz) 85.8 kg (189 lb 1.6 oz) 86.4 kg (190 lb 6.4 oz)     Microbiology: No results found for this or any previous visit (from the past 240 hour(s)).     Blood Culture No results found for: SDES, [place], CULT, REPTSTATUS    Labs: Results for orders placed or performed during the hospital encounter of 04/18/16 (from the past 48 hour(s))  T4, free     Status: None   Collection Time: 04/19/16  2:46 PM  Result Value Ref Range   Free T4 0.83 0.61 - 1.12 ng/dL    Comment: (NOTE) Biotin ingestion may interfere with free T4 tests. If the results are inconsistent with the TSH level, previous test results, or the clinical presentation, then consider biotin interference. If needed, order repeat testing after stopping biotin.   CBC     Status: None   Collection Time: 04/20/16  4:06 AM  Result Value Ref Range    WBC 6.9 4.0 - 10.5 K/uL   RBC 4.82 4.22 - 5.81 MIL/uL   Hemoglobin 14.1 13.0 - 17.0 g/dL   HCT 41.9 39.0 - 52.0 %   MCV 86.9 78.0 - 100.0 fL   MCH 29.3 26.0 - 34.0 pg   MCHC 33.7 30.0 - 36.0 g/dL   RDW 13.3 11.5 - 15.5 %   Platelets 191 150 - 400 K/uL  Comprehensive metabolic panel     Status: Abnormal   Collection Time: 04/20/16  4:06 AM  Result Value Ref Range   Sodium 138 135 - 145 mmol/L   Potassium 3.7 3.5 - 5.1 mmol/L   Chloride 105 101 - 111 mmol/L   CO2 26 22 - 32 mmol/L   Glucose, Bld 109 (H) 65 - 99 mg/dL   BUN 9 6 - 20 mg/dL   Creatinine, Ser 0.81 0.61 - 1.24 mg/dL   Calcium 8.8 (L) 8.9 - 10.3 mg/dL   Total Protein 6.2 (L) 6.5 - 8.1 g/dL   Albumin 3.6 3.5 - 5.0 g/dL   AST 18 15 - 41 U/L   ALT 17 17 - 63 U/L   Alkaline Phosphatase 65 38 - 126 U/L   Total Bilirubin 0.7 0.3 - 1.2 mg/dL   GFR calc non Af Amer >60 >60 mL/min   GFR calc Af Amer >60 >60 mL/min    Comment: (NOTE) The eGFR has been calculated using the CKD EPI equation. This calculation has not been validated in all clinical situations. eGFR's persistently <60 mL/min signify possible Chronic Kidney Disease.    Anion gap 7 5 - 15  Urine rapid drug screen (hosp performed)     Status: Abnormal   Collection Time: 04/20/16  1:58 PM  Result Value Ref Range   Opiates POSITIVE (A)  NONE DETECTED   Cocaine NONE DETECTED NONE DETECTED   Benzodiazepines NONE DETECTED NONE DETECTED   Amphetamines NONE DETECTED NONE DETECTED   Tetrahydrocannabinol NONE DETECTED NONE DETECTED   Barbiturates NONE DETECTED NONE DETECTED    Comment:        DRUG SCREEN FOR MEDICAL PURPOSES ONLY.  IF CONFIRMATION IS NEEDED FOR ANY PURPOSE, NOTIFY LAB WITHIN 5 DAYS.        LOWEST DETECTABLE LIMITS FOR URINE DRUG SCREEN Drug Class       Cutoff (ng/mL) Amphetamine      1000 Barbiturate      200 Benzodiazepine   329 Tricyclics       924 Opiates          300 Cocaine          300 THC              50      Lipid Panel      Component Value Date/Time   CHOL 157 04/18/2016 1919   TRIG 106 04/18/2016 1919   HDL 39 (L) 04/18/2016 1919   CHOLHDL 4.0 04/18/2016 1919   VLDL 21 04/18/2016 1919   LDLCALC 97 04/18/2016 1919     Lab Results  Component Value Date   HGBA1C 5.3 04/19/2016   HGBA1C 5.6 03/08/2014   HGBA1C 5.4 12/28/2013     Lab Results  Component Value Date   LDLCALC 97 04/18/2016   CREATININE 0.81 04/20/2016    Cristal Deer Warrenis a 65 y.o.malewith past medical history significant for coronary artery disease, stroke, hypertension, heart attack, coronary artery stents placed in 2009, presented to the emergency room with a chief complaint of recurrent seizures. Patient typically does not have daily Seizures but Has Had 1 a Day since Sunday. Patient Is Also Had an Increase in the Severity of His Seizures. Patient Had One Today Lasting over a Minute and Then This Seizure Became Very Diaphoretic. He has also reported having chest discomfort. Describes it as something sitting on his chest. Has been worse with activity over the past 3 weeks and associated with dyspnea. Pain can also occur at rest. Today, he reports having 4-5/10 chest pain on exam while on IV NTG at 16mg/min. Says his IV morphine helps with the pain. His chest is diffusely tender to palpation. Neurology and cardiology consulted   Hospital course Chest Pain with Mixed Typical and Atypical Features/ CAD - known CAD s/p DES to 2nd Mrg and LCx in 2009 with CVA occurring following the procedure, repeat cath in 03/2014 showing patent stentsin LCx. chest pain on exam while on IV NTG at 241m/min. Says his IV morphine helps with the pain.  - chest is diffusely tender to palpation, unsure if it resembles prior symptoms in 2009. - cyclic troponin values have been negative. EKG shows sinus bradycardia, HR 56, with no acute ST or T-wave changes. Echocardiogram shows an EF of 55-60% with Grade 1 DD and no wall motion abnormalities. -  With cath in  2015 showing patent stents and with him having a CVA in the setting of a cath in 2009, would not pursue any further workup as per cardiology,   Patient had CT chest abdomen pelvis yesterday which was negative for aortic dissection or aneurysm, cardiology has arranged for a nuclear stress test which was also low risk    focal seizures as opposed VS  TIA Patient has history of absent seizures with increased frequency recently Dr. 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 Would recommend that PCP consider titrating some of these medications down  Hypertension Lisinopril 5 mg daily  Hyperlipidemia Triglycerides 106, HDL 39, hemoglobin A1c 5.3  Chronic pain Continue home MS Contin & dilaudid    Epigastric tenderness Nexium->Protonix40 mg by mouth daily Cholelithiasis on CT abdomen, right upper quadrant ultrasound to rule out cholecystitis   Anxiety Klonopin 0.5 mg by mouth twice a day when necessary Paxil 20 mg every morning   Hypoglycemia-hemoglobin A1c 5.3     Discharge Exam:  Blood pressure 130/71, pulse 63, temperature 98.3 F (36.8 C), temperature source Oral, resp. rate 15, height '5\' 10"'  (1.778 m), weight 86.4 kg (190 lb 6.4 oz), SpO2 98 %.   General exam: Appears calm and comfortable  Respiratory system: Clear to auscultation.  Respiratory effort normal. Cardiovascular system: S1 & S2 heard, RRR. No JVD, murmurs, rubs, gallops or clicks. No pedal edema. Gastrointestinal system: Abdomen is nondistended, soft and nontender. No organomegaly or masses felt. Normal bowel sounds heard. Central nervous system: Alert and oriented. No focal neurological deficits. Extremities: Symmetric 5 x 5 power. Skin: No rashes, lesions or ulcers Psychiatry: Judgement and insight appear normal. Mood & affect appropriate.     Follow-up Information    DAVIS,[name], MD. Schedule an appointment as soon as possible for a visit today.   Specialty:  Family Medicine Why:  Please call PCP to set up this appointment Contact information: [address] [place] [address] [phone]           Signed: Reyne Dumas 04/21/2016, 12:16 PM        Time spent >45 mins

## 2016-04-21 NOTE — Progress Notes (Signed)
Subjective:  No further spells.   Exam: Vitals:   04/21/16 0500 04/21/16 0732  BP: 109/73 130/71  Pulse: 63   Resp: 16   Temp: 98.3 F (36.8 C)    Gen: In bed, NAD Resp: non-labored breathing, no acute distress Abd: soft, nt  Neuro: MS: awake, alert.  CN: face symmetric Motor: He has pain with passive  Anterior movement of the left shoulder.   Impression:  Puzzling presentation with recurrent spells of unclear etiology and chest pain. Seizure is still very much a consideration, though I do not feel that it is definite. Unfortunately did not have any spells on EEG. He has not had any spells for the past 2 days.   If he were to have further spells, I would perform ambulatory EEG to further characterize.   He may have some contribution to his pain from C5 impingement and would try gabapentin to see if it helps his pain. Also could be musculoskeletal and if he continues having problems, may need orthopedic evaluation(e.g. Rotator cuff).   1) Gabapentin 300mg  TID. Keppra 750mg  BID 2) follow up as outpatient with neurology.  3) neurology to sign off at this time, please call with further questions.   Ritta SlotMcNeill Marilla Boddy, MD [facility] [phone]  If 7pm- 7am, please page neurology on call as listed in AMION.

## 2016-05-05 ENCOUNTER — Emergency Department (HOSPITAL_COMMUNITY): Payer: Medicare HMO

## 2016-05-05 ENCOUNTER — Encounter (HOSPITAL_COMMUNITY): Payer: Self-pay

## 2016-05-05 ENCOUNTER — Inpatient Hospital Stay (HOSPITAL_COMMUNITY)
Admission: EM | Admit: 2016-05-05 | Discharge: 2016-05-09 | DRG: 419 | Disposition: A | Discharge status: Home / Self Care | Payer: Medicare HMO | Attending: Internal Medicine | Admitting: Internal Medicine

## 2016-05-05 DIAGNOSIS — Z955 Presence of coronary angioplasty implant and graft: Secondary | ICD-10-CM | POA: Diagnosis not present

## 2016-05-05 DIAGNOSIS — Z79899 Other long term (current) drug therapy: Secondary | ICD-10-CM

## 2016-05-05 DIAGNOSIS — Z8673 Personal history of transient ischemic attack (TIA), and cerebral infarction without residual deficits: Secondary | ICD-10-CM

## 2016-05-05 DIAGNOSIS — E785 Hyperlipidemia, unspecified: Secondary | ICD-10-CM | POA: Diagnosis present

## 2016-05-05 DIAGNOSIS — I739 Peripheral vascular disease, unspecified: Secondary | ICD-10-CM | POA: Diagnosis present

## 2016-05-05 DIAGNOSIS — K8000 Calculus of gallbladder with acute cholecystitis without obstruction: Principal | ICD-10-CM | POA: Diagnosis present

## 2016-05-05 DIAGNOSIS — Z8249 Family history of ischemic heart disease and other diseases of the circulatory system: Secondary | ICD-10-CM

## 2016-05-05 DIAGNOSIS — G40909 Epilepsy, unspecified, not intractable, without status epilepticus: Secondary | ICD-10-CM | POA: Diagnosis present

## 2016-05-05 DIAGNOSIS — Z87891 Personal history of nicotine dependence: Secondary | ICD-10-CM | POA: Diagnosis not present

## 2016-05-05 DIAGNOSIS — Z7982 Long term (current) use of aspirin: Secondary | ICD-10-CM

## 2016-05-05 DIAGNOSIS — K219 Gastro-esophageal reflux disease without esophagitis: Secondary | ICD-10-CM | POA: Diagnosis present

## 2016-05-05 DIAGNOSIS — K819 Cholecystitis, unspecified: Secondary | ICD-10-CM

## 2016-05-05 DIAGNOSIS — I10 Essential (primary) hypertension: Secondary | ICD-10-CM | POA: Diagnosis present

## 2016-05-05 DIAGNOSIS — Z7902 Long term (current) use of antithrombotics/antiplatelets: Secondary | ICD-10-CM

## 2016-05-05 DIAGNOSIS — K8020 Calculus of gallbladder without cholecystitis without obstruction: Secondary | ICD-10-CM | POA: Diagnosis not present

## 2016-05-05 DIAGNOSIS — G894 Chronic pain syndrome: Secondary | ICD-10-CM | POA: Diagnosis present

## 2016-05-05 DIAGNOSIS — F319 Bipolar disorder, unspecified: Secondary | ICD-10-CM | POA: Diagnosis present

## 2016-05-05 DIAGNOSIS — I2510 Atherosclerotic heart disease of native coronary artery without angina pectoris: Secondary | ICD-10-CM | POA: Diagnosis present

## 2016-05-05 DIAGNOSIS — K2270 Barrett's esophagus without dysplasia: Secondary | ICD-10-CM | POA: Diagnosis present

## 2016-05-05 DIAGNOSIS — G40109 Localization-related (focal) (partial) symptomatic epilepsy and epileptic syndromes with simple partial seizures, not intractable, without status epilepticus: Secondary | ICD-10-CM

## 2016-05-05 DIAGNOSIS — R1011 Right upper quadrant pain: Secondary | ICD-10-CM | POA: Diagnosis present

## 2016-05-05 LAB — CBC
HEMATOCRIT: 42.1 % (ref 39.0–52.0)
Hemoglobin: 14.1 g/dL (ref 13.0–17.0)
MCH: 29.1 pg (ref 26.0–34.0)
MCHC: 33.5 g/dL (ref 30.0–36.0)
MCV: 87 fL (ref 78.0–100.0)
PLATELETS: 199 10*3/uL (ref 150–400)
RBC: 4.84 MIL/uL (ref 4.22–5.81)
RDW: 13.4 % (ref 11.5–15.5)
WBC: 7.1 10*3/uL (ref 4.0–10.5)

## 2016-05-05 LAB — HEPATIC FUNCTION PANEL
ALT: 19 U/L (ref 17–63)
AST: 21 U/L (ref 15–41)
Albumin: 4 g/dL (ref 3.5–5.0)
Alkaline Phosphatase: 74 U/L (ref 38–126)
BILIRUBIN TOTAL: 0.6 mg/dL (ref 0.3–1.2)
Total Protein: 6.8 g/dL (ref 6.5–8.1)

## 2016-05-05 LAB — LIPASE, BLOOD: LIPASE: 38 U/L (ref 11–51)

## 2016-05-05 LAB — BASIC METABOLIC PANEL
Anion gap: 7 (ref 5–15)
CO2: 27 mmol/L (ref 22–32)
CREATININE: 0.78 mg/dL (ref 0.61–1.24)
Calcium: 9.1 mg/dL (ref 8.9–10.3)
Chloride: 100 mmol/L — ABNORMAL LOW (ref 101–111)
Glucose, Bld: 104 mg/dL — ABNORMAL HIGH (ref 65–99)
POTASSIUM: 3.8 mmol/L (ref 3.5–5.1)
SODIUM: 134 mmol/L — AB (ref 135–145)

## 2016-05-05 LAB — URINALYSIS, ROUTINE W REFLEX MICROSCOPIC
Bilirubin Urine: NEGATIVE
Glucose, UA: NEGATIVE mg/dL
Hgb urine dipstick: NEGATIVE
Ketones, ur: NEGATIVE mg/dL
Leukocytes, UA: NEGATIVE
Nitrite: NEGATIVE
Protein, ur: NEGATIVE mg/dL
Specific Gravity, Urine: 1.009 (ref 1.005–1.030)
pH: 7 (ref 5.0–8.0)

## 2016-05-05 LAB — I-STAT TROPONIN, ED
Troponin i, poc: 0 ng/mL (ref 0.00–0.08)
Troponin i, poc: 0 ng/mL (ref 0.00–0.08)

## 2016-05-05 LAB — BRAIN NATRIURETIC PEPTIDE: B Natriuretic Peptide: 38.2 pg/mL (ref 0.0–100.0)

## 2016-05-05 LAB — D-DIMER, QUANTITATIVE (NOT AT ARMC): D-Dimer, Quant: 0.33 ug/mL-FEU (ref 0.00–0.50)

## 2016-05-05 MED ORDER — SODIUM CHLORIDE 0.9 % IV BOLUS (SEPSIS)
1000.0000 mL | Freq: Once | INTRAVENOUS | Status: AC
  Administered 2016-05-05: 1000 mL via INTRAVENOUS

## 2016-05-05 MED ORDER — SODIUM CHLORIDE 0.9% FLUSH
3.0000 mL | Freq: Two times a day (BID) | INTRAVENOUS | Status: DC
  Administered 2016-05-05 – 2016-05-09 (×6): 3 mL via INTRAVENOUS

## 2016-05-05 MED ORDER — MORPHINE SULFATE (PF) 4 MG/ML IV SOLN
4.0000 mg | INTRAVENOUS | Status: DC | PRN
  Administered 2016-05-05 – 2016-05-06 (×4): 4 mg via INTRAVENOUS
  Filled 2016-05-05 (×4): qty 1

## 2016-05-05 MED ORDER — ACETAMINOPHEN 650 MG RE SUPP: 650.0000 mg | Freq: Four times a day (QID) | RECTAL | Status: DC | PRN

## 2016-05-05 MED ORDER — ASPIRIN EC 81 MG PO TBEC
81.0000 mg | DELAYED_RELEASE_TABLET | Freq: Every day | ORAL | Status: DC
  Administered 2016-05-05 – 2016-05-09 (×4): 81 mg via ORAL
  Filled 2016-05-05 (×5): qty 1

## 2016-05-05 MED ORDER — SODIUM CHLORIDE 0.9 % IV SOLN
250.0000 mL | INTRAVENOUS | Status: DC | PRN
  Administered 2016-05-06: 250 mL via INTRAVENOUS

## 2016-05-05 MED ORDER — ACETAMINOPHEN 325 MG PO TABS
650.0000 mg | ORAL_TABLET | Freq: Four times a day (QID) | ORAL | Status: DC | PRN
  Administered 2016-05-05: 650 mg via ORAL
  Filled 2016-05-05: qty 2

## 2016-05-05 MED ORDER — SODIUM CHLORIDE 0.9% FLUSH: 3.0000 mL | INTRAVENOUS | Status: DC | PRN

## 2016-05-05 MED ORDER — ONDANSETRON HCL 4 MG/2ML IJ SOLN
4.0000 mg | Freq: Once | INTRAMUSCULAR | Status: AC
  Administered 2016-05-05: 4 mg via INTRAVENOUS
  Filled 2016-05-05: qty 2

## 2016-05-05 MED ORDER — ATORVASTATIN CALCIUM 80 MG PO TABS
80.0000 mg | ORAL_TABLET | Freq: Every day | ORAL | Status: DC
Start: 2016-05-06 — End: 2016-05-05

## 2016-05-05 MED ORDER — ALBUTEROL SULFATE (2.5 MG/3ML) 0.083% IN NEBU: 2.5000 mg | INHALATION_SOLUTION | RESPIRATORY_TRACT | Status: DC | PRN

## 2016-05-05 MED ORDER — SENNA 8.6 MG PO TABS
1.0000 | ORAL_TABLET | Freq: Two times a day (BID) | ORAL | Status: DC
  Administered 2016-05-05 – 2016-05-09 (×7): 8.6 mg via ORAL
  Filled 2016-05-05 (×7): qty 1

## 2016-05-05 MED ORDER — OXYCODONE HCL 5 MG PO TABS
5.0000 mg | ORAL_TABLET | ORAL | Status: DC | PRN
  Administered 2016-05-06 – 2016-05-09 (×13): 5 mg via ORAL
  Filled 2016-05-05 (×12): qty 1

## 2016-05-05 MED ORDER — MORPHINE SULFATE (PF) 4 MG/ML IV SOLN
4.0000 mg | Freq: Once | INTRAVENOUS | Status: AC
  Administered 2016-05-05: 4 mg via INTRAVENOUS
  Filled 2016-05-05: qty 1

## 2016-05-05 MED ORDER — PANTOPRAZOLE SODIUM 40 MG PO TBEC
40.0000 mg | DELAYED_RELEASE_TABLET | Freq: Every day | ORAL | Status: DC
  Administered 2016-05-05 – 2016-05-09 (×5): 40 mg via ORAL
  Filled 2016-05-05 (×5): qty 1

## 2016-05-05 MED ORDER — MORPHINE SULFATE ER 15 MG PO TBCR
15.0000 mg | EXTENDED_RELEASE_TABLET | Freq: Two times a day (BID) | ORAL | Status: DC
  Administered 2016-05-05 – 2016-05-09 (×8): 15 mg via ORAL
  Filled 2016-05-05 (×8): qty 1

## 2016-05-05 MED ORDER — FENTANYL CITRATE (PF) 100 MCG/2ML IJ SOLN: 50.0000 ug | INTRAMUSCULAR | Status: DC | PRN

## 2016-05-05 MED ORDER — CLONAZEPAM 0.5 MG PO TABS: 0.2500 mg | ORAL_TABLET | Freq: Two times a day (BID) | ORAL | Status: DC

## 2016-05-05 MED ORDER — ENSURE ENLIVE PO LIQD: 237.0000 mL | Freq: Two times a day (BID) | ORAL | Status: DC

## 2016-05-05 MED ORDER — ASPIRIN EC 81 MG PO TBEC: 81.0000 mg | DELAYED_RELEASE_TABLET | Freq: Every day | ORAL | Status: DC

## 2016-05-05 MED ORDER — LISINOPRIL 5 MG PO TABS
5.0000 mg | ORAL_TABLET | Freq: Every day | ORAL | Status: DC
  Administered 2016-05-06 – 2016-05-09 (×4): 5 mg via ORAL
  Filled 2016-05-05 (×4): qty 1

## 2016-05-05 MED ORDER — NITROGLYCERIN 0.4 MG SL SUBL: 0.4000 mg | SUBLINGUAL_TABLET | SUBLINGUAL | Status: DC | PRN

## 2016-05-05 MED ORDER — ONDANSETRON HCL 4 MG PO TABS: 4.0000 mg | ORAL_TABLET | Freq: Four times a day (QID) | ORAL | Status: DC | PRN

## 2016-05-05 MED ORDER — CYCLOBENZAPRINE HCL 5 MG PO TABS: 5.0000 mg | ORAL_TABLET | Freq: Two times a day (BID) | ORAL | Status: DC | PRN

## 2016-05-05 MED ORDER — TRAZODONE HCL 50 MG PO TABS
50.0000 mg | ORAL_TABLET | Freq: Every evening | ORAL | Status: DC | PRN
  Administered 2016-05-06 – 2016-05-09 (×3): 50 mg via ORAL
  Filled 2016-05-05 (×3): qty 1

## 2016-05-05 MED ORDER — LORAZEPAM 0.5 MG PO TABS: 0.5000 mg | ORAL_TABLET | Freq: Three times a day (TID) | ORAL | Status: DC | PRN

## 2016-05-05 MED ORDER — ONDANSETRON HCL 4 MG/2ML IJ SOLN
4.0000 mg | Freq: Four times a day (QID) | INTRAMUSCULAR | Status: DC | PRN
  Administered 2016-05-06 – 2016-05-07 (×2): 4 mg via INTRAVENOUS
  Filled 2016-05-05 (×2): qty 2

## 2016-05-05 MED ORDER — BISACODYL 10 MG RE SUPP: 10.0000 mg | Freq: Every day | RECTAL | Status: DC | PRN

## 2016-05-05 MED ORDER — LEVETIRACETAM 750 MG PO TABS
750.0000 mg | ORAL_TABLET | Freq: Two times a day (BID) | ORAL | Status: DC
  Administered 2016-05-05 – 2016-05-09 (×8): 750 mg via ORAL
  Filled 2016-05-05 (×8): qty 1

## 2016-05-05 MED ORDER — POLYETHYLENE GLYCOL 3350 17 G PO PACK: 17.0000 g | PACK | Freq: Every day | ORAL | Status: DC | PRN

## 2016-05-05 MED ORDER — PAROXETINE HCL 20 MG PO TABS
20.0000 mg | ORAL_TABLET | Freq: Every day | ORAL | Status: DC
  Administered 2016-05-07 – 2016-05-09 (×3): 20 mg via ORAL
  Filled 2016-05-05 (×4): qty 1

## 2016-05-05 NOTE — ED Triage Notes (Signed)
Patient complains of chest pain intermittently since being discharged from hospital on the 20th. Nausea and vomiting with same. Plans to see surgeon for gallbladder disease, slightly pale on arrival. Alert and oriented

## 2016-05-05 NOTE — ED Notes (Signed)
Report given to RN Ben.

## 2016-05-05 NOTE — ED Notes (Signed)
Patient transported to Ultrasound 

## 2016-05-05 NOTE — ED Notes (Signed)
Attempted report.  Left # to call back #[phone].

## 2016-05-05 NOTE — H&P (Signed)
Patient Demographics:    Justin Wells, is a 65 y.o. male  MRN: [id number]   DOB - Aug 30, 1951  Admit Date - 05/05/2016  Outpatient Primary MD for the patient is Carmin Richmond, MD   Assessment & Plan:    Active Problems:   Cholelithiasis    1)Cholelithiasis- persistent nausea vomiting and right upper quadrant pain, no fevers, no chills, no rigors, no jaundice, no evidence for ascending cholangitis. Patient appears to have failed outpatient management. ED provider discussed case with on-call surgeon Dr. Magnus Ivan who will see patient. Treat empirically with when necessary opiates, antiemetics, hydrate, liquid diet for now. Gallbladder ultrasound from 04/21/2016 and repeat gallbladder ultrasound from 05/05/2016 both show cholelithiasis without significant acute cholecystitis, however patient is very symptomatic. Ultrasound demonstrates fatty infiltration of the liver, fasting lipid profile pending  2)Preop- history of CAD last coronary intervention about 10 years ago, recent nuclear stress test and echocardiogram from August 2017 without significant abnormalities, no reversible ischemia and EF was preserved. Okay to Hold Plavix to allow for possible laparoscopic cholecystectomy. Continue aspirin, patient without frank chest pain and no  ACS type symptoms. Patient previously had Nissen's fundoplication without postoperative or anesthesia related or bleeding complications  3)H/o CAD and H/o TIA- no prior stroke, continue aspirin and Lipitor, Plavix on hold as above in #2. Recent CT head, MRI and MRA of the brain from August 2017 without acute intracranial abnormalities. Continue Lisinopril   With History of - Reviewed by me  Past Medical History:  Diagnosis Date  . Anxiety   . Arthritis    "hands; back; legs"  (04/18/2016)  . Barrett's esophagus   . Cerebrovascular disease   . CHF (congestive heart failure) (HCC)    "said I had this a few years ago; it went away" (04/18/2016)  . Chronic back pain   . Coronary artery disease   . Depression   . Gastroesophageal reflux   . H/O hiatal hernia   . NWGNFAOZ(308.6)    "probably monthly" (01/27/2013)  . Hyperlipidemia   . Hypertension   . Peripheral arterial disease (HCC)   . Seizures (HCC)    "daily the last 5 days" (04/18/2016)  . 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 ABDOMINAL AORTAGRAM N/A 01/26/2013   Procedure: ABDOMINAL Ronny Flurry;  Surgeon: Nada Libman, MD;  Location: [facility] CATH LAB;  Service: Cardiovascular;  Laterality: N/A;  . CARDIAC CATHETERIZATION    . COLONOSCOPY    . CORONARY ANGIOPLASTY WITH STENT PLACEMENT  11/02/2007   "2" (01/27/2013)  . DIAGNOSTIC LAPAROSCOPY     "twice after nissen; it kept coming apart " (01/27/2013)  . ESOPHAGOGASTRODUODENOSCOPY    . FEMORAL ARTERY STENT Bilateral 01/26/2013  . HERNIA REPAIR    .  LAPAROSCOPIC GASTRIC BANDING  2010  . LAPAROSCOPIC NISSEN FUNDOPLICATION  ?2001  . LEFT HEART CATHETERIZATION WITH CORONARY ANGIOGRAM N/A 03/09/2014   Procedure: LEFT HEART CATHETERIZATION WITH CORONARY ANGIOGRAM;  Surgeon: Iran OuchMuhammad A Arida, MD;  Location: [facility] CATH LAB;  Service: Cardiovascular;  Laterality: N/A;  . LOWER EXTREMITY ANGIOGRAM Left 03/02/2013   Procedure: LOWER EXTREMITY ANGIOGRAM;  Surgeon: Nada LibmanVance W Brabham, MD;  [facility];  Service: Cardiovascular;  Laterality: Left;      Chief Complaint  Patient presents with  . Chest Pain      HPI:    Justin Wells  is a 65 y.o. male, With past medical history relevant for history of CAD, history of TIA without frank stroke, history of seizures and cholelithiasis who  presents with intractable nausea vomiting and abdominal pain. No fevers no chills, no jaundice. Emesis is without blood, no diarrhea. The abdominal pain is mostly right upper quadrant and epigastric area radiates to the back. No CVA area tenderness. No sick contacts at home, unable to keep down any oral intake lately. Additional history obtained from patient's wife at bedside    Review of systems:    In addition to the HPI above,   A full 12 point Review of Systems was done, except as stated above, all other Review of Systems were negative.    Social History:  Reviewed by me    Social History  Substance Use Topics  . Smoking status: Former Smoker    Packs/day: 2.00    Years: 35.00    Types: Cigarettes    Quit date: 01/29/2000  . Smokeless tobacco: Never Used  . Alcohol use No     Comment: 04/18/2016  "quit drinking in 07/1999; I'm a recovering alcoholic"       Family History :  Reviewed by me    Family History  Problem Relation Age of Onset  . Heart disease Mother     Heart Disease before age 63  . Hypertension Mother   . Heart attack Mother   . Heart attack Father      Home Medications:   Prior to Admission medications   Medication Sig Start Date End Date Taking? Authorizing Provider  aspirin EC 81 MG tablet Take 1 tablet (81 mg total) by mouth daily. 04/21/16  Yes Richarda OverlieNayana Abrol, MD  clopidogrel (PLAVIX) 75 MG tablet TAKE ONE TABLET BY MOUTH ONCE DAILY Patient taking differently: TAKE 75 MG BY MOUTH ONCE DAILY 08/16/15  Yes Nada LibmanVance W Brabham, MD  esomeprazole (NEXIUM) 40 MG capsule Take 40 mg by mouth at bedtime.  07/01/14 05/05/16 Yes Historical Provider, MD  Homeopathic Products (LEG CRAMP RELIEF PO) Take 2-4 tablets by mouth as needed (for leg cramps).   Yes Historical Provider, MD  HYDROmorphone (DILAUDID) 4 MG tablet Take 4 mg by mouth every 6 (six) hours as needed (for breakthrough pain).  04/03/15  Yes Historical Provider, MD  levETIRAcetam (KEPPRA) 750 MG tablet Take  1 tablet (750 mg total) by mouth 2 (two) times daily. 04/21/16  Yes Richarda OverlieNayana Abrol, MD  lisinopril (PRINIVIL,ZESTRIL) 5 MG tablet Take 1 tablet (5 mg total) by mouth daily. 12/31/13  Yes Daniel J Angiulli, PA-C  LORazepam (ATIVAN) 0.5 MG tablet Take 0.5 mg by mouth every 8 (eight) hours as needed for anxiety.   Yes Historical Provider, MD  morphine (MS CONTIN) 15 MG 12 hr tablet Take 15 mg by mouth 2 (two) times daily. 03/26/16  Yes Historical Provider, MD  nitroGLYCERIN (NITROSTAT) 0.4 MG  SL tablet Place 0.4 mg under the tongue every 5 (five) minutes as needed for chest pain.   Yes Historical Provider, MD  PARoxetine (PAXIL) 20 MG tablet Take 1 tablet (20 mg total) by mouth every morning. 12/31/13  Yes Daniel J Angiulli, PA-C  atorvastatin (LIPITOR) 80 MG tablet Take 1 tablet (80 mg total) by mouth daily at 6 (six) AM. Patient not taking: Reported on 04/18/2016 03/10/14   Kela Millin, MD  clonazePAM (KLONOPIN) 0.5 MG tablet Take 0.5 tablets (0.25 mg total) by mouth 2 (two) times daily. Patient not taking: Reported on 05/05/2016 03/10/14   Kela Millin, MD  cyclobenzaprine (FLEXERIL) 10 MG tablet Take 0.5 tablets (5 mg total) by mouth 2 (two) times daily as needed for muscle spasms. Patient not taking: Reported on 04/18/2016 07/13/14   Ronal Fear, NP  feeding supplement, ENSURE ENLIVE, (ENSURE ENLIVE) LIQD Take 237 mLs by mouth 2 (two) times daily between meals. Patient not taking: Reported on 05/05/2016 04/21/16   Richarda Overlie, MD     Allergies:    No Known Allergies   Physical Exam:   Vitals  Blood pressure 132/79, pulse (!) 56, temperature 98 F (36.7 C), temperature source Oral, resp. rate 14, SpO2 95 %.  Physical Examination: General appearance - alert, well appearing, and in no distress and  Mental status - alert, oriented to person, place, and time,  Eyes - sclera anicteric Neck - supple, no JVD elevation , Chest - clear  to auscultation bilaterally, symmetrical air movement,  Heart - S1  and S2 normal, 3/6 SM Abdomen - soft, right upper quadrant and epigastric area tenderness with negative Murphy, nondistended, no masses or organomegaly Neurological - screening mental status exam normal, neck supple without rigidity, cranial nerves II through XII intact, DTR's normal and symmetric Extremities - no pedal edema noted, intact peripheral pulses  Skin - warm, dry    Data Review:    CBC  Recent Labs Lab 05/05/16 1250  WBC 7.1  HGB 14.1  HCT 42.1  PLT 199  MCV 87.0  MCH 29.1  MCHC 33.5  RDW 13.4   ------------------------------------------------------------------------------------------------------------------  Chemistries   Recent Labs Lab 05/05/16 1250 05/05/16 1405  NA 134*  --   K 3.8  --   CL 100*  --   CO2 27  --   GLUCOSE 104*  --   BUN <5*  --   CREATININE 0.78  --   CALCIUM 9.1  --   AST  --  21  ALT  --  19  ALKPHOS  --  74  BILITOT  --  0.6   ------------------------------------------------------------------------------------------------------------------ CrCl cannot be calculated (Unknown ideal weight.). ------------------------------------------------------------------------------------------------------------------ No results for input(s): TSH, T4TOTAL, T3FREE, THYROIDAB in the last 72 hours.  Invalid input(s): FREET3   Coagulation profile No results for input(s): INR, PROTIME in the last 168 hours. -------------------------------------------------------------------------------------------------------------------  Recent Labs  05/05/16 1405  DDIMER 0.33   -------------------------------------------------------------------------------------------------------------------  Cardiac Enzymes No results for input(s): CKMB, TROPONINI, MYOGLOBIN in the last 168 hours.  Invalid input(s): CK ------------------------------------------------------------------------------------------------------------------    Component Value Date/Time    BNP 38.2 05/05/2016 1405     ---------------------------------------------------------------------------------------------------------------  Urinalysis    Component Value Date/Time   COLORURINE YELLOW 05/05/2016 1415   APPEARANCEUR CLEAR 05/05/2016 1415   LABSPEC 1.009 05/05/2016 1415   PHURINE 7.0 05/05/2016 1415   GLUCOSEU NEGATIVE 05/05/2016 1415   HGBUR NEGATIVE 05/05/2016 1415   BILIRUBINUR NEGATIVE 05/05/2016 1415   KETONESUR NEGATIVE 05/05/2016 1415  PROTEINUR NEGATIVE 05/05/2016 1415   UROBILINOGEN 2.0 (H) 04/12/2015 2010   NITRITE NEGATIVE 05/05/2016 1415   LEUKOCYTESUR NEGATIVE 05/05/2016 1415    ----------------------------------------------------------------------------------------------------------------   Imaging Results:    Dg Chest 2 View  Result Date: 05/05/2016 CLINICAL DATA:  65 year old male with a history of chest pain EXAM: CHEST  2 VIEW COMPARISON:  04/18/2016, CT chest 04/19/2016 FINDINGS: Cardiomediastinal silhouette within normal limits. No central vascular congestion. No confluent airspace disease or pneumothorax.  No pleural effusion. Coronary artery stents. Chronic coarsening of interstitial markings. No displaced fracture.  Degenerative changes of the spine. IMPRESSION: Chronic lung changes without evidence of superimposed acute cardiopulmonary disease. Evidence of prior coronary artery stenting/CAD. Signed, Yvone Neu. Loreta Ave, DO [facility] Electronically Signed   By: Gilmer Mor D.O.   On: 05/05/2016 12:14   US Abdomen Limited  Result Date: 05/05/2016 CLINICAL DATA:  65 year old male with a history of right upper quadrant pain EXAM: US ABDOMEN LIMITED - RIGHT UPPER QUADRANT COMPARISON:  Ultrasound 04/21/2016 FINDINGS: Gallbladder: Similar appearance to the prior ultrasound survey, with hyperechoic debris/material at the neck of the gallbladder with posterior acoustic shadowing. Sonographic  Eulah Pont sign is reported as negative. Gallbladder wall thickness measures 2 mm without striations or pericholecystic fluid. Common bile duct: Diameter: 7 mm Liver: Focal hypoechoic regions at the hilum of the liver similar to the comparison ultrasound survey. IMPRESSION: Cholelithiasis without sonographic evidence of acute cholecystitis. Focal hypoechoic regions of the liver parenchyma at the hilum of the liver, likely representing geographic distribution of fatty infiltration. Signed, Yvone Neu. Loreta Ave, DO [facility] Electronically Signed   By: Gilmer Mor D.O.   On: 05/05/2016 15:26    Radiological Exams on Admission: Dg Chest 2 View  Result Date: 05/05/2016 CLINICAL DATA:  65 year old male with a history of chest pain EXAM: CHEST  2 VIEW COMPARISON:  04/18/2016, CT chest 04/19/2016 FINDINGS: Cardiomediastinal silhouette within normal limits. No central vascular congestion. No confluent airspace disease or pneumothorax.  No pleural effusion. Coronary artery stents. Chronic coarsening of interstitial markings. No displaced fracture.  Degenerative changes of the spine. IMPRESSION: Chronic lung changes without evidence of superimposed acute cardiopulmonary disease. Evidence of prior coronary artery stenting/CAD. Signed, Yvone Neu. Loreta Ave, DO [facility] Electronically Signed   By: Gilmer Mor D.O.   On: 05/05/2016 12:14   US Abdomen Limited  Result Date: 05/05/2016 CLINICAL DATA:  65 year old male with a history of right upper quadrant pain EXAM: US ABDOMEN LIMITED - RIGHT UPPER QUADRANT COMPARISON:  Ultrasound 04/21/2016 FINDINGS: Gallbladder: Similar appearance to the prior ultrasound survey, with hyperechoic debris/material at the neck of the gallbladder with posterior acoustic shadowing. Sonographic Eulah Pont sign is reported as negative. Gallbladder wall thickness measures 2 mm without  striations or pericholecystic fluid. Common bile duct: Diameter: 7 mm Liver: Focal hypoechoic regions at the hilum of the liver similar to the comparison ultrasound survey. IMPRESSION: Cholelithiasis without sonographic evidence of acute cholecystitis. Focal hypoechoic regions of the liver parenchyma at the hilum of the liver, likely representing geographic distribution of fatty infiltration. Signed, Yvone Neu. Loreta Ave, DO [facility] Electronically Signed   By: Gilmer Mor D.O.   On: 05/05/2016 15:26    DVT Prophylaxis SCD/TEDS  AM Labs Ordered, also please review Full Orders  Family Communication: Admission, patients condition and plan of care including tests being ordered have been discussed with the patient and wife  who indicate understanding and agree with the plan   Code Status - Full Code  Likely DC to  home  Condition   Stable  Tameka Hoiland M.D on 05/05/2016 at 5:41 PM   Between 7am to 7pm - Pager - [phone]  After 7pm go to [email or web address] - password [email or web address]  [facility] - Office  [phone]  Dragon dictation system was used to create this note, attempts have been made to correct errors, however presence of uncorrected errors is not a reflection quality of care provided.

## 2016-05-05 NOTE — Consult Note (Signed)
Reason for Consult: Symptomatic cholelithiasis Referring Physician: Dr. Roxan Hockey  Justin Wells is an 65 y.o. male.  HPI: This general and presents with epigastric abdominal pain, nausea, and vomiting. He was recently in the hospital for issues with seizures and he has a significant cardiac history and is on Plavix. During that admission he was having chest pain and epigastric pain but failed to tell the physicians he was also having nausea and vomiting. The discomfort has gotten worse and is now on the right upper quadrant. It is worse with fatty meals. He still has some mild chest pain but no shortness of breath. He has had an ultrasound showing small stones and sludge. He denies jaundice. Bowel movements are normal.  Past Medical History:  Diagnosis Date  . Anxiety   . Arthritis    "hands; back; legs" (04/18/2016)  . Barrett's esophagus   . Cerebrovascular disease   . CHF (congestive heart failure) ([employer])    "said I had this a few years ago; it went away" (04/18/2016)  . Chronic back pain   . Coronary artery disease   . Depression   . Gastroesophageal reflux   . H/O hiatal hernia   . LHTDSKAJ(681.1)    "probably monthly" (01/27/2013)  . Hyperlipidemia   . Hypertension   . Peripheral arterial disease ([place])   . Seizures ([place])    "daily the last 5 days" (04/18/2016)  . Stroke [facility]) 2009  2010   "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, weak right leg and hand; I've had 2 or 3 strokes; can't remember the dates" (01/27/2013)    Past Surgical History:  Procedure Laterality Date  . ABDOMINAL AORTAGRAM N/A 01/26/2013   Procedure: ABDOMINAL Maxcine Ham;  Surgeon: Serafina Mitchell, MD;  Location: [facility] CATH LAB;  Service: Cardiovascular;  Laterality: N/A;  . CARDIAC CATHETERIZATION    . COLONOSCOPY    . CORONARY ANGIOPLASTY WITH STENT PLACEMENT  11/02/2007   "2" (01/27/2013)  . DIAGNOSTIC LAPAROSCOPY     "twice after nissen; it kept coming apart " (01/27/2013)  .  ESOPHAGOGASTRODUODENOSCOPY    . FEMORAL ARTERY STENT Bilateral 01/26/2013  . HERNIA REPAIR    . LAPAROSCOPIC GASTRIC BANDING  2010  . LAPAROSCOPIC NISSEN FUNDOPLICATION  ?[id number]  . LEFT HEART CATHETERIZATION WITH CORONARY ANGIOGRAM N/A 03/09/2014   Procedure: LEFT HEART CATHETERIZATION WITH CORONARY ANGIOGRAM;  Surgeon: Wellington Hampshire, MD;  Location: [place] CATH LAB;  Service: Cardiovascular;  Laterality: N/A;  . LOWER EXTREMITY ANGIOGRAM Left 03/02/2013   Procedure: LOWER EXTREMITY ANGIOGRAM;  Surgeon: Serafina Mitchell, MD;  Location: [facility] CATH LAB;  Service: Cardiovascular;  Laterality: Left;    Family History  Problem Relation Age of Onset  . Heart disease Mother     Heart Disease before age 59  . Hypertension Mother   . Heart attack Mother   . Heart attack Father     Social History:  reports that he quit smoking about 16 years ago. His smoking use included Cigarettes. He has a 70.00 pack-year smoking history. He has never used smokeless tobacco. He reports that he does not drink alcohol or use drugs.  Allergies: No Known Allergies  Medications: I have reviewed the patient's current medications.  Results for orders placed or performed during the hospital encounter of 05/05/16 (from the past 48 hour(s))  Basic metabolic panel     Status: Abnormal   Collection Time: 05/05/16 12:50 PM  Result Value Ref Range   Sodium 134 (L) 135 - 145  mmol/L   Potassium 3.8 3.5 - 5.1 mmol/L   Chloride 100 (L) 101 - 111 mmol/L   CO2 27 22 - 32 mmol/L   Glucose, Bld 104 (H) 65 - 99 mg/dL   BUN <5 (L) 6 - 20 mg/dL   Creatinine, Ser 0.78 0.61 - 1.24 mg/dL   Calcium 9.1 8.9 - 10.3 mg/dL   GFR calc non Af Amer >60 >60 mL/min   GFR calc Af Amer >60 >60 mL/min    Comment: (NOTE) The eGFR has been calculated using the CKD EPI equation. This calculation has not been validated in all clinical situations. eGFR's persistently <60 mL/min signify possible Chronic Kidney Disease.    Anion gap 7 5 - 15  CBC      Status: None   Collection Time: 05/05/16 12:50 PM  Result Value Ref Range   WBC 7.1 4.0 - 10.5 K/uL   RBC 4.84 4.22 - 5.81 MIL/uL   Hemoglobin 14.1 13.0 - 17.0 g/dL   HCT 42.1 39.0 - 52.0 %   MCV 87.0 78.0 - 100.0 fL   MCH 29.1 26.0 - 34.0 pg   MCHC 33.5 30.0 - 36.0 g/dL   RDW 13.4 11.5 - 15.5 %   Platelets 199 150 - 400 K/uL  I-stat troponin, ED     Status: None   Collection Time: 05/05/16  1:03 PM  Result Value Ref Range   Troponin i, poc 0.00 0.00 - 0.08 ng/mL   Comment 3            Comment: Due to the release kinetics of cTnI, a negative result within the first hours of the onset of symptoms does not rule out myocardial infarction with certainty. If myocardial infarction is still suspected, repeat the test at appropriate intervals.   D-dimer, quantitative (not at [facility])     Status: None   Collection Time: 05/05/16  2:05 PM  Result Value Ref Range   D-Dimer, Quant 0.33 0.00 - 0.50 ug/mL-FEU    Comment: (NOTE) At the manufacturer cut-off of 0.50 ug/mL FEU, this assay has been documented to exclude PE with a sensitivity and negative predictive value of 97 to 99%.  At this time, this assay has not been approved by the FDA to exclude DVT/VTE. Results should be correlated with clinical presentation.   Brain natriuretic peptide     Status: None   Collection Time: 05/05/16  2:05 PM  Result Value Ref Range   B Natriuretic Peptide 38.2 0.0 - 100.0 pg/mL  Hepatic function panel     Status: Abnormal   Collection Time: 05/05/16  2:05 PM  Result Value Ref Range   Total Protein 6.8 6.5 - 8.1 g/dL   Albumin 4.0 3.5 - 5.0 g/dL   AST 21 15 - 41 U/L   ALT 19 17 - 63 U/L   Alkaline Phosphatase 74 38 - 126 U/L   Total Bilirubin 0.6 0.3 - 1.2 mg/dL   Bilirubin, Direct <0.1 (L) 0.1 - 0.5 mg/dL   Indirect Bilirubin NOT CALCULATED 0.3 - 0.9 mg/dL  Lipase, blood     Status: None   Collection Time: 05/05/16  2:05 PM  Result Value Ref Range   Lipase 38 11 - 51 U/L  Urinalysis, Routine w  reflex microscopic     Status: None   Collection Time: 05/05/16  2:15 PM  Result Value Ref Range   Color, Urine YELLOW YELLOW   APPearance CLEAR CLEAR   Specific Gravity, Urine 1.009 1.005 - 1.030  pH 7.0 5.0 - 8.0   Glucose, UA NEGATIVE NEGATIVE mg/dL   Hgb urine dipstick NEGATIVE NEGATIVE   Bilirubin Urine NEGATIVE NEGATIVE   Ketones, ur NEGATIVE NEGATIVE mg/dL   Protein, ur NEGATIVE NEGATIVE mg/dL   Nitrite NEGATIVE NEGATIVE   Leukocytes, UA NEGATIVE NEGATIVE    Comment: MICROSCOPIC NOT DONE ON URINES WITH NEGATIVE PROTEIN, BLOOD, LEUKOCYTES, NITRITE, OR GLUCOSE <1000 mg/dL.  I-stat troponin, ED     Status: None   Collection Time: 05/05/16  3:57 PM  Result Value Ref Range   Troponin i, poc 0.00 0.00 - 0.08 ng/mL   Comment 3            Comment: Due to the release kinetics of cTnI, a negative result within the first hours of the onset of symptoms does not rule out myocardial infarction with certainty. If myocardial infarction is still suspected, repeat the test at appropriate intervals.     Dg Chest 2 View  Result Date: 05/05/2016 CLINICAL DATA:  65 year old male with a history of chest pain EXAM: CHEST  2 VIEW COMPARISON:  04/18/2016, CT chest 04/19/2016 FINDINGS: Cardiomediastinal silhouette within normal limits. No central vascular congestion. No confluent airspace disease or pneumothorax.  No pleural effusion. Coronary artery stents. Chronic coarsening of interstitial markings. No displaced fracture.  Degenerative changes of the spine. IMPRESSION: Chronic lung changes without evidence of superimposed acute cardiopulmonary disease. Evidence of prior coronary artery stenting/CAD. Signed, Dulcy Fanny. Earleen Newport, DO [facility]iology Electronically Signed   By: Corrie Mckusick D.O.   On: 05/05/2016 12:14   US Abdomen Limited  Result Date: 05/05/2016 CLINICAL DATA:  65 year old male with a history of right upper quadrant pain EXAM: US  ABDOMEN LIMITED - RIGHT UPPER QUADRANT COMPARISON:  Ultrasound 04/21/2016 FINDINGS: Gallbladder: Similar appearance to the prior ultrasound survey, with hyperechoic debris/material at the neck of the gallbladder with posterior acoustic shadowing. Sonographic Percell Miller sign is reported as negative. Gallbladder wall thickness measures 2 mm without striations or pericholecystic fluid. Common bile duct: Diameter: 7 mm Liver: Focal hypoechoic regions at the hilum of the liver similar to the comparison ultrasound survey. IMPRESSION: Cholelithiasis without sonographic evidence of acute cholecystitis. Focal hypoechoic regions of the liver parenchyma at the hilum of the liver, likely representing geographic distribution of fatty infiltration. Signed, Dulcy Fanny. Earleen Newport, DO [facility] Radiology Electronically Signed   By: Corrie Mckusick D.O.   On: 05/05/2016 15:26    Review of Systems  All other systems reviewed and are negative.  Blood pressure 133/79, pulse (!) 53, temperature 98 F (36.7 C), temperature source Oral, resp. rate 11, SpO2 90 %. Physical Exam  Constitutional: He is oriented to person, place, and time. He appears well-developed and well-nourished. No distress.  HENT:  Head: Normocephalic and atraumatic.  Right Ear: External ear normal.  Left Ear: External ear normal.  Nose: Nose normal.  Mouth/Throat: No oropharyngeal exudate.  Eyes: Conjunctivae are normal. Pupils are equal, round, and reactive to light. Right eye exhibits no discharge. Left eye exhibits no discharge. No scleral icterus.  Neck: Normal range of motion. No tracheal deviation present.  Cardiovascular: Normal rate, regular rhythm and normal heart sounds.   No murmur heard. Respiratory: Effort normal and breath sounds normal. No respiratory distress. He has no wheezes. He has no rales.  GI: Soft. There is tenderness.  There is mild to moderate tenderness in the epigastrium and right  upper quadrant. His abdomen is soft and there  is only mild guarding  Musculoskeletal: Normal range of motion. He exhibits no edema or tenderness.  Lymphadenopathy:    He has no cervical adenopathy.  Neurological: He is alert and oriented to person, place, and time.  Skin: Skin is warm. No rash noted. He is not diaphoretic. No erythema.  Psychiatric: His behavior is normal. Judgment normal.    Assessment/Plan: Symptomatically cholelithiasis  His ultrasound is unremarkable other than stones. There is no gallbladder wall thickening, the bile ducts are normal, and there is no pericholecystic fluid. His white blood count is normal and his liver function tests are normal as well. He will need clearance from cardiology and neurology as to whether he may come off his Plavix and can tolerate general anesthesia. I will also order a HIDA scan to see if there is any evidence of cholecystitis in the event a percutaneous cholecystostomy tube is necessary. We will follow him closely with you.  Freddi Forster A 05/05/2016, 6:35 PM

## 2016-05-05 NOTE — ED Provider Notes (Signed)
[facility]-EMERGENCY DEPT Provider Note   CSN: [id number] Arrival date & time: 05/05/16  1038     History   Chief Complaint Chief Complaint  Patient presents with  . Chest Pain    HPI Justin Wells is a 65 y.o. male with history of CAD, CHF, stroke, seizures who presents with chest pain, right upper quadrant pain, nausea, vomiting. Patient was admitted to the hospital the end of July until August 20. Patient had the symptoms prior to his hospital admission, however they have been getting progressively worse since August 22. Patient began having 3-5 episodes of vomiting on August 22. It is not always postprandial, however patient not able to keep any food or fluids down. Patient has had associated shortness of breath that is worse on exertion. His pain is described as pleuritic. Patient reports his pain radiates to his back. Patient is taking Zofran without relief of his emesis. Patient had a right upper quadrant ultrasound while he was in the hospital which showed cholelithiasis. He was referred to general surgeon, however he has not yet seen the doctor. He called the office this past week and was told that he would not be able to seen any sooner and that he should come to the emergency department. Patient denies any fevers, urinary symptoms. Patient has had associated chills.Patient denies any recent long trips, surgeries, cancer, new leg pain or swelling, or current smoking. Patient quit smoking 15 years ago. 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  HPI  Past Medical History:  Diagnosis Date  . Anxiety   . Arthritis    "hands; back; legs" (04/18/2016)  . Barrett's esophagus   . Cerebrovascular disease   . CHF (congestive heart failure) (HCC)    "said I had this a few years ago; it went away" (04/18/2016)  . Chronic back pain   . Coronary artery disease   . Depression   . Gastroesophageal reflux   . H/O hiatal hernia   . WUJWJXBJ(478.2Headache(784.0)    "probably  monthly" (01/27/2013)  . Hyperlipidemia   . Hypertension   . Peripheral arterial disease (HCC)   . Seizures (HCC)    "daily the last 5 days" (04/18/2016)  . Stroke [facility](HCC) 2009  2010   "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, weak right leg and hand; I've had 2 or 3 strokes; can't remember the dates" (01/27/2013)    Patient Active Problem List   Diagnosis Date Noted  . Epigastric pain   . Spells (HCC)   . Decreased responsiveness 04/12/2015  . Seizure (HCC)   . Acute prostatitis 06/20/2014  . Urgency of micturation 06/20/2014  . Anxiety 04/12/2014  . Chronic cervical pain 04/12/2014  . Panic attack 04/12/2014  . Chest pain 03/08/2014  . PAD (peripheral artery disease) (HCC) 03/08/2014  . Anxiety state 03/08/2014  . Chronic pain syndrome 03/08/2014  . TIA (transient ischemic attack) 03/07/2014  . Burning or prickling sensation 01/06/2014  . Temporary cerebral vascular dysfunction 01/06/2014  . Cerebral infarct (HCC) 01/06/2014  . CVA (cerebral infarction) 12/29/2013  . Hypertension 12/28/2013  . Other and unspecified hyperlipidemia 12/28/2013  . Brainstem infarct not seen on MRI 12/27/2013  . Bad memory 11/18/2013  . Special screening for malignant neoplasm of prostate 11/18/2013  . Ache in joint 07/12/2013  . Clinical depression 03/09/2013  . Generalized OA 03/09/2013  . Numbness and tingling 01/12/2013  . Peripheral vascular disease, unspecified (HCC) 01/12/2013  . Pain in limb  01/12/2013  . Atherosclerosis of native arteries of the extremities with intermittent claudication 01/12/2013  . Hypoglycemia 11/05/2012  . Atherosclerosis of native artery of extremity (HCC) 11/05/2012  . Atherosclerosis of coronary artery 07/04/2011  . Artery disease, cerebral 07/04/2011  . Arteriosclerosis of coronary artery 07/04/2011  . Acid reflux 05/31/2010    Past Surgical History:  Procedure Laterality Date  . ABDOMINAL AORTAGRAM N/A 01/26/2013   Procedure: ABDOMINAL Ronny Flurry;   Surgeon: Nada Libman, MD;  Location: [facility] CATH LAB;  Service: Cardiovascular;  Laterality: N/A;  . CARDIAC CATHETERIZATION    . COLONOSCOPY    . CORONARY ANGIOPLASTY WITH STENT PLACEMENT  11/02/2007   "2" (01/27/2013)  . DIAGNOSTIC LAPAROSCOPY     "twice after nissen; it kept coming apart " (01/27/2013)  . ESOPHAGOGASTRODUODENOSCOPY    . FEMORAL ARTERY STENT Bilateral 01/26/2013  . HERNIA REPAIR    . LAPAROSCOPIC GASTRIC BANDING  2010  . LAPAROSCOPIC NISSEN FUNDOPLICATION  ?2001  . LEFT HEART CATHETERIZATION WITH CORONARY ANGIOGRAM N/A 03/09/2014   Procedure: LEFT HEART CATHETERIZATION WITH CORONARY ANGIOGRAM;  Surgeon: Iran Ouch, MD;  Location: [facility] CATH LAB;  Service: Cardiovascular;  Laterality: N/A;  . LOWER EXTREMITY ANGIOGRAM Left 03/02/2013   Procedure: LOWER EXTREMITY ANGIOGRAM;  Surgeon: Nada Libman, MD;  Location[facility] CATH LAB;  Service: Cardiovascular;  Laterality: Left;       Home Medications    Prior to Admission medications   Medication Sig Start Date End Date Taking? Authorizing Provider  aspirin EC 81 MG tablet Take 1 tablet (81 mg total) by mouth daily. 04/21/16  Yes Richarda Overlie, MD  clopidogrel (PLAVIX) 75 MG tablet TAKE ONE TABLET BY MOUTH ONCE DAILY Patient taking differently: TAKE 75 MG BY MOUTH ONCE DAILY 08/16/15  Yes Nada Libman, MD  esomeprazole (NEXIUM) 40 MG capsule Take 40 mg by mouth at bedtime.  07/01/14 05/05/16 Yes Historical Provider, MD  Homeopathic Products (LEG CRAMP RELIEF PO) Take 2-4 tablets by mouth as needed (for leg cramps).   Yes Historical Provider, MD  HYDROmorphone (DILAUDID) 4 MG tablet Take 4 mg by mouth every 6 (six) hours as needed (for breakthrough pain).  04/03/15  Yes Historical Provider, MD  levETIRAcetam (KEPPRA) 750 MG tablet Take 1 tablet (750 mg total) by mouth 2 (two) times daily. 04/21/16  Yes Richarda Overlie, MD  lisinopril (PRINIVIL,ZESTRIL) 5 MG tablet Take 1 tablet (5 mg total) by mouth daily. 12/31/13  Yes Daniel J Angiulli,  PA-C  LORazepam (ATIVAN) 0.5 MG tablet Take 0.5 mg by mouth every 8 (eight) hours as needed for anxiety.   Yes Historical Provider, MD  morphine (MS CONTIN) 15 MG 12 hr tablet Take 15 mg by mouth 2 (two) times daily. 03/26/16  Yes Historical Provider, MD  nitroGLYCERIN (NITROSTAT) 0.4 MG SL tablet Place 0.4 mg under the tongue every 5 (five) minutes as needed for chest pain.   Yes Historical Provider, MD  PARoxetine (PAXIL) 20 MG tablet Take 1 tablet (20 mg total) by mouth every morning. 12/31/13  Yes Daniel J Angiulli, PA-C  atorvastatin (LIPITOR) 80 MG tablet Take 1 tablet (80 mg total) by mouth daily at 6 (six) AM. Patient not taking: Reported on 04/18/2016 03/10/14   Kela Millin, MD  clonazePAM (KLONOPIN) 0.5 MG tablet Take 0.5 tablets (0.25 mg total) by mouth 2 (two) times daily. Patient not taking: Reported on 05/05/2016 03/10/14   Kela Millin, MD  cyclobenzaprine (FLEXERIL) 10 MG tablet Take 0.5 tablets (5 mg total)  by mouth 2 (two) times daily as needed for muscle spasms. Patient not taking: Reported on 04/18/2016 07/13/14   Ronal Fear, NP  feeding supplement, ENSURE ENLIVE, (ENSURE ENLIVE) LIQD Take 237 mLs by mouth 2 (two) times daily between meals. Patient not taking: Reported on 05/05/2016 04/21/16   Richarda Overlie, MD    Family History Family History  Problem Relation Age of Onset  . Heart disease Mother     Heart Disease before age 104  . Hypertension Mother   . Heart attack Mother   . Heart attack Father     Social History Social History  Substance Use Topics  . Smoking status: Former Smoker    Packs/day: 2.00    Years: 35.00    Types: Cigarettes    Quit date: 01/29/2000  . Smokeless tobacco: Never Used  . Alcohol use No     Comment: 04/18/2016  "quit drinking in 07/1999; I'm a recovering alcoholic"     Allergies   Review of patient's allergies indicates no known allergies.   Review of Systems Review of Systems  Constitutional: Positive for appetite change and  chills. Negative for fever.  HENT: Negative for facial swelling and sore throat.   Respiratory: Negative for shortness of breath.   Cardiovascular: Negative for chest pain.  Gastrointestinal: Positive for abdominal pain, nausea and vomiting. Negative for blood in stool and diarrhea.  Genitourinary: Negative for dysuria.  Musculoskeletal: Positive for back pain.  Skin: Negative for rash and wound.  Neurological: Negative for headaches.  Psychiatric/Behavioral: The patient is not nervous/anxious.      Physical Exam Updated Vital Signs BP 132/79   Pulse (!) 56   Temp 98 F (36.7 C) (Oral)   Resp 14   SpO2 95%   Physical Exam  Constitutional: He appears well-developed and well-nourished. No distress.  HENT:  Head: Normocephalic and atraumatic.  Mouth/Throat: Oropharynx is clear and moist. No oropharyngeal exudate.  Eyes: Conjunctivae are normal. Pupils are equal, round, and reactive to light. Right eye exhibits no discharge. Left eye exhibits no discharge. No scleral icterus.  Neck: Normal range of motion. Neck supple. No thyromegaly present.  Cardiovascular: Normal rate, regular rhythm, normal heart sounds and intact distal pulses.  Exam reveals no gallop and no friction rub.   No murmur heard. Pulmonary/Chest: Effort normal and breath sounds normal. No stridor. No respiratory distress. He has no wheezes. He has no rales. He exhibits tenderness.    Abdominal: Soft. Bowel sounds are normal. He exhibits no distension. There is tenderness in the right upper quadrant, right lower quadrant and epigastric area. There is positive Murphy's sign. There is no rebound, no guarding and no CVA tenderness.    Patient reports chronic right lower quadrant tenderness for many years  Musculoskeletal: He exhibits no edema.  Lymphadenopathy:    He has no cervical adenopathy.  Neurological: He is alert. Coordination normal.  Skin: Skin is warm and dry. No rash noted. He is not diaphoretic. No  pallor.  Psychiatric: He has a normal mood and affect.  Nursing note and vitals reviewed.    ED Treatments / Results  Labs (all labs ordered are listed, but only abnormal results are displayed) Labs Reviewed  BASIC METABOLIC PANEL - Abnormal; Notable for the following:       Result Value   Sodium 134 (*)    Chloride 100 (*)    Glucose, Bld 104 (*)    BUN <5 (*)    All other components within normal  limits  HEPATIC FUNCTION PANEL - Abnormal; Notable for the following:    Bilirubin, Direct <0.1 (*)    All other components within normal limits  CBC  D-DIMER, QUANTITATIVE (NOT AT [facility])  BRAIN NATRIURETIC PEPTIDE  URINALYSIS, ROUTINE W REFLEX MICROSCOPIC (NOT AT [facility])  LIPASE, BLOOD  I-STAT TROPOININ, ED  I-STAT TROPOININ, ED    EKG  EKG Interpretation  Date/Time:  Sunday May 05 2016 10:59:39 EDT Ventricular Rate:  61 PR Interval:  144 QRS Duration: 88 QT Interval:  424 QTC Calculation: 426 R Axis:   68 Text Interpretation:  Normal sinus rhythm Normal ECG since previous tracing, rate has improved Confirmed by LITTLE MD, RACHEL [phone]) on 05/05/2016 11:52:36 AM       Radiology Dg Chest 2 View  Result Date: 05/05/2016 CLINICAL DATA:  65 year old male with a history of chest pain EXAM: CHEST  2 VIEW COMPARISON:  04/18/2016, CT chest 04/19/2016 FINDINGS: Cardiomediastinal silhouette within normal limits. No central vascular congestion. No confluent airspace disease or pneumothorax.  No pleural effusion. Coronary artery stents. Chronic coarsening of interstitial markings. No displaced fracture.  Degenerative changes of the spine. IMPRESSION: Chronic lung changes without evidence of superimposed acute cardiopulmonary disease. Evidence of prior coronary artery stenting/CAD. Signed, Yvone Neu. Loreta Ave, DO [facility] Radiology Electronically Signed   By: Gilmer Mor D.O.   On: 05/05/2016 12:14   US Abdomen Limited  Result Date:  05/05/2016 CLINICAL DATA:  65 year old male with a history of right upper quadrant pain EXAM: US ABDOMEN LIMITED - RIGHT UPPER QUADRANT COMPARISON:  Ultrasound 04/21/2016 FINDINGS: Gallbladder: Similar appearance to the prior ultrasound survey, with hyperechoic debris/material at the neck of the gallbladder with posterior acoustic shadowing. Sonographic Eulah Pont sign is reported as negative. Gallbladder wall thickness measures 2 mm without striations or pericholecystic fluid. Common bile duct: Diameter: 7 mm Liver: Focal hypoechoic regions at the hilum of the liver similar to the comparison ultrasound survey. IMPRESSION: Cholelithiasis without sonographic evidence of acute cholecystitis. Focal hypoechoic regions of the liver parenchyma at the hilum of the liver, likely representing geographic distribution of fatty infiltration. Signed, Yvone Neu. Loreta Ave, DO [facility] Radiology Electronically Signed   By: Gilmer Mor D.O.   On: 05/05/2016 15:26    Procedures Procedures (including critical care time)  Medications Ordered in ED Medications  ondansetron (ZOFRAN) injection 4 mg (4 mg Intravenous Given 05/05/16 1408)  morphine 4 MG/ML injection 4 mg (4 mg Intravenous Given 05/05/16 1408)  sodium chloride 0.9 % bolus 1,000 mL (0 mLs Intravenous Stopped 05/05/16 1547)  morphine 4 MG/ML injection 4 mg (4 mg Intravenous Given 05/05/16 1547)     Initial Impression / Assessment and Plan / ED Course  I have reviewed the triage vital signs and the nursing notes.  Pertinent labs & imaging results that were available during my care of the patient were reviewed by me and considered in my medical decision making (see chart for details).  Clinical Course   Morphine improved her symptoms markedly, patient asked for more pain medicine.  Patient with failed outpatient treatment for cholelithiasis. CBC unremarkable. BMP shows sodium 134, chloride 100, glucose 104, BUN <5.  Hepatic function panel shows direct bilirubin <0.1. Lipase 38. BNP 38.2. D-dimer 0.33. Delta troponin negative. EKG normal. CXR shows chronic lung changes without evidence of superimposed acute cardiopulmonary disease; evidence of prior coronary artery stenting/CAD. UA negative. R Yoo Q ultrasound shows cholelithiasis without sonographic evidence of acute cholecystitis; focal  hypoechoic regions of the liver parenchyma at the hilum of the liver, likely representing geographic distribution of fatty infiltration. I spoke with general surgeon, Dr. Magnus Ivan, who requests hospitalist admission. I spoke with Dr. Mariea Clonts who will admit the patient for further evaluation and treatment. Dr. Magnus Ivan states he will need cardiac and neuro clearance prior to surgery. Patient also evaluated by Dr. Clarene Duke who guided the patient's management and agrees with plan. Patient vitals stable throughout ED course.  Final Clinical Impressions(s) / ED Diagnoses   Final diagnoses:  Calculus of gallbladder without cholecystitis without obstruction    New Prescriptions New Prescriptions   No medications on file     Emi Holes, Cordelia Poche 05/05/16 1642    Laurence Spates, MD 05/06/16 [phone]

## 2016-05-05 NOTE — Progress Notes (Signed)
Received patient from ED. Patient AOx4, ambulatory, pain at 4/10, VS stable.  Patient oriented to room, bed controls and call light.  Hooked patient to telemetry box #5 and CNA gave chicken broth, water and sprite.  Patient resing on bed comfortably.

## 2016-05-06 ENCOUNTER — Inpatient Hospital Stay (HOSPITAL_COMMUNITY): Payer: Medicare HMO

## 2016-05-06 DIAGNOSIS — I2510 Atherosclerotic heart disease of native coronary artery without angina pectoris: Secondary | ICD-10-CM

## 2016-05-06 DIAGNOSIS — K8000 Calculus of gallbladder with acute cholecystitis without obstruction: Principal | ICD-10-CM

## 2016-05-06 DIAGNOSIS — Z955 Presence of coronary angioplasty implant and graft: Secondary | ICD-10-CM

## 2016-05-06 LAB — CBC
HEMATOCRIT: 40.6 % (ref 39.0–52.0)
HEMOGLOBIN: 13.1 g/dL (ref 13.0–17.0)
MCH: 28.4 pg (ref 26.0–34.0)
MCHC: 32.3 g/dL (ref 30.0–36.0)
MCV: 88.1 fL (ref 78.0–100.0)
Platelets: 167 10*3/uL (ref 150–400)
RBC: 4.61 MIL/uL (ref 4.22–5.81)
RDW: 13.6 % (ref 11.5–15.5)
WBC: 6.2 10*3/uL (ref 4.0–10.5)

## 2016-05-06 LAB — BASIC METABOLIC PANEL
ANION GAP: 4 — AB (ref 5–15)
BUN: 5 mg/dL — ABNORMAL LOW (ref 6–20)
CO2: 30 mmol/L (ref 22–32)
Calcium: 9 mg/dL (ref 8.9–10.3)
Chloride: 104 mmol/L (ref 101–111)
Creatinine, Ser: 1 mg/dL (ref 0.61–1.24)
GFR calc Af Amer: >60 mL/min (ref >60)
GLUCOSE: 125 mg/dL — AB (ref 65–99)
Potassium: 4.1 mmol/L (ref 3.5–5.1)
Sodium: 138 mmol/L (ref 135–145)

## 2016-05-06 MED ORDER — PIPERACILLIN-TAZOBACTAM 3.375 G IVPB
3.3750 g | Freq: Three times a day (TID) | INTRAVENOUS | Status: DC
  Administered 2016-05-06 – 2016-05-09 (×10): 3.375 g via INTRAVENOUS
  Filled 2016-05-06 (×12): qty 50

## 2016-05-06 MED ORDER — TECHNETIUM TC 99M MEBROFENIN IV KIT
5.0000 | PACK | Freq: Once | INTRAVENOUS | Status: AC | PRN
  Administered 2016-05-06: 5 via INTRAVENOUS

## 2016-05-06 MED ORDER — MORPHINE SULFATE (PF) 4 MG/ML IV SOLN
4.0000 mg | INTRAVENOUS | Status: DC | PRN
  Administered 2016-05-06 – 2016-05-09 (×18): 4 mg via INTRAVENOUS
  Filled 2016-05-06 (×18): qty 1

## 2016-05-06 NOTE — Progress Notes (Signed)
Justin Wells ZOX:[id number] DOB: 20-Mar-1951 DOA: 05/05/2016 PCP: Carmin Richmond, MD  Brief narrative:  65 y/o ? CAd s/p stent 2009-patent stents noted on cath 03/2014. Nuc stress 04/2016 was neg CVA Htn Seizure disorder Hypertension Hyperlipidemia Chronic pain syndrome  Recently admitted 8/17-8/22,017 with seizures felt to be consistent with focal seizures more so than TIA versus hypoglycemia. He had an EEG MRI of the brain and was discharged with higher dose of Keppra 750 twice a day During that admission as well he had some chest pain. He was worked up for this.  States that he had some epigastric tenderness which got worse while he was discharged and came back to the hospital on 05/05/2016 and was found to have cholecystitis  Past medical history-As per Problem list Chart reviewed as below-   Consultants:  gen sur  Procedures:  Hida pedning  Antibiotics:  Zosyn   Subjective   Patient is awake alert hungry and in 7/10 pain He has not been able to get morphine because of his HIDA scan that is scheduled He has not eaten anything Overall he feels about the same as she did when he came in in terms of his pain He has no other fever or other issues States the pain is radiating to the back    Objective    Interim History:   Telemetry:    Objective: Vitals:   05/05/16 1815 05/05/16 1943 05/05/16 2127 05/06/16 0617  BP: 133/79 136/77 (!) 143/77 136/72  Pulse: (!) 53 (!) 54 (!) 54 (!) 55  Resp: 11 15 16 17   Temp:  97.6 F (36.4 C) 97.6 F (36.4 C) 97.5 F (36.4 C)  TempSrc:  Oral Oral Oral  SpO2: 90% 98% 95% 95%  Weight:  94.8 kg (209 lb)    Height:  5\' 10"  (1.778 m)      Intake/Output Summary (Last 24 hours) at 05/06/16 1106 Last data filed at 05/06/16 1014  Gross per 24 hour  Intake              840 ml  Output             2125 ml  Net            -1285 ml    Exam:  Alert oriented pleasant, no pallor no icterus no JVD S1-S2 no murmur rub or  gallop chest clinically clear regular rate rhythm no added sounds Abdomen obese nontender nondistended epigastric tenderness present, bowel sounds are not heard slightly bloated no rebound no guarding No lower extremity edema Neurologically intact   Data Reviewed: Basic Metabolic Panel:  Recent Labs Lab 05/05/16 1250 05/06/16 0301  NA 134* 138  K 3.8 4.1  CL 100* 104  CO2 27 30  GLUCOSE 104* 125*  BUN <5* 5*  CREATININE 0.78 1.00  CALCIUM 9.1 9.0   Liver Function Tests:  Recent Labs Lab 05/05/16 1405  AST 21  ALT 19  ALKPHOS 74  BILITOT 0.6  PROT 6.8  ALBUMIN 4.0    Recent Labs Lab 05/05/16 1405  LIPASE 38   No results for input(s): AMMONIA in the last 168 hours. CBC:  Recent Labs Lab 05/05/16 1250 05/06/16 0301  WBC 7.1 6.2  HGB 14.1 13.1  HCT 42.1 40.6  MCV 87.0 88.1  PLT 199 167   Cardiac Enzymes: No results for input(s): CKTOTAL, CKMB, CKMBINDEX, TROPONINI in the last 168 hours. BNP: Invalid input(s): POCBNP CBG: No results for input(s): GLUCAP in the last 168  hours.  No results found for this or any previous visit (from the past 240 hour(s)).   Studies:              All Imaging reviewed and is as per above notation   Scheduled Meds: . aspirin EC  81 mg Oral Daily  . levETIRAcetam  750 mg Oral BID  . lisinopril  5 mg Oral Daily  . morphine  15 mg Oral BID  . pantoprazole  40 mg Oral Daily  . PARoxetine  20 mg Oral Daily  . piperacillin-tazobactam (ZOSYN)  IV  3.375 g Intravenous Q8H  . senna  1 tablet Oral BID  . sodium chloride flush  3 mL Intravenous Q12H   Continuous Infusions:    Assessment/Plan:  1. Acute cholecystitis, appreciate general surgery input, patient has been taken off Plavix as of 05/05/16, [facility] risk procedure and probably can have this done-looks like he will have it done on Thursday 2. Focal seizure history-continue Keppra 750 by mouth twice a day, monitor for  seizure activity 3. CAD, recent stress testing last admission 04/2016 within normal limits-continue aspirin alone 81 mg, question whether after being out from stenting for this long would warrant further Plavix, continue lisinopril 5 daily-note that patient is not on a beta blocker at this time 4. Reflux disease when possible continue pantoprazole 40 daily 5. Bipolar continue Paxil 20 daily, lorazepam 0.5 every 8 hourly,  6. chronic pain continue MS Contin 15 twice a day, we have held off for right now hydromorphone 4 mg every 6 when necessary breakthrough-pain medications should be managed as an outpatient by his primary physician and he'll receive only limited postop course as per general surgeon's instructions--once he has HIDA scan performed we'll place him back on pain medications for pain control 7. Hyperlipidemia-resume statin on discharge   inpatient status at present time  Appreciate in advance cardiology input, will get further input from general surgery moving forward Repeat labs a.m. Pain management Monitor     Pleas KochJai Panagiota Perfetti, MD  [facility] Pager [phone] 05/06/2016, 11:06 AM    LOS: 1 day

## 2016-05-06 NOTE — Progress Notes (Signed)
Subjective: Still has right upper quadrant pain No nausea or vomiting Afebrile.  Normotensive. WBC 6200.  Hemoglobin 13.1.  Lipase and LFTs normal.  Troponins negative.  BNP 38.2. Ultrasound shows gallstones but no acute inflammatory change, Hepatobiliary scan pending  He last took Plavix on Sunday morning,9/3. Cardiology consult pending.      Objective: Vital signs in last 24 hours: Temp:  [97.5 F (36.4 C)-97.6 F (36.4 C)] 97.5 F (36.4 C) (09/04 0617) Pulse Rate:  [50-63] 55 (09/04 0617) Resp:  [9-19] 17 (09/04 0617) BP: (113-144)/(72-92) 136/72 (09/04 0617) SpO2:  [90 %-98 %] 95 % (09/04 0617) Weight:  [94.8 kg (209 lb)] 94.8 kg (209 lb) (09/03 1943)    Intake/Output from previous day: 09/03 0701 - 09/04 0700 In: 840 [P.O.:840] Out: 1725 [Urine:1725] Intake/Output this shift: Total I/O In: 0  Out: 400 [Urine:400]  General appearance: Alert.  Mild distress from upper abdominal pain no status normal Resp: clear to auscultation bilaterally GI: Soft.  Not distended.  Tender epigastrium and right upper quadrant.  No mass.  Lab Results:   Recent Labs  05/05/16 1250 05/06/16 0301  WBC 7.1 6.2  HGB 14.1 13.1  HCT 42.1 40.6  PLT 199 167   BMET  Recent Labs  05/05/16 1250 05/06/16 0301  NA 134* 138  K 3.8 4.1  CL 100* 104  CO2 27 30  GLUCOSE 104* 125*  BUN <5* 5*  CREATININE 0.78 1.00  CALCIUM 9.1 9.0   PT/INR No results for input(s): LABPROT, INR in the last 72 hours. ABG No results for input(s): PHART, HCO3 in the last 72 hours.  Invalid input(s): PCO2, PO2  Studies/Results: Dg Chest 2 View  Result Date: 05/05/2016 CLINICAL DATA:  65 year old male with a history of chest pain EXAM: CHEST  2 VIEW COMPARISON:  04/18/2016, CT chest 04/19/2016 FINDINGS: Cardiomediastinal silhouette within normal limits. No central vascular congestion. No confluent airspace disease or pneumothorax.  No pleural effusion. Coronary artery stents. Chronic coarsening  of interstitial markings. No displaced fracture.  Degenerative changes of the spine. IMPRESSION: Chronic lung changes without evidence of superimposed acute cardiopulmonary disease. Evidence of prior coronary artery stenting/CAD. Signed, Yvone Neu. Loreta Ave, DO [facility] Radiology Electronically Signed   By: Gilmer Mor D.O.   On: 05/05/2016 12:14   US Abdomen Limited  Result Date: 05/05/2016 CLINICAL DATA:  65 year old male with a history of right upper quadrant pain EXAM: US ABDOMEN LIMITED - RIGHT UPPER QUADRANT COMPARISON:  Ultrasound 04/21/2016 FINDINGS: Gallbladder: Similar appearance to the prior ultrasound survey, with hyperechoic debris/material at the neck of the gallbladder with posterior acoustic shadowing. Sonographic Eulah Pont sign is reported as negative. Gallbladder wall thickness measures 2 mm without striations or pericholecystic fluid. Common bile duct: Diameter: 7 mm Liver: Focal hypoechoic regions at the hilum of the liver similar to the comparison ultrasound survey. IMPRESSION: Cholelithiasis without sonographic evidence of acute cholecystitis. Focal hypoechoic regions of the liver parenchyma at the hilum of the liver, likely representing geographic distribution of fatty infiltration. Signed, Yvone Neu. Loreta Ave, DO [facility] Radiology Electronically Signed   By: Gilmer Mor D.O.   On: 05/05/2016 15:26    Anti-infectives: Anti-infectives    Start     Dose/Rate Route Frequency Ordered Stop   05/06/16 1100  piperacillin-tazobactam (ZOSYN) IVPB 3.375 g     3.375 g 12.5 mL/hr over 240 Minutes Intravenous Every 8 hours 05/06/16 1056        Assessment/Plan:  Symptomatic gallstones.  His pain and tenderness are out of proportion to the lab and ultrasound findings Continue nothing by mouth Empiric antibiotics Hepatobiliary scan Holding Plavix Cholecystectomy Wednesday, if okay with  cardiology  CAD with reportedly preserved EF History coronary stents Platelet inhibition due to Plavix, currently on hold History Nissen fundoplication which required more than 1 redo History TIA but no stroke Hypertension Seizure disorder-on Keppra     LOS: 1 day    Carnisha Feltz M 05/06/2016

## 2016-05-06 NOTE — Progress Notes (Signed)
General surgery  Stable but still tender right upper quadrant I have started IV Zosyn pending hepatobiliary scan  Last Plavix was yesterday morning Plan cholecystectomy Wednesday, unless he deteriorates   Lacora Folmer M. Derrell LollingIngram, M.D., [facility] General and Minimally invasive Surgery Breast and Colorectal Surgery

## 2016-05-06 NOTE — Consult Note (Signed)
Patient ID: Justin Wells MRN: [id number], DOB/AGE: 63-May-1952   Admit date: 05/05/2016   Primary Physician: Carmin Richmond, MD Primary Cardiologist: Dr. Allyson Sabal  Reason for Consult: Surgical Clearance Requesting MD: Dr. Magnus Ivan, General Surgery.  Pt. Profile:  65 y/o male with h/o CAD admitted for symptomatic cholelithiasis. Cardiology consulted for surgical clearance.   Problem List  Past Medical History:  Diagnosis Date  . Anxiety   . Arthritis    "hands; back; legs" (04/18/2016)  . Barrett's esophagus   . Cerebrovascular disease   . CHF (congestive heart failure) (HCC)    "said I had this a few years ago; it went away" (04/18/2016)  . Chronic back pain   . Coronary artery disease   . Depression   . Gastroesophageal reflux   . H/O hiatal hernia   . WUJWJXBJ(478.2)    "probably monthly" (01/27/2013)  . Hyperlipidemia   . Hypertension   . Peripheral arterial disease (HCC)   . Seizures (HCC)    "daily the last 5 days" (04/18/2016)  . Stroke [facility]) 2009  2010   "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, weak right leg and hand; I've had 2 or 3 strokes; can't remember the dates" (01/27/2013)    Past Surgical History:  Procedure Laterality Date  . ABDOMINAL AORTAGRAM N/A 01/26/2013   Procedure: ABDOMINAL Ronny Flurry;  Surgeon: Nada Libman, MD;  Location: [facility] CATH LAB;  Service: Cardiovascular;  Laterality: N/A;  . CARDIAC CATHETERIZATION    . COLONOSCOPY    . CORONARY ANGIOPLASTY WITH STENT PLACEMENT  11/02/2007   "2" (01/27/2013)  . DIAGNOSTIC LAPAROSCOPY     "twice after nissen; it kept coming apart " (01/27/2013)  . ESOPHAGOGASTRODUODENOSCOPY    . FEMORAL ARTERY STENT Bilateral 01/26/2013  . HERNIA REPAIR    . LAPAROSCOPIC GASTRIC BANDING  2010  . LAPAROSCOPIC NISSEN FUNDOPLICATION  ?2001  . LEFT HEART CATHETERIZATION WITH CORONARY ANGIOGRAM N/A 03/09/2014   Procedure: LEFT HEART CATHETERIZATION WITH CORONARY ANGIOGRAM;  Surgeon: Iran Ouch, MD;  Location: [facility]  CATH LAB;  Service: Cardiovascular;  Laterality: N/A;  . LOWER EXTREMITY ANGIOGRAM Left 03/02/2013   Procedure: LOWER EXTREMITY ANGIOGRAM;  Surgeon: Nada Libman, MD;  Location: [facility] CATH LAB;  Service: Cardiovascular;  Laterality: Left;     Allergies  No Known Allergies  HPI  Justin Wells is a 65 y.o. male with past medical history of CAD (s/p DES to 2nd Mrg and LCx in 2009, cath in 03/2014 showing patent stent in LCx), CVA in 2009 in setting of cardiac cath, PVD (s/p L SFA stenting in 2014), HTN, HLD, and seizure disorder who presented to [facility] ED on 05/05/16 with persistent nausea vomiting and right upper quadrant pain. Admitted by IM and evaluated by general surgery. Felt to be symptomatic cholelithiasis. He will likely need surgery. Cardiology consulted for surgical clearance.   He was recently admitted 04/18/16 with seizure activity. During his admission, he complained of CP. Cardiac enzymes were cycled and were negative. He underwent a NST on 8/19 that was negative for ischemia. EF was normal by echo at 55-60%. He is on Plavix.  Other than is abdominal pain, he denies any anginal symptoms. No chest pain or dyspnea. He is able to walk up a flight of stairs without angina. Admission EKG shows NSR w/o ischemia. Plavix is on hold.   Home Medications  Prior to Admission medications   Medication Sig Start Date End Date Taking? Authorizing Provider  aspirin EC 81  MG tablet Take 1 tablet (81 mg total) by mouth daily. 04/21/16  Yes Richarda Overlie, MD  clopidogrel (PLAVIX) 75 MG tablet TAKE ONE TABLET BY MOUTH ONCE DAILY Patient taking differently: TAKE 75 MG BY MOUTH ONCE DAILY 08/16/15  Yes Nada Libman, MD  esomeprazole (NEXIUM) 40 MG capsule Take 40 mg by mouth at bedtime.  07/01/14 05/05/16 Yes Historical Provider, MD  Homeopathic Products (LEG CRAMP RELIEF PO) Take 2-4 tablets by mouth as needed (for leg cramps).   Yes Historical Provider, MD  HYDROmorphone (DILAUDID) 4 MG tablet Take  4 mg by mouth every 6 (six) hours as needed (for breakthrough pain).  04/03/15  Yes Historical Provider, MD  levETIRAcetam (KEPPRA) 750 MG tablet Take 1 tablet (750 mg total) by mouth 2 (two) times daily. 04/21/16  Yes Richarda Overlie, MD  lisinopril (PRINIVIL,ZESTRIL) 5 MG tablet Take 1 tablet (5 mg total) by mouth daily. 12/31/13  Yes Daniel J Angiulli, PA-C  LORazepam (ATIVAN) 0.5 MG tablet Take 0.5 mg by mouth every 8 (eight) hours as needed for anxiety.   Yes Historical Provider, MD  morphine (MS CONTIN) 15 MG 12 hr tablet Take 15 mg by mouth 2 (two) times daily. 03/26/16  Yes Historical Provider, MD  nitroGLYCERIN (NITROSTAT) 0.4 MG SL tablet Place 0.4 mg under the tongue every 5 (five) minutes as needed for chest pain.   Yes Historical Provider, MD  PARoxetine (PAXIL) 20 MG tablet Take 1 tablet (20 mg total) by mouth every morning. 12/31/13  Yes Daniel J Angiulli, PA-C  atorvastatin (LIPITOR) 80 MG tablet Take 1 tablet (80 mg total) by mouth daily at 6 (six) AM. Patient not taking: Reported on 04/18/2016 03/10/14   Kela Millin, MD  clonazePAM (KLONOPIN) 0.5 MG tablet Take 0.5 tablets (0.25 mg total) by mouth 2 (two) times daily. Patient not taking: Reported on 05/05/2016 03/10/14   Kela Millin, MD  cyclobenzaprine (FLEXERIL) 10 MG tablet Take 0.5 tablets (5 mg total) by mouth 2 (two) times daily as needed for muscle spasms. Patient not taking: Reported on 04/18/2016 07/13/14   Ronal Fear, NP  feeding supplement, ENSURE ENLIVE, (ENSURE ENLIVE) LIQD Take 237 mLs by mouth 2 (two) times daily between meals. Patient not taking: Reported on 05/05/2016 04/21/16   Richarda Overlie, MD    Family History  Family History  Problem Relation Age of Onset  . Heart disease Mother     Heart Disease before age 68  . Hypertension Mother   . Heart attack Mother   . Heart attack Father     Social History  Social History   Social History  . Marital status: Married    Spouse name: sheryl  . Number of children: 3    . Years of education: 11th   Occupational History  . WELDER [employer]   Social History Main Topics  . Smoking status: Former Smoker    Packs/day: 2.00    Years: 35.00    Types: Cigarettes    Quit date: 01/29/2000  . Smokeless tobacco: Never Used  . 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 Drug use: No  . Sexual activity: Not Currently   Other Topics Concern  . Not on file   Social History Narrative   Patient lives with wife.   Caffeine Use: greater than 5 servings daily   Patient is right handed.   Patient has 11th grade  education.     Review of Systems General:  No chills, fever, night sweats or weight changes.  Cardiovascular:  No chest pain, dyspnea on exertion, edema, orthopnea, palpitations, paroxysmal nocturnal dyspnea. Dermatological: No rash, lesions/masses Respiratory: No cough, dyspnea Urologic: No hematuria, dysuria Abdominal:   No nausea, vomiting, diarrhea, bright red blood per rectum, melena, or hematemesis Neurologic:  No visual changes, wkns, changes in mental status. All other systems reviewed and are otherwise negative except as noted above.  Physical Exam  Blood pressure 136/72, pulse (!) 55, temperature 97.5 F (36.4 C), temperature source Oral, resp. rate 17, height 5\' 10"  (1.778 m), weight 209 lb (94.8 kg), SpO2 95 %.  General: Pleasant, NAD Psych: Normal affect. Neuro: Alert and oriented X 3. Moves all extremities spontaneously. HEENT: Normal  Neck: Supple without bruits or JVD. Lungs:  Resp regular and unlabored, CTA. Heart: RRR no s3, s4, or murmurs. Abdomen: Soft, non-tender, non-distended, BS + x 4.  Extremities: No clubbing, cyanosis or edema. DP/PT/Radials 2+ and equal bilaterally.  Labs  Troponin [facility] of Care Test)  Recent Labs  05/05/16 1557  TROPIPOC 0.00   No results for input(s): CKTOTAL, CKMB, TROPONINI in the last 72 hours. Lab Results  Component  Value Date   WBC 6.2 05/06/2016   HGB 13.1 05/06/2016   HCT 40.6 05/06/2016   MCV 88.1 05/06/2016   PLT 167 05/06/2016    Recent Labs Lab 05/05/16 1405 05/06/16 0301  NA  --  138  K  --  4.1  CL  --  104  CO2  --  30  BUN  --  5*  CREATININE  --  1.00  CALCIUM  --  9.0  PROT 6.8  --   BILITOT 0.6  --   ALKPHOS 74  --   ALT 19  --   AST 21  --   GLUCOSE  --  125*   Lab Results  Component Value Date   CHOL 157 04/18/2016   HDL 39 (L) 04/18/2016   LDLCALC 97 04/18/2016   TRIG 106 04/18/2016   Lab Results  Component Value Date   DDIMER 0.33 05/05/2016     Radiology/Studies  Dg Chest 2 View  Result Date: 05/05/2016 CLINICAL DATA:  65 year old male with a history of chest pain EXAM: CHEST  2 VIEW COMPARISON:  04/18/2016, CT chest 04/19/2016 FINDINGS: Cardiomediastinal silhouette within normal limits. No central vascular congestion. No confluent airspace disease or pneumothorax.  No pleural effusion. Coronary artery stents. Chronic coarsening of interstitial markings. No displaced fracture.  Degenerative changes of the spine. IMPRESSION: Chronic lung changes without evidence of superimposed acute cardiopulmonary disease. Evidence of prior coronary artery stenting/CAD. Signed, Yvone Neu. Loreta Ave, DO [facility] Radiology Electronically Signed   By: Gilmer Mor D.O.   On: 05/05/2016 12:14   Dg Abd 1 View  Result Date: 04/19/2016 CLINICAL DATA:  Acute onset of epigastric abdominal pain. Initial encounter. EXAM: ABDOMEN - 1 VIEW COMPARISON:  Chest and abdominal radiographs performed 09/02/2008 FINDINGS: The visualized bowel gas pattern is unremarkable. Scattered air and stool filled loops of colon are seen; no abnormal dilatation of small bowel loops is seen to suggest small bowel obstruction. No free intra-abdominal air is identified, though evaluation for free air is limited on a single supine view. The visualized osseous structures  are within normal limits; the sacroiliac joints are unremarkable in appearance. IMPRESSION: Unremarkable bowel gas pattern; no free intra-abdominal air seen. Moderate amount of stool noted  in the colon. Electronically Signed   By: Roanna Raider M.D.   On: 04/19/2016 02:06   Mr Maxine Glenn Head Wo Contrast  Result Date: 04/19/2016 CLINICAL DATA:  65 y/o M; PE recurrence. Type spells for several years with feeling of dizziness and confusion. Coal the concern for posterior circulation hypoperfusion versus seizures. EXAM: MRI HEAD WITHOUT CONTRAST MRA HEAD WITHOUT CONTRAST TECHNIQUE: Multiplanar, multiecho pulse sequences of the brain and surrounding structures were obtained without intravenous contrast. Angiographic images of the head were obtained using MRA technique without contrast. COMPARISON:  MRI of the brain dated 04/13/2015. MRA of the head dated 12/27/2013. FINDINGS: MRI HEAD FINDINGS Brain: No diffusion restriction to suggest acute or early subacute infarct. No abnormal susceptibility hypointensity to indicate intracranial hemorrhage. Mild chronic microvascular ischemic changes are stable. No focal mass effect. No gross structural abnormality, disorder cortical formation, or gray matter heterotopia is identified. Hippocampi by are symmetric in size bilaterally. Extra-axial space: Normal ventricular size. No midline shift. No effacement of basilar cisterns. No extra-axial collection is identified. Proximal intracranial flow voids are maintained. No abnormality of the cervical medullary junction. Other: Mild diffuse paranasal sinus mucosal thickening. Pain partial opacification of posterior ethmoid air cells. No abnormal signal of the mastoid air cells. Orbits are unremarkable. Calvarium is unremarkable. MRA HEAD FINDINGS Internal carotid arteries:  Patent. Anterior cerebral arteries:  Patent. Middle cerebral arteries: Patent. Anterior communicating artery: Patent. Posterior communicating arteries: Patent left  posterior communicating artery. No right kidney Posterior cerebral arteries: Patent. Apparent decreased flow related signal within the left posterior cerebral circulation is probably due to fetal PCA origin and signal saturation. No proximal stenosis is identified. Basilar artery:  Patent.  Proximal fenestration. Vertebral arteries:  Patent. No evidence of high-grade stenosis, large vessel occlusion, or aneurysm unless noted above. IMPRESSION: 1. No occlusion, aneurysm, dissection, or significant stenosis is identified. Mild motion artifact of the MRA sequence. No gross interval change. 2. No evidence of acute infarct, intracranial hemorrhage, focal mass effect, or hydrocephalus. Stable mild chronic microvascular ischemic changes and parenchymal volume loss. 3. No structural abnormality identified. Electronically Signed   By: Mitzi Hansen M.D.   On: 04/19/2016 02:16   Mr Brain Wo Contrast  Result Date: 04/19/2016 CLINICAL DATA:  65 y/o M; PE recurrence. Type spells for several years with feeling of dizziness and confusion. Coal the concern for posterior circulation hypoperfusion versus seizures. EXAM: MRI HEAD WITHOUT CONTRAST MRA HEAD WITHOUT CONTRAST TECHNIQUE: Multiplanar, multiecho pulse sequences of the brain and surrounding structures were obtained without intravenous contrast. Angiographic images of the head were obtained using MRA technique without contrast. COMPARISON:  MRI of the brain dated 04/13/2015. MRA of the head dated 12/27/2013. FINDINGS: MRI HEAD FINDINGS Brain: No diffusion restriction to suggest acute or early subacute infarct. No abnormal susceptibility hypointensity to indicate intracranial hemorrhage. Mild chronic microvascular ischemic changes are stable. No focal mass effect. No gross structural abnormality, disorder cortical formation, or gray matter heterotopia is identified. Hippocampi by are symmetric in size bilaterally. Extra-axial space: Normal ventricular size. No  midline shift. No effacement of basilar cisterns. No extra-axial collection is identified. Proximal intracranial flow voids are maintained. No abnormality of the cervical medullary junction. Other: Mild diffuse paranasal sinus mucosal thickening. Pain partial opacification of posterior ethmoid air cells. No abnormal signal of the mastoid air cells. Orbits are unremarkable. Calvarium is unremarkable. MRA HEAD FINDINGS Internal carotid arteries:  Patent. Anterior cerebral arteries:  Patent. Middle cerebral arteries: Patent. Anterior communicating artery: Patent. Posterior communicating arteries:  Patent left posterior communicating artery. No right kidney Posterior cerebral arteries: Patent. Apparent decreased flow related signal within the left posterior cerebral circulation is probably due to fetal PCA origin and signal saturation. No proximal stenosis is identified. Basilar artery:  Patent.  Proximal fenestration. Vertebral arteries:  Patent. No evidence of high-grade stenosis, large vessel occlusion, or aneurysm unless noted above. IMPRESSION: 1. No occlusion, aneurysm, dissection, or significant stenosis is identified. Mild motion artifact of the MRA sequence. No gross interval change. 2. No evidence of acute infarct, intracranial hemorrhage, focal mass effect, or hydrocephalus. Stable mild chronic microvascular ischemic changes and parenchymal volume loss. 3. No structural abnormality identified. Electronically Signed   By: Mitzi HansenLance  Furusawa-Stratton M.D.   On: 04/19/2016 02:16   Mr Cervical Spine Wo Contrast  Result Date: 04/20/2016 CLINICAL DATA:  Recurrent spells of unclear etiology with chest pain and left arm pain. History of seizures. EXAM: MRI CERVICAL SPINE WITHOUT CONTRAST TECHNIQUE: Multiplanar, multisequence MR imaging of the cervical spine was performed. No intravenous contrast was administered. COMPARISON:  MRI and MRA brain 04/19/2016. Cervical spine MRI 04/26/2014. FINDINGS: Despite efforts by  the technologist and patient, motion artifact is present on today's exam and could not be eliminated. This reduces exam sensitivity and specificity. Alignment: Stable and near anatomic. Vertebrae: No acute or suspicious osseous findings. Stable endplate degenerative changes at C4-5. Cord: Normal in signal and caliber. Posterior Fossa, vertebral arteries, paraspinal tissues: Visualized portions of the posterior fossa and paraspinal soft tissues appear unremarkable. Bilateral vertebral artery flow voids. Disc levels: C2-3: Mild uncinate spurring. No significant spinal stenosis or nerve root encroachment. C3-4: Stable asymmetric uncinate spurring on the left contributing to mild left foraminal narrowing. No cord deformity. C4-5: The CSF surrounding the cord is partially effaced, due to chronic spondylosis with endplate osteophytes and bilateral uncinate spurring. The AP diameter of the canal is approximately 10 mm. There is mild right and moderate left foraminal narrowing which appears chronic. There is possible left C5 nerve root encroachment. C5-6: Stable mild uncinate spurring and facet hypertrophy. No cord deformity or significant foraminal compromise. C6-7: Stable mild uncinate spurring and facet hypertrophy. No spinal stenosis or nerve root encroachment. C7-T1: No significant findings. IMPRESSION: 1. No acute findings, cord deformity or abnormal cord signal. 2. Generally stable multilevel spondylosis, greatest at C4-5. Endplate osteophytes and uncinate spurring at that level contribute to borderline spinal stenosis and left-greater-than-right foraminal narrowing. There is potential left C5 nerve root encroachment. Electronically Signed   By: Carey BullocksWilliam  Veazey M.D.   On: 04/20/2016 15:28   Nm Myocar Multi W/spect W/wall Motion / Ef  Result Date: 04/20/2016 CLINICAL DATA:  Chest pain.  Ex-smoker.  Prior angioplasty EXAM: MYOCARDIAL IMAGING WITH SPECT (REST AND PHARMACOLOGIC-STRESS) GATED LEFT VENTRICULAR WALL  MOTION STUDY LEFT VENTRICULAR EJECTION FRACTION TECHNIQUE: Standard myocardial SPECT imaging was performed after resting intravenous injection of 10 mCi Tc-463m tetrofosmin. Subsequently, intravenous infusion of Lexiscan was performed under the supervision of the Cardiology staff. At peak effect of the drug, 30 mCi Tc-3463m tetrofosmin was injected intravenously and standard myocardial SPECT imaging was performed. Quantitative gated imaging was also performed to evaluate left ventricular wall motion, and estimate left ventricular ejection fraction. COMPARISON:  CTA of 04/19/2016. FINDINGS: Perfusion: No decreased activity in the left ventricle on stress imaging to suggest reversible ischemia or infarction. Wall Motion: Normal left ventricular wall motion. No left ventricular dilation. Left Ventricular Ejection Fraction: 62 % End diastolic volume 89 ml End systolic volume 34 ml IMPRESSION: 1. No reversible  ischemia or infarction. 2. Normal left ventricular wall motion. 3. Left ventricular ejection fraction 62% 4. Non invasive risk stratification*: Low *2012 Appropriate Use Criteria for Coronary Revascularization Focused Update: J Am Coll Cardiol. 2012;59(9):857-881. http://content.dementiazones.com.aspx?articleid=[email or web address] Electronically Signed   By: Jeronimo Greaves M.D.   On: 04/20/2016 15:21   US Abdomen Limited  Result Date: 05/05/2016 CLINICAL DATA:  65 year old male with a history of right upper quadrant pain EXAM: US ABDOMEN LIMITED - RIGHT UPPER QUADRANT COMPARISON:  Ultrasound 04/21/2016 FINDINGS: Gallbladder: Similar appearance to the prior ultrasound survey, with hyperechoic debris/material at the neck of the gallbladder with posterior acoustic shadowing. Sonographic Eulah Pont sign is reported as negative. Gallbladder wall thickness measures 2 mm without striations or pericholecystic fluid. Common bile duct: Diameter: 7 mm Liver: Focal hypoechoic regions at the hilum of the liver similar to the comparison  ultrasound survey. IMPRESSION: Cholelithiasis without sonographic evidence of acute cholecystitis. Focal hypoechoic regions of the liver parenchyma at the hilum of the liver, likely representing geographic distribution of fatty infiltration. Signed, Yvone Neu. Loreta Ave, DO [facility] Radiology Electronically Signed   By: Gilmer Mor D.O.   On: 05/05/2016 15:26   Dg Chest Port 1 View  Result Date: 04/18/2016 CLINICAL DATA:  66 year old male with central chest pain and shortness of breath for 3 days. Initial encounter. Former smoker. EXAM: PORTABLE CHEST 1 VIEW COMPARISON:  Chest radiographs 03/07/2014 and earlier. FINDINGS: Portable AP semi upright view at at 1306 hours. Mediastinal contours remain normal. Visualized tracheal air column is within normal limits. Mild increased interstitial markings have not significantly changed. No pneumothorax, pulmonary edema, pleural effusion or confluent pulmonary opacity. Multiple EKG leads and wires overlie the chest. Negative visible bowel gas pattern. IMPRESSION: No acute cardiopulmonary abnormality. Electronically Signed   By: Odessa Fleming M.D.   On: 04/18/2016 13:20   Ct Angio Chest/abd/pel For Dissection W And/or W/wo  Result Date: 04/19/2016 CLINICAL DATA:  65 year old with midline chest pain. EXAM: CT ANGIOGRAPHY CHEST, ABDOMEN AND PELVIS TECHNIQUE: Multidetector CT imaging through the chest, abdomen and pelvis was performed using the standard protocol during bolus administration of intravenous contrast. Multiplanar reconstructed images and MIPs were obtained and reviewed to evaluate the vascular anatomy. CONTRAST:  100 mL Isovue 370 COMPARISON:  03/07/2014 FINDINGS: CTA CHEST FINDINGS Vascular structures: Coronary artery calcifications. Normal caliber of the thoracic aorta without intramural hematoma or dissection. Ascending thoracic aorta measures up to 3.4 cm. 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 The right subclavian artery extends posterior to the esophagus and there is no aneurysm at the origin. Pulmonary arteries are patent and no evidence for pulmonary embolism. Mediastinum: There is no significant chest lymphadenopathy. Again noted are surgical changes at the GE junction and proximal stomach. Mild fullness of the distal esophagus is unchanged. Lungs/pleural: Trachea and mainstem bronchi are patent. There are some blebs at the lung apices, particularly on the right side. Lungs are essentially clear. There are densities in the lower lobes that probably represent atelectasis. No large pleural effusions. No significant airspace disease or consolidation in the lungs. Musculoskeletal: Degenerative changes in thoracic spine. No acute bone abnormality. Review of the MIP images confirms the above findings. CTA ABDOMEN AND PELVIS FINDINGS Vascular: Normal caliber of the abdominal aorta without aneurysm. Normal caliber of the internal iliac arteries without aneurysm. Atherosclerotic disease in the abdominal aorta. Variant celiac trunk anatomy. The common hepatic artery originates directly from the aorta. The left gastric artery may have been  ligated from previous gastric surgery. There is a prominent right gastric artery. Splenic artery is patent. Mild narrowing at the origin of the SMA. This SMA narrowing could be related to external compression or noncalcified plaque. This is probably not hemodynamically significant stenosis. Inferior mesenteric artery is patent. Bilateral renal arteries are patent. Diffuse atherosclerotic disease in the iliac arteries. Severe stenosis at the origin of the right SFA. Stenosis at the origin of the left SFA. There is a stent in the proximal left SFA which has some intra stent stenosis. Profunda femoral arteries are patent bilaterally. Liver/biliary: Gallbladder is moderately distended with multiple gallstones. No gross abnormality to the liver. No inflammatory  changes around the gallbladder. Pancreas: Normal appearance of the pancreas without inflammation or duct dilatation. Spleen: Normal appearance of spleen without enlargement. Adrenal/urinary tract: Normal adrenal glands. Normal appearance of both kidneys and bilateral adrenal glands. No hydronephrosis. Normal appearance of the urinary bladder with mild distention. 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 There is no significant bowel dilatation and no evidence for obstruction. Lymphatics:  No suspicious lymphadenopathy. Reproductive: No gross abnormality to the prostate or seminal vesicles. Other: No free fluid in the pelvis. No free air. Fat containing inguinal hernias. Musculoskeletal:  No acute abnormality. Review of the MIP images confirms the above findings. IMPRESSION: Negative for an aortic dissection or aneurysm. Cholelithiasis. Postsurgical changes in stomach. 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 Variant vascular anatomy. 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 Variant celiac trunk anatomy as described. Atherosclerotic disease with stenosis in the bilateral superficial femoral arteries as described. Electronically Signed   By: Richarda Overlie M.D.   On: 04/19/2016 14:31   US Abdomen Limited Ruq  Result Date: 04/21/2016 CLINICAL DATA:  Cholelithiasis. Right upper quadrant pain for 2 days. EXAM: US ABDOMEN LIMITED - RIGHT UPPER QUADRANT COMPARISON:  Plain films of 04/19/2016 and CT of 03/07/2014. FINDINGS: Gallbladder: Multiple gallstones including at up to 1.6 cm. No wall thickening or pericholecystic fluid. Sonographic Murphy's sign was not elicited. Common bile duct: Diameter: Normal, 6 mm. Liver: Increased hepatic echogenicity is consistent with steatosis. Areas of relative hypo echogenicity including adjacent the gallbladder which are likely related to is sparing. Some areas, including adjacent the  portal vein on image 16, are somewhat masslike. IMPRESSION: 1. Cholelithiasis without acute cholecystitis or biliary duct dilatation. 2. Hepatic steatosis. 3. Probable sparing of steatosis throughout the liver. As some of these areas are relatively masslike, consider follow-up with right upper quadrant ultrasound in 1-2 months to confirm stability. A more aggressive approach, especially if the patient has a history of liver disease or primary malignancy, includes pre and post contrast abdominal MRI. This should be performed when the patient is able to breath hold and follow directions (as an outpatient). Electronically Signed   By: Jeronimo Greaves M.D.   On: 04/21/2016 09:24    ECG  NSR. No ischemia.     ASSESSMENT AND PLAN  65 y/o male with known CAD s/p DES to 2nd Mrg and LCx in 2009, cath in 03/2014 showing patent stent in LCx. He recently underwent NST 04/20/16 which was negative for ischemia. 2D echo showed normal LVEF. EKG this admit shows NSR w/o ischemia. He denies any anginal symptomatolgy. He is able to walk a flight of stairs without cardiac symptoms. He is cleared for surgery for symptomatic cholelithiasis. Low risk for cardiac complications. Ok to hold Plavix during the perioperative period. We will follow along  post op.   Signed, Robbie Lis, PA-C 05/06/2016, 11:20 AM  The patient was seen and examined, and I agree with the history, physical exam, assessment and plan as documented above which has been discussed with B. Simmons, with modifications as noted below. Pt admitted with symptomatic cholelithiasis with planned cholecystectomy. Has CAD with prior PCI and recent nuclear MPI study which did not demonstrate ischemia. Echo showed normal LVEF. He is without exertional cardiac symptoms and can climb a flight of stairs without difficulty. Plavix currently on hold. Can proceed with planned surgery without further cardiac testing.  Prentice Docker, MD, [facility]  05/06/2016 11:59  AM

## 2016-05-06 NOTE — Progress Notes (Signed)
Pharmacy Antibiotic Note  Justin Wells is a 65 y.o. male admitted on 05/05/2016 with empiric for acute cholecystitis/intra-abdominal .  Pharmacy has been consulted for Zosyn dosing.  Plan: Zosyn 3.375g IV q8h (4 hour infusion). Monitor renal function, cultures if any, and US/scans Follow-up surgical plan and LOT  Height: 5\' 10"  (177.8 cm) Weight: 209 lb (94.8 kg) IBW/kg (Calculated) : 73  Temp (24hrs), Avg:97.7 F (36.5 C), Min:97.5 F (36.4 C), Max:98 F (36.7 C)   Recent Labs Lab 05/05/16 1250 05/06/16 0301  WBC 7.1 6.2  CREATININE 0.78 1.00    Estimated Creatinine Clearance: 87.4 mL/min (by C-G formula based on SCr of 1 mg/dL).    No Known Allergies  Antimicrobials this admission: Zosyn 9/4 >>  Dose adjustments this admission: none  Microbiology results: none  Thank you for allowing pharmacy to be a part of this patient's care.  Link SnufferJessica Perl Folmar, PharmD, BCPS Clinical Pharmacist [phone] 05/06/2016 10:54 AM

## 2016-05-07 LAB — CBC WITH DIFFERENTIAL/PLATELET
BASOS ABS: 0.1 10*3/uL (ref 0.0–0.1)
Basophils Relative: 1 %
Eosinophils Absolute: 0.4 10*3/uL (ref 0.0–0.7)
Eosinophils Relative: 4 %
HEMATOCRIT: 44.6 % (ref 39.0–52.0)
Hemoglobin: 14.6 g/dL (ref 13.0–17.0)
LYMPHS ABS: 1.4 10*3/uL (ref 0.7–4.0)
LYMPHS PCT: 15 %
MCH: 28.9 pg (ref 26.0–34.0)
MCHC: 32.7 g/dL (ref 30.0–36.0)
MCV: 88.1 fL (ref 78.0–100.0)
MONO ABS: 0.8 10*3/uL (ref 0.1–1.0)
Monocytes Relative: 8 %
NEUTROS ABS: 6.6 10*3/uL (ref 1.7–7.7)
Neutrophils Relative %: 72 %
Platelets: 186 10*3/uL (ref 150–400)
RBC: 5.06 MIL/uL (ref 4.22–5.81)
RDW: 13.6 % (ref 11.5–15.5)
WBC: 9.2 10*3/uL (ref 4.0–10.5)

## 2016-05-07 LAB — COMPREHENSIVE METABOLIC PANEL
ALT: 20 U/L (ref 17–63)
AST: 18 U/L (ref 15–41)
Albumin: 3.7 g/dL (ref 3.5–5.0)
Alkaline Phosphatase: 72 U/L (ref 38–126)
Anion gap: 7 (ref 5–15)
BILIRUBIN TOTAL: 1 mg/dL (ref 0.3–1.2)
CO2: 32 mmol/L (ref 22–32)
CREATININE: 1.14 mg/dL (ref 0.61–1.24)
Calcium: 9.4 mg/dL (ref 8.9–10.3)
Chloride: 101 mmol/L (ref 101–111)
GFR calc Af Amer: >60 mL/min (ref >60)
Glucose, Bld: 109 mg/dL — ABNORMAL HIGH (ref 65–99)
Potassium: 4.1 mmol/L (ref 3.5–5.1)
Sodium: 140 mmol/L (ref 135–145)
TOTAL PROTEIN: 6.3 g/dL — AB (ref 6.5–8.1)

## 2016-05-07 NOTE — Progress Notes (Addendum)
Justin Wells ZOX:[id number] DOB: 10-16-1950 DOA: 05/05/2016 PCP: Carmin Richmond, MD  Brief narrative:  65 y/o ? CAd s/p stent 2009-patent stents noted on cath 03/2014. Nuc stress 04/2016 was neg CVA Htn Seizure disorder Hypertension Hyperlipidemia Chronic pain syndrome  Recently admitted 8/17-8/22,017 with seizures felt to be consistent with focal seizures more so than TIA versus hypoglycemia. He had an EEG MRI of the brain and was discharged with higher dose of Keppra 750 twice a day During that admission as well he had some chest pain. He was worked up for this.  States that he had some epigastric tenderness which got worse while he was discharged and came back to the hospital on 05/05/2016 and was found to have cholecystitis  Cardiology cleared for surgery which will be on 05/09/16  Past medical history-As per Problem list Chart reviewed as below-   Consultants:  gen sur  Procedures:  Hida pedning  Antibiotics:  Zosyn 9/4   Subjective   Fair Pain am little more menageable No cp Some flatus today No n/v   Objective    Interim History:   Telemetry:    Objective: Vitals:   05/06/16 0617 05/06/16 1458 05/06/16 2124 05/07/16 0507  BP: 136/72 122/60 112/74 116/63  Pulse: (!) 55 75 (!) 59 (!) 54  Resp: 17 16 17 17   Temp: 97.5 F (36.4 C) 97.7 F (36.5 C) 98.5 F (36.9 C) 97.6 F (36.4 C)  TempSrc: Oral Oral Oral Oral  SpO2: 95% 98% 96% 98%  Weight:      Height:        Intake/Output Summary (Last 24 hours) at 05/07/16 1055 Last data filed at 05/07/16 0847  Gross per 24 hour  Intake             1400 ml  Output             2825 ml  Net            -1425 ml    Exam:  Alert oriented pleasant, no pallor no icterus no JVD S1-S2 no murmur rub or gallop chest clinically clear regular rate rhythm no added sounds Abdomen obese nontender nondistended epigastric tenderness present, bowel sounds are not heard slightly bloated no rebound no guarding No  lower extremity edema Neurologically intact   Data Reviewed: Basic Metabolic Panel:  Recent Labs Lab 05/05/16 1250 05/06/16 0301 05/07/16 0516  NA 134* 138 140  K 3.8 4.1 4.1  CL 100* 104 101  CO2 27 30 32  GLUCOSE 104* 125* 109*  BUN <5* 5* <5*  CREATININE 0.78 1.00 1.14  CALCIUM 9.1 9.0 9.4   Liver Function Tests:  Recent Labs Lab 05/05/16 1405 05/07/16 0516  AST 21 18  ALT 19 20  ALKPHOS 74 72  BILITOT 0.6 1.0  PROT 6.8 6.3*  ALBUMIN 4.0 3.7    Recent Labs Lab 05/05/16 1405  LIPASE 38   No results for input(s): AMMONIA in the last 168 hours. CBC:  Recent Labs Lab 05/05/16 1250 05/06/16 0301 05/07/16 0516  WBC 7.1 6.2 9.2  NEUTROABS  --   --  6.6  HGB 14.1 13.1 14.6  HCT 42.1 40.6 44.6  MCV 87.0 88.1 88.1  PLT 199 167 186   Cardiac Enzymes: No results for input(s): CKTOTAL, CKMB, CKMBINDEX, TROPONINI in the last 168 hours. BNP: Invalid input(s): POCBNP CBG: No results for input(s): GLUCAP in the last 168 hours.  No results found for this or any previous visit (from the  past 240 hour(s)).   Studies:              All Imaging reviewed and is as per above notation   Scheduled Meds: . aspirin EC  81 mg Oral Daily  . levETIRAcetam  750 mg Oral BID  . lisinopril  5 mg Oral Daily  . morphine  15 mg Oral BID  . pantoprazole  40 mg Oral Daily  . PARoxetine  20 mg Oral Daily  . piperacillin-tazobactam (ZOSYN)  IV  3.375 g Intravenous Q8H  . senna  1 tablet Oral BID  . sodium chloride flush  3 mL Intravenous Q12H   Continuous Infusions:    Assessment/Plan:  1. Acute cholecystitis, appreciate general surgery input, patient has been taken off Plavix as of 05/05/16, [facility]-for Lap chole Thursday 05/10/15.  Continue Zosyn. 2. Focal seizure history-continue Keppra 750 by mouth twice a day, monitor for seizure activity 3. CAD, recent stress testing last admission 04/2016 within normal  limits-continue aspirin alone 81 mg, question whether after being out from stenting for this long would warrant further Plavix, continue lisinopril 5 daily-note that patient is not on a beta blocker at this time 4. Reflux disease- continue pantoprazole 40 daily 5. Bipolar continue Paxil 20 daily, lorazepam 0.5 every 8 hourly,  6. chronic pain continue MS Contin 15 twice a day, cont oxycodone 5 every 6 when necessary breakthrough-pain medications should be managed as an outpatient by his primary physician/operating surgeon 7. Hyperlipidemia-resume statin on discharge   inpatient status at present time  Appreciate general surgery Repeat labs a.m. No family + today     Pleas KochJai Ignazio Kincaid, MD  [facility] Pager [phone] 05/07/2016, 10:55 AM    LOS: 2 days

## 2016-05-07 NOTE — Progress Notes (Signed)
Patient ID: Justin Wells, male   DOB: 1951-05-30, 65 y.o.   MRN: [id number]  [facility] Surgery Progress Note     Subjective: Continued RUQ and epigastric pain radiating to his back. Worse with deep inspiration. No recent emesis. No BM +flatus. Tolerating clear liquids. Cleared for surgery by cardiology yesterday. HIDA scan normal. WBC, LFT's normal.  Objective: Vital signs in last 24 hours: Temp:  [97.6 F (36.4 C)-98.5 F (36.9 C)] 97.6 F (36.4 C) (09/05 0507) Pulse Rate:  [54-75] 54 (09/05 0507) Resp:  [16-17] 17 (09/05 0507) BP: (112-122)/(60-74) 116/63 (09/05 0507) SpO2:  [96 %-98 %] 98 % (09/05 0507) Last BM Date: 05/05/16  Intake/Output from previous day: 09/04 0701 - 09/05 0700 In: 1160 [P.O.:1040; I.V.:20; IV Piggyback:100] Out: 2850 [Urine:2850] Intake/Output this shift: Total I/O In: 240 [P.O.:240] Out: 375 [Urine:375]  PE: Gen:  Alert, NAD, pleasant Card:  RRR Pulm:  CTAB Abd: Soft, ND, +BS, tender RUQ and epigastrium worse with inspiration   Lab Results:   Recent Labs  05/06/16 0301 05/07/16 0516  WBC 6.2 9.2  HGB 13.1 14.6  HCT 40.6 44.6  PLT 167 186   BMET  Recent Labs  05/06/16 0301 05/07/16 0516  NA 138 140  K 4.1 4.1  CL 104 101  CO2 30 32  GLUCOSE 125* 109*  BUN 5* <5*  CREATININE 1.00 1.14  CALCIUM 9.0 9.4   PT/INR No results for input(s): LABPROT, INR in the last 72 hours. CMP     Component Value Date/Time   NA 140 05/07/2016 0516   K 4.1 05/07/2016 0516   CL 101 05/07/2016 0516   CO2 32 05/07/2016 0516   GLUCOSE 109 (H) 05/07/2016 0516   BUN <5 (L) 05/07/2016 0516   CREATININE 1.14 05/07/2016 0516   CALCIUM 9.4 05/07/2016 0516   PROT 6.3 (L) 05/07/2016 0516   ALBUMIN 3.7 05/07/2016 0516   AST 18 05/07/2016 0516   ALT 20 05/07/2016 0516   ALKPHOS 72 05/07/2016 0516   BILITOT 1.0 05/07/2016 0516   GFRNONAA >60 05/07/2016 0516   GFRAA >60 05/07/2016 0516   Lipase     Component Value Date/Time   LIPASE  38 05/05/2016 1405       Studies/Results: Dg Chest 2 View  Result Date: 05/05/2016 CLINICAL DATA:  65 year old male with a history of chest pain EXAM: CHEST  2 VIEW COMPARISON:  04/18/2016, CT chest 04/19/2016 FINDINGS: Cardiomediastinal silhouette within normal limits. No central vascular congestion. No confluent airspace disease or pneumothorax.  No pleural effusion. Coronary artery stents. Chronic coarsening of interstitial markings. No displaced fracture.  Degenerative changes of the spine. IMPRESSION: Chronic lung changes without evidence of superimposed acute cardiopulmonary disease. Evidence of prior coronary artery stenting/CAD. Signed, Yvone Neu. Loreta Ave, DO [facility] Radiology Electronically Signed   By: Gilmer Mor D.O.   On: 05/05/2016 12:14   Nm Hepatobiliary Including Gb  Result Date: 05/06/2016 CLINICAL DATA:  Right upper quadrant abdominal pain common nausea and vomiting for several weeks. EXAM: NUCLEAR MEDICINE HEPATOBILIARY IMAGING TECHNIQUE: Sequential images of the abdomen were obtained out to 60 minutes following intravenous administration of radiopharmaceutical. RADIOPHARMACEUTICALS:  4.9 mCi Tc-46m  Choletec IV COMPARISON:  Ultrasound 05/05/2016 FINDINGS: There is symmetric uptake in the liver and prompt excretion into the biliary tree which is visualized at 5 minutes. Activity is seen in the duodenum at 10 minutes. The gallbladder is visualized at 25 minutes. IMPRESSION: Normal biliary patency study. Electronically Signed  By: Rudie MeyerP.  Gallerani M.D.   On: 05/06/2016 14:09   [place] Abdomen Limited  Result Date: 05/05/2016 CLINICAL DATA:  65 year old male with a history of right upper quadrant pain EXAM: US ABDOMEN LIMITED - RIGHT UPPER QUADRANT COMPARISON:  Ultrasound 04/21/2016 FINDINGS: Gallbladder: Similar appearance to the prior ultrasound survey, with hyperechoic debris/material at the neck of the gallbladder with posterior acoustic  shadowing. Sonographic Eulah PontMurphy sign is reported as negative. Gallbladder wall thickness measures 2 mm without striations or pericholecystic fluid. Common bile duct: Diameter: 7 mm Liver: Focal hypoechoic regions at the hilum of the liver similar to the comparison ultrasound survey. IMPRESSION: Cholelithiasis without sonographic evidence of acute cholecystitis. Focal hypoechoic regions of the liver parenchyma at the hilum of the liver, likely representing geographic distribution of fatty infiltration. Signed, Yvone NeuJaime S. Loreta AveWagner, DO [facility] Electronically Signed   By: Gilmer MorJaime  Wagner D.O.   On: 05/05/2016 15:26    Anti-infectives: Anti-infectives    Start     Dose/Rate Route Frequency Ordered Stop   05/06/16 1100  piperacillin-tazobactam (ZOSYN) IVPB 3.375 g     3.375 g 12.5 mL/hr over 240 Minutes Intravenous Every 8 hours 05/06/16 1056         Assessment/Plan Symptomatic gallstones.   - u/s showed gallstones - LFT's normal, WBC normal - HIDA normal - tender RUQ and epigastrium - continue empiric antibiotics - hold Plavix - NPO after midnight - cholecystectomy planned for tomorrow CAD (s/p DES to 2nd Mrg and LCx in 2009, cath in 03/2014 showing patent stent in Lcx) - last dose Plavix was Sunday CVA in 2009 in setting of cardiac cath PVD (s/p L SFA stenting in 2014) HTN HLD Seizure disorder - on keppra  ID - Zosyn day 2 VTE - SCD's FEN - clears, NPO after midnight  Plan - cholecystectomy planned for tomorrow. Will ensure no further workup needed (?CT) with MD.    LOS: 2 days    Edson SnowballBROOKE A MILLER , [facility] 05/07/2016, 9:18 AM Pager: [phone] Consults: [phone] Mon-Fri 7:00 am-4:30 pm Sat-Sun 7:00 am-11:30 am

## 2016-05-08 ENCOUNTER — Encounter (HOSPITAL_COMMUNITY): Payer: Self-pay | Admitting: Certified Registered Nurse Anesthetist

## 2016-05-08 ENCOUNTER — Inpatient Hospital Stay (HOSPITAL_COMMUNITY): Payer: Medicare HMO | Admitting: Certified Registered Nurse Anesthetist

## 2016-05-08 ENCOUNTER — Inpatient Hospital Stay (HOSPITAL_COMMUNITY): Payer: Medicare HMO

## 2016-05-08 ENCOUNTER — Encounter (HOSPITAL_COMMUNITY)
Admission: EM | Disposition: A | Discharge status: Home / Self Care | Payer: Self-pay | Source: Home / Self Care | Attending: Family Medicine

## 2016-05-08 DIAGNOSIS — K8020 Calculus of gallbladder without cholecystitis without obstruction: Secondary | ICD-10-CM

## 2016-05-08 DIAGNOSIS — G40109 Localization-related (focal) (partial) symptomatic epilepsy and epileptic syndromes with simple partial seizures, not intractable, without status epilepticus: Secondary | ICD-10-CM

## 2016-05-08 HISTORY — PX: CHOLECYSTECTOMY: SHX55

## 2016-05-08 LAB — CBC WITH DIFFERENTIAL/PLATELET
BASOS ABS: 0.1 10*3/uL (ref 0.0–0.1)
BASOS PCT: 1 %
Eosinophils Absolute: 0.4 10*3/uL (ref 0.0–0.7)
Eosinophils Relative: 5 %
HCT: 41.9 % (ref 39.0–52.0)
Hemoglobin: 13.8 g/dL (ref 13.0–17.0)
LYMPHS PCT: 21 %
Lymphs Abs: 1.8 10*3/uL (ref 0.7–4.0)
MCH: 29 pg (ref 26.0–34.0)
MCHC: 32.9 g/dL (ref 30.0–36.0)
MCV: 88 fL (ref 78.0–100.0)
Monocytes Absolute: 0.8 10*3/uL (ref 0.1–1.0)
Monocytes Relative: 9 %
NEUTROS ABS: 5.4 10*3/uL (ref 1.7–7.7)
Neutrophils Relative %: 64 %
PLATELETS: 196 10*3/uL (ref 150–400)
RBC: 4.76 MIL/uL (ref 4.22–5.81)
RDW: 13.6 % (ref 11.5–15.5)
WBC: 8.5 10*3/uL (ref 4.0–10.5)

## 2016-05-08 LAB — COMPREHENSIVE METABOLIC PANEL
ALBUMIN: 3.6 g/dL (ref 3.5–5.0)
ALT: 17 U/L (ref 17–63)
AST: 18 U/L (ref 15–41)
Alkaline Phosphatase: 61 U/L (ref 38–126)
Anion gap: 12 (ref 5–15)
CHLORIDE: 97 mmol/L — AB (ref 101–111)
CO2: 29 mmol/L (ref 22–32)
CREATININE: 1.2 mg/dL (ref 0.61–1.24)
Calcium: 8.8 mg/dL — ABNORMAL LOW (ref 8.9–10.3)
GFR calc Af Amer: >60 mL/min (ref >60)
GLUCOSE: 100 mg/dL — AB (ref 65–99)
Potassium: 4.3 mmol/L (ref 3.5–5.1)
Sodium: 138 mmol/L (ref 135–145)
Total Bilirubin: 0.7 mg/dL (ref 0.3–1.2)
Total Protein: 6 g/dL — ABNORMAL LOW (ref 6.5–8.1)

## 2016-05-08 LAB — PROTIME-INR
INR: 1.1
PROTHROMBIN TIME: 14.3 s (ref 11.4–15.2)

## 2016-05-08 LAB — SURGICAL PCR SCREEN
MRSA, PCR: NEGATIVE
Staphylococcus aureus: POSITIVE — AB

## 2016-05-08 SURGERY — LAPAROSCOPIC CHOLECYSTECTOMY WITH INTRAOPERATIVE CHOLANGIOGRAM
Anesthesia: General | Site: Abdomen

## 2016-05-08 MED ORDER — SUGAMMADEX SODIUM 200 MG/2ML IV SOLN
INTRAVENOUS | Status: DC | PRN
  Administered 2016-05-08: 150 mg via INTRAVENOUS

## 2016-05-08 MED ORDER — LABETALOL HCL 5 MG/ML IV SOLN
INTRAVENOUS | Status: AC
  Filled 2016-05-08: qty 4

## 2016-05-08 MED ORDER — FENTANYL CITRATE (PF) 100 MCG/2ML IJ SOLN
INTRAMUSCULAR | Status: AC
  Filled 2016-05-08: qty 4

## 2016-05-08 MED ORDER — OXYCODONE HCL 5 MG PO TABS
ORAL_TABLET | ORAL | Status: AC
  Filled 2016-05-08: qty 1

## 2016-05-08 MED ORDER — ACETAMINOPHEN 10 MG/ML IV SOLN
1000.0000 mg | Freq: Once | INTRAVENOUS | Status: AC
  Administered 2016-05-08: 1000 mg via INTRAVENOUS

## 2016-05-08 MED ORDER — KETOROLAC TROMETHAMINE 30 MG/ML IJ SOLN
INTRAMUSCULAR | Status: AC
  Filled 2016-05-08: qty 1

## 2016-05-08 MED ORDER — ROCURONIUM BROMIDE 100 MG/10ML IV SOLN
INTRAVENOUS | Status: DC | PRN
  Administered 2016-05-08: 60 mg via INTRAVENOUS

## 2016-05-08 MED ORDER — HEMOSTATIC AGENTS (NO CHARGE) OPTIME
TOPICAL | Status: DC | PRN
  Administered 2016-05-08: 1 via TOPICAL

## 2016-05-08 MED ORDER — PROMETHAZINE HCL 25 MG/ML IJ SOLN: 6.2500 mg | INTRAMUSCULAR | Status: DC | PRN

## 2016-05-08 MED ORDER — LACTATED RINGERS IV SOLN
INTRAVENOUS | Status: DC
  Administered 2016-05-08: 19:00:00 via INTRAVENOUS

## 2016-05-08 MED ORDER — METOPROLOL TARTRATE 5 MG/5ML IV SOLN
INTRAVENOUS | Status: DC | PRN
  Administered 2016-05-08 (×2): 2.5 mg via INTRAVENOUS

## 2016-05-08 MED ORDER — MIDAZOLAM HCL 2 MG/2ML IJ SOLN
INTRAMUSCULAR | Status: AC
  Filled 2016-05-08: qty 2

## 2016-05-08 MED ORDER — SODIUM CHLORIDE 0.9 % IR SOLN
Status: DC | PRN
  Administered 2016-05-08: 1000 mL

## 2016-05-08 MED ORDER — LABETALOL HCL 5 MG/ML IV SOLN
INTRAVENOUS | Status: DC | PRN
  Administered 2016-05-08: 10 mg via INTRAVENOUS

## 2016-05-08 MED ORDER — IOPAMIDOL (ISOVUE-300) INJECTION 61%
INTRAVENOUS | Status: AC
  Filled 2016-05-08: qty 50

## 2016-05-08 MED ORDER — MIDAZOLAM HCL 2 MG/2ML IJ SOLN
INTRAMUSCULAR | Status: DC | PRN
  Administered 2016-05-08: 2 mg via INTRAVENOUS

## 2016-05-08 MED ORDER — MEPERIDINE HCL 25 MG/ML IJ SOLN: 6.2500 mg | INTRAMUSCULAR | Status: DC | PRN

## 2016-05-08 MED ORDER — MUPIROCIN 2 % EX OINT
1.0000 "application " | TOPICAL_OINTMENT | Freq: Two times a day (BID) | CUTANEOUS | Status: DC
  Administered 2016-05-08 – 2016-05-09 (×4): 1 via NASAL
  Filled 2016-05-08: qty 22

## 2016-05-08 MED ORDER — KETOROLAC TROMETHAMINE 30 MG/ML IJ SOLN
30.0000 mg | Freq: Once | INTRAMUSCULAR | Status: AC
  Administered 2016-05-08: 30 mg via INTRAVENOUS

## 2016-05-08 MED ORDER — SODIUM CHLORIDE 0.9 % IV SOLN
INTRAVENOUS | Status: DC | PRN
  Administered 2016-05-08: 17 mL

## 2016-05-08 MED ORDER — BUPIVACAINE-EPINEPHRINE (PF) 0.25% -1:200000 IJ SOLN
INTRAMUSCULAR | Status: AC
  Filled 2016-05-08: qty 30

## 2016-05-08 MED ORDER — FENTANYL CITRATE (PF) 100 MCG/2ML IJ SOLN
INTRAMUSCULAR | Status: AC
  Filled 2016-05-08: qty 2

## 2016-05-08 MED ORDER — PROPOFOL 10 MG/ML IV BOLUS
INTRAVENOUS | Status: DC | PRN
  Administered 2016-05-08: 160 mg via INTRAVENOUS

## 2016-05-08 MED ORDER — LACTATED RINGERS IV SOLN
INTRAVENOUS | Status: DC
Start: 2016-05-08 — End: 2016-05-09
  Administered 2016-05-08: 14:00:00 via INTRAVENOUS

## 2016-05-08 MED ORDER — ONDANSETRON HCL 4 MG/2ML IJ SOLN
INTRAMUSCULAR | Status: DC | PRN
  Administered 2016-05-08: 4 mg via INTRAVENOUS

## 2016-05-08 MED ORDER — SUGAMMADEX SODIUM 200 MG/2ML IV SOLN
INTRAVENOUS | Status: AC
  Filled 2016-05-08: qty 2

## 2016-05-08 MED ORDER — ACETAMINOPHEN 10 MG/ML IV SOLN
INTRAVENOUS | Status: AC
  Filled 2016-05-08: qty 100

## 2016-05-08 MED ORDER — LIDOCAINE HCL (CARDIAC) 20 MG/ML IV SOLN
INTRAVENOUS | Status: DC | PRN
  Administered 2016-05-08: 40 mg via INTRAVENOUS

## 2016-05-08 MED ORDER — HYDROMORPHONE HCL 1 MG/ML IJ SOLN
INTRAMUSCULAR | Status: AC
  Filled 2016-05-08: qty 1

## 2016-05-08 MED ORDER — BUPIVACAINE-EPINEPHRINE 0.25% -1:200000 IJ SOLN
INTRAMUSCULAR | Status: DC | PRN
  Administered 2016-05-08: 8 mL

## 2016-05-08 MED ORDER — CHLORHEXIDINE GLUCONATE CLOTH 2 % EX PADS
6.0000 | MEDICATED_PAD | Freq: Every day | CUTANEOUS | Status: DC
  Administered 2016-05-08 – 2016-05-09 (×2): 6 via TOPICAL

## 2016-05-08 MED ORDER — FENTANYL CITRATE (PF) 100 MCG/2ML IJ SOLN
INTRAMUSCULAR | Status: DC | PRN
  Administered 2016-05-08 (×2): 100 ug via INTRAVENOUS
  Administered 2016-05-08 (×2): 50 ug via INTRAVENOUS
  Administered 2016-05-08: 100 ug via INTRAVENOUS

## 2016-05-08 MED ORDER — 0.9 % SODIUM CHLORIDE (POUR BTL) OPTIME
TOPICAL | Status: DC | PRN
  Administered 2016-05-08: 1000 mL

## 2016-05-08 MED ORDER — HYDROMORPHONE HCL 1 MG/ML IJ SOLN
0.2500 mg | INTRAMUSCULAR | Status: DC | PRN
  Administered 2016-05-08 (×4): 0.5 mg via INTRAVENOUS

## 2016-05-08 SURGICAL SUPPLY — 53 items
APPLIER CLIP ROT 10 11.4 M/L (STAPLE) ×2
BENZOIN TINCTURE PRP APPL 2/3 (GAUZE/BANDAGES/DRESSINGS) ×2 IMPLANT
BLADE SURG ROTATE 9660 (MISCELLANEOUS) ×2 IMPLANT
CANISTER SUCTION 2500CC (MISCELLANEOUS) ×2 IMPLANT
CHLORAPREP W/TINT 26ML (MISCELLANEOUS) ×2 IMPLANT
CLIP APPLIE ROT 10 11.4 M/L (STAPLE) ×1 IMPLANT
COVER MAYO STAND STRL (DRAPES) ×2 IMPLANT
COVER SURGICAL LIGHT HANDLE (MISCELLANEOUS) ×2 IMPLANT
DRAPE C-ARM 42X72 X-RAY (DRAPES) ×2 IMPLANT
DRSG TEGADERM 2-3/8X2-3/4 SM (GAUZE/BANDAGES/DRESSINGS) ×6 IMPLANT
DRSG TEGADERM 4X4.75 (GAUZE/BANDAGES/DRESSINGS) ×2 IMPLANT
ELECT REM PT RETURN 9FT ADLT (ELECTROSURGICAL) ×2
ELECTRODE REM PT RTRN 9FT ADLT (ELECTROSURGICAL) ×1 IMPLANT
FILTER SMOKE EVAC LAPAROSHD (FILTER) ×2 IMPLANT
GAUZE SPONGE 2X2 8PLY STRL LF (GAUZE/BANDAGES/DRESSINGS) ×1 IMPLANT
GLOVE BIO SURGEON STRL SZ 6.5 (GLOVE) ×2 IMPLANT
GLOVE BIO SURGEON STRL SZ7 (GLOVE) ×2 IMPLANT
GLOVE BIOGEL PI IND STRL 6.5 (GLOVE) ×1 IMPLANT
GLOVE BIOGEL PI IND STRL 7.0 (GLOVE) ×2 IMPLANT
GLOVE BIOGEL PI IND STRL 7.5 (GLOVE) ×2 IMPLANT
GLOVE BIOGEL PI INDICATOR 6.5 (GLOVE) ×1
GLOVE BIOGEL PI INDICATOR 7.0 (GLOVE) ×2
GLOVE BIOGEL PI INDICATOR 7.5 (GLOVE) ×2
GLOVE SURG SS PI 6.5 STRL IVOR (GLOVE) ×4 IMPLANT
GOWN STRL REUS W/ TWL LRG LVL3 (GOWN DISPOSABLE) ×3 IMPLANT
GOWN STRL REUS W/TWL LRG LVL3 (GOWN DISPOSABLE) ×3
HEMOSTAT SNOW SURGICEL 2X4 (HEMOSTASIS) ×2 IMPLANT
KIT BASIN OR (CUSTOM PROCEDURE TRAY) ×2 IMPLANT
KIT ROOM TURNOVER OR (KITS) ×2 IMPLANT
NS IRRIG 1000ML POUR BTL (IV SOLUTION) ×2 IMPLANT
PAD ARMBOARD 7.5X6 YLW CONV (MISCELLANEOUS) ×2 IMPLANT
PENCIL BUTTON HOLSTER BLD 10FT (ELECTRODE) ×2 IMPLANT
POUCH RETRIEVAL ECOSAC 10 (ENDOMECHANICALS) ×1 IMPLANT
POUCH RETRIEVAL ECOSAC 10MM (ENDOMECHANICALS) ×1
POUCH SPECIMEN RETRIEVAL 10MM (ENDOMECHANICALS) ×2 IMPLANT
SCISSORS LAP 5X35 DISP (ENDOMECHANICALS) ×2 IMPLANT
SET CHOLANGIOGRAPH 5 50 .035 (SET/KITS/TRAYS/PACK) ×2 IMPLANT
SET IRRIG TUBING LAPAROSCOPIC (IRRIGATION / IRRIGATOR) ×2 IMPLANT
SLEEVE ENDOPATH XCEL 5M (ENDOMECHANICALS) ×2 IMPLANT
SPECIMEN JAR SMALL (MISCELLANEOUS) ×2 IMPLANT
SPONGE GAUZE 2X2 STER 10/PKG (GAUZE/BANDAGES/DRESSINGS) ×1
STRIP CLOSURE SKIN 1/2X4 (GAUZE/BANDAGES/DRESSINGS) ×2 IMPLANT
SUT MNCRL AB 4-0 PS2 18 (SUTURE) ×2 IMPLANT
SUT PDS AB 0 CT 36 (SUTURE) ×2 IMPLANT
SUT SILK 2 0 SH (SUTURE) ×2 IMPLANT
SUT VICRYL 0 UR6 27IN ABS (SUTURE) ×2 IMPLANT
TOWEL OR 17X24 6PK STRL BLUE (TOWEL DISPOSABLE) ×2 IMPLANT
TOWEL OR 17X26 10 PK STRL BLUE (TOWEL DISPOSABLE) ×2 IMPLANT
TRAY LAPAROSCOPIC MC (CUSTOM PROCEDURE TRAY) ×2 IMPLANT
TROCAR XCEL BLUNT TIP 100MML (ENDOMECHANICALS) ×2 IMPLANT
TROCAR XCEL NON-BLD 11X100MML (ENDOMECHANICALS) ×2 IMPLANT
TROCAR XCEL NON-BLD 5MMX100MML (ENDOMECHANICALS) ×2 IMPLANT
TUBING INSUFFLATION (TUBING) ×2 IMPLANT

## 2016-05-08 NOTE — Anesthesia Postprocedure Evaluation (Signed)
Anesthesia Post Note  Patient: Marella ChimesGeorge F Cassells  Procedure(s) Performed: Procedure(s) (LRB): LAPAROSCOPIC CHOLECYSTECTOMY WITH INTRAOPERATIVE CHOLANGIOGRAM (N/A)  Patient location during evaluation: PACU Anesthesia Type: General Level of consciousness: awake and alert Pain management: pain level controlled Vital Signs Assessment: post-procedure vital signs reviewed and stable Respiratory status: spontaneous breathing, nonlabored ventilation, respiratory function stable and patient connected to nasal cannula oxygen Cardiovascular status: blood pressure returned to baseline and stable Postop Assessment: no signs of nausea or vomiting Anesthetic complications: no    Last Vitals:  Vitals:   05/08/16 1601 05/08/16 1700  BP: 118/69 105/66  Pulse: 61   Resp: 16   Temp: 36.6 C     Last Pain:  Vitals:   05/08/16 1625  TempSrc:   PainSc: 6                  Shelton SilvasKevin D Jhayla Podgorski

## 2016-05-08 NOTE — Progress Notes (Signed)
[facility] PROGRESS NOTE  Justin Wells WJX:[id number] DOB: 1951-01-09 DOA: 05/05/2016  PCP: Carmin Richmond, MD  Brief HPI: This is a 65 year old Caucasian male with a past medical history of coronary artery disease, stroke, essential hypertension, recently hospitalized for focal seizures who presented with the complaints of abdominal pain. Evaluation revealed cholelithiasis. There was concern for cholecystitis. Patient was seen by general surgery. He was hospitalized and the plan is for cholecystectomy during this hospitalization.  Past medical history:  Past Medical History:  Diagnosis Date  . Anxiety   . Arthritis    "hands; back; legs" (04/18/2016)  . Barrett's esophagus   . Cerebrovascular disease   . CHF (congestive heart failure) (HCC)    "said I had this a few years ago; it went away" (04/18/2016)  . Chronic back pain   . Coronary artery disease   . Depression   . Gastroesophageal reflux   . H/O hiatal hernia   . OZHYQMVH(846.9)    "probably monthly" (01/27/2013)  . Hyperlipidemia   . Hypertension   . Peripheral arterial disease (HCC)   . Seizures (HCC)    "daily the last 5 days" (04/18/2016)  . 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 His wife is at the bedside. He complains of persistent pain in the right upper side of his abdomen. Pain is 7-8 out of 10 in intensity. He denies any chest pain. He did have some nausea but no vomiting. No diarrhea.  Objective:  Vital Signs  Vitals:   05/07/16 0507 05/07/16 1410 05/07/16 2043 05/08/16 0500  BP: 116/63 (!) 110/59 118/67 117/69  Pulse: (!) 54 (!) 58 60 61  Resp: 17 20 19 19   Temp: 97.6 F (36.4 C) 97.8 F (36.6 C) 97.7 F (36.5 C) 97.8 F (36.6  C)  TempSrc: Oral Oral Oral Oral  SpO2: 98% 98% 98% 98%  Weight:      Height:        Intake/Output Summary (Last 24 hours) at 05/08/16 1158 Last data filed at 05/08/16 0800  Gross per 24 hour  Intake           1597.5 ml  Output             2030 ml  Net           -432.5 ml   Filed Weights   05/05/16 1943  Weight: 94.8 kg (209 lb)    General appearance: alert, cooperative, appears stated age and no distress Resp: clear to auscultation bilaterally Cardio: regular rate and rhythm, S1, S2 normal, no murmur, click, rub or gallop GI: Abdomen is soft. Tender in the right upper quadrant. No rebound, rigidity or guarding. No masses or organomegaly. Extremities: extremities normal, atraumatic, no cyanosis or edema Neurologic: Awake and alert. Oriented 3. No focal neurological deficits.  Lab Results:  Data Reviewed: I have personally reviewed following labs and imaging studies  CBC:  Recent Labs Lab 05/05/16 1250 05/06/16 0301 05/07/16 0516 05/08/16 0416  WBC 7.1 6.2 9.2 8.5  NEUTROABS  --   --  6.6 5.4  HGB 14.1 13.1 14.6 13.8  HCT 42.1 40.6 44.6 41.9  MCV 87.0 88.1 88.1 88.0  PLT 199 167 186 196   Basic  Metabolic Panel:  Recent Labs Lab 05/05/16 1250 05/06/16 0301 05/07/16 0516 05/08/16 0416  NA 134* 138 140 138  K 3.8 4.1 4.1 4.3  CL 100* 104 101 97*  CO2 27 30 32 29  GLUCOSE 104* 125* 109* 100*  BUN <5* 5* <5* <5*  CREATININE 0.78 1.00 1.14 1.20  CALCIUM 9.1 9.0 9.4 8.8*   GFR: Estimated Creatinine Clearance: 72.8 mL/min (by C-G formula based on SCr of 1.2 mg/dL). Liver Function Tests:  Recent Labs Lab 05/05/16 1405 05/07/16 0516 05/08/16 0416  AST 21 18 18   ALT 19 20 17   ALKPHOS 74 72 61  BILITOT 0.6 1.0 0.7  PROT 6.8 6.3* 6.0*  ALBUMIN 4.0 3.7 3.6    Recent Labs Lab 05/05/16 1405  LIPASE 38   Coagulation Profile:  Recent Labs Lab 05/08/16 0416  INR 1.10   Urine analysis:    Component Value Date/Time   COLORURINE YELLOW  05/05/2016 1415   APPEARANCEUR CLEAR 05/05/2016 1415   LABSPEC 1.009 05/05/2016 1415   PHURINE 7.0 05/05/2016 1415   GLUCOSEU NEGATIVE 05/05/2016 1415   HGBUR NEGATIVE 05/05/2016 1415   BILIRUBINUR NEGATIVE 05/05/2016 1415   KETONESUR NEGATIVE 05/05/2016 1415   PROTEINUR NEGATIVE 05/05/2016 1415   UROBILINOGEN 2.0 (H) 04/12/2015 2010   NITRITE NEGATIVE 05/05/2016 1415   LEUKOCYTESUR NEGATIVE 05/05/2016 1415    Recent Results (from the past 240 hour(s))  Surgical pcr screen     Status: Abnormal   Collection Time: 05/07/16  9:11 PM  Result Value Ref Range Status   MRSA, PCR NEGATIVE NEGATIVE Final   Staphylococcus aureus POSITIVE (A) NEGATIVE Final    Comment:        The Xpert SA Assay (FDA approved for NASAL specimens in patients over 63 years of age), is one component of a comprehensive surveillance program.  Test performance has been validated by [facility] for patients greater than or equal to 788 year old. It is not intended to diagnose infection nor to guide or monitor treatment.       Radiology Studies: Nm Hepatobiliary Including Gb  Result Date: 05/06/2016 CLINICAL DATA:  Right upper quadrant abdominal pain common nausea and vomiting for several weeks. EXAM: NUCLEAR MEDICINE HEPATOBILIARY IMAGING TECHNIQUE: Sequential images of the abdomen were obtained out to 60 minutes following intravenous administration of radiopharmaceutical. RADIOPHARMACEUTICALS:  4.9 mCi Tc-5641m  Choletec IV COMPARISON:  Ultrasound 05/05/2016 FINDINGS: There is symmetric uptake in the liver and prompt excretion into the biliary tree which is visualized at 5 minutes. Activity is seen in the duodenum at 10 minutes. The gallbladder is visualized at 25 minutes. IMPRESSION: Normal biliary patency study. Electronically Signed   By: Rudie MeyerP.  Gallerani M.D.   On: 05/06/2016 14:09     Medications:  Scheduled: . aspirin EC  81 mg Oral Daily  . Chlorhexidine Gluconate Cloth  6 each Topical Daily  .  levETIRAcetam  750 mg Oral BID  . lisinopril  5 mg Oral Daily  . morphine  15 mg Oral BID  . mupirocin ointment  1 application Nasal BID  . pantoprazole  40 mg Oral Daily  . PARoxetine  20 mg Oral Daily  . piperacillin-tazobactam (ZOSYN)  IV  3.375 g Intravenous Q8H  . senna  1 tablet Oral BID  . sodium chloride flush  3 mL Intravenous Q12H   Continuous:  ZOX:WRUEAVPRN:sodium chloride, acetaminophen **OR** acetaminophen, albuterol, bisacodyl, LORazepam, morphine injection, nitroGLYCERIN, ondansetron **OR** ondansetron (ZOFRAN) IV, oxyCODONE, polyethylene glycol, sodium chloride flush, traZODone  Assessment/Plan:  Active Problems:   Cholelithiasis    Symptomatic Cholelithiasis with the suspected acute cholecystitis Patient is followed by general surgery. Patient was taken off of Plavix on September 3. Plan is for cholecystectomy via laparoscopic approach later this afternoon. Patient is on Zosyn, which will be continued. Patient has been seen by cardiology who stated that he is low risk for this procedure.   Focal seizure This was recently diagnosed. Continue Keppra.  History of CAD, recent stress testing last admission 04/2016 within normal limits Continue aspirin alone 81 mg. , Plavix is on hold. Cardiology has seen the patient.  Reflux disease Continue pantoprazole 40 daily  Bipolar disorder  Continue Paxil 20 daily, lorazepam 0.5 every 8 hourly  Chronic pain Continue MS Contin 15 twice a day, cont oxycodone 5 every 6 when necessary breakthrough-pain medications should be managed as an outpatient by his primary physician/operating surgeon  Hyperlipidemia Resume statin on discharge.  DVT Prophylaxis: SCDs    Code Status: Full code  Family Communication: Discussed with the patient and his wife  Disposition Plan: Await cholecystectomy    LOS: 3 days   [facility]  [facility] Pager [phone] 05/08/2016, 11:58 AM  If 7PM-7AM, please contact night-coverage at  [email or web address], password [facility]

## 2016-05-08 NOTE — Anesthesia Preprocedure Evaluation (Signed)
Anesthesia Evaluation  Patient identified by MRN, date of birth, ID band Patient awake    Reviewed: Allergy & Precautions, NPO status , Patient's Chart, lab work & pertinent test results  Airway Mallampati: II       Dental  (+) Missing, Dental Advisory Given   Pulmonary former smoker,    breath sounds clear to auscultation       Cardiovascular hypertension, + CAD, + Peripheral Vascular Disease and +CHF   Rhythm:Regular Rate:Normal     Neuro/Psych  Headaches, Seizures -,  PSYCHIATRIC DISORDERS Anxiety Depression TIACVA    GI/Hepatic Neg liver ROS, hiatal hernia, GERD  Medicated,  Endo/Other  negative endocrine ROS  Renal/GU negative Renal ROS  negative genitourinary   Musculoskeletal  (+) Arthritis ,   Abdominal (+)  Abdomen: tender.    Peds negative pediatric ROS (+)  Hematology negative hematology ROS (+)   Anesthesia Other Findings   Reproductive/Obstetrics negative OB ROS                             Lab Results  Component Value Date   WBC 8.5 05/08/2016   HGB 13.8 05/08/2016   HCT 41.9 05/08/2016   MCV 88.0 05/08/2016   PLT 196 05/08/2016   Lab Results  Component Value Date   CREATININE 1.20 05/08/2016   BUN <5 (L) 05/08/2016   NA 138 05/08/2016   K 4.3 05/08/2016   CL 97 (L) 05/08/2016   CO2 29 05/08/2016   Lab Results  Component Value Date   INR 1.10 05/08/2016   INR 1.10 04/12/2015   INR 1.03 03/07/2014   EKG: normal sinus rhythm.  04/2016 Echo - Left ventricle: The cavity size was normal. There was mild   concentric hypertrophy. Systolic function was normal. The   estimated ejection fraction was in the range of 55% to 60%. Wall   motion was normal; there were no regional wall motion   abnormalities. Doppler parameters are consistent with abnormal   left ventricular relaxation (grade 1 diastolic dysfunction).  Anesthesia Physical Anesthesia Plan  ASA:  III  Anesthesia Plan: General   Post-op Pain Management:    Induction: Intravenous, Rapid sequence and Cricoid pressure planned  Airway Management Planned: Oral ETT  Additional Equipment:   Intra-op Plan:   Post-operative Plan: Extubation in OR  Informed Consent: I have reviewed the patients History and Physical, chart, labs and discussed the procedure including the risks, benefits and alternatives for the proposed anesthesia with the patient or authorized representative who has indicated his/her understanding and acceptance.   Dental advisory given  Plan Discussed with: CRNA  Anesthesia Plan Comments:         Anesthesia Quick Evaluation

## 2016-05-08 NOTE — Progress Notes (Signed)
Day of Surgery  Subjective: Patient with no complaints Ready for surgery this afternoon  Objective: Vital signs in last 24 hours: Temp:  [97.7 F (36.5 C)-97.8 F (36.6 C)] 97.8 F (36.6 C) (09/06 0500) Pulse Rate:  [58-61] 61 (09/06 0500) Resp:  [19-20] 19 (09/06 0500) BP: (110-118)/(59-69) 117/69 (09/06 0500) SpO2:  [98 %] 98 % (09/06 0500) Last BM Date: 05/06/16  Intake/Output from previous day: 09/05 0701 - 09/06 0700 In: 1837.5 [P.O.:1440; I.V.:297.5; IV Piggyback:100] Out: 2575 [Urine:2575] Intake/Output this shift: Total I/O In: -  Out: 280 [Urine:280]  General appearance: alert, cooperative and no distress GI: soft, non-tender; bowel sounds normal; no masses,  no organomegaly Skin - no jaundice  Lab Results:   Recent Labs  05/07/16 0516 05/08/16 0416  WBC 9.2 8.5  HGB 14.6 13.8  HCT 44.6 41.9  PLT 186 196   BMET  Recent Labs  05/07/16 0516 05/08/16 0416  NA 140 138  K 4.1 4.3  CL 101 97*  CO2 32 29  GLUCOSE 109* 100*  BUN <5* <5*  CREATININE 1.14 1.20  CALCIUM 9.4 8.8*   Lab Results  Component Value Date   ALT 17 05/08/2016   AST 18 05/08/2016   ALKPHOS 61 05/08/2016   BILITOT 0.7 05/08/2016    PT/INR  Recent Labs  05/08/16 0416  LABPROT 14.3  INR 1.10   ABG No results for input(s): PHART, HCO3 in the last 72 hours.  Invalid input(s): PCO2, PO2  Studies/Results: Nm Hepatobiliary Including Gb  Result Date: 05/06/2016 CLINICAL DATA:  Right upper quadrant abdominal pain common nausea and vomiting for several weeks. EXAM: NUCLEAR MEDICINE HEPATOBILIARY IMAGING TECHNIQUE: Sequential images of the abdomen were obtained out to 60 minutes following intravenous administration of radiopharmaceutical. RADIOPHARMACEUTICALS:  4.9 mCi [id number]  Choletec IV COMPARISON:  Ultrasound 05/05/2016 FINDINGS: There is symmetric uptake in the liver and prompt excretion into the biliary tree which is visualized at 5 minutes. Activity is seen in the duodenum  at 10 minutes. The gallbladder is visualized at 25 minutes. IMPRESSION: Normal biliary patency study. Electronically Signed   By: Rudie MeyerP.  Gallerani M.D.   On: 05/06/2016 14:09    Anti-infectives: Anti-infectives    Start     Dose/Rate Route Frequency Ordered Stop   05/06/16 1100  piperacillin-tazobactam (ZOSYN) IVPB 3.375 g     3.375 g 12.5 mL/hr over 240 Minutes Intravenous Every 8 hours 05/06/16 1056        Assessment/Plan: s/p Procedure(s): LAPAROSCOPIC CHOLECYSTECTOMY WITH INTRAOPERATIVE CHOLANGIOGRAM (N/A) Surgery today.  The surgical procedure has been discussed with the patient.  Potential risks, benefits, alternative treatments, and expected outcomes have been explained.  All of the patient's questions at this time have been answered.  The likelihood of reaching the patient's treatment goal is good.  The patient understand the proposed surgical procedure and wishes to proceed.   LOS: 3 days    Kelvin Sennett K. 05/08/2016

## 2016-05-08 NOTE — Anesthesia Procedure Notes (Signed)
Procedure Name: Intubation Date/Time: 05/08/2016 2:09 PM Performed by: Faustino CongressWHITE, Lafaye Mcelmurry TENA Naleyah Ohlinger Pre-anesthesia Checklist: Patient identified, Emergency Drugs available, Suction available and Patient being monitored Patient Re-evaluated:Patient Re-evaluated prior to inductionOxygen Delivery Method: Circle System Utilized Preoxygenation: Pre-oxygenation with 100% oxygen Intubation Type: IV induction Ventilation: Mask ventilation without difficulty and Oral airway inserted - appropriate to patient size Laryngoscope Size: Mac and 4 Grade View: Grade II Tube type: Oral Tube size: 7.5 mm Number of attempts: 1 Airway Equipment and Method: Stylet and Oral airway Placement Confirmation: ETT inserted through vocal cords under direct vision,  positive ETCO2 and breath sounds checked- equal and bilateral Secured at: 23 cm Tube secured with: Tape Dental Injury: Teeth and Oropharynx as per pre-operative assessment  Comments: Intubation by Thalia PartyJenn Cambell, SRNA

## 2016-05-08 NOTE — Transfer of Care (Signed)
Immediate Anesthesia Transfer of Care Note  Patient: Justin Wells  Procedure(s) Performed: Procedure(s): LAPAROSCOPIC CHOLECYSTECTOMY WITH INTRAOPERATIVE CHOLANGIOGRAM (N/A)  Patient Location: PACU  Anesthesia Type:General  Level of Consciousness: awake, alert , oriented and patient cooperative  Airway & Oxygen Therapy: Patient Spontanous Breathing and Patient connected to nasal cannula oxygen  Post-op Assessment: Report given to RN and Post -op Vital signs reviewed and stable  Post vital signs: Reviewed and stable  Last Vitals:  Vitals:   05/08/16 0500 05/08/16 1601  BP: 117/69   Pulse: 61 61  Resp: 19   Temp: 36.6 C 36.6 C    Last Pain:  Vitals:   05/08/16 1100  TempSrc:   PainSc: 5          Complications: No apparent anesthesia complications

## 2016-05-08 NOTE — Care Management Important Message (Signed)
Important Message  Patient Details  Name: Justin Wells MRN: [id number] Date of Birth: October 10, 1950   Medicare Important Message Given:  Yes    Dorena BodoIris Kadyn Chovan 05/08/2016, 10:42 AM

## 2016-05-08 NOTE — Op Note (Addendum)
Laparoscopic Cholecystectomy with IOC Procedure Note  Indications: This patient presents with symptomatic gallbladder disease and will undergo laparoscopic cholecystectomy.  He has been on Plavix, so we waited three days to perform the case.  Pre-operative Diagnosis: Calculus of gallbladder with acute cholecystitis, without mention of obstruction  Post-operative Diagnosis: Same  Surgeon: Lorissa Kishbaugh K.   Assistants: Bailey Mech, PA-C  Anesthesia: General endotracheal anesthesia  ASA Class: 2  Procedure Details  The patient was seen again in the Holding Room. The risks, benefits, complications, treatment options, and expected outcomes were discussed with the patient. The possibilities of reaction to medication, pulmonary aspiration, perforation of viscus, bleeding, recurrent infection, finding a normal gallbladder, the need for additional procedures, failure to diagnose a condition, the possible need to convert to an open procedure, and creating a complication requiring transfusion or operation were discussed with the patient. The likelihood of improving the patient's symptoms with return to their baseline status is good.  The patient and/or family concurred with the proposed plan, giving informed consent. The site of surgery properly noted. The patient was taken to Operating Room, identified as ATTIKUS BARTOSZEK and the procedure verified as Laparoscopic Cholecystectomy with Intraoperative Cholangiogram. A Time Out was held and the above information confirmed.  Prior to the induction of general anesthesia, antibiotic prophylaxis was administered. General endotracheal anesthesia was then administered and tolerated well. After the induction, the abdomen was prepped with Chloraprep and draped in the sterile fashion. The patient was positioned in the supine position.  Local anesthetic agent was injected into the skin below the umbilicus and an incision made. We dissected down to the abdominal fascia  with blunt dissection.  The fascia was incised vertically and we entered the peritoneal cavity bluntly.  A pursestring suture of 0-Vicryl was placed around the fascial opening.  The Hasson cannula was inserted and secured with the stay suture.  Pneumoperitoneum was then created with CO2 and tolerated well without any adverse changes in the patient's vital signs. An 11-mm port was placed in the subxiphoid position.  Two 5-mm ports were placed in the right upper quadrant. All skin incisions were infiltrated with a local anesthetic agent before making the incision and placing the trocars.   We positioned the patient in reverse Trendelenburg, tilted slightly to the patient's left.  There are significant omental adhesions in the upper abdomen, adherent to the gallbladder and the edge of the liver.  We used cautery scissors to take down some of these adhesions.  The gallbladder was identified, the fundus grasped and retracted cephalad by the assistant.  Visualization was quite difficult due to the adhesions from chronic inflammation, so the assistant was greatly needed.  Adhesions were lysed bluntly and with the electrocautery where indicated, taking care not to injure any adjacent organs or viscus. The infundibulum was grasped and retracted laterally, exposing the peritoneum overlying the triangle of Calot. This was then divided and exposed in a blunt fashion. A critical view of the cystic duct and cystic artery was obtained.  The cystic duct was clearly identified and bluntly dissected circumferentially. The cystic duct was ligated with a clip distally.   An incision was made in the cystic duct and the [employer] [facility] cholangiogram catheter introduced. The catheter was secured using a clip. A cholangiogram was then obtained which showed good visualization of the distal and proximal biliary tree with no sign of filling defects or obstruction.  Contrast flowed easily into the duodenum. The catheter was then removed.   The cystic  duct was then ligated with clips and divided. The cystic artery was identified, dissected free, ligated with clips and divided as well.   The gallbladder was dissected from the liver bed in retrograde fashion with the electrocautery. The gallbladder was removed and placed in an Endocatch sac. The liver bed was irrigated and inspected. Hemostasis was achieved with the electrocautery and Surgicel SNOW. Copious irrigation was utilized and was repeatedly aspirated until clear.  The gallbladder and Endocatch sac were then placed into a stronger ECO sac for removal.  We removed the bag  through the umbilical port site, which required additional incision of the fascia to allow complete removal.  0 PDS was used to close the umbilical fascia.    We again inspected the right upper quadrant for hemostasis.  Pneumoperitoneum was released as we removed the trocars.  4-0 Monocryl was used to close the skin.   Benzoin, steri-strips, and clean dressings were applied. The patient was then extubated and brought to the recovery room in stable condition. Instrument, sponge, and needle counts were correct at closure and at the conclusion of the case.   Findings: Cholecystitis with Cholelithiasis  Estimated Blood Loss: less than 100 mL         Drains: none         Specimens: Gallbladder           Complications: None; patient tolerated the procedure well.         Disposition: PACU - hemodynamically stable.         Condition: stable  Wilmon ArmsMatthew K. Corliss Skainssuei, MD, [facility] SystemF[facility]  General/ Trauma Surgery  05/08/2016 3:40 PM

## 2016-05-08 NOTE — Progress Notes (Signed)
Changed gauze dressing at umbilicus lap site due to serous drainage around 1954. Patient was OOB using front walker to the bathroom to void around 2230 and noted new drainage at the umbilicus lap site when patient returned to bed. Cleansed said lap site, reinforced with steri strips, changed gauze dressing, and applied ice pack. Will closely monitor said lap site.

## 2016-05-09 ENCOUNTER — Encounter (HOSPITAL_COMMUNITY): Payer: Self-pay | Admitting: Surgery

## 2016-05-09 LAB — CBC
HCT: 38.4 % — ABNORMAL LOW (ref 39.0–52.0)
HEMATOCRIT: 38.6 % — AB (ref 39.0–52.0)
HEMOGLOBIN: 12.4 g/dL — AB (ref 13.0–17.0)
Hemoglobin: 12.8 g/dL — ABNORMAL LOW (ref 13.0–17.0)
MCH: 28.9 pg (ref 26.0–34.0)
MCH: 28.4 pg (ref 26.0–34.0)
MCHC: 33.2 g/dL (ref 30.0–36.0)
MCHC: 32.3 g/dL (ref 30.0–36.0)
MCV: 87.1 fL (ref 78.0–100.0)
MCV: 87.9 fL (ref 78.0–100.0)
Platelets: 182 10*3/uL (ref 150–400)
Platelets: 181 10*3/uL (ref 150–400)
RBC: 4.43 MIL/uL (ref 4.22–5.81)
RBC: 4.37 MIL/uL (ref 4.22–5.81)
RDW: 13.7 % (ref 11.5–15.5)
RDW: 13.8 % (ref 11.5–15.5)
WBC: 9.7 10*3/uL (ref 4.0–10.5)
WBC: 9.2 10*3/uL (ref 4.0–10.5)

## 2016-05-09 LAB — COMPREHENSIVE METABOLIC PANEL
ALK PHOS: 59 U/L (ref 38–126)
ALT: 23 U/L (ref 17–63)
ANION GAP: 9 (ref 5–15)
AST: 31 U/L (ref 15–41)
Albumin: 3.4 g/dL — ABNORMAL LOW (ref 3.5–5.0)
BILIRUBIN TOTAL: 1.2 mg/dL (ref 0.3–1.2)
BUN: 9 mg/dL (ref 6–20)
CALCIUM: 8.6 mg/dL — AB (ref 8.9–10.3)
CO2: 28 mmol/L (ref 22–32)
CREATININE: 1.15 mg/dL (ref 0.61–1.24)
Chloride: 100 mmol/L — ABNORMAL LOW (ref 101–111)
Glucose, Bld: 117 mg/dL — ABNORMAL HIGH (ref 65–99)
Potassium: 3.9 mmol/L (ref 3.5–5.1)
SODIUM: 137 mmol/L (ref 135–145)
TOTAL PROTEIN: 5.8 g/dL — AB (ref 6.5–8.1)

## 2016-05-09 MED ORDER — POLYETHYLENE GLYCOL 3350 17 G PO PACK: 17.0000 g | PACK | Freq: Every day | ORAL | 0 refills | Status: DC | PRN

## 2016-05-09 MED ORDER — SENNA 8.6 MG PO TABS: 1.0000 | ORAL_TABLET | Freq: Two times a day (BID) | ORAL | 0 refills | Status: DC

## 2016-05-09 NOTE — Plan of Care (Signed)
Problem: Activity: Goal: Risk for activity intolerance will decrease Outcome: Progressing Pt mobilized a full lap on unit this morning with no complaints voiced.  Problem: Fluid Volume: Goal: Ability to maintain a balanced intake and output will improve Outcome: Progressing Tolerating a soft diet well

## 2016-05-09 NOTE — Discharge Summary (Signed)
[facility]  Physician Discharge Summary   Patient ID: Justin Wells MRN: [id number] DOB/AGE: 1951/05/12 65 y.o.  Admit date: 05/05/2016 Discharge date: 05/09/2016  PCP: Carmin Richmond, MD  DISCHARGE DIAGNOSES:  Active Problems:   Calculus of gallbladder without cholecystitis without obstruction   Focal motor seizure disorder (HCC)   RECOMMENDATIONS FOR OUTPATIENT FOLLOW UP: 1. Outpatient follow up with general surgery   DISCHARGE CONDITION: fair  Diet recommendation: As per general surgery instructiond  Filed Weights   05/05/16 1943  Weight: 94.8 kg (209 lb)    INITIAL HISTORY: This is a 65 year old Caucasian male with a past medical history of coronary artery disease, stroke, essential hypertension, recently hospitalized for focal seizures who presented with the complaints of abdominal pain. Evaluation revealed cholelithiasis. There was concern for cholecystitis. Patient was seen by general surgery. He was hospitalized and the plan is for cholecystectomy during this hospitalization.  Consultants: General Surgery, Cardiology  Procedures: Lap Chole  HOSPITAL COURSE:   Symptomatic Cholelithiasis with the suspected acute cholecystitis Patient was seen by general surgery. Patient was started on Zosyn. Patient was taken off of Plavix on September 3. Patient was been seen by cardiology who stated that he is low risk for this procedure. Patient underwent cholecystectomy on 9/6. He did well post-operatively except for minor bleeding issue at incision site which has resolved. Ok for DC per Careers adviser. Patient tolerated lunch and ambulated.  Focal seizure This was recently diagnosed. Continue Keppra.  History of CAD, recent stress testing last admission 04/2016 within normal limits Continue aspirin alone 81 mg. Plavix can be resumed tomorrow per surgeon.   Reflux disease Continue home meds.   Bipolar disorder  Continue home medications.   Chronic pain Continue  home medications. Patient told that I will be unable to write prescription for pain meds as he is on chronic regimen.  Hyperlipidemia Resume statin on discharge.  Overall improved and stable for discharge.    PERTINENT LABS:  The results of significant diagnostics from this hospitalization (including imaging, microbiology, ancillary and laboratory) are listed below for reference.    Microbiology: Recent Results (from the past 240 hour(s))  Surgical pcr screen     Status: Abnormal   Collection Time: 05/07/16  9:11 PM  Result Value Ref Range Status   MRSA, PCR NEGATIVE NEGATIVE Final   Staphylococcus aureus POSITIVE (A) NEGATIVE Final    Comment:        The Xpert SA Assay (FDA approved for NASAL specimens in patients over 39 years of age), is one component of a comprehensive surveillance program.  Test performance has been validated by [facility] for patients greater than or equal to 66 year old. It is not intended to diagnose infection nor to guide or monitor treatment.      Labs: Basic Metabolic Panel:  Recent Labs Lab 05/05/16 1250 05/06/16 0301 05/07/16 0516 05/08/16 0416 05/09/16 0438  NA 134* 138 140 138 137  K 3.8 4.1 4.1 4.3 3.9  CL 100* 104 101 97* 100*  CO2 27 30 32 29 28  GLUCOSE 104* 125* 109* 100* 117*  BUN <5* 5* <5* <5* 9  CREATININE 0.78 1.00 1.14 1.20 1.15  CALCIUM 9.1 9.0 9.4 8.8* 8.6*   Liver Function Tests:  Recent Labs Lab 05/05/16 1405 05/07/16 0516 05/08/16 0416 05/09/16 0438  AST 21 18 18 31   ALT 19 20 17 23   ALKPHOS 74 72 61 59  BILITOT 0.6 1.0 0.7 1.2  PROT 6.8 6.3* 6.0* 5.8*  ALBUMIN 4.0 3.7 3.6 3.4*    Recent Labs Lab 05/05/16 1405  LIPASE 38   CBC:  Recent Labs Lab 05/06/16 0301 05/07/16 0516 05/08/16 0416 05/09/16 0438 05/09/16 1116  WBC 6.2 9.2 8.5 9.7 9.2  NEUTROABS  --  6.6 5.4  --   --   HGB 13.1 14.6 13.8 12.8* 12.4*  HCT 40.6 44.6 41.9 38.6* 38.4*  MCV 88.1 88.1 88.0 87.1 87.9  PLT 167 186  196 182 181   BNP: BNP (last 3 results)  Recent Labs  05/05/16 1405  BNP 38.2    IMAGING STUDIES Dg Chest 2 View  Result Date: 05/05/2016 CLINICAL DATA:  65 year old male with a history of chest pain EXAM: CHEST  2 VIEW COMPARISON:  04/18/2016, CT chest 04/19/2016 FINDINGS: Cardiomediastinal silhouette within normal limits. No central vascular congestion. No confluent airspace disease or pneumothorax.  No pleural effusion. Coronary artery stents. Chronic coarsening of interstitial markings. No displaced fracture.  Degenerative changes of the spine. IMPRESSION: Chronic lung changes without evidence of superimposed acute cardiopulmonary disease. Evidence of prior coronary artery stenting/CAD. Signed, Yvone Neu. Loreta Ave, DO [facility] Radiology Electronically Signed   By: Gilmer Mor D.O.   On: 05/05/2016 12:14   Dg Abd 1 View  Result Date: 04/19/2016 CLINICAL DATA:  Acute onset of epigastric abdominal pain. Initial encounter. EXAM: ABDOMEN - 1 VIEW COMPARISON:  Chest and abdominal radiographs performed 09/02/2008 FINDINGS: The visualized bowel gas pattern is unremarkable. Scattered air and stool filled loops of colon are seen; no abnormal dilatation of small bowel loops is seen to suggest small bowel obstruction. No free intra-abdominal air is identified, though evaluation for free air is limited on a single supine view. The visualized osseous structures are within normal limits; the sacroiliac joints are unremarkable in appearance. IMPRESSION: Unremarkable bowel gas pattern; no free intra-abdominal air seen. Moderate amount of stool noted in the colon. Electronically Signed   By: Roanna Raider M.D.   On: 04/19/2016 02:06   Dg Cholangiogram Operative  Result Date: 05/08/2016 CLINICAL DATA:  Cholelithiasis EXAM: INTRAOPERATIVE CHOLANGIOGRAM TECHNIQUE: Cholangiographic images from the C-arm fluoroscopic device were submitted for interpretation  post-operatively. Please see the procedural report for the amount of contrast and the fluoroscopy time utilized. COMPARISON:  None. FINDINGS: Contrast fills the biliary tree and duodenum without filling defects in the common bile duct. The common bile duct is mildly prominent. IMPRESSION: Patent biliary tree. Electronically Signed   By: Jolaine Click M.D.   On: 05/08/2016 15:17   Mr Maxine Glenn Head Wo Contrast  Result Date: 04/19/2016 CLINICAL DATA:  65 y/o M; PE recurrence. Type spells for several years with feeling of dizziness and confusion. Coal the concern for posterior circulation hypoperfusion versus seizures. EXAM: MRI HEAD WITHOUT CONTRAST MRA HEAD WITHOUT CONTRAST TECHNIQUE: Multiplanar, multiecho pulse sequences of the brain and surrounding structures were obtained without intravenous contrast. Angiographic images of the head were obtained using MRA technique without contrast. COMPARISON:  MRI of the brain dated 04/13/2015. MRA of the head dated 12/27/2013. FINDINGS: MRI HEAD FINDINGS Brain: No diffusion restriction to suggest acute or early subacute infarct. No abnormal susceptibility hypointensity to indicate intracranial hemorrhage. Mild chronic microvascular ischemic changes are stable. No focal mass effect. No gross structural abnormality, disorder cortical formation, or gray matter heterotopia is identified. Hippocampi by are symmetric in size bilaterally. Extra-axial space: Normal ventricular size. No midline shift. No effacement of basilar cisterns. No extra-axial collection is identified. Proximal intracranial flow voids are  maintained. No abnormality of the cervical medullary junction. Other: Mild diffuse paranasal sinus mucosal thickening. Pain partial opacification of posterior ethmoid air cells. No abnormal signal of the mastoid air cells. Orbits are unremarkable. Calvarium is unremarkable. MRA HEAD FINDINGS Internal carotid arteries:  Patent. Anterior cerebral arteries:  Patent. Middle cerebral  arteries: Patent. Anterior communicating artery: Patent. Posterior communicating arteries: Patent left posterior communicating artery. No right kidney Posterior cerebral arteries: Patent. Apparent decreased flow related signal within the left posterior cerebral circulation is probably due to fetal PCA origin and signal saturation. No proximal stenosis is identified. Basilar artery:  Patent.  Proximal fenestration. Vertebral arteries:  Patent. No evidence of high-grade stenosis, large vessel occlusion, or aneurysm unless noted above. IMPRESSION: 1. No occlusion, aneurysm, dissection, or significant stenosis is identified. Mild motion artifact of the MRA sequence. No gross interval change. 2. No evidence of acute infarct, intracranial hemorrhage, focal mass effect, or hydrocephalus. Stable mild chronic microvascular ischemic changes and parenchymal volume loss. 3. No structural abnormality identified. Electronically Signed   By: Mitzi Hansen M.D.   On: 04/19/2016 02:16   Mr Brain Wo Contrast  Result Date: 04/19/2016 CLINICAL DATA:  65 y/o M; PE recurrence. Type spells for several years with feeling of dizziness and confusion. Coal the concern for posterior circulation hypoperfusion versus seizures. EXAM: MRI HEAD WITHOUT CONTRAST MRA HEAD WITHOUT CONTRAST TECHNIQUE: Multiplanar, multiecho pulse sequences of the brain and surrounding structures were obtained without intravenous contrast. Angiographic images of the head were obtained using MRA technique without contrast. COMPARISON:  MRI of the brain dated 04/13/2015. MRA of the head dated 12/27/2013. FINDINGS: MRI HEAD FINDINGS Brain: No diffusion restriction to suggest acute or early subacute infarct. No abnormal susceptibility hypointensity to indicate intracranial hemorrhage. Mild chronic microvascular ischemic changes are stable. No focal mass effect. No gross structural abnormality, disorder cortical formation, or gray matter heterotopia is  identified. Hippocampi by are symmetric in size bilaterally. Extra-axial space: Normal ventricular size. No midline shift. No effacement of basilar cisterns. No extra-axial collection is identified. Proximal intracranial flow voids are maintained. No abnormality of the cervical medullary junction. Other: Mild diffuse paranasal sinus mucosal thickening. Pain partial opacification of posterior ethmoid air cells. No abnormal signal of the mastoid air cells. Orbits are unremarkable. Calvarium is unremarkable. MRA HEAD FINDINGS Internal carotid arteries:  Patent. Anterior cerebral arteries:  Patent. Middle cerebral arteries: Patent. Anterior communicating artery: Patent. Posterior communicating arteries: Patent left posterior communicating artery. No right kidney Posterior cerebral arteries: Patent. Apparent decreased flow related signal within the left posterior cerebral circulation is probably due to fetal PCA origin and signal saturation. No proximal stenosis is identified. Basilar artery:  Patent.  Proximal fenestration. Vertebral arteries:  Patent. No evidence of high-grade stenosis, large vessel occlusion, or aneurysm unless noted above. IMPRESSION: 1. No occlusion, aneurysm, dissection, or significant stenosis is identified. Mild motion artifact of the MRA sequence. No gross interval change. 2. No evidence of acute infarct, intracranial hemorrhage, focal mass effect, or hydrocephalus. Stable mild chronic microvascular ischemic changes and parenchymal volume loss. 3. No structural abnormality identified. Electronically Signed   By: Mitzi Hansen M.D.   On: 04/19/2016 02:16   Mr Cervical Spine Wo Contrast  Result Date: 04/20/2016 CLINICAL DATA:  Recurrent spells of unclear etiology with chest pain and left arm pain. History of seizures. EXAM: MRI CERVICAL SPINE WITHOUT CONTRAST TECHNIQUE: Multiplanar, multisequence MR imaging of the cervical spine was performed. No intravenous contrast was  administered. COMPARISON:  MRI and MRA brain  04/19/2016. Cervical spine MRI 04/26/2014. FINDINGS: Despite efforts by the technologist and patient, motion artifact is present on today's exam and could not be eliminated. This reduces exam sensitivity and specificity. Alignment: Stable and near anatomic. Vertebrae: No acute or suspicious osseous findings. Stable endplate degenerative changes at C4-5. Cord: Normal in signal and caliber. Posterior Fossa, vertebral arteries, paraspinal tissues: Visualized portions of the posterior fossa and paraspinal soft tissues appear unremarkable. Bilateral vertebral artery flow voids. Disc levels: C2-3: Mild uncinate spurring. No significant spinal stenosis or nerve root encroachment. C3-4: Stable asymmetric uncinate spurring on the left contributing to mild left foraminal narrowing. No cord deformity. C4-5: The CSF surrounding the cord is partially effaced, due to chronic spondylosis with endplate osteophytes and bilateral uncinate spurring. The AP diameter of the canal is approximately 10 mm. There is mild right and moderate left foraminal narrowing which appears chronic. There is possible left C5 nerve root encroachment. C5-6: Stable mild uncinate spurring and facet hypertrophy. No cord deformity or significant foraminal compromise. C6-7: Stable mild uncinate spurring and facet hypertrophy. No spinal stenosis or nerve root encroachment. C7-T1: No significant findings. IMPRESSION: 1. No acute findings, cord deformity or abnormal cord signal. 2. Generally stable multilevel spondylosis, greatest at C4-5. Endplate osteophytes and uncinate spurring at that level contribute to borderline spinal stenosis and left-greater-than-right foraminal narrowing. There is potential left C5 nerve root encroachment. Electronically Signed   By: Carey BullocksWilliam  Veazey M.D.   On: 04/20/2016 15:28   Nm Hepatobiliary Including Gb  Result Date: 05/06/2016 CLINICAL DATA:  Right upper quadrant abdominal pain  common nausea and vomiting for several weeks. EXAM: NUCLEAR MEDICINE HEPATOBILIARY IMAGING TECHNIQUE: Sequential images of the abdomen were obtained out to 60 minutes following intravenous administration of radiopharmaceutical. RADIOPHARMACEUTICALS:  4.9 mCi Tc-3434m  Choletec IV COMPARISON:  Ultrasound 05/05/2016 FINDINGS: There is symmetric uptake in the liver and prompt excretion into the biliary tree which is visualized at 5 minutes. Activity is seen in the duodenum at 10 minutes. The gallbladder is visualized at 25 minutes. IMPRESSION: Normal biliary patency study. Electronically Signed   By: Rudie MeyerP.  Gallerani M.D.   On: 05/06/2016 14:09   Nm Myocar Multi W/spect W/wall Motion / Ef  Result Date: 04/20/2016 CLINICAL DATA:  Chest pain.  Ex-smoker.  Prior angioplasty EXAM: MYOCARDIAL IMAGING WITH SPECT (REST AND PHARMACOLOGIC-STRESS) GATED LEFT VENTRICULAR WALL MOTION STUDY LEFT VENTRICULAR EJECTION FRACTION TECHNIQUE: Standard myocardial SPECT imaging was performed after resting intravenous injection of 10 mCi Tc-4734m tetrofosmin. Subsequently, intravenous infusion of Lexiscan was performed under the supervision of the Cardiology staff. At peak effect of the drug, 30 mCi Tc-434m tetrofosmin was injected intravenously and standard myocardial SPECT imaging was performed. Quantitative gated imaging was also performed to evaluate left ventricular wall motion, and estimate left ventricular ejection fraction. COMPARISON:  CTA of 04/19/2016. FINDINGS: Perfusion: No decreased activity in the left ventricle on stress imaging to suggest reversible ischemia or infarction. Wall Motion: Normal left ventricular wall motion. No left ventricular dilation. Left Ventricular Ejection Fraction: 62 % End diastolic volume 89 ml End systolic volume 34 ml IMPRESSION: 1. No reversible ischemia or infarction. 2. Normal left ventricular wall motion. 3. Left ventricular ejection fraction 62% 4. Non invasive risk stratification*: Low *2012  Appropriate Use Criteria for Coronary Revascularization Focused Update: J Am Coll Cardiol. 2012;59(9):857-881. http://content.dementiazones.comonlinejacc.org/article.aspx?articleid=1201161 Electronically Signed   By: Jeronimo GreavesKyle  Talbot M.D.   On: 04/20/2016 15:21   [place] Abdomen Limited  Result Date: 05/05/2016 CLINICAL DATA:  65 year old male with a history of  right upper quadrant pain EXAM: US ABDOMEN LIMITED - RIGHT UPPER QUADRANT COMPARISON:  Ultrasound 04/21/2016 FINDINGS: Gallbladder: Similar appearance to the prior ultrasound survey, with hyperechoic debris/material at the neck of the gallbladder with posterior acoustic shadowing. Sonographic Eulah Pont sign is reported as negative. Gallbladder wall thickness measures 2 mm without striations or pericholecystic fluid. Common bile duct: Diameter: 7 mm Liver: Focal hypoechoic regions at the hilum of the liver similar to the comparison ultrasound survey. IMPRESSION: Cholelithiasis without sonographic evidence of acute cholecystitis. Focal hypoechoic regions of the liver parenchyma at the hilum of the liver, likely representing geographic distribution of fatty infiltration. Signed, Yvone Neu. Loreta Ave, DO [facility] Radiology Electronically Signed   By: Gilmer Mor D.O.   On: 05/05/2016 15:26   Dg Chest Port 1 View  Result Date: 04/18/2016 CLINICAL DATA:  65 year old male with central chest pain and shortness of breath for 3 days. Initial encounter. Former smoker. EXAM: PORTABLE CHEST 1 VIEW COMPARISON:  Chest radiographs 03/07/2014 and earlier. FINDINGS: Portable AP semi upright view at at 1306 hours. Mediastinal contours remain normal. Visualized tracheal air column is within normal limits. Mild increased interstitial markings have not significantly changed. No pneumothorax, pulmonary edema, pleural effusion or confluent pulmonary opacity. Multiple EKG leads and wires overlie the chest. Negative visible bowel gas pattern. IMPRESSION: No  acute cardiopulmonary abnormality. Electronically Signed   By: Odessa Fleming M.D.   On: 04/18/2016 13:20   Ct Angio Chest/abd/pel For Dissection W And/or W/wo  Result Date: 04/19/2016 CLINICAL DATA:  65 year old with midline chest pain. EXAM: CT ANGIOGRAPHY CHEST, ABDOMEN AND PELVIS TECHNIQUE: Multidetector CT imaging through the chest, abdomen and pelvis was performed using the standard protocol during bolus administration of intravenous contrast. Multiplanar reconstructed images and MIPs were obtained and reviewed to evaluate the vascular anatomy. CONTRAST:  100 mL Isovue 370 COMPARISON:  03/07/2014 FINDINGS: CTA CHEST FINDINGS Vascular structures: Coronary artery calcifications. Normal caliber of the thoracic aorta without intramural hematoma or dissection. Ascending thoracic aorta measures up to 3.4 cm. 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 The right subclavian artery extends posterior to the esophagus and there is no aneurysm at the origin. Pulmonary arteries are patent and no evidence for pulmonary embolism. Mediastinum: There is no significant chest lymphadenopathy. Again noted are surgical changes at the GE junction and proximal stomach. Mild fullness of the distal esophagus is unchanged. Lungs/pleural: Trachea and mainstem bronchi are patent. There are some blebs at the lung apices, particularly on the right side. Lungs are essentially clear. There are densities in the lower lobes that probably represent atelectasis. No large pleural effusions. No significant airspace disease or consolidation in the lungs. Musculoskeletal: Degenerative changes in thoracic spine. No acute bone abnormality. Review of the MIP images confirms the above findings. CTA ABDOMEN AND PELVIS FINDINGS Vascular: Normal caliber of the abdominal aorta without aneurysm. Normal caliber of the internal iliac arteries without aneurysm. Atherosclerotic disease in the abdominal aorta. Variant celiac trunk  anatomy. The common hepatic artery originates directly from the aorta. The left gastric artery may have been ligated from previous gastric surgery. There is a prominent right gastric artery. Splenic artery is patent. Mild narrowing at the origin of the SMA. This SMA narrowing could be related to external compression or noncalcified plaque. This is probably not hemodynamically significant stenosis. Inferior mesenteric artery is patent. Bilateral renal arteries are patent. Diffuse atherosclerotic disease in the iliac arteries. Severe stenosis at the origin of the right  SFA. Stenosis at the origin of the left SFA. There is a stent in the proximal left SFA which has some intra stent stenosis. Profunda femoral arteries are patent bilaterally. Liver/biliary: Gallbladder is moderately distended with multiple gallstones. No gross abnormality to the liver. No inflammatory changes around the gallbladder. Pancreas: Normal appearance of the pancreas without inflammation or duct dilatation. Spleen: Normal appearance of spleen without enlargement. Adrenal/urinary tract: Normal adrenal glands. Normal appearance of both kidneys and bilateral adrenal glands. No hydronephrosis. Normal appearance of the urinary bladder with mild distention. 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 There is no significant bowel dilatation and no evidence for obstruction. Lymphatics:  No suspicious lymphadenopathy. Reproductive: No gross abnormality to the prostate or seminal vesicles. Other: No free fluid in the pelvis. No free air. Fat containing inguinal hernias. Musculoskeletal:  No acute abnormality. Review of the MIP images confirms the above findings. IMPRESSION: Negative for an aortic dissection or aneurysm. Cholelithiasis. Postsurgical changes in stomach. 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 Variant vascular anatomy. 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 Variant celiac trunk anatomy as described. Atherosclerotic disease with stenosis in the bilateral superficial femoral arteries as described. Electronically Signed   By: Richarda Overlie M.D.   On: 04/19/2016 14:31   US Abdomen Limited Ruq  Result Date: 04/21/2016 CLINICAL DATA:  Cholelithiasis. Right upper quadrant pain for 2 days. EXAM: US ABDOMEN LIMITED - RIGHT UPPER QUADRANT COMPARISON:  Plain films of 04/19/2016 and CT of 03/07/2014. FINDINGS: Gallbladder: Multiple gallstones including at up to 1.6 cm. No wall thickening or pericholecystic fluid. Sonographic Murphy's sign was not elicited. Common bile duct: Diameter: Normal, 6 mm. Liver: Increased hepatic echogenicity is consistent with steatosis. Areas of relative hypo echogenicity including adjacent the gallbladder which are likely related to is sparing. Some areas, including adjacent the portal vein on image 16, are somewhat masslike. IMPRESSION: 1. Cholelithiasis without acute cholecystitis or biliary duct dilatation. 2. Hepatic steatosis. 3. Probable sparing of steatosis throughout the liver. As some of these areas are relatively masslike, consider follow-up with right upper quadrant ultrasound in 1-2 months to confirm stability. A more aggressive approach, especially if the patient has a history of liver disease or primary malignancy, includes pre and post contrast abdominal MRI. This should be performed when the patient is able to breath hold and follow directions (as an outpatient). Electronically Signed   By: Jeronimo Greaves M.D.   On: 04/21/2016 09:24    DISCHARGE EXAMINATION: Vitals:   05/09/16 0202 05/09/16 0550 05/09/16 0601 05/09/16 1038  BP: (!) 90/59 (!) 93/58 (!) 100/58 (!) 92/58  Pulse: 66 70 68 69  Resp: 16 16    Temp: 98.6 F (37 C) 98.4 F (36.9 C)  98.8 F (37.1 C)  TempSrc: Oral Oral  Oral  SpO2: 94% 95%  93%  Weight:      Height:       General appearance: alert, cooperative, appears stated age and no  distress Resp: clear to auscultation bilaterally Cardio: regular rate and rhythm, S1, S2 normal, no murmur, click, rub or gallop Extremities: extremities normal, atraumatic, no cyanosis or edema Neurologic: Grossly normal  DISPOSITION: Home with wife  Discharge Instructions    Call MD for:  difficulty breathing, headache or visual disturbances    Complete by:  As directed   Call MD for:  difficulty breathing, headache or visual disturbances    Complete by:  As directed   Call  MD for:  extreme fatigue    Complete by:  As directed   Call MD for:  extreme fatigue    Complete by:  As directed   Call MD for:  persistant dizziness or light-headedness    Complete by:  As directed   Call MD for:  persistant dizziness or light-headedness    Complete by:  As directed   Call MD for:  persistant nausea and vomiting    Complete by:  As directed   Call MD for:  persistant nausea and vomiting    Complete by:  As directed   Call MD for:  redness, tenderness, or signs of infection (pain, swelling, redness, odor or green/yellow discharge around incision site)    Complete by:  As directed   Call MD for:  redness, tenderness, or signs of infection (pain, swelling, redness, odor or green/yellow discharge around incision site)    Complete by:  As directed   Call MD for:  severe uncontrolled pain    Complete by:  As directed   Call MD for:  severe uncontrolled pain    Complete by:  As directed   Call MD for:  temperature >100.4    Complete by:  As directed   Call MD for:  temperature >100.4    Complete by:  As directed   Discharge instructions    Complete by:  As directed   OK TO RESUME PLAVIX ON 05/10/16.   You were cared for by a hospitalist during your hospital stay. If you have any questions about your discharge medications or the care you received while you were in the hospital after you are discharged, you can call the unit and asked to speak with the hospitalist on call if the hospitalist that took care of  you is not available. Once you are discharged, your primary care physician will handle any further medical issues. Please note that NO REFILLS for any discharge medications will be authorized once you are discharged, as it is imperative that you return to your primary care physician (or establish a relationship with a primary care physician if you do not have one) for your aftercare needs so that they can reassess your need for medications and monitor your lab values. If you do not have a primary care physician, you can call [phone] for a physician referral.   Discharge instructions    Complete by:  As directed   Please follow surgery instructions.   You were cared for by a hospitalist during your hospital stay. If you have any questions about your discharge medications or the care you received while you were in the hospital after you are discharged, you can call the unit and asked to speak with the hospitalist on call if the hospitalist that took care of you is not available. Once you are discharged, your primary care physician will handle any further medical issues. Please note that NO REFILLS for any discharge medications will be authorized once you are discharged, as it is imperative that you return to your primary care physician (or establish a relationship with a primary care physician if you do not have one) for your aftercare needs so that they can reassess your need for medications and monitor your lab values. If you do not have a primary care physician, you can call [phone] for a physician referral.   Increase activity slowly    Complete by:  As directed   Increase activity slowly    Complete by:  As directed  ALLERGIES: No Known Allergies   Current Discharge Medication List    START taking these medications   Details  polyethylene glycol (MIRALAX / GLYCOLAX) packet Take 17 g by mouth daily as needed for mild constipation. Qty: 14 each, Refills: 0    senna (SENOKOT) 8.6 MG TABS  tablet Take 1 tablet (8.6 mg total) by mouth 2 (two) times daily. Qty: 120 each, Refills: 0      CONTINUE these medications which have NOT CHANGED   Details  aspirin EC 81 MG tablet Take 1 tablet (81 mg total) by mouth daily. Qty: 30 tablet, Refills: 0    clopidogrel (PLAVIX) 75 MG tablet TAKE ONE TABLET BY MOUTH ONCE DAILY Qty: 30 tablet, Refills: 9    esomeprazole (NEXIUM) 40 MG capsule Take 40 mg by mouth at bedtime.     Homeopathic Products (LEG CRAMP RELIEF PO) Take 2-4 tablets by mouth as needed (for leg cramps).    HYDROmorphone (DILAUDID) 4 MG tablet Take 4 mg by mouth every 6 (six) hours as needed (for breakthrough pain).     levETIRAcetam (KEPPRA) 750 MG tablet Take 1 tablet (750 mg total) by mouth 2 (two) times daily. Qty: 60 tablet, Refills: 1    lisinopril (PRINIVIL,ZESTRIL) 5 MG tablet Take 1 tablet (5 mg total) by mouth daily. Qty: 30 tablet, Refills: 1    LORazepam (ATIVAN) 0.5 MG tablet Take 0.5 mg by mouth every 8 (eight) hours as needed for anxiety.    morphine (MS CONTIN) 15 MG 12 hr tablet Take 15 mg by mouth 2 (two) times daily.    nitroGLYCERIN (NITROSTAT) 0.4 MG SL tablet Place 0.4 mg under the tongue every 5 (five) minutes as needed for chest pain.    PARoxetine (PAXIL) 20 MG tablet Take 1 tablet (20 mg total) by mouth every morning. Qty: 30 tablet, Refills: 1    atorvastatin (LIPITOR) 80 MG tablet Take 1 tablet (80 mg total) by mouth daily at 6 (six) AM. Qty: 30 tablet, Refills: 0    clonazePAM (KLONOPIN) 0.5 MG tablet Take 0.5 tablets (0.25 mg total) by mouth 2 (two) times daily. Qty: 60 tablet, Refills: 0    cyclobenzaprine (FLEXERIL) 10 MG tablet Take 0.5 tablets (5 mg total) by mouth 2 (two) times daily as needed for muscle spasms. Qty: 60 tablet, Refills: 5    feeding supplement, ENSURE ENLIVE, (ENSURE ENLIVE) LIQD Take 237 mLs by mouth 2 (two) times daily between meals. Qty: 237 mL, Refills: 12       Follow-up Information    [facility], [place]. Go on 05/22/2016.   Specialty:  General Surgery Why:  your appointment for post-operative follow-up is at 11:15 AM. Please arrive 30 minutes early to get checked in and fill out any necessary paperwork. Contact information: [address] [place] [address] [phone]          TOTAL DISCHARGE TIME: 35 mins  [facility]  [facility] Pager [phone]  05/09/2016, 2:30 PM

## 2016-05-09 NOTE — Progress Notes (Signed)
[facility] Progress Note  1 Day Post-Op  Subjective: POD#1, bleeding overnight from umbilical port site. States had to change bandage 3-4x. Now has an abdominal binder. Reports abdominal soreness around umbilicus. Tolerating PO. Denies N/V. +flatus. Ambulated last night prior to bleeding. Denies weakness/dizziness.  Updated the patients wife over the phone. Objective: Vital signs in last 24 hours: Temp:  [97.6 F (36.4 C)-98.6 F (37 C)] 98.4 F (36.9 C) (09/07 0550) Pulse Rate:  [61-70] 68 (09/07 0601) Resp:  [10-16] 16 (09/07 0550) BP: (90-118)/(58-72) 100/58 (09/07 0601) SpO2:  [94 %-100 %] 95 % (09/07 0550) Last BM Date: 05/06/16  Intake/Output from previous day: 09/06 0701 - 09/07 0700 In: 2241.7 [P.O.:510; I.V.:1581.7; IV Piggyback:150] Out: 1560 [Urine:1530; Blood:30] Intake/Output this shift: No intake/output data recorded.  PE: Gen:  Alert, NAD, pleasant; sitting up eating. Pulm:  CTA, no W/R/R Abd: Soft, appropriately tender, ND, +BS, no HSM, incisions C/D/ - umbilical gauze dressing had no sanguinous drainage, I removed the gauze and saw no active bleeding, some dried blood over steri-strips. Placed clean dressing.  Lab Results:   Recent Labs  05/08/16 0416 05/09/16 0438  WBC 8.5 9.7  HGB 13.8 12.8*  HCT 41.9 38.6*  PLT 196 182   BMET  Recent Labs  05/08/16 0416 05/09/16 0438  NA 138 137  K 4.3 3.9  CL 97* 100*  CO2 29 28  GLUCOSE 100* 117*  BUN <5* 9  CREATININE 1.20 1.15  CALCIUM 8.8* 8.6*   PT/INR  Recent Labs  05/08/16 0416  LABPROT 14.3  INR 1.10   CMP     Component Value Date/Time   NA 137 05/09/2016 0438   K 3.9 05/09/2016 0438   CL 100 (L) 05/09/2016 0438   CO2 28 05/09/2016 0438   GLUCOSE 117 (H) 05/09/2016 0438   BUN 9 05/09/2016 0438   CREATININE 1.15 05/09/2016 0438   CALCIUM 8.6 (L) 05/09/2016 0438   PROT 5.8 (L) 05/09/2016 0438   ALBUMIN 3.4 (L) 05/09/2016 0438   AST 31 05/09/2016 0438   ALT 23  05/09/2016 0438   ALKPHOS 59 05/09/2016 0438   BILITOT 1.2 05/09/2016 0438   GFRNONAA >60 05/09/2016 0438   GFRAA >60 05/09/2016 0438   Lipase     Component Value Date/Time   LIPASE 38 05/05/2016 1405   Studies/Results: Dg Cholangiogram Operative  Result Date: 05/08/2016 CLINICAL DATA:  Cholelithiasis EXAM: INTRAOPERATIVE CHOLANGIOGRAM TECHNIQUE: Cholangiographic images from the C-arm fluoroscopic device were submitted for interpretation post-operatively. Please see the procedural report for the amount of contrast and the fluoroscopy time utilized. COMPARISON:  None. FINDINGS: Contrast fills the biliary tree and duodenum without filling defects in the common bile duct. The common bile duct is mildly prominent. IMPRESSION: Patent biliary tree. Electronically Signed   By: Jolaine Click M.D.   On: 05/08/2016 15:17   Anti-infectives: Anti-infectives    Start     Dose/Rate Route Frequency Ordered Stop   05/06/16 1100  piperacillin-tazobactam (ZOSYN) IVPB 3.375 g     3.375 g 12.5 mL/hr over 240 Minutes Intravenous Every 8 hours 05/06/16 1056       Assessment/Plan Symptomatic cholelithiasis S/p LAPAROSCOPIC CHOLECYSTECTOMY WITH INTRAOPERATIVE CHOLANGIOGRAM, 05/08/16 Dr. Corliss Skains - post-operative bleeding from umbilical site that improved with pressure and replacing dressing; continue to hold plavix; repeat CBC and monitor BP. hgb currently stable. - post-operative abdominal soreness that is appropriate, tolerating PO, +flatus - patient may ambulate wearing abdominal binder and can remove binder at rest  Plan:  repeat CBC, ambulate  Can advance to regular HH diet Re-check this afternoon for possible discharge.   LOS: 4 days    Adam PhenixElizabeth S Erynne Kealey , [facility] 05/09/2016, 10:24 AM Pager: [phone] Consults: [phone] Mon-Fri 7:00 am-4:30 pm Sat-Sun 7:00 am-11:30 am

## 2016-05-09 NOTE — Progress Notes (Signed)
Dr. Milford Cage. Connor went to see the patient, removed existing dressing, applied pressure with gauze to umbilical lap site for several minutes then applied pressure dressing and order for abdominal binder.  Instructed this RN to let the day shift RN to check the umbilical lap site and dressing for complication and drainage.

## 2016-05-09 NOTE — Progress Notes (Signed)
Paged on call [facility] MD, Phylliss Blakeshelsea Connor regarding the noted hematoma around umbilicus lap site which was marked and red drainage on gauze and steri strips. Umbilicus lap site guaze dressing was previously changed x 2 (@ 1954 and 2246) and steri strips reinforced.  Dr. Fredricka Bonineonnor called back and will see the patient.

## 2016-05-09 NOTE — Progress Notes (Signed)
Pt discharged home in stable condition. Discharge education provided with no concerns voiced.  

## 2016-05-09 NOTE — Discharge Instructions (Signed)
DATE TO RESTART PLAVIX: 05/10/16  Your appointment is at 11:15 AM on 05/22/16, please arrive at least 30 min before your appointment to complete your check in paperwork.  If you are unable to arrive 30 min prior to your appointment time we may have to cancel or reschedule you.  LAPAROSCOPIC SURGERY: POST OP INSTRUCTIONS  1. DIET: Follow a light bland diet the first 24 hours after arrival home, such as soup, liquids, crackers, etc. Be sure to include lots of fluids daily. Avoid fast food or heavy meals as your are more likely to get nauseated. Eat a low fat the next few days after surgery.  2. Take your usually prescribed home medications unless otherwise directed. 3. PAIN CONTROL:  1. Pain is best controlled by a usual combination of three different methods TOGETHER:  1. Ice/Heat 2. Over the counter pain medication 3. Prescription pain medication 2. Most patients will experience some swelling and bruising around the incisions. Ice packs or heating pads (30-60 minutes up to 6 times a day) will help. Use ice for the first few days to help decrease swelling and bruising, then switch to heat to help relax tight/sore spots and speed recovery. Some people prefer to use ice alone, heat alone, alternating between ice & heat. Experiment to what works for you. Swelling and bruising can take several weeks to resolve.  3. It is helpful to take an over-the-counter pain medication regularly for the first few weeks. Choose one of the following that works best for you:  1. Naproxen (Aleve, etc) Two 220mg  tabs twice a day 2. Ibuprofen (Advil, etc) Three 200mg  tabs four times a day (every meal & bedtime) 3. Acetaminophen (Tylenol, etc) 500-650mg  four times a day (every meal & bedtime) 4. A prescription for pain medication (such as oxycodone, hydrocodone, etc) should be given to you upon discharge. Take your pain medication as prescribed.  1. If you are having problems/concerns with the prescription medicine (does not  control pain, nausea, vomiting, rash, itching, etc), please call us [phone] to see if we need to switch you to a different pain medicine that will work better for you and/or control your side effect better. 2. If you need a refill on your pain medication, please contact your pharmacy. They will contact our office to request authorization. Prescriptions will not be filled after 5 pm or on week-ends. 4. Avoid getting constipated. Between the surgery and the pain medications, it is common to experience some constipation. Increasing fluid intake and taking a fiber supplement (such as Metamucil, Citrucel, FiberCon, MiraLax, etc) 1-2 times a day regularly will usually help prevent this problem from occurring. A mild laxative (prune juice, Milk of Magnesia, MiraLax, etc) should be taken according to package directions if there are no bowel movements after 48 hours.  5. Watch out for diarrhea. If you have many loose bowel movements, simplify your diet to bland foods & liquids for a few days. Stop any stool softeners and decrease your fiber supplement. Switching to mild anti-diarrheal medications (Kayopectate, Pepto Bismol) can help. If this worsens or does not improve, please call us. 6. Wash / shower every day. You may shower over the dressings as they are waterproof. Continue to shower over incision(s) after the dressing is off. 7. Remove your waterproof bandages 5 days after surgery. You may leave the incision open to air. You may replace a dressing/Band-Aid to cover the incision for comfort if you wish.  8. ACTIVITIES as tolerated:  1. You may resume  regular (light) daily activities beginning the next day--such as daily self-care, walking, climbing stairs--gradually increasing activities as tolerated. If you can walk 30 minutes without difficulty, it is safe to try more intense activity such as jogging, treadmill, bicycling, low-impact aerobics, swimming, etc. 2. Save the most intensive and strenuous  activity for last such as sit-ups, heavy lifting, contact sports, etc Refrain from any heavy lifting or straining until you are off narcotics for pain control.  3. DO NOT PUSH THROUGH PAIN. Let pain be your guide: If it hurts to do something, don't do it. Pain is your body warning you to avoid that activity for another week until the pain goes down. 4. You may drive when you are no longer taking prescription pain medication, you can comfortably wear a seatbelt, and you can safely maneuver your car and apply brakes. 5. You may have sexual intercourse when it is comfortable.  9. FOLLOW UP in our office  1. Please call [facility] at [phone] to set up an appointment to see your surgeon in the office for a follow-up appointment approximately 2-3 weeks after your surgery. 2. Make sure that you call for this appointment the day you arrive home to insure a convenient appointment time.      10. IF YOU HAVE DISABILITY OR FAMILY LEAVE FORMS, BRING THEM TO THE               OFFICE FOR PROCESSING.   WHEN TO CALL US [phone]:  1. Poor pain control 2. Reactions / problems with new medications (rash/itching, nausea, etc)  3. Fever over 101.5 F (38.5 C) 4. Inability to urinate 5. Nausea and/or vomiting 6. Worsening swelling or bruising 7. Continued bleeding from incision. 8. Increased pain, redness, or drainage from the incision  The clinic staff is available to answer your questions during regular business hours (8:30am-5pm). Please dont hesitate to call and ask to speak to one of our nurses for clinical concerns.  If you have a medical emergency, go to the nearest emergency room or call 911.  [facility] from [facility] is always on call at the [facility]   [facility] [place]  [address], [address], [place], [place] [address] ?  MAIN: [phone] ? TOLL FREE: [phone] ?  FAX [phone]  [email or web address]

## 2016-06-25 ENCOUNTER — Other Ambulatory Visit: Payer: Self-pay | Admitting: Surgery

## 2016-07-18 ENCOUNTER — Emergency Department (HOSPITAL_COMMUNITY)
Admission: EM | Admit: 2016-07-18 | Discharge: 2016-07-18 | Disposition: A | Discharge status: Home / Self Care | Payer: Medicare HMO | Attending: Emergency Medicine | Admitting: Emergency Medicine

## 2016-07-18 ENCOUNTER — Encounter (HOSPITAL_COMMUNITY): Payer: Self-pay | Admitting: Emergency Medicine

## 2016-07-18 ENCOUNTER — Emergency Department (HOSPITAL_COMMUNITY): Payer: Medicare HMO

## 2016-07-18 DIAGNOSIS — I509 Heart failure, unspecified: Secondary | ICD-10-CM | POA: Insufficient documentation

## 2016-07-18 DIAGNOSIS — I2510 Atherosclerotic heart disease of native coronary artery without angina pectoris: Secondary | ICD-10-CM | POA: Diagnosis not present

## 2016-07-18 DIAGNOSIS — R569 Unspecified convulsions: Secondary | ICD-10-CM

## 2016-07-18 DIAGNOSIS — Z8673 Personal history of transient ischemic attack (TIA), and cerebral infarction without residual deficits: Secondary | ICD-10-CM | POA: Insufficient documentation

## 2016-07-18 DIAGNOSIS — Z87891 Personal history of nicotine dependence: Secondary | ICD-10-CM | POA: Insufficient documentation

## 2016-07-18 DIAGNOSIS — I110 Hypertensive heart disease with heart failure: Secondary | ICD-10-CM | POA: Diagnosis not present

## 2016-07-18 DIAGNOSIS — Z955 Presence of coronary angioplasty implant and graft: Secondary | ICD-10-CM | POA: Diagnosis not present

## 2016-07-18 LAB — CBC
HCT: 35.6 % — ABNORMAL LOW (ref 39.0–52.0)
Hemoglobin: 12.1 g/dL — ABNORMAL LOW (ref 13.0–17.0)
MCH: 28.4 pg (ref 26.0–34.0)
MCHC: 34 g/dL (ref 30.0–36.0)
MCV: 83.6 fL (ref 78.0–100.0)
PLATELETS: 169 10*3/uL (ref 150–400)
RBC: 4.26 MIL/uL (ref 4.22–5.81)
RDW: 13.9 % (ref 11.5–15.5)
WBC: 7.2 10*3/uL (ref 4.0–10.5)

## 2016-07-18 LAB — BASIC METABOLIC PANEL
Anion gap: 7 (ref 5–15)
BUN: 7 mg/dL (ref 6–20)
CALCIUM: 8.3 mg/dL — AB (ref 8.9–10.3)
CO2: 28 mmol/L (ref 22–32)
CREATININE: 0.94 mg/dL (ref 0.61–1.24)
Chloride: 101 mmol/L (ref 101–111)
GFR calc non Af Amer: >60 mL/min (ref >60)
Glucose, Bld: 88 mg/dL (ref 65–99)
Potassium: 3.2 mmol/L — ABNORMAL LOW (ref 3.5–5.1)
SODIUM: 136 mmol/L (ref 135–145)

## 2016-07-18 LAB — I-STAT TROPONIN, ED: TROPONIN I, POC: 0 ng/mL (ref 0.00–0.08)

## 2016-07-18 LAB — CBG MONITORING, ED: Glucose-Capillary: 85 mg/dL (ref 65–99)

## 2016-07-18 MED ORDER — HYDROMORPHONE HCL 2 MG/ML IJ SOLN
1.0000 mg | Freq: Once | INTRAMUSCULAR | Status: DC
  Filled 2016-07-18: qty 1

## 2016-07-18 MED ORDER — POTASSIUM CHLORIDE CRYS ER 20 MEQ PO TBCR
40.0000 meq | EXTENDED_RELEASE_TABLET | Freq: Once | ORAL | Status: AC
  Administered 2016-07-18: 40 meq via ORAL
  Filled 2016-07-18: qty 2

## 2016-07-18 MED ORDER — HYDROMORPHONE HCL 2 MG/ML IJ SOLN
1.0000 mg | Freq: Once | INTRAMUSCULAR | Status: AC
Start: 2016-07-18 — End: 2016-07-18
  Administered 2016-07-18: 1 mg via INTRAMUSCULAR
  Filled 2016-07-18: qty 1

## 2016-07-18 NOTE — ED Notes (Signed)
Pt c/o continued pain at IV site. 250 cc saline bag to flush. No swelling, redness noted. Blood returns Pharmacy contacted and saline flush appropriate. Dr. Erin HearingMessner aware.

## 2016-07-18 NOTE — ED Provider Notes (Signed)
[facility]-EMER[facility]Y DEPT Provider Note   CSN: [id number] Arrival date & time: 07/18/16  1200     History   Chief Complaint Chief Complaint  Patient presents with  . Seizures  . Chest Pain    HPI Justin ChimesGeorge F Wells is a 65 y.o. male.   Seizures   This is a recurrent problem. The current episode started 1 to 2 hours ago. The problem has been gradually improving. There was 1 seizure. Associated symptoms include confusion, visual disturbances and chest pain. Pertinent negatives include no sleepiness and no cough. Characteristics include rhythmic jerking and loss of consciousness. The episode was witnessed. The seizures did not continue in the ED. The seizure(s) had no focality.    Past Medical History:  Diagnosis Date  . Anxiety   . Arthritis    "hands; back; legs" (04/18/2016)  . Barrett's esophagus   . Cerebrovascular disease   . CHF (congestive heart failure) (HCC)    "said I had this a few years ago; it went away" (04/18/2016)  . Chronic back pain   . Coronary artery disease   . Depression   . Gastroesophageal reflux   . H/O hiatal hernia   . WUJWJXBJ(478.2Headache(784.0)    "probably monthly" (01/27/2013)  . Hyperlipidemia   . Hypertension   . Peripheral arterial disease (HCC)   . Seizures (HCC)    "daily the last 5 days" (04/18/2016)  . Stroke [facility]HCC) 2009  2010   "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, weak right leg and hand; I've had 2 or 3 strokes; can't remember the dates" (01/27/2013)    Patient Active Problem List   Diagnosis Date Noted  . Focal motor seizure disorder (HCC)   . Calculus of gallbladder without cholecystitis without obstruction 05/05/2016  . Epigastric pain   . Spells   . Decreased responsiveness 04/12/2015  . Seizure (HCC)   . Acute prostatitis 06/20/2014  . Urgency of micturation 06/20/2014  . Anxiety 04/12/2014  . Chronic cervical pain 04/12/2014  . Panic attack 04/12/2014  . Chest pain 03/08/2014  . PAD (peripheral artery disease) (HCC) 03/08/2014    . Anxiety state 03/08/2014  . Chronic pain syndrome 03/08/2014  . TIA (transient ischemic attack) 03/07/2014  . Burning or prickling sensation 01/06/2014  . Temporary cerebral vascular dysfunction 01/06/2014  . Cerebral infarct (HCC) 01/06/2014  . CVA (cerebral infarction) 12/29/2013  . Hypertension 12/28/2013  . Other and unspecified hyperlipidemia 12/28/2013  . Brainstem infarct not seen on MRI 12/27/2013  . Bad memory 11/18/2013  . Special screening for malignant neoplasm of prostate 11/18/2013  . Ache in joint 07/12/2013  . Clinical depression 03/09/2013  . Generalized OA 03/09/2013  . Numbness and tingling 01/12/2013  . Peripheral vascular disease, unspecified 01/12/2013  . Pain in limb 01/12/2013  . Atherosclerosis of native arteries of the extremities with intermittent claudication 01/12/2013  . Hypoglycemia 11/05/2012  . Atherosclerosis of native artery of extremity (HCC) 11/05/2012  . Atherosclerosis of coronary artery 07/04/2011  . Artery disease, cerebral 07/04/2011  . Coronary artery disease involving native coronary artery of native heart without angina pectoris 07/04/2011  . Acid reflux 05/31/2010    Past Surgical History:  Procedure Laterality Date  . ABDOMINAL AORTAGRAM N/A 01/26/2013   Procedure: ABDOMINAL Ronny FlurryAORTAGRAM;  Surgeon: Nada LibmanVance W Brabham, MD;  [facility];  Service: Cardiovascular;  Laterality: N/A;  . CARDIAC CATHETERIZATION    . CHOLECYSTECTOMY N/A 05/08/2016   Procedure: LAPAROSCOPIC CHOLECYSTECTOMY WITH INTRAOPERATIVE CHOLANGIOGRAM;  Surgeon: Manus RuddMatthew Tsuei, MD;  Location:  [facility] OR;  Service: General;  Laterality: N/A;  . COLONOSCOPY    . CORONARY ANGIOPLASTY WITH STENT PLACEMENT  11/02/2007   "2" (01/27/2013)  . DIAGNOSTIC LAPAROSCOPY     "twice after nissen; it kept coming apart " (01/27/2013)  . ESOPHAGOGASTRODUODENOSCOPY    . FEMORAL ARTERY STENT Bilateral 01/26/2013  . HERNIA REPAIR    . LAPAROSCOPIC GASTRIC BANDING  2010  . LAPAROSCOPIC  NISSEN FUNDOPLICATION  ?2001  . LEFT HEART CATHETERIZATION WITH CORONARY ANGIOGRAM N/A 03/09/2014   Procedure: LEFT HEART CATHETERIZATION WITH CORONARY ANGIOGRAM;  Surgeon: Iran Ouch, MD;  Location: [facility] CATH LAB;  Service: Cardiovascular;  Laterality: N/A;  . LOWER EXTREMITY ANGIOGRAM Left 03/02/2013   Procedure: LOWER EXTREMITY ANGIOGRAM;  Surgeon: Nada Libman, MD;  Location[facility] C[facility] LAB;  Service: Cardiovascular;  Laterality: Left;       Home Medications    Prior to Admission medications   Medication Sig Start Date End Date Taking? Authorizing Provider  clopidogrel (PLAVIX) 75 MG tablet TAKE ONE TABLET BY MOUTH ONCE DAILY 06/26/16  Yes Maeola Harman, MD  esomeprazole (NEXIUM) 40 MG capsule Take 40 mg by mouth at bedtime.  07/01/14 07/18/16 Yes Historical Provider, MD  Homeopathic Products (LEG CRAMP RELIEF PO) Take 2-4 tablets by mouth as needed (for leg cramps).   Yes Historical Provider, MD  HYDROmorphone (DILAUDID) 4 MG tablet Take 4 mg by mouth every 6 (six) hours as needed (for breakthrough pain).  04/03/15  Yes Historical Provider, MD  levETIRAcetam (KEPPRA) 750 MG tablet Take 1 tablet (750 mg total) by mouth 2 (two) times daily. 04/21/16  Yes Richarda Overlie, MD  lisinopril (PRINIVIL,ZESTRIL) 5 MG tablet Take 1 tablet (5 mg total) by mouth daily. 12/31/13  Yes Daniel J Angiulli, PA-C  LORazepam (ATIVAN) 0.5 MG tablet Take 0.5 mg by mouth 2 (two) times daily.    Yes Historical Provider, MD  morphine (MS CONTIN) 15 MG 12 hr tablet Take 15 mg by mouth 2 (two) times daily. 03/26/16  Yes Historical Provider, MD  MYRBETRIQ 25 MG TB24 tablet Take 25 mg by mouth daily. 07/11/16  Yes Historical Provider, MD  nitroGLYCERIN (NITROSTAT) 0.4 MG SL tablet Place 0.4 mg under the tongue every 5 (five) minutes as needed for chest pain.   Yes Historical Provider, MD  PARoxetine (PAXIL) 20 MG tablet Take 1 tablet (20 mg total) by mouth every morning. 12/31/13  Yes Daniel J Angiulli, PA-C    polyethylene glycol (MIRALAX / GLYCOLAX) packet Take 17 g by mouth daily as needed for mild constipation. 05/09/16  Yes Osvaldo Shipper, MD  tamsulosin (FLOMAX) 0.4 MG CAPS capsule Take 0.4 mg by mouth daily. 07/11/16  Yes Historical Provider, MD    Family History Family History  Problem Relation Age of Onset  . Heart disease Mother     Heart Disease before age 79  . Hypertension Mother   . Heart attack Mother   . Heart attack Father     Social History Social History  Substance Use Topics  . Smoking status: Former Smoker    Packs/day: 2.00    Years: 35.00    Types: Cigarettes    Quit date: 01/29/2000  . Smokeless tobacco: Never Used  . Alcohol use No     Comment: 04/18/2016  "quit drinking in 07/1999; I'm a recovering alcoholic"     Allergies   Patient has no known allergies.   Review of Systems Review of Systems  Eyes: Positive for visual disturbance.  Respiratory:  Negative for cough and shortness of breath.   Cardiovascular: Positive for chest pain. Negative for palpitations and leg swelling.  Genitourinary: Negative for flank pain.  Neurological: Positive for seizures and loss of consciousness.  Psychiatric/Behavioral: Positive for confusion.  All other systems reviewed and are negative.    Physical Exam Updated Vital Signs BP 134/77 (BP Location: Left Arm)   Pulse (!) 57   Temp 98.5 F (36.9 C) (Oral)   Resp 12   SpO2 95%   Physical Exam  Constitutional: He is oriented to person, place, and time. He appears well-developed and well-nourished.  HENT:  Head: Normocephalic and atraumatic.  Eyes: Conjunctivae and EOM are normal.  Neck: Normal range of motion.  Cardiovascular: Normal rate.  Exam reveals no gallop and no friction rub.   Pulmonary/Chest: Effort normal. No respiratory distress. He has no rales.  Abdominal: Soft. He exhibits no distension.  Musculoskeletal: Normal range of motion.  Neurological: He is alert and oriented to person, place, and time.  No cranial nerve deficit.  Slight right facial droop not new, rest of CN's normal.  Upper and lower motor exam normal. Sensation normal.   Skin: Skin is warm and dry.  Nursing note and vitals reviewed.    ED Treatments / Results  Labs (all labs ordered are listed, but only abnormal results are displayed) Labs Reviewed  BASIC METABOLIC PANEL - Abnormal; Notable for the following:       Result Value   Potassium 3.2 (*)    Calcium 8.3 (*)    All other components within normal limits  CBC - Abnormal; Notable for the following:    Hemoglobin 12.1 (*)    HCT 35.6 (*)    All other components within normal limits  CBG MONITORING, ED  I-STAT TROPOININ, ED    EKG  EKG Interpretation  Date/Time:  Thursday July 18 2016 12:01:09 EST Ventricular Rate:  59 PR Interval:    QRS Duration: 114 QT Interval:  458 QTC Calculation: 454 R Axis:   44 Text Interpretation:  Sinus rhythm Borderline intraventricular conduction delay Abnormal R-wave progression, early transition No significant change since last tracing Confirmed by [facility] MD, Barbara CowerJASON [phone]([id number]) on 07/19/2016 7:14:05 AM       Radiology Dg Chest 2 View  Result Date: 07/18/2016 CLINICAL DATA:  Chest discomfort radiating into the left arm. EXAM: CHEST  2 VIEW COMPARISON:  PA and lateral chest 05/05/2016.  CT chest 04/19/2016. FINDINGS: The lungs are clear. Heart size is normal. No pneumothorax or pleural effusion. Calcific atherosclerotic vascular disease is seen. No focal bony abnormality. IMPRESSION: No acute disease. Atherosclerosis. Electronically Signed   By: Drusilla Kannerhomas  Dalessio M.D.   On: 07/18/2016 13:11    Procedures Procedures (including critical care time)  Medications Ordered in ED Medications  potassium chloride SA (K-DUR,KLOR-CON) CR tablet 40 mEq (40 mEq Oral Given 07/18/16 1401)  HYDROmorphone (DILAUDID) injection 1 mg (1 mg Intramuscular Given 07/18/16 1533)     Initial Impression / Assessment and Plan / ED Course    I have reviewed the triage vital signs and the nursing notes.  Pertinent labs & imaging results that were available during my care of the patient were reviewed by me and considered in my medical decision making (see chart for details).  Clinical Course     Patient with breakthrough seizure. No recent illnesses or medication changes to suggest that is the cause. Evaluated and observed in the emergency department for a couple hours and family and patient think  he is at baseline. Chest pain similar to his chronic chest pain that is unchanged.  Plan for discharge home with neurology follow up.    Final Clinical Impressions(s) / ED Diagnoses   Final diagnoses:  Seizure [facility])    New Prescriptions Discharge Medication List as of 07/18/2016  3:33 PM       Marily Memos, MD 07/19/16 [phone]

## 2016-07-18 NOTE — ED Triage Notes (Signed)
Pt via [facility] with c/o chest discomfort that is now radiating to his left arm, neck, and back.  Pt additionally reports having a seizure while in the grocery store where EMS was called.  Hx of seizure and MI.  EMS reports pt was postictal on scene and is now A&Ox4.  Pt given 324 mg aspirin.  NAD.

## 2016-07-18 NOTE — ED Notes (Signed)
Patient transported to X-ray 

## 2016-09-18 IMAGING — RF DG CHOLANGIOGRAM OPERATIVE
1 series · 5 of 5 positions shown · non-contrast
Comparison: None.

CLINICAL DATA: Cholelithiasis

EXAM:
INTRAOPERATIVE CHOLANGIOGRAM
TECHNIQUE: Cholangiographic images from the C-arm fluoroscopic device were
submitted for interpretation post-operatively. Please see the
procedural report for the amount of contrast and the fluoroscopy
time utilized.

[Series 1: run · 2 acquisitions, 5 frames shown]
[im 1/2]
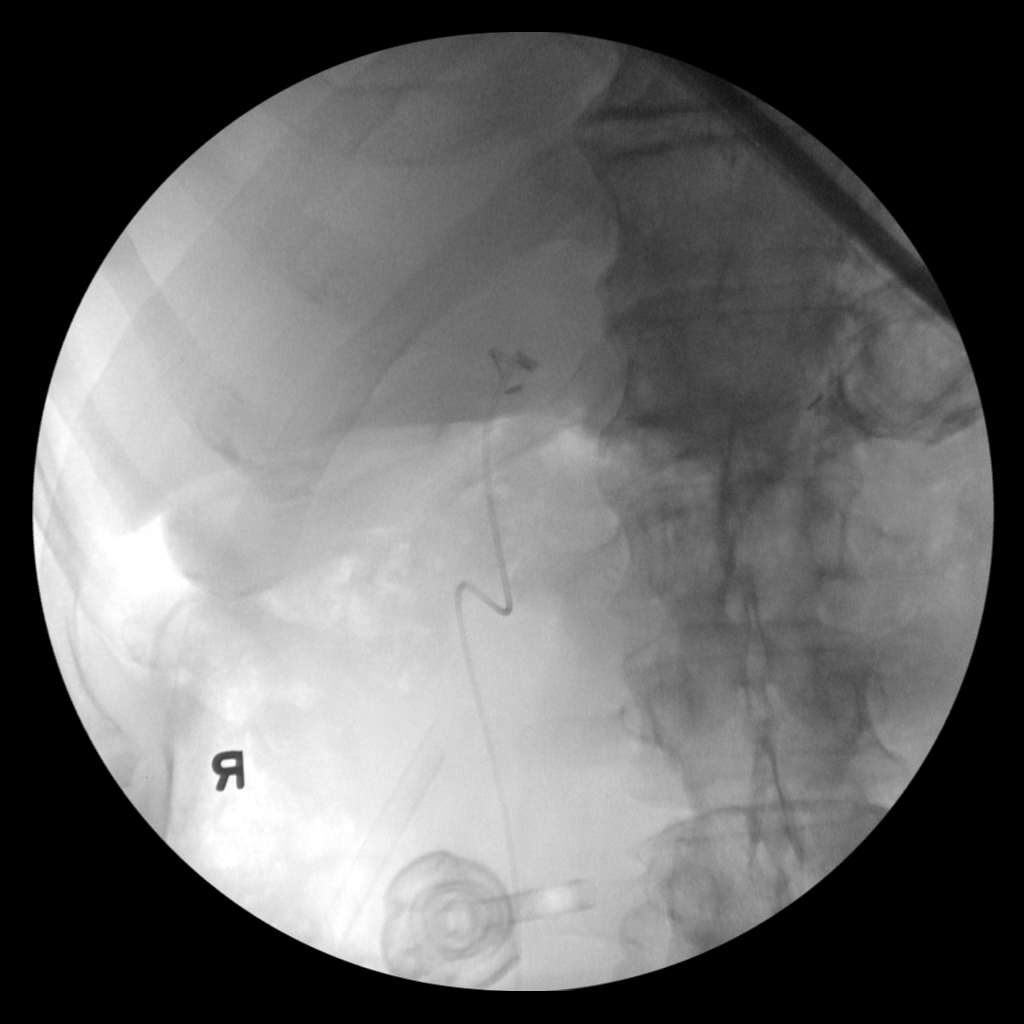
[im 1/2]
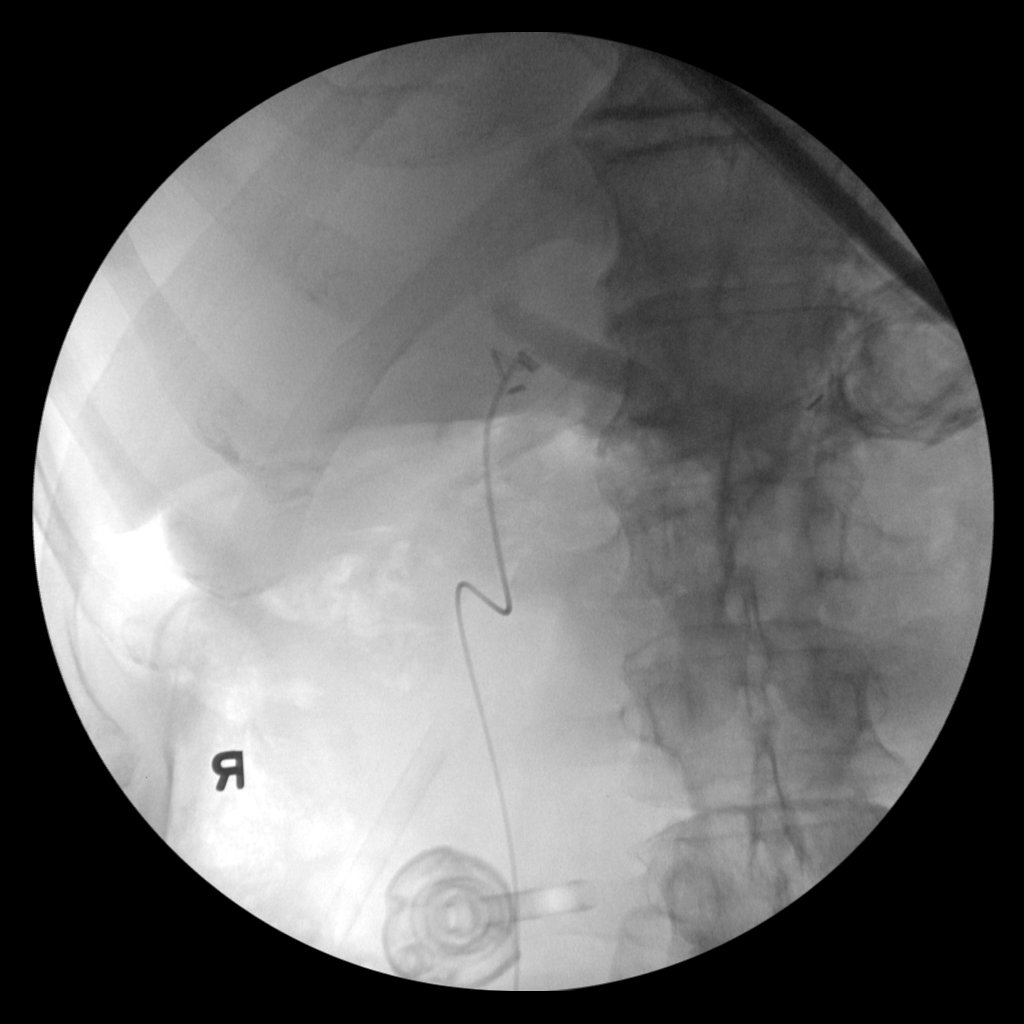
[im 1/2]
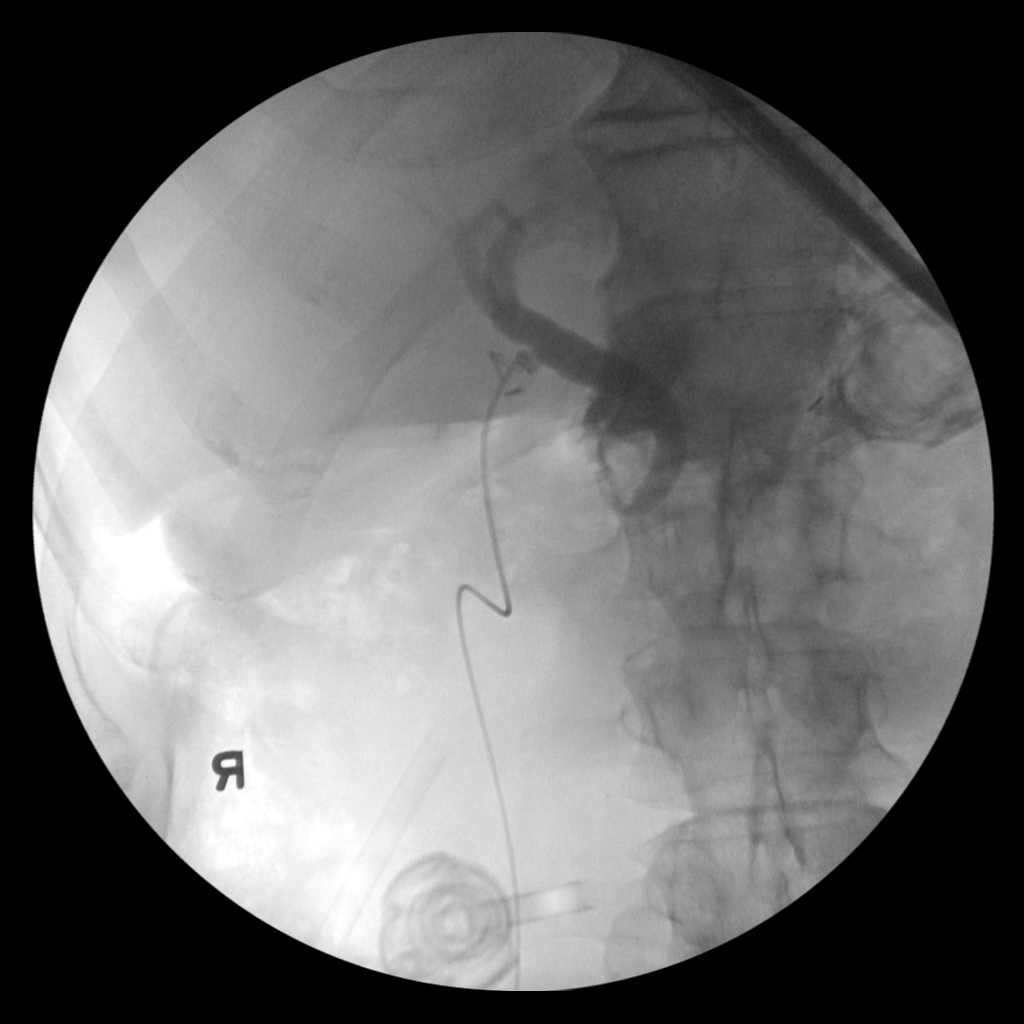
[im 1/2]
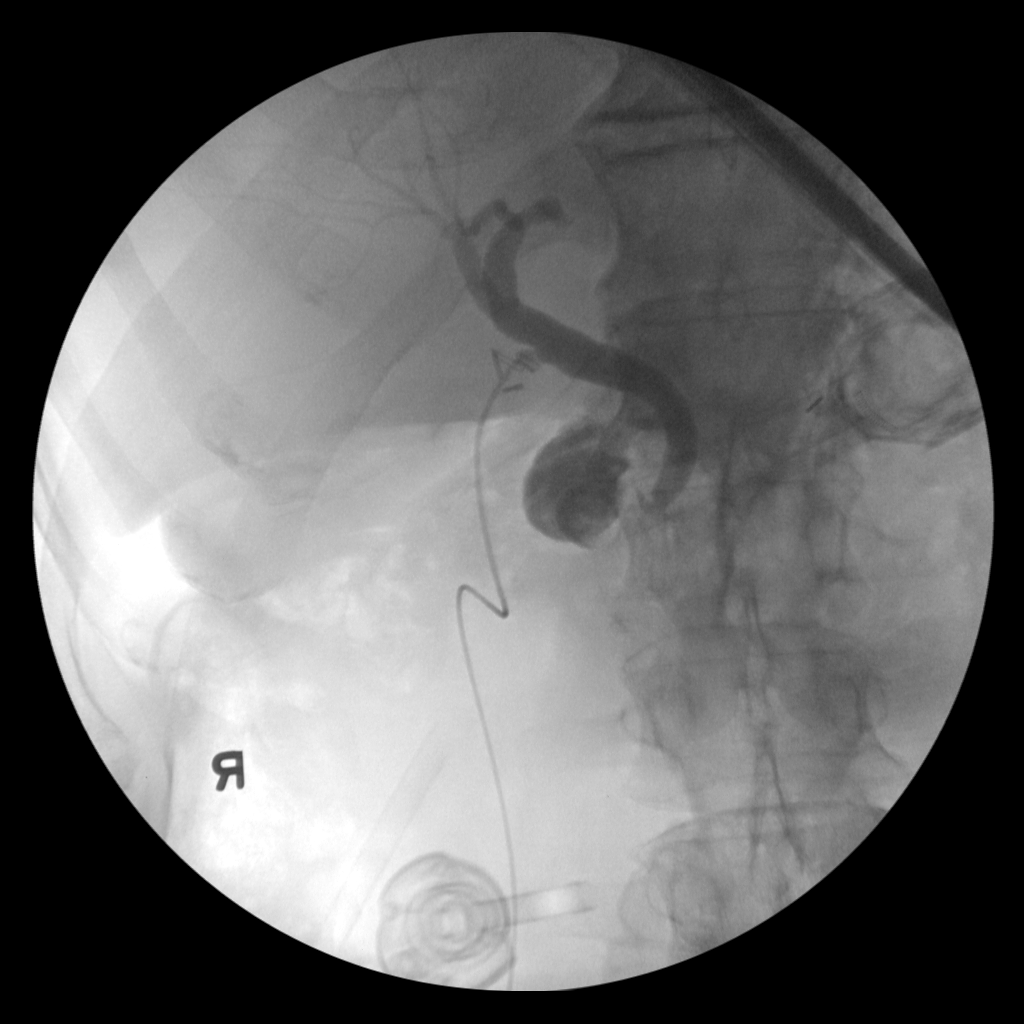
[im 2/2]
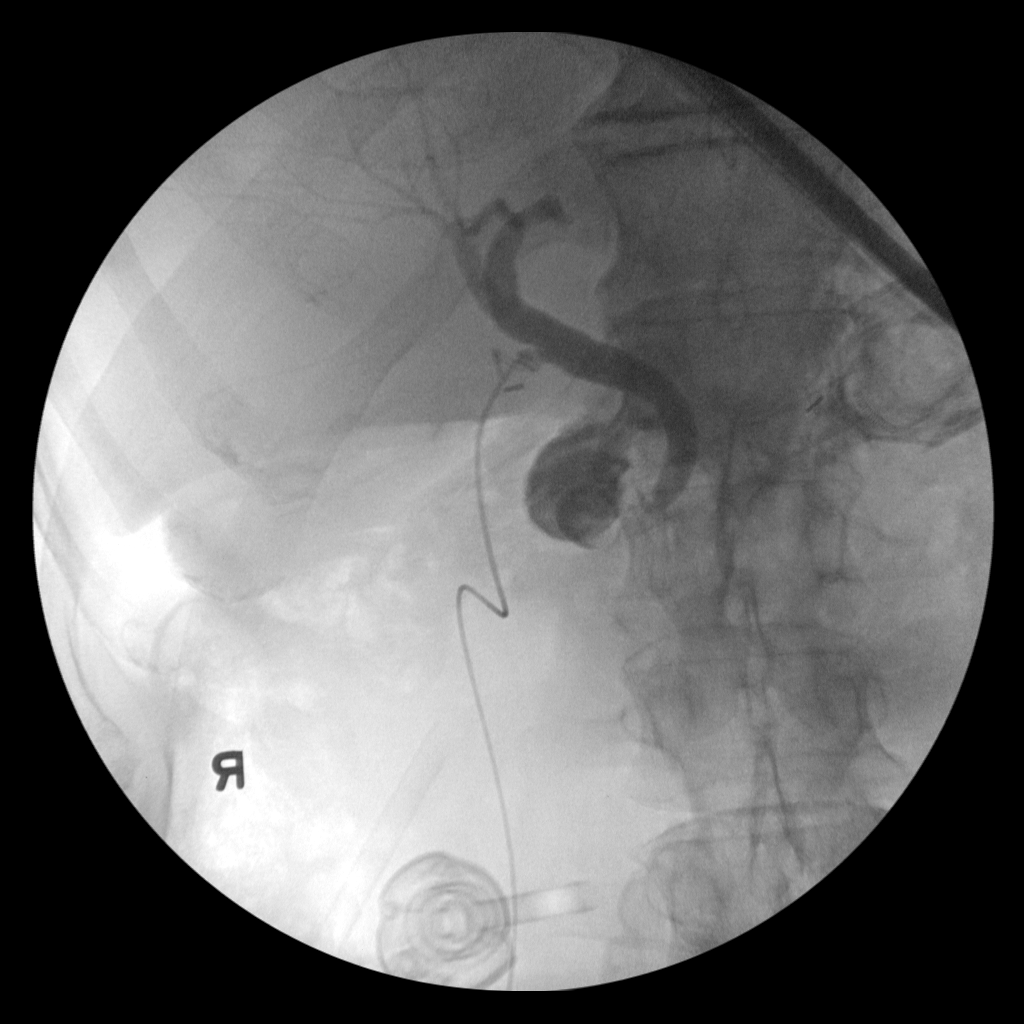

[5 of 5 positions shown; findings below may reference images not displayed]

FINDINGS: Contrast fills the biliary tree and duodenum without filling defects
in the common bile duct. The common bile duct is mildly prominent.
IMPRESSION: Patent biliary tree.

## 2016-10-17 ENCOUNTER — Ambulatory Visit: Payer: Medicare Other | Admitting: Neurology

## 2016-10-22 ENCOUNTER — Encounter: Payer: Self-pay | Admitting: Neurology

## 2016-10-31 ENCOUNTER — Telehealth: Payer: Self-pay

## 2016-10-31 ENCOUNTER — Ambulatory Visit (INDEPENDENT_AMBULATORY_CARE_PROVIDER_SITE_OTHER): Payer: Medicare HMO | Admitting: Neurology

## 2016-10-31 ENCOUNTER — Encounter: Payer: Self-pay | Admitting: Neurology

## 2016-10-31 VITALS — BP 121/62 | HR 70 | Wt 206.2 lb

## 2016-10-31 DIAGNOSIS — R6889 Other general symptoms and signs: Secondary | ICD-10-CM | POA: Diagnosis not present

## 2016-10-31 MED ORDER — LEVETIRACETAM 750 MG PO TABS: 750.0000 mg | ORAL_TABLET | Freq: Two times a day (BID) | ORAL | 1 refills | Status: AC

## 2016-10-31 NOTE — Telephone Encounter (Signed)
Receive call from patients pharmacy [facility]. Pam needs clarification on the keppra dosage. PEr Dr.[name] pt will take 1 whole 750mg  of keppra and 1/2 of keppra in the am. At nght pt will take 1 whole 750mg  keppra and 1/2 keppra in the pm. Pam verbalized understanding, and will write new directions on the pill bottle.

## 2016-10-31 NOTE — Progress Notes (Signed)
PATIENT: Justin ChimesGeorge F Drumwright DOB: 11/06/1950  REASON FOR VISIT: routine follow up for stroke HISTORY FROM: patient  HISTORY OF PRESENT ILLNESS: Justin Wells is an 66 y.o. male with a past medical history significant for HTN, hyperlipidemia, CAD s/p stent deployment x 3, MI, PAD s/p femoral stenting, TIA's, ischemic stroke in the setting of acute MI 2014, [facility] as a code stroke due to acute onset of right hemiparesis, right facial droop 12/27/2013 at 1100. He said that he was home sitting in a couch when started feeling weak all over, developed severe dizziness " like room spinning" and then ambulance was called. According to patient and EMS, weakness of the right arm-leg and face as well as chest pain developed while in the truck. No double vision, difficulty swallowing, slurred speech, language or vision impairment. Chest pain radiated to his shoulders and back. NIHSS 4, BUT WITH SOME INCONSISTENCIES (1 for sensory, 1 for right arm weakness, 2 for right arm weakness). CT brain showed a subtle decreased attenuation in the lower right mid brain and upper right pons, concerning for acute ischemia in this area. CT angio head and neck showed atherosclerotic changes but no high grade stenosis.  Patient underwent CTA chest that showed no evidence of dissection or PE. He takes plavix daily. Patient was administered TPA; it was delayed due to CP and concern for aortic dissection/PE. He was admitted to the neuro ICU for further evaluation and treatment.  Imaging at that time did not confirm acute infarct. He was believed to have a left brain posterior circulation small infarct not visualized on MRI.   Patient returned to hospital on 01/14/14 after noting a sudden onset of posterior neck discomfort, vertigo, blurred vision and bilateral LE weakness.  EMS arrived at house and transported [facility] as a Code stroke. On arrival patient states he did have history of migraine HA which are very  similar in nature with HA, blurred vision and bilateral LE weakness. Initial CT head was negative.  MRI was negative for acute infarct.  He was diagnosed with Complicated Migraine and his Gabapentin was started at 300 mg tid as a preventative for Migraine.  Since discharge in April, he has right-sided weakness and numbness in the right hand and foot.  He states his hearing is worse in his right ear.  He has continued to have spells where he feels hot around his head and neck, will have sharp pain in the neck, with or without headache, and feels weak in his legs. During these episodes wife states he is slow to respond.  His wife states that when this happens, he must go lie down for several hours.  These episodes are occuring every 2-3 days.  He continues to take Plavix and aspirin daily.  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  He is not driving.  Wife states he was ordered outpatient PT but he cannot afford to go.  Wife is on permanent disability.  Blood pressure is well controlled. Hgba1c 5.4. LDL 85  Urgent Revisit 04/08/2014 (PS): Patient is seen today urgently following repeat hospital admission last month. He presented with right arm weakness, chest pain, tremors and became diaphoretic. Code stroke was called the patient's neurological exam had functional features. CT head was unremarkable. Subsequent MRI scan of the brain was obtained which showed no acute infarct. Patient complained of severe neck pain and right-sided numbness. He was asked to wear  a cervical collar. He was seen by Dr. [name] and thought to have anxiety panic attacks. He was started on clonazepam 0.25 mg twice daily and Topamax 50 mg daily. Patient feels that he still has neck pain when he moves his neck and feels better after wearing the neck collar. He still has some trouble walking and uses a cane and feels that his right side of the body is numb. He had CT angiogram of the chest, abdomen and pelvis during the hospitalization  which showed no significant abnormalities as well. He outpatient EEG done on 6/9/5 which was read by me and normal.  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 He found no relief with Topamax and discontinued. He continues to have severe neck pain, worse when turning head towards the left side. Pain is affecting his quality of life. He continues to have left sided numbness, and is using a cane at all times. Update 10/31/2016 :  He returns for follow-up after last visit with me more than 2 years ago. He is accompanied by his wife. He continues to have recurrence to her typical episodes which now occur at a frequency of once or twice a week. There are no specific triggers for the episodes. The wife states in some of these is sugar is low in the 40s but on other episodes at has been in the 90s. Patient starts that feeling disoriented occasionally having chest pain. His skin becomes cold and Plavix and clammy and he has increased sweating. He appears to be sleepy but he can be aroused and can answer. He feels hungry and wants to eat. He feels tired after the episode and notices increasing right-sided weakness. Episode may last for several minutes to hours. Patient has per my prior records has tried Topamax and gabapentin and his episodes without benefit and more recently he was started on Keppra and is currently taking 750 mg twice daily which is tolerating well without side effects. He has been to the emergency room in the last few years several times and has an MRI scan of the brain done twice which showed no acute abnormality. He had a couple of EEGs done as well which did not show any seizure activity. The patient is also cannot tell me if he had prolonged cardiac monitoring for these episodes. He is been having some nocturnal incontinence and plans to see a urologist soon. His tolerating Plavix  well without bleeding or bruising. Blood pressure is well controlled. REVIEW OF SYSTEMS: Full 14 system review of systems performed and notable only for:  Hearing loss, memory loss, dizziness, numbness, weakness, passing out, back pain, neck pain and all other systems negative ALLERGIES: No Known Allergies  HOME MEDICATIONS: Outpatient Prescriptions Prior to Visit  Medication Sig Dispense Refill  . aspirin EC 81 MG tablet Take 81 mg by mouth daily.    Marland Kitchen atorvastatin (LIPITOR) 80 MG tablet Take 1 tablet (80 mg total) by mouth daily at 6 (six) AM. 30 tablet 0  . clonazePAM (KLONOPIN) 0.5 MG tablet Take 0.5 tablets (0.25 mg total) by mouth 2 (two) times daily. 60 tablet 0  . clopidogrel (PLAVIX) 75 MG tablet TAKE ONE TABLET BY MOUTH ONCE DAILY 30 tablet 6  . fish oil-omega-3 fatty acids 1000 MG capsule Take 1 g by mouth 2 (two) times daily.     Marland Kitchen lisinopril (PRINIVIL,ZESTRIL) 5  MG tablet Take 1 tablet (5 mg total) by mouth daily. 30 tablet 1  . Multiple Vitamins-Minerals (ONE-A-DAY MENS VITACRAVES PO) Take 1 tablet by mouth daily.     . nitroGLYCERIN (NITROSTAT) 0.4 MG SL tablet Place 0.4 mg under the tongue every 5 (five) minutes as needed for chest pain.    Marland Kitchen PARoxetine (PAXIL) 20 MG tablet Take 1 tablet (20 mg total) by mouth every morning. 30 tablet 1  . topiramate (TOPAMAX) 50 MG tablet Take 1 tablet (50 mg total) by mouth 2 (two) times daily. 60 tablet 2  . oxyCODONE-acetaminophen (PERCOCET/ROXICET) 5-325 MG per tablet Take 1 tablet by mouth every 6 (six) hours as needed for moderate pain.    . cyclobenzaprine (FLEXERIL) 10 MG tablet Take 5 mg by mouth 2 (two) times daily as needed for muscle spasms.     . ranitidine (ZANTAC) 150 MG tablet Take 1 tablet (150 mg total) by mouth 2 (two) times daily. 60 tablet 1   Meds ordered this encounter  Medications  . esomeprazole (NEXIUM) 40 MG capsule    Sig: Take 40 mg by mouth.  . Oxycodone HCl 10 MG TABS    Sig: 1/2-1 PO Q 6 HR PRN SEVERE PAIN      PHYSICAL EXAM Vitals:   10/31/16 1606  BP: 121/62  Pulse: 70  Weight: 206 lb 3.2 oz (93.5 kg)   Body mass index is 29.59 kg/m.  Generalized: Well developed middle aged Caucasian male, in no acute distress   Head: normocephalic and atraumatic.    Neck: Supple, no carotid bruits   Cardiac: Regular rate rhythm, no murmur   Musculoskeletal: No deformity,   Neurological examination   Mentation: Alert oriented to time, place, history taking. Follows all commands speech and language fluent Cranial nerve II-XII:  Pupils were equal round reactive to light extraocular movements were full, visual field were full on confrontational test. Facial sensation and strength were normal, except mild right lower facial weakness.  Hearing was decreased to finger rubbing on the right. Uvula tongue midline. Head turning  Limited by pain.  Shoulder shrug decreased on the right.  Tongue protrusion into cheek strength was normal. Motor: The motor testing reveals 5/5 strength of left-sided extremities. Right upper extremity 3/5 with give away effort. Right lower extremity 4/5 strength.  Good symmetric motor tone is noted throughout. Moderate weakness of the right grip and intrinsic hand muscles but poor effort. Sensory: Sensory testing is decreased to soft touch, and pinprick  And vibration on right-hemibody with splitting the midline including the forehead   Coordination: Cerebellar testing reveals good finger-nose-finger and heel-to-shin bilaterally.   Gait and station: Gait is antalgic. Tandem gait is unsteady. Romberg is negative.  Reflexes: Deep tendon reflexes are symmetric and normal bilaterally.     ASSESSMENT: Mr. JAKHAI FANT is a 66 y.o. male presenting with acute onset right hemiparesis on 12/27/13, status post IV t-PA. Imaging confirmed no acute infarct. Dx: left brain posterior circulation small infarct not visualized on MRI versus TIA. Given history of sweating episodes w/ neuro changes that  last 24h, concern for posterior circulation TIAs   Versus seizures are cardiac arrhythmias. He has failed Topamax and gabapentin in the past and more recently is on Keppra for these episodes. I had a long discussion with the patient and his wife regarding his recurrent stereotypical episodes of increased sweating, near fainting followed by mild right-sided weakness and occasional chest pain of unclear etiology. I recommend checking 30 day heart  monitor. Cardiac arrhythmias as well as check CT angiogram of the brain and neck and EEG. Increase Keppra dose to 750 mg 1-1/2 tablets twice daily. Patient is neurologically cleared to stop the Plavix 3-5 days prior to scheduled colonoscopy with a small but acceptable periprocedural   risk of TIA/stroke if okay with the patient. Greater than 50% time during this 30 minute visit was spent on counseling and coordination of care about his episodes, TIAs, seizures and answered questions He will return for follow-up in 2 months or call earlier if necessary   Delia Heady, MD 10/31/2016, 4:54 PM [facility] [address] [place], [place] [address] [phone]  Note: This document was prepared with digital dictation and possible smart phrase technology. Any transcriptional errors that result from this process are unintentional.

## 2016-10-31 NOTE — Patient Instructions (Addendum)
I had a long discussion with the patient and his wife regarding his recurrent stereotypical episodes of increased sweating, near fainting followed by mild right-sided weakness and occasional chest pain of unclear etiology. I recommend checking 30 day heart monitor. Cardiac arrhythmias as well as check CT angiogram of the brain and neck and EEG. Increase Keppra dose to 750 mg 1-1/2 tablets twice daily. Patient is neurologically cleared to stop the Plavix 3-5 days prior to scheduled colonoscopy with a small but acceptable periprocedural   risk of TIA/stroke if okay with the patient. He will return for follow-up in 2 months or call earlier if necessary

## 2016-11-11 ENCOUNTER — Other Ambulatory Visit: Payer: BC Managed Care – PPO

## 2016-11-11 ENCOUNTER — Telehealth: Payer: Self-pay | Admitting: Neurology

## 2016-11-11 NOTE — Telephone Encounter (Signed)
I got authorization # [id number] CTA head w/wo and [id number] CTA neck w/wo good from 3/12.18 to 02/09/17 auth code [id number]

## 2016-11-11 NOTE — Telephone Encounter (Signed)
Noted, thank you for your help!  °

## 2016-11-11 NOTE — Telephone Encounter (Signed)
[facility] informed me that the MRI didn't approve the MRI's  and is pending peer to peer.. The phone number for the peer to peer is [phone] and the case number is [id number] . [facility]

## 2016-11-15 ENCOUNTER — Ambulatory Visit
Admission: RE | Admit: 2016-11-15 | Discharge: 2016-11-15 | Disposition: A | Discharge status: Home / Self Care | Payer: Medicare HMO | Source: Ambulatory Visit | Attending: Neurology | Admitting: Neurology

## 2016-11-15 ENCOUNTER — Other Ambulatory Visit: Payer: Self-pay

## 2016-11-15 ENCOUNTER — Other Ambulatory Visit: Payer: Medicare Other

## 2016-11-15 DIAGNOSIS — R6889 Other general symptoms and signs: Secondary | ICD-10-CM

## 2016-11-15 MED ORDER — IOPAMIDOL (ISOVUE-370) INJECTION 76%
75.0000 mL | Freq: Once | INTRAVENOUS | Status: AC | PRN
  Administered 2016-11-15: 75 mL via INTRAVENOUS

## 2016-11-21 ENCOUNTER — Ambulatory Visit (INDEPENDENT_AMBULATORY_CARE_PROVIDER_SITE_OTHER): Payer: Medicare HMO

## 2016-11-21 DIAGNOSIS — R2990 Unspecified symptoms and signs involving the nervous system: Secondary | ICD-10-CM | POA: Diagnosis not present

## 2016-11-21 DIAGNOSIS — R6889 Other general symptoms and signs: Secondary | ICD-10-CM

## 2016-12-02 ENCOUNTER — Telehealth: Payer: Self-pay | Admitting: *Deleted

## 2016-12-02 NOTE — Telephone Encounter (Signed)
Per Dr [name], LVM informing patient that his CT angiogram of brain and neck shows no major blockages of any blood vessel in neck or brain. Repeated these results and left number for any questions.

## 2016-12-03 ENCOUNTER — Telehealth: Payer: Self-pay

## 2016-12-03 NOTE — Telephone Encounter (Signed)
Rn call patients wife on dpr form that EEG was normal. Pts wife verbalized understanding

## 2016-12-03 NOTE — Telephone Encounter (Signed)
-----   Message from Micki Riley, MD sent at 11/29/2016  2:41 PM EDT ----- Joneen Roach inform patient that EEG study was normal

## 2016-12-03 NOTE — Telephone Encounter (Signed)
Rn call wife about Ct angiogram of brain and neck shows no major blockages of any blood vessel in neck or brain. Pts wife on dpr verbalized understanding.

## 2016-12-10 DIAGNOSIS — I471 Supraventricular tachycardia: Secondary | ICD-10-CM | POA: Insufficient documentation

## 2017-05-01 ENCOUNTER — Other Ambulatory Visit: Payer: Self-pay | Admitting: Vascular Surgery

## 2017-05-05 DIAGNOSIS — K8689 Other specified diseases of pancreas: Secondary | ICD-10-CM | POA: Insufficient documentation

## 2018-03-12 ENCOUNTER — Other Ambulatory Visit: Payer: Self-pay | Admitting: Vascular Surgery

## 2018-10-28 DIAGNOSIS — J439 Emphysema, unspecified: Secondary | ICD-10-CM | POA: Insufficient documentation

## 2018-10-28 DIAGNOSIS — K911 Postgastric surgery syndromes: Secondary | ICD-10-CM | POA: Insufficient documentation

## 2019-05-05 ENCOUNTER — Other Ambulatory Visit: Payer: Self-pay | Admitting: Vascular Surgery

## 2020-03-29 DIAGNOSIS — K760 Fatty (change of) liver, not elsewhere classified: Secondary | ICD-10-CM | POA: Insufficient documentation

## 2021-04-27 ENCOUNTER — Other Ambulatory Visit: Payer: Self-pay | Admitting: *Deleted

## 2021-04-27 ENCOUNTER — Telehealth: Payer: Self-pay

## 2021-04-27 DIAGNOSIS — M25569 Pain in unspecified knee: Secondary | ICD-10-CM

## 2021-04-27 NOTE — Telephone Encounter (Signed)
Pt's spouse called to schedule an appt for today due to pt having BLE pain/weakness x 3 weeks. Pt has not been seen here since 2014. He was seen by his PCP yesterday who advised him to go to the ED at [facility]. Pt was there and left AMA after having studies performed. Spouse states he was in pain and tired of waiting there and them not doing much. I have advised her to return to ED or call pcp so he can review pt's studies and advise them on what to do and he can send a referral to [place] if needed. She is in agreement and verbalized understanding.

## 2021-04-30 ENCOUNTER — Other Ambulatory Visit: Payer: Self-pay

## 2021-04-30 ENCOUNTER — Ambulatory Visit (INDEPENDENT_AMBULATORY_CARE_PROVIDER_SITE_OTHER)
Admission: RE | Admit: 2021-04-30 | Discharge: 2021-04-30 | Disposition: A | Discharge status: Home / Self Care | Payer: Medicare HMO | Source: Ambulatory Visit | Attending: Surgery | Admitting: Surgery

## 2021-04-30 DIAGNOSIS — M25569 Pain in unspecified knee: Secondary | ICD-10-CM

## 2021-05-02 ENCOUNTER — Encounter: Payer: Self-pay | Admitting: Vascular Surgery

## 2021-05-02 ENCOUNTER — Other Ambulatory Visit: Payer: Self-pay

## 2021-05-02 ENCOUNTER — Ambulatory Visit (INDEPENDENT_AMBULATORY_CARE_PROVIDER_SITE_OTHER): Payer: Medicare HMO | Admitting: Vascular Surgery

## 2021-05-02 VITALS — BP 121/67 | HR 60 | Temp 98.0°F | Resp 20 | Ht 70.0 in | Wt 201.8 lb

## 2021-05-02 DIAGNOSIS — Z9889 Other specified postprocedural states: Secondary | ICD-10-CM | POA: Diagnosis not present

## 2021-05-02 DIAGNOSIS — I70229 Atherosclerosis of native arteries of extremities with rest pain, unspecified extremity: Secondary | ICD-10-CM

## 2021-05-02 NOTE — Progress Notes (Signed)
Patient ID: Justin Wells, male   DOB: [birth year], 70 y.o.   MRN: [id number]  Reason for Consult: New Patient (Initial Visit)   Referred by John GiovanniMelvin, Jon K, MD  Subjective:     HPI:  Justin ChimesGeorge F Koke is a 70 y.o. male history of bilateral lower extremity endovascular intervention.  He now states over the past couple weeks he has severe pain in his left lower extremity with dependent edema and rubor.  He is unable to walk to the next room due to pain.  He has had cramping of his foot during sleep requiring him to hang his foot over the side of the bed.  He is a former smoker.  He is taking a statin and Plavix daily.  Past Medical History:  Diagnosis Date   Anxiety    Arthritis    "hands; back; legs" (04/18/2016)   Barrett's esophagus    Cerebrovascular disease    CHF (congestive heart failure) (HCC)    "said I had this a few years ago; it went away" (04/18/2016)   Chronic back pain    Coronary artery disease    Depression    Gastroesophageal reflux    H/O hiatal hernia    Headache(784.0)    "probably monthly" (01/27/2013)   Hyperlipidemia    Hypertension    Peripheral arterial disease (HCC)    Seizures (HCC)    "daily the last 5 days" (04/18/2016)   Stroke [facility](HCC) 2009  2010   "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, weak right leg and hand; I've had 2 or 3 strokes; can't remember the dates" (01/27/2013)   Family History  Problem Relation Age of Onset   Heart disease Mother        Heart Disease before age 68   Hypertension Mother    Heart attack Mother    Heart attack Father    Past Surgical History:  Procedure Laterality Date   ABDOMINAL AORTAGRAM N/A 01/26/2013   Procedure: ABDOMINAL Ronny FlurryAORTAGRAM;  Surgeon: Nada LibmanVance W Brabham, MD;  [facility];  Service: Cardiovascular;  Laterality: N/A;   CARDIAC CATHETERIZATION     CHOLECYSTECTOMY N/A 05/08/2016   Procedure: LAPAROSCOPIC CHOLECYSTECTOMY WITH INTRAOPERATIVE CHOLANGIOGRAM;  Surgeon: Manus RuddMatthew Tsuei, MD;  Location: [facility]  OR;  Service: General;  Laterality: N/A;   COLONOSCOPY     CORONARY ANGIOPLASTY WITH STENT PLACEMENT  11/02/2007   "2" (01/27/2013)   DIAGNOSTIC LAPAROSCOPY     "twice after nissen; it kept coming apart " (01/27/2013)   ESOPHAGOGASTRODUODENOSCOPY     FEMORAL ARTERY STENT Bilateral 01/26/2013   HERNIA REPAIR     LAPAROSCOPIC GASTRIC BANDING  2010   LAPAROSCOPIC NISSEN FUNDOPLICATION  ?2001   LEFT HEART CATHETERIZATION WITH CORONARY ANGIOGRAM N/A 03/09/2014   Procedure: LEFT HEART CATHETERIZATION WITH CORONARY ANGIOGRAM;  Surgeon: Iran OuchMuhammad A Arida, MD;  Location: [facility] CATH LAB;  Service: Cardiovascular;  Laterality: N/A;   LOWER EXTREMITY ANGIOGRAM Left 03/02/2013   Procedure: LOWER EXTREMITY ANGIOGRAM;  Surgeon: Nada LibmanVance W Brabham, MD;  [facility];  Service: Cardiovascular;  Laterality: Left;    Short Social History:  Social History   Tobacco Use   Smoking status: Former    Packs/day: 2.00    Years: 35.00    Pack years: 70.00    Types: Cigarettes    Quit date: 01/29/2000    Years since quitting: 21.2   Smokeless tobacco: Never  Substance Use Topics   Alcohol use: No    Comment: 04/18/2016  "quit  drinking in 07/1999; I'm a recovering alcoholic"    No Known Allergies  Current Outpatient Medications  Medication Sig Dispense Refill   atorvastatin (LIPITOR) 80 MG tablet Take 80 mg by mouth daily.     clopidogrel (PLAVIX) 75 MG tablet Take 1 tablet by mouth once daily 90 tablet 3   DULoxetine (CYMBALTA) 60 MG capsule Take 60 mg by mouth daily.     fluticasone (FLONASE) 50 MCG/ACT nasal spray SMARTSIG:2 Spray(s) Both Nares Every Morning     Homeopathic Products (LEG CRAMP RELIEF PO) Take 2-4 tablets by mouth as needed (for leg cramps).     HYDROmorphone (DILAUDID) 4 MG tablet Take 4 mg by mouth every 6 (six) hours as needed (for breakthrough pain).      isosorbide mononitrate (IMDUR) 60 MG 24 hr tablet Take 60 mg by mouth every morning.     levETIRAcetam (KEPPRA) 750 MG tablet Take 1  tablet (750 mg total) by mouth 2 (two) times daily. Take 1.5 tablets twice daily 60 tablet 1   lisinopril (PRINIVIL,ZESTRIL) 5 MG tablet Take 1 tablet (5 mg total) by mouth daily. 30 tablet 1   LORazepam (ATIVAN) 0.5 MG tablet Take 0.5 mg by mouth 2 (two) times daily.     metoprolol succinate (TOPROL-XL) 25 MG 24 hr tablet Take 25 mg by mouth daily.     mirtazapine (REMERON) 15 MG tablet Take 15 mg by mouth at bedtime.     morphine (MS CONTIN) 15 MG 12 hr tablet Take 15 mg by mouth 2 (two) times daily.     MYRBETRIQ 25 MG TB24 tablet Take 25 mg by mouth daily.     nitroGLYCERIN (NITROSTAT) 0.4 MG SL tablet Place 0.4 mg under the tongue every 5 (five) minutes as needed for chest pain.     PARoxetine (PAXIL) 20 MG tablet Take 1 tablet (20 mg total) by mouth every morning. 30 tablet 1   polyethylene glycol (MIRALAX / GLYCOLAX) packet Take 17 g by mouth daily as needed for mild constipation. 14 each 0   tamsulosin (FLOMAX) 0.4 MG CAPS capsule Take 0.4 mg by mouth daily.     esomeprazole (NEXIUM) 40 MG capsule Take 40 mg by mouth at bedtime.      No current facility-administered medications for this visit.    Review of Systems  Constitutional:  Constitutional negative. HENT: HENT negative.  Eyes: Eyes negative.  Respiratory: Respiratory negative.  Cardiovascular: Cardiovascular negative.  GI: Gastrointestinal negative.  Musculoskeletal: Positive for leg pain.  Neurological: Neurological negative. Hematologic: Hematologic/lymphatic negative.  Psychiatric: Psychiatric negative.       Objective:  Objective   Vitals:   05/02/21 1240  BP: 121/67  Pulse: 60  Resp: 20  Temp: 98 F (36.7 C)  SpO2: 97%  Weight: 201 lb 12.8 oz (91.5 kg)  Height: 5\' 10"  (1.778 m)   Body mass index is 28.96 kg/m.  Physical Exam HENT:     Head: Normocephalic.     Nose:     Comments: Wearing a mask Eyes:     Pupils: Pupils are equal, round, and reactive to light.  Cardiovascular:     Pulses:           Femoral pulses are 1+ on the right side and 0 on the left side.      Popliteal pulses are 0 on the right side and 0 on the left side.  Pulmonary:     Effort: Pulmonary effort is normal.  Abdominal:     General:  Abdomen is flat.     Palpations: Abdomen is soft.  Musculoskeletal:     Cervical back: Normal range of motion.     Left lower leg: Edema present.  Skin:    General: Skin is warm.     Capillary Refill: Capillary refill takes more than 3 seconds.  Neurological:     General: No focal deficit present.     Mental Status: He is alert.  Psychiatric:        Mood and Affect: Mood normal.        Behavior: Behavior normal.        Thought Content: Thought content normal.        Judgment: Judgment normal.    Data: ABI Findings:  +---------+------------------+-----+----------+--------+  Right    Rt Pressure (mmHg)IndexWaveform  Comment   +---------+------------------+-----+----------+--------+  Brachial 152                                        +---------+------------------+-----+----------+--------+  PTA      92                0.61 monophasic          +---------+------------------+-----+----------+--------+  DP       68                0.45 monophasic          +---------+------------------+-----+----------+--------+  Great Toe46                0.30                     +---------+------------------+-----+----------+--------+   +---------+------------------+-----+----------+-------+  Left     Lt Pressure (mmHg)IndexWaveform  Comment  +---------+------------------+-----+----------+-------+  Brachial 130                                       +---------+------------------+-----+----------+-------+  PTA      54                0.36 monophasic         +---------+------------------+-----+----------+-------+  DP       61                0.40 monophasic         +---------+------------------+-----+----------+-------+  Great Toe45                 0.30                    +---------+------------------+-----+----------+-------+   +-------+-----------+-----------+------------+------------+  ABI/TBIToday's ABIToday's TBIPrevious ABIPrevious TBI  +-------+-----------+-----------+------------+------------+  Right  0.61       0.3                                  +-------+-----------+-----------+------------+------------+  Left   0.4        0.3                                  +-------+-----------+-----------+------------+------------+          Assessment/Plan:     70 year old male with left lower extremity rest pain and 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  I discussed with him  the limb threatening nature of this disease process particularly in light of previous endovascular intervention bilaterally.  He does have a right common femoral pulse I cannot reliably feel 1 on the left.  I discussed the need for evaluation with angiography from right common femoral approach which will include possible intervention of the left lower extremity although with failed prior interventions he is at high risk for needing bypass and would possibly consider admission that day for cardiac work-up, vein mapping and scheduling of surgery.  Patient and his wife demonstrate good understanding we will get him scheduled in the very near future     Maeola Harman MD [facility]

## 2021-05-03 ENCOUNTER — Encounter (HOSPITAL_COMMUNITY)
Admission: RE | Disposition: A | Discharge status: Home / Self Care | Payer: Self-pay | Source: Home / Self Care | Attending: Vascular Surgery

## 2021-05-03 ENCOUNTER — Other Ambulatory Visit: Payer: Self-pay

## 2021-05-03 ENCOUNTER — Inpatient Hospital Stay (HOSPITAL_COMMUNITY)
Admission: RE | Admit: 2021-05-03 | Discharge: 2021-05-06 | DRG: 253 | Disposition: A | Discharge status: Home / Self Care | Payer: Medicare HMO | Attending: Vascular Surgery | Admitting: Vascular Surgery

## 2021-05-03 DIAGNOSIS — R262 Difficulty in walking, not elsewhere classified: Secondary | ICD-10-CM | POA: Diagnosis present

## 2021-05-03 DIAGNOSIS — I69351 Hemiplegia and hemiparesis following cerebral infarction affecting right dominant side: Secondary | ICD-10-CM

## 2021-05-03 DIAGNOSIS — I10 Essential (primary) hypertension: Secondary | ICD-10-CM | POA: Diagnosis present

## 2021-05-03 DIAGNOSIS — I70222 Atherosclerosis of native arteries of extremities with rest pain, left leg: Principal | ICD-10-CM | POA: Diagnosis present

## 2021-05-03 DIAGNOSIS — Z79899 Other long term (current) drug therapy: Secondary | ICD-10-CM

## 2021-05-03 DIAGNOSIS — Z7902 Long term (current) use of antithrombotics/antiplatelets: Secondary | ICD-10-CM

## 2021-05-03 DIAGNOSIS — F419 Anxiety disorder, unspecified: Secondary | ICD-10-CM | POA: Diagnosis present

## 2021-05-03 DIAGNOSIS — Z8249 Family history of ischemic heart disease and other diseases of the circulatory system: Secondary | ICD-10-CM | POA: Diagnosis not present

## 2021-05-03 DIAGNOSIS — E785 Hyperlipidemia, unspecified: Secondary | ICD-10-CM | POA: Diagnosis present

## 2021-05-03 DIAGNOSIS — Z20822 Contact with and (suspected) exposure to covid-19: Secondary | ICD-10-CM | POA: Diagnosis present

## 2021-05-03 DIAGNOSIS — I739 Peripheral vascular disease, unspecified: Secondary | ICD-10-CM | POA: Diagnosis present

## 2021-05-03 DIAGNOSIS — F32A Depression, unspecified: Secondary | ICD-10-CM | POA: Diagnosis present

## 2021-05-03 DIAGNOSIS — Z87891 Personal history of nicotine dependence: Secondary | ICD-10-CM | POA: Diagnosis not present

## 2021-05-03 DIAGNOSIS — I70223 Atherosclerosis of native arteries of extremities with rest pain, bilateral legs: Secondary | ICD-10-CM

## 2021-05-03 DIAGNOSIS — K219 Gastro-esophageal reflux disease without esophagitis: Secondary | ICD-10-CM | POA: Diagnosis present

## 2021-05-03 HISTORY — PX: ABDOMINAL AORTOGRAM W/LOWER EXTREMITY: CATH118223

## 2021-05-03 LAB — POCT I-STAT, CHEM 8
BUN: 7 mg/dL — ABNORMAL LOW (ref 8–23)
Calcium, Ion: 1.24 mmol/L (ref 1.15–1.40)
Chloride: 98 mmol/L (ref 98–111)
Creatinine, Ser: 0.9 mg/dL (ref 0.61–1.24)
Glucose, Bld: 112 mg/dL — ABNORMAL HIGH (ref 70–99)
HCT: 38 % — ABNORMAL LOW (ref 39.0–52.0)
Hemoglobin: 12.9 g/dL — ABNORMAL LOW (ref 13.0–17.0)
Potassium: 4.1 mmol/L (ref 3.5–5.1)
Sodium: 139 mmol/L (ref 135–145)
TCO2: 33 mmol/L — ABNORMAL HIGH (ref 22–32)

## 2021-05-03 LAB — SARS CORONAVIRUS 2 (TAT 6-24 HRS): SARS Coronavirus 2: NEGATIVE

## 2021-05-03 SURGERY — ABDOMINAL AORTOGRAM W/LOWER EXTREMITY
Anesthesia: LOCAL | Laterality: Bilateral

## 2021-05-03 MED ORDER — HEPARIN (PORCINE) IN NACL 1000-0.9 UT/500ML-% IV SOLN
INTRAVENOUS | Status: DC | PRN
  Administered 2021-05-03 (×2): 500 mL

## 2021-05-03 MED ORDER — FENTANYL CITRATE (PF) 100 MCG/2ML IJ SOLN
INTRAMUSCULAR | Status: AC
  Filled 2021-05-03: qty 2

## 2021-05-03 MED ORDER — MIDAZOLAM HCL 2 MG/2ML IJ SOLN
INTRAMUSCULAR | Status: DC | PRN
  Administered 2021-05-03: 1 mg via INTRAVENOUS

## 2021-05-03 MED ORDER — OXYCODONE HCL 5 MG PO TABS
ORAL_TABLET | ORAL | Status: AC
  Filled 2021-05-03: qty 2

## 2021-05-03 MED ORDER — ACETAMINOPHEN 325 MG PO TABS: 650.0000 mg | ORAL_TABLET | ORAL | Status: DC | PRN

## 2021-05-03 MED ORDER — SODIUM CHLORIDE 0.9 % IV SOLN: INTRAVENOUS | Status: DC

## 2021-05-03 MED ORDER — FENTANYL CITRATE (PF) 100 MCG/2ML IJ SOLN
INTRAMUSCULAR | Status: DC | PRN
  Administered 2021-05-03 (×2): 50 ug via INTRAVENOUS

## 2021-05-03 MED ORDER — MORPHINE SULFATE (PF) 2 MG/ML IV SOLN
2.0000 mg | INTRAVENOUS | Status: DC | PRN
  Administered 2021-05-03 – 2021-05-04 (×3): 2 mg via INTRAVENOUS
  Filled 2021-05-03 (×3): qty 1

## 2021-05-03 MED ORDER — ONDANSETRON HCL 4 MG/2ML IJ SOLN: 4.0000 mg | Freq: Four times a day (QID) | INTRAMUSCULAR | Status: DC | PRN

## 2021-05-03 MED ORDER — IODIXANOL 320 MG/ML IV SOLN
INTRAVENOUS | Status: DC | PRN
  Administered 2021-05-03: 97 mL via INTRA_ARTERIAL

## 2021-05-03 MED ORDER — SODIUM CHLORIDE 0.9% FLUSH: 3.0000 mL | INTRAVENOUS | Status: DC | PRN

## 2021-05-03 MED ORDER — SODIUM CHLORIDE 0.9% FLUSH
3.0000 mL | Freq: Two times a day (BID) | INTRAVENOUS | Status: DC
  Administered 2021-05-03: 3 mL via INTRAVENOUS

## 2021-05-03 MED ORDER — MIDAZOLAM HCL 2 MG/2ML IJ SOLN
INTRAMUSCULAR | Status: AC
  Filled 2021-05-03: qty 2

## 2021-05-03 MED ORDER — HYDRALAZINE HCL 20 MG/ML IJ SOLN: 5.0000 mg | INTRAMUSCULAR | Status: DC | PRN

## 2021-05-03 MED ORDER — SODIUM CHLORIDE 0.9 % IV SOLN: 250.0000 mL | INTRAVENOUS | Status: DC | PRN

## 2021-05-03 MED ORDER — LIDOCAINE HCL (PF) 1 % IJ SOLN
INTRAMUSCULAR | Status: DC | PRN
  Administered 2021-05-03: 15 mL via SUBCUTANEOUS

## 2021-05-03 MED ORDER — HEPARIN (PORCINE) IN NACL 1000-0.9 UT/500ML-% IV SOLN
INTRAVENOUS | Status: AC
  Filled 2021-05-03: qty 1000

## 2021-05-03 MED ORDER — OXYCODONE HCL 5 MG PO TABS
5.0000 mg | ORAL_TABLET | ORAL | Status: DC | PRN
  Administered 2021-05-03 – 2021-05-04 (×3): 10 mg via ORAL
  Filled 2021-05-03 (×2): qty 2

## 2021-05-03 MED ORDER — LABETALOL HCL 5 MG/ML IV SOLN: 10.0000 mg | INTRAVENOUS | Status: DC | PRN

## 2021-05-03 MED ORDER — LIDOCAINE HCL (PF) 1 % IJ SOLN
INTRAMUSCULAR | Status: AC
  Filled 2021-05-03: qty 30

## 2021-05-03 SURGICAL SUPPLY — 11 items
CATH OMNI FLUSH 5F 65CM (CATHETERS) ×2 IMPLANT
CATH SOFT-VU 4F 65 STRAIGHT (CATHETERS) ×1 IMPLANT
CATH SOFT-VU STRAIGHT 4F 65CM (CATHETERS) ×2
DEVICE CLOSURE MYNXGRIP 5F (Vascular Products) ×2 IMPLANT
KIT MICROPUNCTURE NIT STIFF (SHEATH) ×2 IMPLANT
KIT PV (KITS) ×2 IMPLANT
SHEATH PINNACLE 5F 10CM (SHEATH) ×2 IMPLANT
SYR MEDRAD MARK V 150ML (SYRINGE) ×2 IMPLANT
TRANSDUCER W/STOPCOCK (MISCELLANEOUS) ×2 IMPLANT
TRAY PV CATH (CUSTOM PROCEDURE TRAY) ×2 IMPLANT
WIRE STARTER BENTSON 035X150 (WIRE) ×2 IMPLANT

## 2021-05-03 NOTE — Op Note (Signed)
Patient name: Justin Wells MRN: [id number] DOB: 02/26/1951 Sex: male  05/03/2021 Pre-operative Diagnosis: Critical limb ischemia of the left lower with rest pain Post-operative diagnosis:  Same Surgeon:  Cephus Shelling, MD Assistant: Tarry Kos, MD Procedure Performed: 1.  Ultrasound-guided access right common femoral artery 2.  Aortogram including catheter selection of aorta 3.  Bilateral lower extremity arteriogram 4.  30 minutes of monitored moderate conscious sedation time 5.  Mynx closure of the right common femoral artery  Indications: Patient is a 70 year old male who has a history of bilateral lower extremity endovascular interventions.  He was seen yesterday in the office with severe rest pain in the left leg by Dr. Randie Heinz.  He is scheduled today for lower extremity arteriogram.  No femoral pulse was appreciated on the left so high risk for needing open surgical intervention.  Findings:   Aortogram showed patent renal arteries bilaterally.  The aorta had no flow limiting stenosis.  There was some calcification at the ostium of the left common iliac artery that looked to be approximately 40 to 50% stenosis.  We did a pullback pressure across the left common iliac artery and there was only a 5 mm Hg gradient which did not appear significant.  Left lower extremity runoff shows a heavily calcified left common femoral artery with stenosis greater than 80% with patent profunda.  The SFA is patent although there does appear to be a high-grade calcified stenosis proximally just before the proximal SFA stent.  The remainder of the SFA including stented segments are patent.  He has multiple focal high-grade >80% stenosis in the above-knee popliteal artery that appear calcified.  Right lower extremity runoff shows a calcified and diseased right common femoral artery with a patent profunda.  SFA has a flush occlusion.  Does reconstitute distal SFA.  Patent popliteal artery with  two-vessel runoff in the peroneal and posterior tibial.  The TP trunk has a calcified moderate stenosis in the proximal vessel.    Procedure:  The patient was identified in the holding area and taken to room 8.  The patient was then placed supine on the table and prepped and draped in the usual sterile fashion.  A time out was called.  Ultrasound was used to evaluate the right common femoral artery.  It was patent .  A digital ultrasound image was acquired.  A micropuncture needle was used to access the right common femoral artery under ultrasound guidance.  An 018 wire was advanced without resistance and a micropuncture sheath was placed.  The 018 wire was removed and a benson wire was placed.  The micropuncture sheath was exchanged for a 5 french sheath.  An omniflush catheter was advanced over the wire to the level of L-1.  An abdominal angiogram was obtained.  The catheter was pulled down and bilateral lower extremity runoff was obtained.  After looking at the images, it appeared that he would need open surgical intervention given high-grade stenosis in the left common femoral artery with near subtotal occlusion.  We did want to evaluate the left common iliac lesion given apparent disease here.  We crossed the aortic bifurcation into the left external iliac with a benson wire.  Straight flush catheter was placed over the aortic bifurcation.  We then did a pullback pressure through the left iliac artery into the aorta.  There was only a 5 mm Hg pressure difference here.  We elected not to treat the left common iliac disease.  Wires  and catheters were removed.  A mynx closure device was deployed in the right common femoral artery.  Plan: Patient does not feel he can tolerate his rest pain through the weekend.  He will be scheduled tomorrow for a left common femoral endarterectomy with possible antegrade left leg stenting with Dr. Randie Heinz.  Will admit.  Cephus Shelling, MD [facility]  [place] Office: [phone]

## 2021-05-03 NOTE — H&P (Signed)
History and Physical Interval Note:  05/03/2021 12:55 PM  Justin Wells  has presented today for surgery, with the diagnosis of pad.  The various methods of treatment have been discussed with the patient and family. After consideration of risks, benefits and other options for treatment, the patient has consented to  Procedure(s): ABDOMINAL AORTOGRAM W/LOWER EXTREMITY (N/A) as a surgical intervention.  The patient's history has been reviewed, patient examined, no change in status, stable for surgery.  I have reviewed the patient's chart and labs.  Questions were answered to the patient's satisfaction.     Justin Wells  Patient ID: Justin Wells, male   DOB: Mar 13, 1951, 70 y.o.   MRN: [id number]   Reason for Consult: New Patient (Initial Visit)   Referred by John Giovanni, MD   Subjective:    Subjective [] Expand by Default   HPI:   Justin Wells is a 70 y.o. male history of bilateral lower extremity endovascular intervention.  He now states over the past couple weeks he has severe pain in his left lower extremity with dependent edema and rubor.  He is unable to walk to the next room due to pain.  He has had cramping of his foot during sleep requiring him to hang his foot over the side of the bed.  He is a former smoker.  He is taking a statin and Plavix daily.       Past Medical History:  Diagnosis Date   Anxiety     Arthritis      "hands; back; legs" (04/18/2016)   Barrett's esophagus     Cerebrovascular disease     CHF (congestive heart failure) (HCC)      "said I had this a few years ago; it went away" (04/18/2016)   Chronic back pain     Coronary artery disease     Depression     Gastroesophageal reflux     H/O hiatal hernia     Headache(784.0)      "probably monthly" (01/27/2013)   Hyperlipidemia     Hypertension     Peripheral arterial disease (HCC)     Seizures (HCC)      "daily the last 5 days" (04/18/2016)   Stroke [facility]) 2009  2010    "left me w/partial  paralysis on right face, weak right leg and hand; I've had 2 or 3 strokes; can't remember the dates" (01/27/2013)         Family History  Problem Relation Age of Onset   Heart disease Mother          Heart Disease before age 29   Hypertension Mother     Heart attack Mother     Heart attack Father           Past Surgical History:  Procedure Laterality Date   ABDOMINAL AORTAGRAM N/A 01/26/2013    Procedure: ABDOMINAL 01/28/2013;  Surgeon: Ronny Flurry, MD;  Location: [facility] CATH LAB;  Service: Cardiovascular;  Laterality: N/A;   CARDIAC CATHETERIZATION       CHOLECYSTECTOMY N/A 05/08/2016    Procedure: LAPAROSCOPIC CHOLECYSTECTOMY WITH INTRAOPERATIVE CHOLANGIOGRAM;  Surgeon: 07/08/2016, MD;  Location: [facility] OR;  Service: General;  Laterality: N/A;   COLONOSCOPY       CORONARY ANGIOPLASTY WITH STENT PLACEMENT   11/02/2007    "2" (01/27/2013)   DIAGNOSTIC LAPAROSCOPY        "twice after nissen; it kept coming apart " (01/27/2013)   ESOPHAGOGASTRODUODENOSCOPY  FEMORAL ARTERY STENT Bilateral 01/26/2013   HERNIA REPAIR       LAPAROSCOPIC GASTRIC BANDING   2010   LAPAROSCOPIC NISSEN FUNDOPLICATION   ?2001   LEFT HEART CATHETERIZATION WITH CORONARY ANGIOGRAM N/A 03/09/2014    Procedure: LEFT HEART CATHETERIZATION WITH CORONARY ANGIOGRAM;  Surgeon: Iran OuchMuhammad A Arida, MD;  Location: [facility] CATH LAB;  Service: Cardiovascular;  Laterality: N/A;   LOWER EXTREMITY ANGIOGRAM Left 03/02/2013    Procedure: LOWER EXTREMITY ANGIOGRAM;  Surgeon: Nada LibmanVance W Brabham, MD;  [facility];  Service: Cardiovascular;  Laterality: Left;      Short Social History:  Social History         Tobacco Use   Smoking status: Former      Packs/day: 2.00      Years: 35.00      Pack years: 70.00      Types: Cigarettes      Quit date: 01/29/2000      Years since quitting: 21.2   Smokeless tobacco: Never  Substance Use Topics   Alcohol use: No      Comment: 04/18/2016  "quit drinking in 07/1999; I'm a recovering  alcoholic"      No Known Allergies         Current Outpatient Medications  Medication Sig Dispense Refill   atorvastatin (LIPITOR) 80 MG tablet Take 80 mg by mouth daily.       clopidogrel (PLAVIX) 75 MG tablet Take 1 tablet by mouth once daily 90 tablet 3   DULoxetine (CYMBALTA) 60 MG capsule Take 60 mg by mouth daily.       fluticasone (FLONASE) 50 MCG/ACT nasal spray SMARTSIG:2 Spray(s) Both Nares Every Morning       Homeopathic Products (LEG CRAMP RELIEF PO) Take 2-4 tablets by mouth as needed (for leg cramps).       HYDROmorphone (DILAUDID) 4 MG tablet Take 4 mg by mouth every 6 (six) hours as needed (for breakthrough pain).        isosorbide mononitrate (IMDUR) 60 MG 24 hr tablet Take 60 mg by mouth every morning.       levETIRAcetam (KEPPRA) 750 MG tablet Take 1 tablet (750 mg total) by mouth 2 (two) times daily. Take 1.5 tablets twice daily 60 tablet 1   lisinopril (PRINIVIL,ZESTRIL) 5 MG tablet Take 1 tablet (5 mg total) by mouth daily. 30 tablet 1   LORazepam (ATIVAN) 0.5 MG tablet Take 0.5 mg by mouth 2 (two) times daily.       metoprolol succinate (TOPROL-XL) 25 MG 24 hr tablet Take 25 mg by mouth daily.       mirtazapine (REMERON) 15 MG tablet Take 15 mg by mouth at bedtime.       morphine (MS CONTIN) 15 MG 12 hr tablet Take 15 mg by mouth 2 (two) times daily.       MYRBETRIQ 25 MG TB24 tablet Take 25 mg by mouth daily.       nitroGLYCERIN (NITROSTAT) 0.4 MG SL tablet Place 0.4 mg under the tongue every 5 (five) minutes as needed for chest pain.       PARoxetine (PAXIL) 20 MG tablet Take 1 tablet (20 mg total) by mouth every morning. 30 tablet 1   polyethylene glycol (MIRALAX / GLYCOLAX) packet Take 17 g by mouth daily as needed for mild constipation. 14 each 0   tamsulosin (FLOMAX) 0.4 MG CAPS capsule Take 0.4 mg by mouth daily.       esomeprazole (NEXIUM) 40 MG capsule  Take 40 mg by mouth at bedtime.         No current facility-administered medications for this visit.       Review of Systems  Constitutional:  Constitutional negative. HENT: HENT negative.  Eyes: Eyes negative.  Respiratory: Respiratory negative.  Cardiovascular: Cardiovascular negative.  GI: Gastrointestinal negative.  Musculoskeletal: Positive for leg pain.  Neurological: Neurological negative. Hematologic: Hematologic/lymphatic negative.  Psychiatric: Psychiatric negative.         Objective:    Objective [] Expand by Default        Vitals:    05/02/21 1240  BP: 121/67  Pulse: 60  Resp: 20  Temp: 98 F (36.7 C)  SpO2: 97%  Weight: 201 lb 12.8 oz (91.5 kg)  Height: 5\' 10"  (1.778 m)    Body mass index is 28.96 kg/m.   Physical Exam HENT:     Head: Normocephalic.     Nose:     Comments: Wearing a mask Eyes:     Pupils: Pupils are equal, round, and reactive to light.  Cardiovascular:     Pulses:          Femoral pulses are 1+ on the right side and 0 on the left side.      Popliteal pulses are 0 on the right side and 0 on the left side.  Pulmonary:     Effort: Pulmonary effort is normal.  Abdominal:     General: Abdomen is flat.     Palpations: Abdomen is soft.  Musculoskeletal:     Cervical back: Normal range of motion.     Left lower leg: Edema present.  Skin:    General: Skin is warm.     Capillary Refill: Capillary refill takes more than 3 seconds.  Neurological:     General: No focal deficit present.     Mental Status: He is alert.  Psychiatric:        Mood and Affect: Mood normal.        Behavior: Behavior normal.        Thought Content: Thought content normal.        Judgment: Judgment normal.      Data: ABI Findings:  +---------+------------------+-----+----------+--------+  Right    Rt Pressure (mmHg)IndexWaveform  Comment   +---------+------------------+-----+----------+--------+  Brachial 152                                        +---------+------------------+-----+----------+--------+  PTA      92                0.61  monophasic          +---------+------------------+-----+----------+--------+  DP       68                0.45 monophasic          +---------+------------------+-----+----------+--------+  Great Toe46                0.30                     +---------+------------------+-----+----------+--------+   +---------+------------------+-----+----------+-------+  Left     Lt Pressure (mmHg)IndexWaveform  Comment  +---------+------------------+-----+----------+-------+  Brachial 130                                       +---------+------------------+-----+----------+-------+  PTA      54                0.36 monophasic         +---------+------------------+-----+----------+-------+  DP       61                0.40 monophasic         +---------+------------------+-----+----------+-------+  Great Toe45                0.30                    +---------+------------------+-----+----------+-------+   +-------+-----------+-----------+------------+------------+  ABI/TBIToday's ABIToday's TBIPrevious ABIPrevious TBI  +-------+-----------+-----------+------------+------------+  Right  0.61       0.3                                  +-------+-----------+-----------+------------+------------+  Left   0.4        0.3                                  +-------+-----------+-----------+------------+------------+           Assessment/Plan:    Assessment     70 year old male with left lower extremity rest pain and 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  I discussed with him the limb threatening nature of this disease process particularly in light of previous endovascular intervention bilaterally.  He does have a right common femoral pulse I cannot reliably feel 1 on the left.  I discussed the need for evaluation with angiography from right common femoral approach which will include possible intervention of the left lower extremity although  with failed prior interventions he is at high risk for needing bypass and would possibly consider admission that day for cardiac work-up, vein mapping and scheduling of surgery.  Patient and his wife demonstrate good understanding we will get him scheduled in the very near future         Maeola Harman MD [facility]

## 2021-05-04 ENCOUNTER — Inpatient Hospital Stay (HOSPITAL_COMMUNITY): Payer: Medicare HMO | Admitting: Anesthesiology

## 2021-05-04 ENCOUNTER — Encounter (HOSPITAL_COMMUNITY)
Admission: RE | Disposition: A | Discharge status: Home / Self Care | Payer: Self-pay | Source: Home / Self Care | Attending: Vascular Surgery

## 2021-05-04 ENCOUNTER — Inpatient Hospital Stay (HOSPITAL_COMMUNITY): Payer: Medicare HMO

## 2021-05-04 ENCOUNTER — Encounter (HOSPITAL_COMMUNITY): Payer: Self-pay | Admitting: Vascular Surgery

## 2021-05-04 DIAGNOSIS — I70222 Atherosclerosis of native arteries of extremities with rest pain, left leg: Secondary | ICD-10-CM

## 2021-05-04 HISTORY — PX: LOWER EXTREMITY ANGIOGRAM: SHX5508

## 2021-05-04 HISTORY — PX: INSERTION OF ILIAC STENT: SHX6256

## 2021-05-04 HISTORY — PX: PATCH ANGIOPLASTY: SHX6230

## 2021-05-04 HISTORY — PX: ENDARTERECTOMY FEMORAL: SHX5804

## 2021-05-04 LAB — BASIC METABOLIC PANEL
Anion gap: 7 (ref 5–15)
BUN: 9 mg/dL (ref 8–23)
CO2: 26 mmol/L (ref 22–32)
Calcium: 8.4 mg/dL — ABNORMAL LOW (ref 8.9–10.3)
Chloride: 102 mmol/L (ref 98–111)
Creatinine, Ser: 0.91 mg/dL (ref 0.61–1.24)
GFR, Estimated: >60 mL/min (ref >60)
Glucose, Bld: 104 mg/dL — ABNORMAL HIGH (ref 70–99)
Potassium: 4.1 mmol/L (ref 3.5–5.1)
Sodium: 135 mmol/L (ref 135–145)

## 2021-05-04 LAB — TYPE AND SCREEN
ABO/RH(D): O NEG
Antibody Screen: NEGATIVE

## 2021-05-04 LAB — CBC
HCT: 34.3 % — ABNORMAL LOW (ref 39.0–52.0)
Hemoglobin: 11.3 g/dL — ABNORMAL LOW (ref 13.0–17.0)
MCH: 26.9 pg (ref 26.0–34.0)
MCHC: 32.9 g/dL (ref 30.0–36.0)
MCV: 81.7 fL (ref 80.0–100.0)
Platelets: 143 10*3/uL — ABNORMAL LOW (ref 150–400)
RBC: 4.2 MIL/uL — ABNORMAL LOW (ref 4.22–5.81)
RDW: 15 % (ref 11.5–15.5)
WBC: 6.5 10*3/uL (ref 4.0–10.5)
nRBC: 0 % (ref 0.0–0.2)

## 2021-05-04 LAB — ABO/RH: ABO/RH(D): O NEG

## 2021-05-04 SURGERY — ENDARTERECTOMY, FEMORAL
Anesthesia: General | Laterality: Left

## 2021-05-04 MED ORDER — PROTAMINE SULFATE 10 MG/ML IV SOLN
INTRAVENOUS | Status: DC | PRN
  Administered 2021-05-04: 5 mg via INTRAVENOUS
  Administered 2021-05-04: 45 mg via INTRAVENOUS

## 2021-05-04 MED ORDER — FENTANYL CITRATE (PF) 250 MCG/5ML IJ SOLN
INTRAMUSCULAR | Status: AC
  Filled 2021-05-04: qty 5

## 2021-05-04 MED ORDER — HEMOSTATIC AGENTS (NO CHARGE) OPTIME
TOPICAL | Status: DC | PRN
Start: 2021-05-04 — End: 2021-05-04
  Administered 2021-05-04 (×2): 1 via TOPICAL

## 2021-05-04 MED ORDER — PHENOL 1.4 % MT LIQD: 1.0000 | OROMUCOSAL | Status: DC | PRN

## 2021-05-04 MED ORDER — MORPHINE SULFATE ER 15 MG PO TBCR
15.0000 mg | EXTENDED_RELEASE_TABLET | Freq: Two times a day (BID) | ORAL | Status: DC
  Administered 2021-05-04 – 2021-05-06 (×4): 15 mg via ORAL
  Filled 2021-05-04 (×4): qty 1

## 2021-05-04 MED ORDER — SUGAMMADEX SODIUM 200 MG/2ML IV SOLN
INTRAVENOUS | Status: DC | PRN
  Administered 2021-05-04: 200 mg via INTRAVENOUS

## 2021-05-04 MED ORDER — DIPHENHYDRAMINE HCL 50 MG/ML IJ SOLN
12.5000 mg | Freq: Four times a day (QID) | INTRAMUSCULAR | Status: DC | PRN
Start: 2021-05-04 — End: 2021-05-05

## 2021-05-04 MED ORDER — HYDROMORPHONE HCL 1 MG/ML IJ SOLN
0.2500 mg | INTRAMUSCULAR | Status: DC | PRN
  Administered 2021-05-04 (×4): 0.5 mg via INTRAVENOUS

## 2021-05-04 MED ORDER — MAGNESIUM SULFATE 2 GM/50ML IV SOLN
2.0000 g | Freq: Every day | INTRAVENOUS | Status: DC | PRN
Start: 2021-05-04 — End: 2021-05-06

## 2021-05-04 MED ORDER — HYDRALAZINE HCL 20 MG/ML IJ SOLN: 5.0000 mg | INTRAMUSCULAR | Status: DC | PRN

## 2021-05-04 MED ORDER — ATORVASTATIN CALCIUM 80 MG PO TABS
80.0000 mg | ORAL_TABLET | Freq: Every day | ORAL | Status: DC
  Administered 2021-05-04 – 2021-05-05 (×2): 80 mg via ORAL
  Filled 2021-05-04 (×2): qty 1

## 2021-05-04 MED ORDER — IODIXANOL 320 MG/ML IV SOLN
INTRAVENOUS | Status: DC | PRN
  Administered 2021-05-04: 20 mL

## 2021-05-04 MED ORDER — SUCCINYLCHOLINE CHLORIDE 200 MG/10ML IV SOSY
PREFILLED_SYRINGE | INTRAVENOUS | Status: DC | PRN
  Administered 2021-05-04: 120 mg via INTRAVENOUS

## 2021-05-04 MED ORDER — FLUTICASONE PROPIONATE 50 MCG/ACT NA SUSP
2.0000 | Freq: Every day | NASAL | Status: DC
  Administered 2021-05-05 – 2021-05-06 (×2): 2 via NASAL
  Filled 2021-05-04: qty 16

## 2021-05-04 MED ORDER — POLYVINYL ALCOHOL 1.4 % OP SOLN
1.0000 [drp] | OPHTHALMIC | Status: DC | PRN
  Filled 2021-05-04: qty 15

## 2021-05-04 MED ORDER — GUAIFENESIN-DM 100-10 MG/5ML PO SYRP: 15.0000 mL | ORAL_SOLUTION | ORAL | Status: DC | PRN

## 2021-05-04 MED ORDER — CHLORHEXIDINE GLUCONATE 0.12 % MT SOLN
OROMUCOSAL | Status: AC
  Administered 2021-05-04: 15 mL via OROMUCOSAL
  Filled 2021-05-04: qty 15

## 2021-05-04 MED ORDER — HYDROMORPHONE 1 MG/ML IV SOLN
INTRAVENOUS | Status: DC
  Administered 2021-05-04: 5.9 mg via INTRAVENOUS
  Administered 2021-05-04: 2.1 mg via INTRAVENOUS
  Administered 2021-05-05: 3 mg via INTRAVENOUS
  Administered 2021-05-05: 1.8 mg via INTRAVENOUS
  Administered 2021-05-05: 2.1 mg via INTRAVENOUS
  Filled 2021-05-04 (×2): qty 30

## 2021-05-04 MED ORDER — SODIUM CHLORIDE 0.9 % IV SOLN: INTRAVENOUS | Status: DC

## 2021-05-04 MED ORDER — ACETAMINOPHEN 500 MG PO TABS: 1000.0000 mg | ORAL_TABLET | Freq: Four times a day (QID) | ORAL | Status: DC | PRN

## 2021-05-04 MED ORDER — HYDROMORPHONE HCL 1 MG/ML IJ SOLN
INTRAMUSCULAR | Status: AC
  Filled 2021-05-04: qty 1

## 2021-05-04 MED ORDER — NALOXONE HCL 0.4 MG/ML IJ SOLN: 0.4000 mg | INTRAMUSCULAR | Status: DC | PRN

## 2021-05-04 MED ORDER — ALBUMIN HUMAN 5 % IV SOLN: INTRAVENOUS | Status: DC | PRN

## 2021-05-04 MED ORDER — MIRTAZAPINE 15 MG PO TABS
15.0000 mg | ORAL_TABLET | Freq: Every day | ORAL | Status: DC
  Administered 2021-05-04 – 2021-05-05 (×2): 15 mg via ORAL
  Filled 2021-05-04 (×2): qty 1

## 2021-05-04 MED ORDER — LABETALOL HCL 5 MG/ML IV SOLN
10.0000 mg | INTRAVENOUS | Status: DC | PRN
Start: 2021-05-04 — End: 2021-05-06

## 2021-05-04 MED ORDER — POVIDONE-IODINE 7.5 % EX SOLN
CUTANEOUS | Status: DC | PRN
  Administered 2021-05-04: 1 via TOPICAL

## 2021-05-04 MED ORDER — ONDANSETRON HCL 4 MG/2ML IJ SOLN
INTRAMUSCULAR | Status: DC | PRN
  Administered 2021-05-04: 4 mg via INTRAVENOUS

## 2021-05-04 MED ORDER — HEPARIN 6000 UNIT IRRIGATION SOLUTION
Status: AC
  Filled 2021-05-04: qty 500

## 2021-05-04 MED ORDER — POVIDONE-IODINE 10 % EX SOLN
CUTANEOUS | Status: DC | PRN
  Administered 2021-05-04: 1 via TOPICAL

## 2021-05-04 MED ORDER — ONDANSETRON HCL 4 MG/2ML IJ SOLN: 4.0000 mg | Freq: Four times a day (QID) | INTRAMUSCULAR | Status: DC | PRN

## 2021-05-04 MED ORDER — ROCURONIUM BROMIDE 10 MG/ML (PF) SYRINGE
PREFILLED_SYRINGE | INTRAVENOUS | Status: DC | PRN
  Administered 2021-05-04: 20 mg via INTRAVENOUS
  Administered 2021-05-04: 50 mg via INTRAVENOUS
  Administered 2021-05-04: 30 mg via INTRAVENOUS

## 2021-05-04 MED ORDER — LEVETIRACETAM 750 MG PO TABS
750.0000 mg | ORAL_TABLET | Freq: Two times a day (BID) | ORAL | Status: DC
  Administered 2021-05-04 – 2021-05-06 (×4): 750 mg via ORAL
  Filled 2021-05-04 (×6): qty 1

## 2021-05-04 MED ORDER — CEFAZOLIN SODIUM-DEXTROSE 2-4 GM/100ML-% IV SOLN
2.0000 g | Freq: Three times a day (TID) | INTRAVENOUS | Status: AC
  Administered 2021-05-04 – 2021-05-05 (×2): 2 g via INTRAVENOUS
  Filled 2021-05-04 (×2): qty 100

## 2021-05-04 MED ORDER — DEXAMETHASONE SODIUM PHOSPHATE 10 MG/ML IJ SOLN
INTRAMUSCULAR | Status: DC | PRN
Start: 2021-05-04 — End: 2021-05-04
  Administered 2021-05-04: 10 mg via INTRAVENOUS

## 2021-05-04 MED ORDER — HEPARIN 6000 UNIT IRRIGATION SOLUTION
Status: DC | PRN
  Administered 2021-05-04: 1

## 2021-05-04 MED ORDER — PHENYLEPHRINE HCL-NACL 20-0.9 MG/250ML-% IV SOLN
INTRAVENOUS | Status: DC | PRN
  Administered 2021-05-04: 30 ug/min via INTRAVENOUS

## 2021-05-04 MED ORDER — 0.9 % SODIUM CHLORIDE (POUR BTL) OPTIME
TOPICAL | Status: DC | PRN
  Administered 2021-05-04: 2000 mL

## 2021-05-04 MED ORDER — NITROGLYCERIN 0.4 MG SL SUBL: 0.4000 mg | SUBLINGUAL_TABLET | SUBLINGUAL | Status: DC | PRN

## 2021-05-04 MED ORDER — SODIUM CHLORIDE 0.9 % IV SOLN: 500.0000 mL | Freq: Once | INTRAVENOUS | Status: DC | PRN

## 2021-05-04 MED ORDER — OXYCODONE HCL 5 MG PO TABS: 5.0000 mg | ORAL_TABLET | Freq: Once | ORAL | Status: DC | PRN

## 2021-05-04 MED ORDER — FENTANYL CITRATE (PF) 250 MCG/5ML IJ SOLN
INTRAMUSCULAR | Status: DC | PRN
  Administered 2021-05-04 (×5): 50 ug via INTRAVENOUS

## 2021-05-04 MED ORDER — HEPARIN SODIUM (PORCINE) 5000 UNIT/ML IJ SOLN
5000.0000 [IU] | Freq: Three times a day (TID) | INTRAMUSCULAR | Status: DC
  Administered 2021-05-05 – 2021-05-06 (×4): 5000 [IU] via SUBCUTANEOUS
  Filled 2021-05-04 (×4): qty 1

## 2021-05-04 MED ORDER — ALUM & MAG HYDROXIDE-SIMETH 200-200-20 MG/5ML PO SUSP: 15.0000 mL | ORAL | Status: DC | PRN

## 2021-05-04 MED ORDER — METOPROLOL TARTRATE 5 MG/5ML IV SOLN: 2.0000 mg | INTRAVENOUS | Status: DC | PRN

## 2021-05-04 MED ORDER — CLOPIDOGREL BISULFATE 75 MG PO TABS
75.0000 mg | ORAL_TABLET | Freq: Every day | ORAL | Status: DC
  Administered 2021-05-04 – 2021-05-06 (×3): 75 mg via ORAL
  Filled 2021-05-04 (×2): qty 1

## 2021-05-04 MED ORDER — CHLORHEXIDINE GLUCONATE 0.12 % MT SOLN: 15.0000 mL | Freq: Once | OROMUCOSAL | Status: AC

## 2021-05-04 MED ORDER — ASPIRIN EC 81 MG PO TBEC
81.0000 mg | DELAYED_RELEASE_TABLET | Freq: Every day | ORAL | Status: DC
  Administered 2021-05-05 – 2021-05-06 (×2): 81 mg via ORAL
  Filled 2021-05-04 (×2): qty 1

## 2021-05-04 MED ORDER — POTASSIUM CHLORIDE CRYS ER 20 MEQ PO TBCR: 20.0000 meq | EXTENDED_RELEASE_TABLET | Freq: Every day | ORAL | Status: DC | PRN

## 2021-05-04 MED ORDER — LACTATED RINGERS IV SOLN: INTRAVENOUS | Status: DC

## 2021-05-04 MED ORDER — ORAL CARE MOUTH RINSE: 15.0000 mL | Freq: Once | OROMUCOSAL | Status: AC

## 2021-05-04 MED ORDER — HEPARIN SODIUM (PORCINE) 1000 UNIT/ML IJ SOLN
INTRAMUSCULAR | Status: DC | PRN
  Administered 2021-05-04: 5000 [IU] via INTRAVENOUS
  Administered 2021-05-04: 10000 [IU] via INTRAVENOUS

## 2021-05-04 MED ORDER — SODIUM CHLORIDE 0.9% FLUSH
9.0000 mL | INTRAVENOUS | Status: DC | PRN
  Administered 2021-05-04: 3 mL via INTRAVENOUS

## 2021-05-04 MED ORDER — PROPOFOL 10 MG/ML IV BOLUS
INTRAVENOUS | Status: DC | PRN
  Administered 2021-05-04: 140 mg via INTRAVENOUS

## 2021-05-04 MED ORDER — ISOSORBIDE MONONITRATE ER 60 MG PO TB24
60.0000 mg | ORAL_TABLET | Freq: Every morning | ORAL | Status: DC
  Administered 2021-05-05 – 2021-05-06 (×2): 60 mg via ORAL
  Filled 2021-05-04 (×2): qty 1

## 2021-05-04 MED ORDER — ONDANSETRON HCL 4 MG/2ML IJ SOLN: 4.0000 mg | Freq: Once | INTRAMUSCULAR | Status: DC | PRN

## 2021-05-04 MED ORDER — OXYCODONE HCL 5 MG/5ML PO SOLN
5.0000 mg | Freq: Once | ORAL | Status: DC | PRN
Start: 2021-05-04 — End: 2021-05-04

## 2021-05-04 MED ORDER — CEFAZOLIN SODIUM-DEXTROSE 2-4 GM/100ML-% IV SOLN
2.0000 g | Freq: Once | INTRAVENOUS | Status: AC
  Administered 2021-05-04: 2 g via INTRAVENOUS

## 2021-05-04 MED ORDER — DOCUSATE SODIUM 100 MG PO CAPS
100.0000 mg | ORAL_CAPSULE | Freq: Every day | ORAL | Status: DC
  Administered 2021-05-05 – 2021-05-06 (×2): 100 mg via ORAL
  Filled 2021-05-04 (×2): qty 1

## 2021-05-04 MED ORDER — NAPHAZOLINE-PHENIRAMINE 0.025-0.3 % OP SOLN: 1.0000 [drp] | Freq: Four times a day (QID) | OPHTHALMIC | Status: DC | PRN

## 2021-05-04 MED ORDER — LIDOCAINE 2% (20 MG/ML) 5 ML SYRINGE
INTRAMUSCULAR | Status: DC | PRN
  Administered 2021-05-04: 100 mg via INTRAVENOUS

## 2021-05-04 MED ORDER — ACETAMINOPHEN 10 MG/ML IV SOLN: 1000.0000 mg | Freq: Once | INTRAVENOUS | Status: DC | PRN

## 2021-05-04 MED ORDER — METOPROLOL SUCCINATE ER 25 MG PO TB24
25.0000 mg | ORAL_TABLET | Freq: Every day | ORAL | Status: DC
  Administered 2021-05-05 – 2021-05-06 (×2): 25 mg via ORAL
  Filled 2021-05-04 (×2): qty 1

## 2021-05-04 MED ORDER — PANTOPRAZOLE SODIUM 40 MG PO TBEC
40.0000 mg | DELAYED_RELEASE_TABLET | Freq: Every day | ORAL | Status: DC
  Administered 2021-05-05 – 2021-05-06 (×2): 40 mg via ORAL
  Filled 2021-05-04 (×2): qty 1

## 2021-05-04 MED ORDER — DIPHENHYDRAMINE HCL 12.5 MG/5ML PO ELIX
12.5000 mg | ORAL_SOLUTION | Freq: Four times a day (QID) | ORAL | Status: DC | PRN
  Filled 2021-05-04: qty 5

## 2021-05-04 SURGICAL SUPPLY — 61 items
ADH SKN CLS APL DERMABOND .7 (GAUZE/BANDAGES/DRESSINGS) ×2
BAG COUNTER SPONGE SURGICOUNT (BAG) ×3 IMPLANT
BAG SPNG CNTER NS LX DISP (BAG) ×2
BALLN MUSTANG 5X80X135 (BALLOONS) ×3
BALLOON MUSTANG 5X80X135 (BALLOONS) ×2 IMPLANT
CANISTER SUCT 3000ML PPV (MISCELLANEOUS) ×3 IMPLANT
CATH BEACON 5 .035 65 KMP TIP (CATHETERS) ×3 IMPLANT
CATH QUICKCROSS SUPP .035X90CM (MICROCATHETER) ×3 IMPLANT
CLIP LIGATING EXTRA MED SLVR (CLIP) ×3 IMPLANT
CLIP LIGATING EXTRA SM BLUE (MISCELLANEOUS) ×3 IMPLANT
CLIP VESOCCLUDE SM WIDE 24/CT (CLIP) ×3 IMPLANT
COVER DOME SNAP 22 D (MISCELLANEOUS) ×3 IMPLANT
COVER PROBE W GEL 5X96 (DRAPES) ×3 IMPLANT
DERMABOND ADVANCED (GAUZE/BANDAGES/DRESSINGS) ×1
DERMABOND ADVANCED .7 DNX12 (GAUZE/BANDAGES/DRESSINGS) ×2 IMPLANT
DEVICE INFLATION ENCORE 26 (MISCELLANEOUS) ×3 IMPLANT
DRAIN CHANNEL 15F RND FF W/TCR (WOUND CARE) IMPLANT
DRAPE HALF SHEET 40X57 (DRAPES) ×3 IMPLANT
ELECT REM PT RETURN 9FT ADLT (ELECTROSURGICAL) ×3
ELECTRODE REM PT RTRN 9FT ADLT (ELECTROSURGICAL) ×2 IMPLANT
EVACUATOR SILICONE 100CC (DRAIN) IMPLANT
GLIDEWIRE ADV .035X260CM (WIRE) ×3 IMPLANT
GLOVE SRG 8 PF TXTR STRL LF DI (GLOVE) ×2 IMPLANT
GLOVE SURG ENC MOIS LTX SZ7.5 (GLOVE) ×3 IMPLANT
GLOVE SURG PR MICRO ENCORE 7 (GLOVE) ×3 IMPLANT
GLOVE SURG PR MICRO ENCORE 7.5 (GLOVE) ×3 IMPLANT
GLOVE SURG UNDER POLY LF SZ8 (GLOVE) ×3
GOWN STRL REUS W/ TWL LRG LVL3 (GOWN DISPOSABLE) ×6 IMPLANT
GOWN STRL REUS W/ TWL XL LVL3 (GOWN DISPOSABLE) ×2 IMPLANT
GOWN STRL REUS W/TWL LRG LVL3 (GOWN DISPOSABLE) ×9
GOWN STRL REUS W/TWL XL LVL3 (GOWN DISPOSABLE) ×3
HEMOSTAT SNOW SURGICEL 2X4 (HEMOSTASIS) ×3 IMPLANT
KIT BASIN OR (CUSTOM PROCEDURE TRAY) ×3 IMPLANT
KIT TURNOVER KIT B (KITS) ×3 IMPLANT
LOOP VESSEL MINI RED (MISCELLANEOUS) ×3 IMPLANT
NS IRRIG 1000ML POUR BTL (IV SOLUTION) ×6 IMPLANT
PACK PERIPHERAL VASCULAR (CUSTOM PROCEDURE TRAY) ×3 IMPLANT
PAD ARMBOARD 7.5X6 YLW CONV (MISCELLANEOUS) ×6 IMPLANT
PATCH VASC XENOSURE 1CMX6CM (Vascular Products) ×3 IMPLANT
PATCH VASC XENOSURE 1X6 (Vascular Products) ×2 IMPLANT
POWDER SURGICEL 3.0 GRAM (HEMOSTASIS) ×3 IMPLANT
SET MICROPUNCTURE 5F STIFF (MISCELLANEOUS) ×3 IMPLANT
SHEATH PINNACLE ST 6F 45CM (SHEATH) ×3 IMPLANT
SPONGE T-LAP 18X18 ~~LOC~~+RFID (SPONGE) ×3 IMPLANT
STENT ELUVIA 6X80X130 (Permanent Stent) ×3 IMPLANT
SUT ETHILON 3 0 PS 1 (SUTURE) IMPLANT
SUT MNCRL AB 4-0 PS2 18 (SUTURE) ×3 IMPLANT
SUT PROLENE 5 0 C 1 24 (SUTURE) ×18 IMPLANT
SUT PROLENE 6 0 BV (SUTURE) ×21 IMPLANT
SUT SILK  1 MH (SUTURE) ×1
SUT SILK 1 MH (SUTURE) ×2 IMPLANT
SUT VIC AB 2-0 CT1 27 (SUTURE) ×3
SUT VIC AB 2-0 CT1 TAPERPNT 27 (SUTURE) ×2 IMPLANT
SUT VIC AB 3-0 SH 27 (SUTURE) ×3
SUT VIC AB 3-0 SH 27X BRD (SUTURE) ×2 IMPLANT
TOWEL GREEN STERILE (TOWEL DISPOSABLE) ×6 IMPLANT
TOWEL GREEN STERILE FF (TOWEL DISPOSABLE) ×3 IMPLANT
TUBE CONNECTING 20X1/4 (TUBING) ×3 IMPLANT
UNDERPAD 30X36 HEAVY ABSORB (UNDERPADS AND DIAPERS) ×3 IMPLANT
WATER STERILE IRR 1000ML POUR (IV SOLUTION) ×3 IMPLANT
WIRE BENTSON .035X145CM (WIRE) ×3 IMPLANT

## 2021-05-04 NOTE — Anesthesia Postprocedure Evaluation (Signed)
Anesthesia Post Note  Patient: Justin Wells  Procedure(s) Performed: COMMON ENDARTERECTOMY FEMORAL WITH LEFT LEG STENT (Left) LOWER EXTREMITY ANGIOGRAM (Left) INSERTION OF LEFT SUPERFICIAL FEMORAL ARTERY 6 X 80 ELUVIA  STENT (Left) PATCH ANGIOPLASTY OF LEFT FEMORAL ARTERY USING 1X6 XENOSURE BOVINE PATCH     Patient location during evaluation: PACU Anesthesia Type: General Level of consciousness: awake and alert Pain management: pain level controlled Vital Signs Assessment: post-procedure vital signs reviewed and stable Respiratory status: spontaneous breathing, nonlabored ventilation, respiratory function stable and patient connected to nasal cannula oxygen Cardiovascular status: blood pressure returned to baseline and stable Postop Assessment: no apparent nausea or vomiting Anesthetic complications: no   No notable events documented.  Last Vitals:  Vitals:   05/04/21 1408 05/04/21 1530  BP: 137/79   Pulse: 70   Resp: 16 18  Temp: 36.8 C   SpO2: 94% 97%    Last Pain:  Vitals:   05/04/21 1530  TempSrc:   PainSc: 10-Worst pain ever                 Odyssey Vasbinder,Undray S

## 2021-05-04 NOTE — Progress Notes (Signed)
  Progress Note    05/04/2021 9:55 AM Day of Surgery  Subjective: Complains of pain left foot  Vitals:   05/04/21 0759 05/04/21 0945  BP: 138/67 (!) 161/59  Pulse: 63 66  Resp: 13 15  Temp: 98.5 F (36.9 C) 98 F (36.7 C)  SpO2: 99% 98%    Physical Exam: Awake alert oriented Pain and rubor left foot  CBC    Component Value Date/Time   WBC 6.5 05/04/2021 0148   RBC 4.20 (L) 05/04/2021 0148   HGB 11.3 (L) 05/04/2021 0148   HCT 34.3 (L) 05/04/2021 0148   PLT 143 (L) 05/04/2021 0148   MCV 81.7 05/04/2021 0148   MCH 26.9 05/04/2021 0148   MCHC 32.9 05/04/2021 0148   RDW 15.0 05/04/2021 0148   LYMPHSABS 1.8 05/08/2016 0416   MONOABS 0.8 05/08/2016 0416   EOSABS 0.4 05/08/2016 0416   BASOSABS 0.1 05/08/2016 0416    BMET    Component Value Date/Time   NA 135 05/04/2021 0148   K 4.1 05/04/2021 0148   CL 102 05/04/2021 0148   CO2 26 05/04/2021 0148   GLUCOSE 104 (H) 05/04/2021 0148   BUN 9 05/04/2021 0148   CREATININE 0.91 05/04/2021 0148   CALCIUM 8.4 (L) 05/04/2021 0148   GFRNONAA >60 05/04/2021 0148   GFRAA >60 07/18/2016 1238    INR    Component Value Date/Time   INR 1.10 05/08/2016 0416     Intake/Output Summary (Last 24 hours) at 05/04/2021 0955 Last data filed at 05/04/2021 0900 Gross per 24 hour  Intake --  Output 400 ml  Net -400 ml     Assessment:  70 y.o. male is here with rest pain left lower extremity.  He has evidence of high-grade stenosis left common femoral artery and distal SFA proximal popliteal artery.  Plan: OR today for left common femoral endarterectomy with possible antegrade stenting of SFA popliteal segment   Atalya Dano C. Randie Heinz, MD [facility] Office: [phone] Pager: [phone]  05/04/2021 9:55 AM

## 2021-05-04 NOTE — Op Note (Signed)
Patient name: Justin Wells MRN: [id number] DOB: 07-31-1951 Sex: male  05/04/2021 Pre-operative Diagnosis: critical left lower extremity ischemia with rest pain Post-operative diagnosis:  Same Surgeon:  Luanna Salk. Randie Heinz, MD Assistant: Gillis Santa, MD Procedure Performed: 1.  Left common femoral endarterectomy with bovine pericardial patch angioplasty 2.  Stent of left SFA popliteal segment with 6 x 80 mm Elluvia   Indications: 70 year old male with history of bilateral lower extremity SFA stenting.  He now has rest pain that is severe in nature of his left foot with severely depressed ABI it is indicated for the above-noted procedure.  We have discussed the risk benefits and alternatives he agrees to proceed.  An assistant was necessary to facilitate exposure and expedite the case.  Findings: Left common femoral artery was heavily calcified.  We performed endarterectomy extending up into the external leg artery and had very strong inflow.  We endarterectomized the profunda and the SFA proximally where there was a stent.  There was an area of the common femoral artery which had thinned out we did have to repair this with a 6-0 Prolene suture.  We placed the patch and had very strong inflow.  The distal SFA and proximal popliteal artery had what appeared to be 90% stenosis.  Runoff is dominant via the peroneal and posterior tibial arteries the anterior tibial artery is also patent proximally.  After stenting there is 0% residual stenosis.  By angiography there was good flow into the stents of his SFA and the profunda femoris artery.  At completion there very strong intertibial posterior tibial signals at the ankle.   Procedure:  The patient was identified in the holding area and taken to the operating room where he is placed supine on upper table and general anesthesia was induced.  He was gently prepped draped in left lower extremity usual fashion, antibiotics were minister timeout was called.  We  began with transverse incision in the groin through dissected out the common femoral artery probe dissected up onto the inguinal ligament identified multiple side branches placed Vesseloops around these as well as the proximal external iliac artery.  There was 1 area of the external neck artery that did need repair stitch placed which was 5-0 Prolene suture ultimately with the pledget.  We dissected out the profunda placed Vesseloops around this as well as the medial circumflex branch and the SFA where the the stent was easily palpable.  Patient was fully heparinized.  We clamped her outflow followed by her inflow and open the vessel longitudinally.  This was heavily calcified.  We performed extensive endarterectomy.  We ended this right at the SFA stent we did have good backbleeding.  Profunda we performed endarterectomy there was an area just near this that was very thin down the common femoral artery and this was repaired with 6-0 Prolene suture in a running fashion we ultimately placed a pledget at the distal aspect of this as well.  We performed endarterectomy the external leg artery and establish strong inflow.  A bovine pericardial patch was then fashioned and sewn in place with 5-0 Prolene suture.  Prior completion the usual flushing maneuvers were performed.  Upon completion we did have to place the pledgeted suture in the external neck artery and a pledgeted suture in the profunda as well to obtain hemostasis.  We then cannulated by tunneling the micropuncture needle through the skin and subcutaneous tissue down to the patch placed a micropuncture wire into the SFA under fluoroscopic  guidance followed by a micropuncture sheath and a Glidewire advantage.  We then placed a long 6 French sheath we sutured this to the skin as it up and over configuration.  We crossed the stenosed area in the distal SFA popliteal segment used quick cross catheter confirm intraluminal access.  We then primarily stented with  drug-eluting stent that was 6 x 80 mm.  This was postdilated with 5 mm balloon.  Completion angiography demonstrated no residual stenosis.  We pulled the sheath back perform angiography which demonstrated that the proximal stent was patent as was the profunda femoris artery.  Satisfied with this we then placed a repair suture where we cannulated remove the sheath since our suture.  There was good signal in the SFA and profunda distally we administered 50 mg of protamine.  We then obtained hemostasis diligently irrigated the wound and closed in layers with Vicryl and Monocryl at the level of the skin.  Dermabond is placed above this.  He was then awakened from anesthesia having tolerated procedure without any complication.  All counts were correct at completion.  There were very strong posterior tibial and anterior tibial signals at completion.   EBL: 600cc  Makhai Fulco C. Randie Heinz, MD [facility] Office: [phone] Pager: [phone]

## 2021-05-04 NOTE — Progress Notes (Signed)
    S/P left femoral endarterectomy with SFA stent placement  Foot warm with intact motor and sensation Doppler PT brisk Left groin incision soft without hematoma  Stable post op  Mosetta Pigeon PA-C

## 2021-05-04 NOTE — Anesthesia Preprocedure Evaluation (Signed)
Anesthesia Evaluation  Patient identified by MRN, date of birth, ID band Patient awake    Reviewed: Allergy & Precautions, NPO status , Patient's Chart, lab work & pertinent test results  Airway Mallampati: II  TM Distance: >3 FB Neck ROM: Full    Dental no notable dental hx.    Pulmonary neg pulmonary ROS, former smoker,    Pulmonary exam normal breath sounds clear to auscultation       Cardiovascular hypertension, Pt. on medications and Pt. on home beta blockers + Peripheral Vascular Disease  Normal cardiovascular exam Rhythm:Regular Rate:Normal     Neuro/Psych Seizures -, Well Controlled,  CVA, Residual Symptoms negative psych ROS   GI/Hepatic Neg liver ROS, hiatal hernia, GERD  Medicated,  Endo/Other  negative endocrine ROS  Renal/GU negative Renal ROS  negative genitourinary   Musculoskeletal negative musculoskeletal ROS (+)   Abdominal   Peds negative pediatric ROS (+)  Hematology negative hematology ROS (+)   Anesthesia Other Findings   Reproductive/Obstetrics negative OB ROS                             Anesthesia Physical Anesthesia Plan  ASA: 3  Anesthesia Plan: General   Post-op Pain Management:    Induction: Intravenous  PONV Risk Score and Plan: 2 and Ondansetron, Dexamethasone and Treatment may vary due to age or medical condition  Airway Management Planned: Oral ETT  Additional Equipment:   Intra-op Plan:   Post-operative Plan: Extubation in OR  Informed Consent: I have reviewed the patients History and Physical, chart, labs and discussed the procedure including the risks, benefits and alternatives for the proposed anesthesia with the patient or authorized representative who has indicated his/her understanding and acceptance.     Dental advisory given  Plan Discussed with: CRNA and Surgeon  Anesthesia Plan Comments:         Anesthesia Quick  Evaluation

## 2021-05-04 NOTE — Anesthesia Procedure Notes (Signed)
Procedure Name: Intubation Date/Time: 05/04/2021 10:15 AM Performed by: Griffin Dakin, CRNA Pre-anesthesia Checklist: Patient identified, Emergency Drugs available, Suction available and Patient being monitored Patient Re-evaluated:Patient Re-evaluated prior to induction Oxygen Delivery Method: Circle system utilized Preoxygenation: Pre-oxygenation with 100% oxygen Induction Type: IV induction Ventilation: Mask ventilation without difficulty and Oral airway inserted - appropriate to patient size Laryngoscope Size: Mac and 4 Grade View: Grade II Tube type: Oral Tube size: 7.5 mm Number of attempts: 1 Airway Equipment and Method: Stylet and Oral airway Placement Confirmation: ETT inserted through vocal cords under direct vision, positive ETCO2 and breath sounds checked- equal and bilateral Secured at: 23 cm Tube secured with: Tape Dental Injury: Teeth and Oropharynx as per pre-operative assessment

## 2021-05-04 NOTE — Transfer of Care (Signed)
Immediate Anesthesia Transfer of Care Note  Patient: Justin Wells  Procedure(s) Performed: COMMON ENDARTERECTOMY FEMORAL WITH LEFT LEG STENT (Left) LOWER EXTREMITY ANGIOGRAM (Left) INSERTION OF LEFT SUPERFICIAL FEMORAL ARTERY 6 X 80 ELUVIA  STENT (Left) PATCH ANGIOPLASTY OF LEFT FEMORAL ARTERY USING 1X6 XENOSURE BOVINE PATCH  Patient Location: PACU  Anesthesia Type:General  Level of Consciousness: awake, alert  and oriented  Airway & Oxygen Therapy: Patient Spontanous Breathing  Post-op Assessment: Report given to RN and Post -op Vital signs reviewed and stable  Post vital signs: Reviewed and stable  Last Vitals:  Vitals Value Taken Time  BP 157/79 05/04/21 1323  Temp    Pulse 78 05/04/21 1326  Resp 11 05/04/21 1326  SpO2 98 % 05/04/21 1326  Vitals shown include unvalidated device data.  Last Pain:  Vitals:   05/04/21 0945  TempSrc: Oral  PainSc: 7       Patients Stated Pain Goal: 1 (05/04/21 0205)  Complications: No notable events documented.

## 2021-05-05 LAB — CBC
HCT: 30.6 % — ABNORMAL LOW (ref 39.0–52.0)
Hemoglobin: 10.1 g/dL — ABNORMAL LOW (ref 13.0–17.0)
MCH: 27.4 pg (ref 26.0–34.0)
MCHC: 33 g/dL (ref 30.0–36.0)
MCV: 83.2 fL (ref 80.0–100.0)
Platelets: 166 10*3/uL (ref 150–400)
RBC: 3.68 MIL/uL — ABNORMAL LOW (ref 4.22–5.81)
RDW: 14.9 % (ref 11.5–15.5)
WBC: 14.6 10*3/uL — ABNORMAL HIGH (ref 4.0–10.5)
nRBC: 0 % (ref 0.0–0.2)

## 2021-05-05 LAB — BASIC METABOLIC PANEL
Anion gap: 9 (ref 5–15)
BUN: 10 mg/dL (ref 8–23)
CO2: 22 mmol/L (ref 22–32)
Calcium: 8.2 mg/dL — ABNORMAL LOW (ref 8.9–10.3)
Chloride: 103 mmol/L (ref 98–111)
Creatinine, Ser: 0.93 mg/dL (ref 0.61–1.24)
GFR, Estimated: >60 mL/min (ref >60)
Glucose, Bld: 138 mg/dL — ABNORMAL HIGH (ref 70–99)
Potassium: 4.4 mmol/L (ref 3.5–5.1)
Sodium: 134 mmol/L — ABNORMAL LOW (ref 135–145)

## 2021-05-05 LAB — HEPATIC FUNCTION PANEL
ALT: 11 U/L (ref 0–44)
AST: 20 U/L (ref 15–41)
Albumin: 3.2 g/dL — ABNORMAL LOW (ref 3.5–5.0)
Alkaline Phosphatase: 57 U/L (ref 38–126)
Bilirubin, Direct: 0.1 mg/dL (ref 0.0–0.2)
Indirect Bilirubin: 0.4 mg/dL (ref 0.3–0.9)
Total Bilirubin: 0.5 mg/dL (ref 0.3–1.2)
Total Protein: 5.7 g/dL — ABNORMAL LOW (ref 6.5–8.1)

## 2021-05-05 LAB — LIPID PANEL
Cholesterol: 100 mg/dL (ref 0–200)
HDL: 33 mg/dL — ABNORMAL LOW (ref >40)
LDL Cholesterol: 53 mg/dL (ref 0–99)
Total CHOL/HDL Ratio: 3 RATIO
Triglycerides: 69 mg/dL (ref <150)
VLDL: 14 mg/dL (ref 0–40)

## 2021-05-05 MED ORDER — HYDROMORPHONE HCL 2 MG PO TABS
4.0000 mg | ORAL_TABLET | ORAL | Status: DC | PRN
  Administered 2021-05-05 – 2021-05-06 (×6): 4 mg via ORAL
  Filled 2021-05-05 (×6): qty 2

## 2021-05-05 MED ORDER — HYDROMORPHONE HCL 1 MG/ML IJ SOLN
1.0000 mg | INTRAMUSCULAR | Status: DC | PRN
  Filled 2021-05-05: qty 1

## 2021-05-05 NOTE — Progress Notes (Signed)
Wasted 14 mg Dilaudid PCA with Musician

## 2021-05-05 NOTE — Evaluation (Addendum)
Occupational Therapy Evaluation and Discharge Patient Details Name: Justin Wells MRN: [id number] DOB: [birth year] Today's Date: 05/05/2021    History of Present Illness The pt is a 70 yo male presenting for L common femoral endarterectomy and stent of L SFA on 9/2 due LLE ischemia. 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   Clinical Impression   This 70 yo male admitted and underwent above presents to acute OT with PLOF of being totally independent with basic ADLs and mobility. Currently he is min A for LB ADLs and min A for sit<>stand from low surfaces and independent with mobility (just increased time). No further OT needs, has wife to A prn, we will sign off.    Follow Up Recommendations  No OT follow up;Supervision - Intermittent    Equipment Recommendations  None recommended by OT       Precautions / Restrictions Precautions Precautions: None Restrictions Weight Bearing Restrictions: No      Mobility Bed Mobility Overal bed mobility: Modified Independent             General bed mobility comments: HOB up and increased time    Transfers Overall transfer level: Needs assistance   Transfers: Sit to/from Stand Sit to Stand: Min assist         General transfer comment: from low surfaces due to pain, higher surfaces Mod I    Balance Overall balance assessment: Independent                                         ADL either performed or assessed with clinical judgement   ADL                                         General ADL Comments: Mod I but needs A for socks and shoes     Vision Baseline Vision/History: 1 Wears glasses Ability to See in Adequate Light: 0 Adequate              Pertinent Vitals/Pain Pain Assessment: 0-10 Pain Score: 8  Pain Location: leg Pain Descriptors / Indicators: Sore Pain Intervention(s): Limited activity within patient's  tolerance;Monitored during session;Repositioned     Hand Dominance Right   Extremity/Trunk Assessment Upper Extremity Assessment Upper Extremity Assessment: Overall WFL for tasks assessed           Communication Communication Communication: No difficulties   Cognition Arousal/Alertness: Awake/alert Behavior During Therapy: WFL for tasks assessed/performed Overall Cognitive Status: Within Functional Limits for tasks assessed                                                Home Living Family/patient expects to be discharged to:: Private residence Living Arrangements: Spouse/significant other Available Help at Discharge: Family;Available 24 hours/day Type of Home: Apartment Home Access: Ramped entrance;Stairs to enter Entrance Stairs-Number of Steps: 2 Entrance Stairs-Rails: Right Home Layout: One level     Bathroom Shower/Tub: Tub/shower unit;Curtain   Bathroom Toilet: Standard Bathroom Accessibility: Yes   Home Equipment: Cane - single point;Crutches;Shower seat;Grab bars - tub/shower;Walker - 2 wheels  Prior Functioning/Environment Level of Independence: Independent        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        [employer] Problem List: Decreased range of motion;Pain         OT Goals(Current goals can be found in the care plan section) Acute Rehab OT Goals Patient Stated Goal: home tomorrow   AM-PAC OT "6 Clicks" Daily Activity     Outcome Measure Help from another person eating meals?: None Help from another person taking care of personal grooming?: None Help from another person toileting, which includes using toliet, bedpan, or urinal?: None Help from another person bathing (including washing, rinsing, drying)?: A Little Help from another person to put on and taking off regular upper body clothing?: None Help from another person to put on and taking off regular lower body clothing?: A Little 6  Click Score: 22   End of Session Equipment Utilized During Treatment: Gait belt Nurse Communication: Mobility status  Activity Tolerance: Patient tolerated treatment well Patient left: in chair;with call bell/phone within reach  OT Visit Diagnosis: Pain Pain - Right/Left: Left Pain - part of body: Leg                Time: 3354-5625 OT Time Calculation (min): 24 min Charges:  OT General Charges $OT Visit: 1 Visit OT Evaluation $OT Eval Moderate Complexity: 1 Mod  Justin Wells, OTR/L Acute [employer] Pager [phone] Office [phone]    Justin Wells 05/05/2021, 11:06 AM

## 2021-05-05 NOTE — Progress Notes (Addendum)
[facility] [place]  Subjective  - Pain is better.  He states his left leg and foot feel better.   Objective 122/61 67 98.2 F (36.8 C) (Oral) 17 97%  Intake/Output Summary (Last 24 hours) at 05/05/2021 0846 Last data filed at 05/05/2021 8786 Gross per 24 hour  Intake 2569.73 ml  Output 2200 ml  Net 369.73 ml    Left LE femoral endarterectomy incision healing well without hematoma Doppler signals brisk DP, PT/peroneal intact Motor and sensation intact Lungs non labored breathing  Assessment/Planning: POD # 1S/P left femoral endarterectomy with SFA stent placement  He was placed on a full dose PCA over night.  He has chronic back pain issues and is on MS contin 15 mg q12 and Po dilaudid 4 mg q 4.  I will d/c the PCA and restart his home pain medications. Mobility encouraged. Plan for discharge tomorrow pending mobility and pain controlled  EBL 600 ml during surgey, HGB stable post op anemia 10.1 Leukocytosis, encourage IS, incision healing well without sign of infection .  Mosetta Pigeon 05/05/2021 8:46 AM --  Laboratory Lab Results: Recent Labs    05/04/21 0148 05/05/21 0100  WBC 6.5 14.6*  HGB 11.3* 10.1*  HCT 34.3* 30.6*  PLT 143* 166   BMET Recent Labs    05/04/21 0148 05/05/21 0100  NA 135 134*  K 4.1 4.4  CL 102 103  CO2 26 22  GLUCOSE 104* 138*  BUN 9 10  CREATININE 0.91 0.93  CALCIUM 8.4* 8.2*    COAG Lab Results  Component Value Date   INR 1.10 05/08/2016   INR 1.10 04/12/2015   INR 1.03 03/07/2014   No results found for: PTT  I have interviewed the patient and examined the patient. I agree with the findings by the PA. Walking the halls with PTx.  Cari Caraway, MD

## 2021-05-05 NOTE — Evaluation (Signed)
Physical Therapy Evaluation and Discharge Patient Details Name: Justin Wells MRN: [id number] DOB: Mar 09, 1951 Today's Date: 05/05/2021   History of Present Illness  The pt is a 70 yo male presenting for L common femoral endarterectomy and stent of L SFA on 9/2 due LLE ischemia. 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   Clinical Impression  Pt in bed upon arrival of PT, agreeable to evaluation at this time. Prior to admission the pt was completely independent with all mobility, living with his wife and two young grandchildren. The pt now presents after surgery with not only reduction in pain but also improvement in activity tolerance from mobility prior to procedure. The pt was able to complete transfers without assistance or AD, and 300 ft hallway ambulation with good stability, speed, and no need for rest breaks (which the pt states he would have needed prior to surgery). The pt demos good safety awareness, no further acute PT needs or follow up therapies. Thank you for the consult.      Follow Up Recommendations No PT follow up;Supervision for mobility/OOB    Equipment Recommendations  None recommended by PT    Recommendations for Other Services       Precautions / Restrictions Precautions Precautions: None Restrictions Weight Bearing Restrictions: No      Mobility  Bed Mobility Overal bed mobility: Modified Independent             General bed mobility comments: HOB up and increased time    Transfers Overall transfer level: Needs assistance Equipment used: None Transfers: Sit to/from Stand Sit to Stand: Min assist         General transfer comment: minA to lower to low surfaces, modI from elevated surfaces/normal heights  Ambulation/Gait Ambulation/Gait assistance: Supervision Gait Distance (Feet): 300 Feet Assistive device: None Gait Pattern/deviations: WFL(Within Functional Limits) Gait velocity:  WFL Gait velocity interpretation: 1.31 - 2.62 ft/sec, indicative of limited community ambulator General Gait Details: generally WFL, pt reports improved mobility from PTA      Balance Overall balance assessment: Independent                                           Pertinent Vitals/Pain Pain Assessment: 0-10 Pain Score: 8  Pain Location: leg Pain Descriptors / Indicators: Sore Pain Intervention(s): Limited activity within patient's tolerance;Monitored during session;Repositioned    Home Living Family/patient expects to be discharged to:: Private residence Living Arrangements: Spouse/significant other Available Help at Discharge: Family;Available 24 hours/day Type of Home: Apartment Home Access: Ramped entrance;Stairs to enter Entrance Stairs-Rails: Right Entrance Stairs-Number of Steps: 2 Home Layout: One level Home Equipment: Cane - single point;Crutches;Shower seat;Grab bars - tub/shower;Walker - 2 wheels      Prior Function Level of Independence: Independent         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     [employer]   Dominant Hand: Right    Extremity/Trunk Assessment   Upper Extremity Assessment Upper Extremity Assessment: Defer to OT evaluation    Lower Extremity Assessment Lower Extremity Assessment: Overall WFL for tasks assessed    Cervical / Trunk Assessment Cervical / Trunk Assessment: Normal  Communication   Communication: No difficulties  Cognition Arousal/Alertness: Awake/alert Behavior During Therapy: WFL for tasks assessed/performed Overall Cognitive Status: Within Functional Limits for tasks assessed  General Comments General comments (skin integrity, edema, etc.): VSS on RA        Assessment/Plan    PT Assessment Patent does not need any further PT services         PT Goals (Current goals can be found in the Care Plan  section)  Acute Rehab PT Goals Patient Stated Goal: home tomorrow PT Goal Formulation: With patient Time For Goal Achievement: 05/19/21 Potential to Achieve Goals: Good            Co-evaluation PT/OT/SLP Co-Evaluation/Treatment: Yes Reason for Co-Treatment: For patient/therapist safety;To address functional/ADL transfers PT goals addressed during session: Mobility/safety with mobility;Balance;Strengthening/ROM OT goals addressed during session: ADL's and self-care;Strengthening/ROM       AM-PAC PT "6 Clicks" Mobility  Outcome Measure Help needed turning from your back to your side while in a flat bed without using bedrails?: None Help needed moving from lying on your back to sitting on the side of a flat bed without using bedrails?: None Help needed moving to and from a bed to a chair (including a wheelchair)?: A Little Help needed standing up from a chair using your arms (e.g., wheelchair or bedside chair)?: A Little Help needed to walk in hospital room?: A Little Help needed climbing 3-5 steps with a railing? : A Little 6 Click Score: 20    End of Session Equipment Utilized During Treatment: Gait belt Activity Tolerance: Patient tolerated treatment well Patient left: in chair;with call bell/phone within reach;with chair alarm set Nurse Communication: Mobility status PT Visit Diagnosis: Other abnormalities of gait and mobility (R26.89);Pain Pain - Right/Left: Left Pain - part of body: Leg    Time: 2706-2376 PT Time Calculation (min) (ACUTE ONLY): 20 min   Charges:   PT Evaluation $PT Eval Low Complexity: 1 Low          Vickki Muff, PT, DPT   Acute Rehabilitation Department Pager #: [phone]  Ronnie Derby 05/05/2021, 11:09 AM

## 2021-05-06 LAB — CBC
HCT: 24.9 % — ABNORMAL LOW (ref 39.0–52.0)
Hemoglobin: 8.3 g/dL — ABNORMAL LOW (ref 13.0–17.0)
MCH: 27.4 pg (ref 26.0–34.0)
MCHC: 33.3 g/dL (ref 30.0–36.0)
MCV: 82.2 fL (ref 80.0–100.0)
Platelets: 148 10*3/uL — ABNORMAL LOW (ref 150–400)
RBC: 3.03 MIL/uL — ABNORMAL LOW (ref 4.22–5.81)
RDW: 15.4 % (ref 11.5–15.5)
WBC: 8.5 10*3/uL (ref 4.0–10.5)
nRBC: 0 % (ref 0.0–0.2)

## 2021-05-06 LAB — BASIC METABOLIC PANEL
Anion gap: 9 (ref 5–15)
BUN: 10 mg/dL (ref 8–23)
CO2: 25 mmol/L (ref 22–32)
Calcium: 8.1 mg/dL — ABNORMAL LOW (ref 8.9–10.3)
Chloride: 102 mmol/L (ref 98–111)
Creatinine, Ser: 0.86 mg/dL (ref 0.61–1.24)
GFR, Estimated: >60 mL/min (ref >60)
Glucose, Bld: 120 mg/dL — ABNORMAL HIGH (ref 70–99)
Potassium: 3.5 mmol/L (ref 3.5–5.1)
Sodium: 136 mmol/L (ref 135–145)

## 2021-05-06 MED ORDER — PANTOPRAZOLE SODIUM 40 MG PO TBEC: 40.0000 mg | DELAYED_RELEASE_TABLET | Freq: Two times a day (BID) | ORAL | 3 refills | Status: DC

## 2021-05-06 NOTE — Progress Notes (Signed)
Notified patients wife (patient in car with wife) personal cane was left in room. Wife stated she will come back and get the cane this week.  Kenard Gower, RN

## 2021-05-06 NOTE — Progress Notes (Addendum)
Patient given discharge instructions. PIV's removed. Telemetry monitor removed, CCMD notified. Personal belongings packed. Patient taken to vehicle by staff.   Kenard Gower, RN

## 2021-05-06 NOTE — Progress Notes (Signed)
[facility] [place]  Subjective  - Doing better with pain control.   Objective 138/63 74 99.1 F (37.3 C) (Oral) 17 97%  Intake/Output Summary (Last 24 hours) at 05/06/2021 0723 Last data filed at 05/05/2021 2016 Gross per 24 hour  Intake 360 ml  Output 900 ml  Net -540 ml    Left LE femoral endarterectomy incision healing well without hematoma Doppler signals brisk DP, PT/peroneal intact Motor and sensation intact Lungs non labored breathing  Assessment/Planning: POD # 2 S/P left femoral endarterectomy with SFA stent placement  He is now on his home medication and pain is well controlled MS contin 15 mg q12 and Po dilaudid 4 mg q 4. Improved mobility. Plan for discharge WBC WNL, no fever or chills Blood loss anemia with HGB 8.3 asymptomatic F/u will be arranged for 2-3 weeks for incisional check  Mosetta Pigeon 05/06/2021 7:23 AM --  Laboratory Lab Results: Recent Labs    05/05/21 0100 05/06/21 0116  WBC 14.6* 8.5  HGB 10.1* 8.3*  HCT 30.6* 24.9*  PLT 166 148*   BMET Recent Labs    05/05/21 0100 05/06/21 0116  NA 134* 136  K 4.4 3.5  CL 103 102  CO2 22 25  GLUCOSE 138* 120*  BUN 10 10  CREATININE 0.93 0.86  CALCIUM 8.2* 8.1*    COAG Lab Results  Component Value Date   INR 1.10 05/08/2016   INR 1.10 04/12/2015   INR 1.03 03/07/2014   No results found for: PTT

## 2021-05-06 NOTE — Discharge Instructions (Signed)
° °  [facility]  ° °Discharge Instructions ° °Lower Extremity Angiogram; Angioplasty/Stenting ° °Please refer to the following instructions for your post-procedure care. Your surgeon or physician assistant will discuss any changes with you. ° °Activity ° °Avoid lifting more than 8 pounds (1 gallons of milk) for 72 hours (3 days) after your procedure. You may walk as much as you can tolerate. It's OK to drive after 72 hours. ° °Bathing/Showering ° °You may shower the day after your procedure. If you have a bandage, you may remove it at 24- 48 hours. Clean your incision site with mild soap and water. Pat the area dry with a clean towel. ° °Diet ° °Resume your pre-procedure diet. There are no special food restrictions following this procedure. All patients with peripheral vascular disease should follow a low fat/low cholesterol diet. In order to heal from your surgery, it is CRITICAL to get adequate nutrition. Your body requires vitamins, minerals, and protein. Vegetables are the best source of vitamins and minerals. Vegetables also provide the perfect balance of protein. Processed food has little nutritional value, so try to avoid this. ° °Medications ° °Resume taking all of your medications unless your doctor tells you not to. If your incision is causing pain, you may take over-the-counter pain relievers such as acetaminophen (Tylenol) ° °Follow Up ° °Follow up will be arranged at the time of your procedure. You may have an office visit scheduled or may be scheduled for surgery. Ask your surgeon if you have any questions. ° °Please call us immediately for any of the following conditions: °•Severe or worsening pain your legs or feet at rest or with walking. °•Increased pain, redness, drainage at your groin puncture site. °•Fever of 101 degrees or higher. °•If you have any mild or slow bleeding from your puncture site: lie down, apply firm constant pressure over the area with a piece of  gauze or a clean wash cloth for 30 minutes- no peeking!, call 911 right away if you are still bleeding after 30 minutes, or if the bleeding is heavy and unmanageable. ° °Reduce your risk factors of vascular disease: ° °Stop smoking. If you would like help call [facility] at 1-800-QUIT-NOW (1-800-784-8669) or [place] at 336-586-4000. °Manage your cholesterol °Maintain a desired weight °Control your diabetes °Keep your blood pressure down ° °If you have any questions, please call the office at [phone] ° °

## 2021-05-07 LAB — POCT I-STAT EG7
Acid-Base Excess: 1 mmol/L (ref 0.0–2.0)
Bicarbonate: 27.5 mmol/L (ref 20.0–28.0)
Calcium, Ion: 1.2 mmol/L (ref 1.15–1.40)
HCT: 34 % — ABNORMAL LOW (ref 39.0–52.0)
Hemoglobin: 11.6 g/dL — ABNORMAL LOW (ref 13.0–17.0)
O2 Saturation: 97 %
Potassium: 4.5 mmol/L (ref 3.5–5.1)
Sodium: 138 mmol/L (ref 135–145)
TCO2: 29 mmol/L (ref 22–32)
pCO2, Ven: 51 mmHg (ref 44.0–60.0)
pH, Ven: 7.339 (ref 7.250–7.430)
pO2, Ven: 97 mmHg — ABNORMAL HIGH (ref 32.0–45.0)

## 2021-05-08 ENCOUNTER — Telehealth: Payer: Self-pay

## 2021-05-08 ENCOUNTER — Encounter (HOSPITAL_COMMUNITY): Payer: Self-pay | Admitting: Vascular Surgery

## 2021-05-08 NOTE — Discharge Summary (Signed)
[facility] Discharge Summary   Patient ID:  Justin Wells MRN: [id number] DOB/AGE: 68-Jan-1952 68 y.o.  Admit date: 05/03/2021 Discharge date: 05/06/21 Date of Surgery: 05/04/2021 Surgeon: Surgeon(s): Maeola Harman, MD Victorino Sparrow, MD  Admission Diagnosis: PAD (peripheral artery disease) [facility]) [I73.9]  Discharge Diagnoses:  PAD (peripheral artery disease) (HCC) [I73.9]  Secondary Diagnoses: Past Medical History:  Diagnosis Date   Anxiety    Arthritis    "hands; back; legs" (04/18/2016)   Barrett's esophagus    Cerebrovascular disease    CHF (congestive heart failure) (HCC)    "said I had this a few years ago; it went away" (04/18/2016)   Chronic back pain    Coronary artery disease    Depression    Gastroesophageal reflux    H/O hiatal hernia    Headache(784.0)    "probably monthly" (01/27/2013)   Hyperlipidemia    Hypertension    Peripheral arterial disease (HCC)    Seizures (HCC)    "daily the last 5 days" (04/18/2016)   Stroke [facility]) 2009  2010   "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  He now has rest pain that is severe in nature of his left foot with severely depressed ABI it is indicated for the above-noted procedure.  We have discussed the risk benefits and alternatives he agrees to proceed.  Hospital Course:  Justin Wells is a 70 y.o. male is S/P Left Procedure(s): COMMON ENDARTERECTOMY FEMORAL WITH LEFT LEG STENT LOWER EXTREMITY ANGIOGRAM INSERTION OF LEFT SUPERFICIAL FEMORAL ARTERY 6 X 80  ELUVIA  STENT PATCH ANGIOPLASTY OF LEFT FEMORAL ARTERY USING 1X6 XENOSURE BOVINE PATCH Foot warm with intact motor and sensation Doppler PT brisk Left groin incision soft without hematoma Blood loss anemia with HGB 8.3 asymptomatic F/u will be arranged for 2-3 weeks for incisional check  Treatment Team:  Maeola Harman, MD  Significant Diagnostic Studies: CBC Lab Results  Component Value Date   WBC 8.5 05/06/2021   HGB 8.3 (L) 05/06/2021   HCT 24.9 (L) 05/06/2021   MCV 82.2 05/06/2021   PLT 148 (L) 05/06/2021    BMET    Component Value Date/Time   NA 136 05/06/2021 0116   K 3.5 05/06/2021 0116   CL 102 05/06/2021 0116   CO2 25 05/06/2021 0116   GLUCOSE 120 (H) 05/06/2021 0116   BUN 10 05/06/2021 0116   CREATININE 0.86 05/06/2021 0116   CALCIUM 8.1 (L) 05/06/2021 0116   GFRNONAA >60 05/06/2021 0116   GFRAA >60 07/18/2016 1238   COAG Lab Results  Component Value Date   INR 1.10 05/08/2016   INR 1.10 04/12/2015   INR 1.03 03/07/2014     Disposition:  Discharge to :Home Discharge Instructions     Call MD for:  redness, tenderness, or signs of infection (pain, swelling, bleeding, redness, odor or green/yellow discharge around incision site)   Complete by: As directed    Call MD for:  severe or increased pain, loss or decreased feeling  in affected limb(s)   Complete by: As directed  Call MD for:  temperature >100.5   Complete by: As directed    Resume previous diet   Complete by: As directed       Allergies as of 05/06/2021   No Known Allergies      Medication List     STOP taking these medications    esomeprazole 40 MG capsule Commonly known as: NEXIUM       TAKE these medications    acetaminophen 500 MG tablet Commonly known as: TYLENOL Take 1,000 mg by mouth every 6 (six) hours as needed for moderate pain.   atorvastatin 80 MG tablet Commonly known as: LIPITOR Take 80 mg by mouth at bedtime.   BC FAST PAIN RELIEF PO Take  1 packet by mouth daily as needed (pain).   clopidogrel 75 MG tablet Commonly known as: PLAVIX Take 1 tablet by mouth once daily   fluticasone 50 MCG/ACT nasal spray Commonly known as: FLONASE Place 2 sprays into both nostrils daily.   HYDROmorphone 4 MG tablet Commonly known as: DILAUDID Take 4 mg by mouth every 4 (four) hours as needed (for breakthrough pain).   isosorbide mononitrate 60 MG 24 hr tablet Commonly known as: IMDUR Take 60 mg by mouth every morning.   levETIRAcetam 750 MG tablet Commonly known as: KEPPRA Take 1 tablet (750 mg total) by mouth 2 (two) times daily. Take 1.5 tablets twice daily What changed: additional instructions   lisinopril 5 MG tablet Commonly known as: ZESTRIL Take 1 tablet (5 mg total) by mouth daily.   metoprolol succinate 25 MG 24 hr tablet Commonly known as: TOPROL-XL Take 25 mg by mouth daily.   mirtazapine 15 MG tablet Commonly known as: REMERON Take 15 mg by mouth at bedtime.   morphine 15 MG 12 hr tablet Commonly known as: MS CONTIN Take 15 mg by mouth 2 (two) times daily.   nitroGLYCERIN 0.4 MG SL tablet Commonly known as: NITROSTAT Place 0.4 mg under the tongue every 5 (five) minutes as needed for chest pain.   pantoprazole 40 MG tablet Commonly known as: Protonix Take 1 tablet (40 mg total) by mouth 2 (two) times daily.   PARoxetine 20 MG tablet Commonly known as: PAXIL Take 1 tablet (20 mg total) by mouth every morning.   VISINE OP Place 1 drop into both eyes daily as needed (dry eyes).       Verbal and written Discharge instructions given to the patient. Wound care per Discharge AVS  Follow-up Information     Maeola Harman, MD Follow up in 2 week(s).   Specialties: Vascular Surgery, Cardiology Why: Office will call you to arrange your appt (sent) Contact information: [address] [place] [address] [phone]                 Signed: Mosetta Pigeon 05/08/2021, 1:00 PM

## 2021-05-08 NOTE — Telephone Encounter (Signed)
Patient's wife calls today to report that patient's groin has been swollen since Sunday and it has been very hot and hard since last night. He is s/p endarterectomy by [facility] on 05/04/2021. The site has been draining just a little bit, denies chills and fever. Placed on [facility] schedule for tomorrow.

## 2021-05-09 ENCOUNTER — Other Ambulatory Visit: Payer: Self-pay

## 2021-05-09 ENCOUNTER — Encounter: Payer: Self-pay | Admitting: Vascular Surgery

## 2021-05-09 ENCOUNTER — Encounter (HOSPITAL_COMMUNITY): Payer: Medicare HMO

## 2021-05-09 ENCOUNTER — Ambulatory Visit (INDEPENDENT_AMBULATORY_CARE_PROVIDER_SITE_OTHER): Payer: Medicare HMO | Admitting: Vascular Surgery

## 2021-05-09 ENCOUNTER — Ambulatory Visit: Payer: Medicare HMO | Admitting: Vascular Surgery

## 2021-05-09 VITALS — BP 148/78 | HR 82 | Temp 97.7°F | Resp 20 | Ht 70.0 in | Wt 197.0 lb

## 2021-05-09 DIAGNOSIS — M7989 Other specified soft tissue disorders: Secondary | ICD-10-CM

## 2021-05-09 MED ORDER — SULFAMETHOXAZOLE-TRIMETHOPRIM 400-80 MG PO TABS: 1.0000 | ORAL_TABLET | Freq: Two times a day (BID) | ORAL | 0 refills | Status: DC

## 2021-05-09 NOTE — Progress Notes (Signed)
     Subjective:     Patient ID: Justin Wells, male   DOB: 10-Aug-1951, 70 y.o.   MRN: [id number]  HPI 70 year old male status post left common femoral endarterectomy with patch angioplasty and antegrade left SFA stenting.  He now has significant swelling.  Significant bruising in the left groin that is quite uncomfortable.  He has had low-grade fever no high-grade fevers no drainage from his left groin wound.  Denies further rest pain left leg.   Review of Systems Left leg swelling Left groin swelling    Objective:   Physical Exam Vitals:   05/09/21 0956  BP: (!) 148/78  Pulse: 82  Resp: 20  Temp: 97.7 F (36.5 C)  SpO2: 95%  Awake alert oriented Ecchymosis left groin Pitting edema left leg below the knee Strong peroneal and posterior tibial signals on the left     Assessment:     70 year old male status post left common femoral endarterectomy with antegrade left SFA stenting.  He is here with ecchymosis of his left groin and swelling below the left leg.  Patient did have rest pain of his left foot which is now resolved.    Plan:     Bactrim 10-day supply sent to [facility] in [place] Patient will follow-up with me in 2 weeks for wound check     Tilman Mcclaren C. Randie Heinz, MD [facility] [place] Office: [phone] Pager: [phone]

## 2021-05-23 ENCOUNTER — Ambulatory Visit (INDEPENDENT_AMBULATORY_CARE_PROVIDER_SITE_OTHER): Payer: Medicare HMO | Admitting: Vascular Surgery

## 2021-05-23 ENCOUNTER — Other Ambulatory Visit: Payer: Self-pay

## 2021-05-23 ENCOUNTER — Encounter: Payer: Self-pay | Admitting: Vascular Surgery

## 2021-05-23 VITALS — BP 118/76 | HR 61 | Temp 97.9°F | Resp 20 | Ht 70.0 in | Wt 194.0 lb

## 2021-05-23 DIAGNOSIS — M7989 Other specified soft tissue disorders: Secondary | ICD-10-CM

## 2021-05-23 NOTE — Progress Notes (Signed)
     Subjective:     Patient ID: Justin Wells, male   DOB: 1951-05-24, 70 y.o.   MRN: [id number]  HPI 70 year old male follows up for wound check after left common femoral endarterectomy and antegrade left SFA stent.  At last visit he had significant swelling in the left leg and also swelling in the groin.  These are beginning to slightly improved.  Hematoma in the left groin resolving.  Overall he is feeling much better at this time.   Review of Systems Improved left lower extremity swelling    Objective:   Physical Exam. Vitals:   05/23/21 1156  BP: 118/76  Pulse: 61  Resp: 20  Temp: 97.9 F (36.6 C)  SpO2: 97%   Awake alert oriented On lab respirations 1+ edema much improved right lower extremity Induration left groin without any evidence of infection there is a strong palpable left common femoral pulse Biphasic left posterior tibial artery signal    Assessment/plan     70 year old male here for wound check left lower extremity after above-noted surgery.  He appears to be improving with improved edema and ecchymosis in left groin and his foot is well-perfused.  He will follow-up in 4 to 6 weeks with noninvasive imaging as regularly scheduled.    Alazae Crymes C. Randie Heinz, MD [facility] Office: [phone] Pager: [phone]

## 2021-05-24 ENCOUNTER — Other Ambulatory Visit: Payer: Self-pay

## 2021-05-24 DIAGNOSIS — Z9889 Other specified postprocedural states: Secondary | ICD-10-CM

## 2021-05-24 DIAGNOSIS — I70229 Atherosclerosis of native arteries of extremities with rest pain, unspecified extremity: Secondary | ICD-10-CM

## 2021-06-08 ENCOUNTER — Telehealth: Payer: Self-pay

## 2021-06-08 NOTE — Telephone Encounter (Signed)
Patient's wife calls at 1630 to report that patient's left thigh is raw and tight and painful. Apparently "raw" is describing a "burning sensation in the muscle.' He did have a stent placed in the left leg on 9/2 along with a femoral endarterectomy. Says his feet hurt, but only sometimes. They do not currently hurt. He has leg cramps but only at night. His foot is cold sometimes but not discolored. The swelling is worse at the end of the day. Advised we were already closed and not back in the office til Monday. Instructed leg elevation above the level of the heart and if other issues developed over the weekend or the pain became unbearable to report to the ED. The wife has cataract surgery on Monday but says she will call about patient prior to her procedure.

## 2021-07-11 ENCOUNTER — Ambulatory Visit (INDEPENDENT_AMBULATORY_CARE_PROVIDER_SITE_OTHER)
Admission: RE | Admit: 2021-07-11 | Discharge: 2021-07-11 | Disposition: A | Discharge status: Home / Self Care | Payer: Medicare HMO | Source: Ambulatory Visit | Attending: Vascular Surgery | Admitting: Vascular Surgery

## 2021-07-11 ENCOUNTER — Ambulatory Visit (INDEPENDENT_AMBULATORY_CARE_PROVIDER_SITE_OTHER): Payer: Medicare HMO | Admitting: Physician Assistant

## 2021-07-11 ENCOUNTER — Ambulatory Visit (HOSPITAL_COMMUNITY)
Admission: RE | Admit: 2021-07-11 | Discharge: 2021-07-11 | Disposition: A | Discharge status: Home / Self Care | Payer: Medicare HMO | Source: Ambulatory Visit | Attending: Vascular Surgery | Admitting: Vascular Surgery

## 2021-07-11 ENCOUNTER — Other Ambulatory Visit: Payer: Self-pay

## 2021-07-11 VITALS — BP 128/65 | HR 54 | Temp 98.0°F | Resp 20 | Ht 70.0 in | Wt 194.5 lb

## 2021-07-11 DIAGNOSIS — I70229 Atherosclerosis of native arteries of extremities with rest pain, unspecified extremity: Secondary | ICD-10-CM

## 2021-07-11 DIAGNOSIS — I70222 Atherosclerosis of native arteries of extremities with rest pain, left leg: Secondary | ICD-10-CM

## 2021-07-11 DIAGNOSIS — I739 Peripheral vascular disease, unspecified: Secondary | ICD-10-CM

## 2021-07-11 DIAGNOSIS — Z9889 Other specified postprocedural states: Secondary | ICD-10-CM | POA: Insufficient documentation

## 2021-07-11 NOTE — Progress Notes (Signed)
Peripheral Arterial Disease Follow-Up   VASCULAR SURGERY ASSESSMENT & PLAN:   Justin Wells is a 70 y.o. male returns for routine follow-up. His wife accompanies him today.  CC:  F/u for surgery, 2+ months post-op  HPI:  This is a 70 y.o. male who is s/p left common femoral endarterectomy with bovine pericardial patch angioplasty, stent of left SFA popliteal segment with 6 x 80 mm Elluvia by Dr. Randie Heinz on 05/04/2021. This was performed secondary to critical left lower extremity ischemia with rest pain.  He has known flush occlusion of right SFA with reconstitution of distal SFA nd 2 vessel run-off. Prior history of left SFA and popliteal stenting; atherectomy and stenting of right SFA.  Stable peripheral arterial disease.  He has significant improvement in left ABI after intervention and mild improvement in right ABI as compared to April, 2022.  Patient is without claudication or rest pain. Continue optimal medical management of  hypertension and follow-up with primary care physician. Encouraged continuation of complete smoking cessation. Continue the following medications: Plavix, statin Follow-up in 3 to 4 weeks for right lower extremity arterial duplex. Follow up in 6 months with bilateral lower extremity arterial duplex and ABIs.  SUBJECTIVE:   He complains of remittent right lower extremity instability and pain at times with right heel strike when walking.  He denies lower extremity pain with exercise or rest pain.  Denies skin loss or ulceration.  PHYSICAL EXAM:   Vitals:   07/11/21 1050  BP: 128/65  Pulse: (!) 54  Resp: 20  Temp: 98 F (36.7 C)  TempSrc: Temporal  SpO2: 98%  Weight: 194 lb 8 oz (88.2 kg)  Height: 5\' 10"  (1.778 m)    General appearance: Well-developed, well-nourished in no apparent distress Neurologic: Alert and oriented x 4. Cardiovascular: Heart rate and rhythm are regular.   Extremities: Skin intact.  Both feet are warm and well perfused.  Motor  function and sensation intact. Left groin incision is well-healed. Pulse exam: 2+ left femoral, 2+ left posterior tibial palpable pulses.  Unable to palpate right femoral or right pedal pulses.  He has monophasic right DP, PT and peroneal Doppler signals.  NON-INVASIVE VASCULAR STUDIES   07/11/2021 ABIs: ABI/TBIToday's ABIToday's TBIPrevious ABIPrevious TBI  +-------+-----------+-----------+------------+------------+  Right  0.78       0.33       0.61        0.30          +-------+-----------+-----------+------------+------------+  Left   0.89       0.56       0.40        0.30          +-------+-----------+-----------+------------+------------+   Bilateral ABIs appear increased compared to prior study on 04/30/2021.     Summary:  Right: Resting right ankle-brachial index indicates moderate right lower  extremity arterial disease. The right toe-brachial index is abnormal.   Left: Resting left ankle-brachial index indicates mild left lower  extremity arterial disease. The left toe-brachial index is abnormal.     *See table(s) above for measurements and observations.    Electronically signed by 05/02/2021 MD on 07/11/2021 at 11:01:36 AM.        Final     LLE arterial duplex: Summary:  Left: Left SFA/popliteal stent is patent with increased velocities in the  proximal stent suggesting 50-99% stenosis. However, no obvious disease is  visualized.     See table(s) above for measurements and observations.     Preliminary  PROBLEM LIST:    The patient's past medical history, past surgical history, family history, social history, allergy list and medication list are reviewed.   CURRENT MEDS:    Current Outpatient Medications:    acetaminophen (TYLENOL) 500 MG tablet, Take 1,000 mg by mouth every 6 (six) hours as needed for moderate pain., Disp: , Rfl:    Aspirin-Caffeine (BC FAST PAIN RELIEF PO), Take 1 packet by mouth daily as needed (pain)., Disp: ,  Rfl:    atorvastatin (LIPITOR) 80 MG tablet, Take 80 mg by mouth at bedtime., Disp: , Rfl:    clopidogrel (PLAVIX) 75 MG tablet, Take 1 tablet by mouth once daily, Disp: 90 tablet, Rfl: 3   fluticasone (FLONASE) 50 MCG/ACT nasal spray, Place 2 sprays into both nostrils daily., Disp: , Rfl:    HYDROmorphone (DILAUDID) 4 MG tablet, Take 4 mg by mouth every 4 (four) hours as needed (for breakthrough pain)., Disp: , Rfl:    isosorbide mononitrate (IMDUR) 60 MG 24 hr tablet, Take 60 mg by mouth every morning., Disp: , Rfl:    levETIRAcetam (KEPPRA) 750 MG tablet, Take 1 tablet (750 mg total) by mouth 2 (two) times daily. Take 1.5 tablets twice daily (Patient taking differently: Take 750 mg by mouth 2 (two) times daily.), Disp: 60 tablet, Rfl: 1   metoprolol succinate (TOPROL-XL) 25 MG 24 hr tablet, Take 25 mg by mouth daily., Disp: , Rfl:    mirtazapine (REMERON) 15 MG tablet, Take 15 mg by mouth at bedtime., Disp: , Rfl:    morphine (MS CONTIN) 15 MG 12 hr tablet, Take 15 mg by mouth 2 (two) times daily., Disp: , Rfl:    nitroGLYCERIN (NITROSTAT) 0.4 MG SL tablet, Place 0.4 mg under the tongue every 5 (five) minutes as needed for chest pain., Disp: , Rfl:    Tetrahydrozoline HCl (VISINE OP), Place 1 drop into both eyes daily as needed (dry eyes)., Disp: , Rfl:    REVIEW OF SYSTEMS:   [X]  denotes positive finding, [ ]  denotes negative finding Cardiac  Comments:  Chest pain or chest pressure:    Shortness of breath upon exertion:    Short of breath when lying flat:    Irregular heart rhythm:        Vascular    Pain in calf, thigh, or hip brought on by ambulation:    Pain in feet at night that wakes you up from your sleep:     Blood clot in your veins:    Leg swelling:         Pulmonary    Oxygen at home:    Productive cough:     Wheezing:         Neurologic    Sudden weakness in arms or legs:     Sudden numbness in arms or legs:     Sudden onset of difficulty speaking or slurred speech:     Temporary loss of vision in one eye:     Problems with dizziness:         Gastrointestinal    Blood in stool:     Vomited blood:         Genitourinary    Burning when urinating:     Blood in urine:        Psychiatric    Major depression:         Hematologic    Bleeding problems:    Problems with blood clotting too easily:  Skin    Rashes or ulcers:        Constitutional    Fever or chills:     Milinda Antis, PA-C  Office: [phone] 07/11/2021 Dr. Randie Heinz

## 2021-07-12 ENCOUNTER — Other Ambulatory Visit: Payer: Self-pay

## 2021-07-12 DIAGNOSIS — I739 Peripheral vascular disease, unspecified: Secondary | ICD-10-CM

## 2021-10-17 ENCOUNTER — Inpatient Hospital Stay (HOSPITAL_COMMUNITY): Admission: RE | Admit: 2021-10-17 | Payer: Medicare HMO | Source: Ambulatory Visit

## 2021-10-17 ENCOUNTER — Ambulatory Visit: Payer: Medicare HMO | Admitting: Vascular Surgery

## 2021-10-17 ENCOUNTER — Encounter (HOSPITAL_COMMUNITY): Payer: Medicare HMO

## 2021-11-28 ENCOUNTER — Encounter: Payer: Self-pay | Admitting: Vascular Surgery

## 2021-11-28 ENCOUNTER — Ambulatory Visit (HOSPITAL_COMMUNITY)
Admission: RE | Admit: 2021-11-28 | Discharge: 2021-11-28 | Disposition: A | Discharge status: Home / Self Care | Payer: Medicare HMO | Source: Ambulatory Visit | Attending: Vascular Surgery | Admitting: Vascular Surgery

## 2021-11-28 ENCOUNTER — Other Ambulatory Visit: Payer: Self-pay

## 2021-11-28 ENCOUNTER — Ambulatory Visit (INDEPENDENT_AMBULATORY_CARE_PROVIDER_SITE_OTHER): Payer: Medicare HMO | Admitting: Vascular Surgery

## 2021-11-28 ENCOUNTER — Ambulatory Visit (INDEPENDENT_AMBULATORY_CARE_PROVIDER_SITE_OTHER)
Admission: RE | Admit: 2021-11-28 | Discharge: 2021-11-28 | Disposition: A | Discharge status: Home / Self Care | Payer: Medicare HMO | Source: Ambulatory Visit | Attending: Physician Assistant | Admitting: Physician Assistant

## 2021-11-28 VITALS — BP 145/77 | HR 59 | Temp 97.5°F | Resp 20 | Ht 70.0 in | Wt 203.0 lb

## 2021-11-28 DIAGNOSIS — M7989 Other specified soft tissue disorders: Secondary | ICD-10-CM

## 2021-11-28 DIAGNOSIS — I739 Peripheral vascular disease, unspecified: Secondary | ICD-10-CM

## 2021-11-28 NOTE — Progress Notes (Signed)
? ?Patient ID: Justin Wells, male   DOB: 03/26/1951, 71 y.o.   MRN: [id number] ? ?Reason for Consult: No chief complaint on file. ?  ?Referred by John Giovanni, MD ? ?Subjective:  ?   ?HPI: ? ?Justin Wells is a 71 y.o. male with previous left common femoral endarterectomy and antegrade left SFA stent.  After this he did have swelling in the left leg and hematoma of the left groin.  Most recently he has had swelling of the bilateral lower extremities which did resolve on the right but remains on the left.  He also had some erythema which has improved.  He has never had a DVT but his primary care doctor was concerned for DVT.  He was on aspirin Plavix he is not on any anticoagulants.  States that he has had some pain in his feet but it is mostly neuropathic in nature.  He does not have any tissue loss or ulceration. ? ?Past Medical History:  ?Diagnosis Date  ? Anxiety   ? Arthritis   ? "hands; back; legs" (04/18/2016)  ? Barrett's esophagus   ? Cerebrovascular disease   ? CHF (congestive heart failure) (HCC)   ? "said I had this a few years ago; it went away" (04/18/2016)  ? Chronic back pain   ? Coronary artery disease   ? Depression   ? Gastroesophageal reflux   ? H/O hiatal hernia   ? Headache(784.0)   ? "probably monthly" (01/27/2013)  ? Hyperlipidemia   ? Hypertension   ? Peripheral arterial disease (HCC)   ? Seizures (HCC)   ? "daily the last 5 days" (04/18/2016)  ? Stroke [facility]) 2009  2010  ? "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, weak right leg and hand; I've had 2 or 3 strokes; can't remember the dates" (01/27/2013)  ? ?Family History  ?Problem Relation Age of Onset  ? Heart disease Mother   ?     Heart Disease before age 82  ? Hypertension Mother   ? Heart attack Mother   ? Heart attack Father   ? ?Past Surgical History:  ?Procedure Laterality Date  ? ABDOMINAL AORTAGRAM N/A 01/26/2013  ? Procedure: ABDOMINAL AORTAGRAM;  Surgeon: Nada Libman, MD;  Location: [facility] CATH LAB;  Service: Cardiovascular;   Laterality: N/A;  ? ABDOMINAL AORTOGRAM W/LOWER EXTREMITY Bilateral 05/03/2021  ? Procedure: ABDOMINAL AORTOGRAM W/LOWER EXTREMITY;  Surgeon: Cephus Shelling, MD;  Location: [facility] INVASIVE CV LAB;  Service: Cardiovascular;  Laterality: Bilateral;  ? CARDIAC CATHETERIZATION    ? CHOLECYSTECTOMY N/A 05/08/2016  ? Procedure: LAPAROSCOPIC CHOLECYSTECTOMY WITH INTRAOPERATIVE CHOLANGIOGRAM;  Surgeon: Manus Rudd, MD;  Location: [facility] OR;  Service: General;  Laterality: N/A;  ? COLONOSCOPY    ? CORONARY ANGIOPLASTY WITH STENT PLACEMENT  11/02/2007  ? "2" (01/27/2013)  ? DIAGNOSTIC LAPAROSCOPY    ? "twice after nissen; it kept coming apart " (01/27/2013)  ? ENDARTERECTOMY FEMORAL Left 05/04/2021  ? Procedure: COMMON ENDARTERECTOMY FEMORAL WITH LEFT LEG STENT;  Surgeon: Maeola Harman, MD;  Location: [facility] OR;  Service: Vascular;  Laterality: Left;  ? ESOPHAGOGASTRODUODENOSCOPY    ? FEMORAL ARTERY STENT Bilateral 01/26/2013  ? HERNIA REPAIR    ? INSERTION OF ILIAC STENT Left 05/04/2021  ? Procedure: INSERTION OF LEFT SUPERFICIAL FEMORAL ARTERY 6 X 80 ELUVIA  STENT;  Surgeon: Maeola Harman, MD;  Location: [facility] OR;  Service: Vascular;  Laterality: Left;  ? LAPAROSCOPIC GASTRIC BANDING  2010  ? LAPAROSCOPIC NISSEN FUNDOPLICATION  ?  2001  ? LEFT HEART CATHETERIZATION WITH CORONARY ANGIOGRAM N/A 03/09/2014  ? Procedure: LEFT HEART CATHETERIZATION WITH CORONARY ANGIOGRAM;  Surgeon: Iran Ouch, MD;  Location: [facility] CATH LAB;  Service: Cardiovascular;  Laterality: N/A;  ? LOWER EXTREMITY ANGIOGRAM Left 03/02/2013  ? Procedure: LOWER EXTREMITY ANGIOGRAM;  Surgeon: Nada Libman, MD;  Location: [facility] CATH LAB;  Service: Cardiovascular;  Laterality: Left;  ? LOWER EXTREMITY ANGIOGRAM Left 05/04/2021  ? Procedure: LOWER EXTREMITY ANGIOGRAM;  Surgeon: Maeola Harman, MD;  Location: [facility] OR;  Service: Vascular;  Laterality: Left;  ? PATCH ANGIOPLASTY  05/04/2021  ? Procedure: PATCH ANGIOPLASTY OF LEFT FEMORAL ARTERY USING 1X6  XENOSURE BOVINE PATCH;  Surgeon: Maeola Harman, MD;  Location: [facility] OR;  Service: Vascular;;  ? ? ?Short Social History:  ?Social History  ? ?Tobacco Use  ? Smoking status: Former  ?  Packs/day: 2.00  ?  Years: 35.00  ?  Pack years: 70.00  ?  Types: Cigarettes  ?  Quit date: 01/29/2000  ?  Years since quitting: 21.8  ? Smokeless tobacco: Never  ?Substance Use Topics  ? Alcohol use: No  ?  Comment: 04/18/2016  "quit drinking in 07/1999; I'm a recovering alcoholic"  ? ? ?No Known Allergies ? ?Current Outpatient Medications  ?Medication Sig Dispense Refill  ? acetaminophen (TYLENOL) 500 MG tablet Take 1,000 mg by mouth every 6 (six) hours as needed for moderate pain.    ? Aspirin-Caffeine (BC FAST PAIN RELIEF PO) Take 1 packet by mouth daily as needed (pain).    ? atorvastatin (LIPITOR) 80 MG tablet Take 80 mg by mouth at bedtime.    ? clopidogrel (PLAVIX) 75 MG tablet Take 1 tablet by mouth once daily 90 tablet 3  ? fluticasone (FLONASE) 50 MCG/ACT nasal spray Place 2 sprays into both nostrils daily.    ? HYDROmorphone (DILAUDID) 4 MG tablet Take 4 mg by mouth every 4 (four) hours as needed (for breakthrough pain).    ? isosorbide mononitrate (IMDUR) 60 MG 24 hr tablet Take 60 mg by mouth every morning.    ? levETIRAcetam (KEPPRA) 750 MG tablet Take 1 tablet (750 mg total) by mouth 2 (two) times daily. Take 1.5 tablets twice daily (Patient taking differently: Take 750 mg by mouth 2 (two) times daily.) 60 tablet 1  ? metoprolol succinate (TOPROL-XL) 25 MG 24 hr tablet Take 25 mg by mouth daily.    ? mirtazapine (REMERON) 15 MG tablet Take 15 mg by mouth at bedtime.    ? morphine (MS CONTIN) 15 MG 12 hr tablet Take 15 mg by mouth 2 (two) times daily.    ? nitroGLYCERIN (NITROSTAT) 0.4 MG SL tablet Place 0.4 mg under the tongue every 5 (five) minutes as needed for chest pain.    ? Tetrahydrozoline HCl (VISINE OP) Place 1 drop into both eyes daily as needed (dry eyes).    ? ?No current facility-administered  medications for this visit.  ? ? ?Review of Systems  ?Constitutional:  Constitutional negative. ?HENT: HENT negative.  ?Eyes: Eyes negative.  ?Respiratory: Respiratory negative.  ?Cardiovascular: Positive for leg swelling.  ?GI: Gastrointestinal negative.  ?Neurological: Positive for numbness.  ?Hematologic: Hematologic/lymphatic negative.  ?Psychiatric: Psychiatric negative.   ? ?   ?Objective:  ?Objective  ?Vitals:  ? 11/28/21 1140  ?BP: (!) 145/77  ?Pulse: (!) 59  ?Resp: 20  ?Temp: (!) 97.5 ?F (36.4 ?C)  ?SpO2: 97%  ? ? ? ?Physical Exam ?HENT:  ?   Head:  Normocephalic.  ?   Nose: Nose normal.  ?   Mouth/Throat:  ?   Mouth: Mucous membranes are moist.  ?Eyes:  ?   Pupils: Pupils are equal, round, and reactive to light.  ?Cardiovascular:  ?   Pulses:     ?     Femoral pulses are 2+ on the right side and 2+ on the left side. ?Abdominal:  ?   General: Abdomen is flat.  ?   Palpations: Abdomen is soft.  ?Musculoskeletal:  ?   Right lower leg: No edema.  ?   Left lower leg: Edema present.  ?Skin: ?   General: Skin is warm.  ?   Capillary Refill: Capillary refill takes less than 2 seconds.  ?Neurological:  ?   General: No focal deficit present.  ?   Mental Status: He is alert.  ?Psychiatric:     ?   Mood and Affect: Mood normal.  ? ? ?Data: ?ABI Findings:  ?+---------+------------------+-----+----------+--------+  ?Right    Rt Pressure (mmHg)IndexWaveform  Comment   ?+---------+------------------+-----+----------+--------+  ?Brachial 153                                        ?+---------+------------------+-----+----------+--------+  ?PTA      126               0.82 monophasic          ?+---------+------------------+-----+----------+--------+  ?DP       129               0.84 monophasic          ?+---------+------------------+-----+----------+--------+  ?Great Toe64                0.42                     ?+---------+------------------+-----+----------+--------+   ? ?+---------+------------------+-----+----------+-------+  ?Left     Lt Pressure (mmHg)IndexWaveform  Comment  ?+---------+------------------+-----+----------+-------+  ?Brachial 151                                       ?+---------+------------------+-----+----------+

## 2021-12-12 ENCOUNTER — Other Ambulatory Visit: Payer: Self-pay | Admitting: *Deleted

## 2021-12-12 DIAGNOSIS — M7989 Other specified soft tissue disorders: Secondary | ICD-10-CM

## 2022-01-09 ENCOUNTER — Ambulatory Visit (HOSPITAL_COMMUNITY): Payer: Medicare HMO

## 2022-01-09 ENCOUNTER — Ambulatory Visit: Payer: Medicare HMO | Admitting: Vascular Surgery

## 2022-02-13 ENCOUNTER — Ambulatory Visit (INDEPENDENT_AMBULATORY_CARE_PROVIDER_SITE_OTHER): Payer: Medicare HMO | Admitting: Vascular Surgery

## 2022-02-13 ENCOUNTER — Encounter: Payer: Self-pay | Admitting: Vascular Surgery

## 2022-02-13 ENCOUNTER — Ambulatory Visit (HOSPITAL_COMMUNITY)
Admission: RE | Admit: 2022-02-13 | Discharge: 2022-02-13 | Disposition: A | Discharge status: Home / Self Care | Payer: Medicare HMO | Source: Ambulatory Visit | Attending: Vascular Surgery | Admitting: Vascular Surgery

## 2022-02-13 VITALS — BP 126/75 | HR 55 | Temp 98.0°F | Resp 20 | Wt 206.0 lb

## 2022-02-13 DIAGNOSIS — M7989 Other specified soft tissue disorders: Secondary | ICD-10-CM

## 2022-02-13 DIAGNOSIS — I739 Peripheral vascular disease, unspecified: Secondary | ICD-10-CM

## 2022-02-13 NOTE — Progress Notes (Signed)
Patient ID: Justin Wells, male   DOB: [birth year], 71 y.o.   MRN: [id number]  Reason for Consult: Follow-up   Referred by Jacklynn Ganong, MD  Subjective:     HPI:  Justin Wells is a 71 y.o. 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  At last follow-up he was having severe swelling in his left lower extremity which had not improved since surgery he did not have any evidence of DVT at that time.  At that time he is not a good candidate for compression given the size of his leg.  He now follows up with venous reflux.  He states that he does have swelling in the left leg but that it is improving.  He also has numbness of the anterior left thigh which has been stable since surgery.  He has not had any tissue loss or ulceration and rest pain has resolved.  Past Medical History:  Diagnosis Date   Anxiety    Arthritis    "hands; back; legs" (04/18/2016)   Barrett's esophagus    Cerebrovascular disease    CHF (congestive heart failure) ([place])    "said I had this a few years ago; it went away" (04/18/2016)   Chronic back pain    Coronary artery disease    Depression    Gastroesophageal reflux    H/O hiatal hernia    Headache(784.0)    "probably monthly" (01/27/2013)   Hyperlipidemia    Hypertension    Peripheral arterial disease ([place])    Seizures ([place])    "daily the last 5 days" (04/18/2016)   Stroke [facility]) 2009  2010   "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, weak right leg and hand; I've had 2 or 3 strokes; can't remember the dates" (01/27/2013)   Family History  Problem Relation Age of Onset   Heart disease Mother        Heart Disease before age 32   Hypertension Mother    Heart attack Mother    Heart attack Father    Past Surgical History:  Procedure Laterality Date   ABDOMINAL AORTAGRAM N/A 01/26/2013   Procedure: ABDOMINAL  Maxcine Ham;  Surgeon: Serafina Mitchell, MD;  Location: [facility] CATH LAB;  Service: Cardiovascular;  Laterality: N/A;   ABDOMINAL AORTOGRAM W/LOWER EXTREMITY Bilateral 05/03/2021   Procedure: ABDOMINAL AORTOGRAM W/LOWER EXTREMITY;  Surgeon: Marty Heck, MD;  Location: [place] CV LAB;  Service: Cardiovascular;  Laterality: Bilateral;   CARDIAC CATHETERIZATION     CHOLECYSTECTOMY N/A 05/08/2016   Procedure: LAPAROSCOPIC CHOLECYSTECTOMY WITH INTRAOPERATIVE CHOLANGIOGRAM;  Surgeon: Donnie Mesa, MD;  Location: [place];  Service: General;  Laterality: N/A;   COLONOSCOPY     CORONARY ANGIOPLASTY WITH STENT PLACEMENT  11/02/2007   "2" (01/27/2013)   DIAGNOSTIC LAPAROSCOPY     "twice after nissen; it kept coming apart " (01/27/2013)   ENDARTERECTOMY FEMORAL Left 05/04/2021   Procedure: COMMON ENDARTERECTOMY FEMORAL WITH LEFT LEG STENT;  Surgeon: Waynetta Sandy, MD;  Location: [place];  Service: Vascular;  Laterality: Left;   ESOPHAGOGASTRODUODENOSCOPY     FEMORAL ARTERY STENT Bilateral 01/26/2013   HERNIA REPAIR     INSERTION OF ILIAC STENT Left 05/04/2021   Procedure: INSERTION OF LEFT SUPERFICIAL FEMORAL ARTERY 6 X 80 ELUVIA  STENT;  Surgeon: Waynetta Sandy, MD;  Location: [place];  Service: Vascular;  Laterality: Left;   LAPAROSCOPIC GASTRIC BANDING  2010   LAPAROSCOPIC NISSEN FUNDOPLICATION  ?[id number]   LEFT HEART CATHETERIZATION WITH CORONARY ANGIOGRAM N/A 03/09/2014   Procedure: LEFT HEART CATHETERIZATION WITH CORONARY ANGIOGRAM;  Surgeon: Wellington Hampshire, MD;  Location: [place] CATH LAB;  Service: Cardiovascular;  Laterality: N/A;   LOWER EXTREMITY ANGIOGRAM Left 03/02/2013   Procedure: LOWER EXTREMITY ANGIOGRAM;  Surgeon: Serafina Mitchell, MD;  Location: [facility] CATH LAB;  Service: Cardiovascular;  Laterality: Left;   LOWER EXTREMITY ANGIOGRAM Left 05/04/2021   Procedure: LOWER EXTREMITY ANGIOGRAM;  Surgeon: Waynetta Sandy, MD;  Location: [employer];  Service: Vascular;  Laterality: Left;    PATCH ANGIOPLASTY  05/04/2021   Procedure: PATCH ANGIOPLASTY OF LEFT FEMORAL ARTERY USING 1X6 [place];  Surgeon: Waynetta Sandy, MD;  Location: [place];  Service: Vascular;;    Short Social History:  Social History   Tobacco Use   Smoking status: Former    Packs/day: 2.00    Years: 35.00    Total pack years: 70.00    Types: Cigarettes    Quit date: 01/29/2000    Years since quitting: 22.0   Smokeless tobacco: Never  Substance Use Topics   Alcohol use: No    Comment: 04/18/2016  "quit drinking in 07/1999; I'm a recovering alcoholic"    No Known Allergies  Current Outpatient Medications  Medication Sig Dispense Refill   acetaminophen (TYLENOL) 500 MG tablet Take 1,000 mg by mouth every 6 (six) hours as needed for moderate pain.     Aspirin-Caffeine (BC FAST PAIN RELIEF PO) Take 1 packet by mouth daily as needed (pain).     atorvastatin (LIPITOR) 80 MG tablet Take 80 mg by mouth at bedtime.     clopidogrel (PLAVIX) 75 MG tablet Take 1 tablet by mouth once daily 90 tablet 3   fluticasone (FLONASE) 50 MCG/ACT nasal spray Place 2 sprays into both nostrils daily.     HYDROmorphone (DILAUDID) 4 MG tablet Take 4 mg by mouth every 4 (four) hours as needed (for breakthrough pain).     isosorbide mononitrate (IMDUR) 60 MG 24 hr tablet Take 60 mg by mouth every morning.     levETIRAcetam (KEPPRA) 750 MG tablet Take 1 tablet (750 mg total) by mouth 2 (two) times daily. Take 1.5 tablets twice daily (Patient taking differently: Take 750 mg by mouth 2 (two) times daily.) 60 tablet 1   metoprolol succinate (TOPROL-XL) 25 MG 24 hr tablet Take 25 mg by mouth daily.     mirtazapine (REMERON) 15 MG tablet Take 15 mg by mouth at bedtime.     morphine (MS CONTIN) 15 MG 12 hr tablet Take 15 mg by mouth 2 (two) times daily.     nitroGLYCERIN (NITROSTAT) 0.4 MG SL tablet Place 0.4 mg under the tongue every 5 (five) minutes as needed for chest pain.     Tetrahydrozoline HCl (VISINE OP)  Place 1 drop into both eyes daily as needed (dry eyes).     No current facility-administered medications for this visit.    Review of Systems  HENT: HENT negative.  Eyes: Eyes negative.  Cardiovascular: Positive for leg swelling.  GI: Gastrointestinal negative.  Musculoskeletal: Musculoskeletal negative.  Skin: Skin negative.  Neurological: Positive for numbness.  Hematologic: Hematologic/lymphatic negative.  Psychiatric: Psychiatric negative.        Objective:  Objective   Vitals:   02/13/22 1153  BP: 126/75  Pulse: (!) 55  Resp: 20  Temp: 98 F (36.7  C)  SpO2: 97%  Weight: 206 lb (93.4 kg)   Body mass index is 29.56 kg/m.  Physical Exam HENT:     Head: Normocephalic.     Nose: Nose normal.  Eyes:     Pupils: Pupils are equal, round, and reactive to light.  Abdominal:     General: Abdomen is flat.     Palpations: Abdomen is soft.  Musculoskeletal:     Right lower leg: No edema.     Left lower leg: Edema present.  Skin:    General: Skin is warm and dry.     Capillary Refill: Capillary refill takes less than 2 seconds.  Neurological:     General: No focal deficit present.     Mental Status: He is alert.  Psychiatric:        Mood and Affect: Mood normal.     Data: LEFT          Reflux NoRefluxReflux TimeDiameter cmsComments                                        Yes                                                 +--------------+---------+------+-----------+------------+-----------------  ----+  CFV                     yes   >1 second                                     +--------------+---------+------+-----------+------------+-----------------  ----+  FV mid        no                                                            +--------------+---------+------+-----------+------------+-----------------  ----+  Popliteal     no                                                             +--------------+---------+------+-----------+------------+-----------------  ----+  GSV at [facility]              yes    >500 ms      0.50    vein that runs  the                                                         same course as  GSV  visualized                                                                  originating at  [facility]                                                         and joins GSV in  the                                                       proximal calf            +--------------+---------+------+-----------+------------+-----------------  ----+  GSV prox thigh          yes    >500 ms      0.28                            +--------------+---------+------+-----------+------------+-----------------  ----+  GSV mid thigh           yes    >500 ms      0.30                            +--------------+---------+------+-----------+------------+-----------------  ----+  GSV dist thighno                            0.27                            +--------------+---------+------+-----------+------------+-----------------  ----+  GSV at knee             yes    >500 ms      0.32                            +--------------+---------+------+-----------+------------+-----------------  ----+  GSV prox calf           yes    >500 ms      0.32                            +--------------+---------+------+-----------+------------+-----------------  ----+  SSV Pop Fossa no                            0.33                            +--------------+---------+------+-----------+------------+-----------------  ----+  SSV prox calf no                            0.37                            +--------------+---------+------+-----------+------------+-----------------  ----+  SSV mid calf  no                            0.20                             +--------------+---------+------+-----------+------------+-----------------  ----+          Summary:  Left:  - No evidence of deep vein thrombosis seen in the left lower extremity,  from the common femoral through the popliteal veins.  - No evidence of superficial venous thrombosis in the left lower  extremity.     - Venous reflux is noted in the left common femoral vein.  - Venous reflux is noted in the left sapheno-femoral junction.  - Venous reflux is noted in the left greater saphenous vein in the thigh.  - Venous reflux is noted in the left greater saphenous vein in the calf.         Assessment/Plan:   71 year old male status post left lower extremity revascularization now with swelling of the left lower extremity which is improving but remains uncomfortable for him.  At this time his leg does appear amenable to compression stockings I have given him information for elastic therapy as he lives in [place].  His feet do appear well perfused he has no rest pain at this time.  Hopefully his compression stockings will help and he will continue to see improvements in his edema we will see him back in 6 months with left lower extremity duplex and ABIs.     Waynetta Sandy MD [facility]

## 2022-02-19 ENCOUNTER — Other Ambulatory Visit: Payer: Self-pay | Admitting: *Deleted

## 2022-02-19 DIAGNOSIS — I739 Peripheral vascular disease, unspecified: Secondary | ICD-10-CM

## 2022-02-19 DIAGNOSIS — Z9889 Other specified postprocedural states: Secondary | ICD-10-CM

## 2022-02-19 DIAGNOSIS — M25569 Pain in unspecified knee: Secondary | ICD-10-CM

## 2022-08-29 ENCOUNTER — Other Ambulatory Visit: Payer: Self-pay | Admitting: Student

## 2022-08-29 DIAGNOSIS — M47816 Spondylosis without myelopathy or radiculopathy, lumbar region: Secondary | ICD-10-CM

## 2022-09-11 NOTE — Progress Notes (Signed)
Fax received for medical clearance/medication hold for Dr. Cain.  Provider signed, form faxed back to sender, verified successful, sent to scan center.  

## 2022-09-17 ENCOUNTER — Other Ambulatory Visit: Payer: Medicare HMO

## 2022-09-17 ENCOUNTER — Inpatient Hospital Stay: Admission: RE | Admit: 2022-09-17 | Payer: Medicare HMO | Source: Ambulatory Visit

## 2022-09-26 ENCOUNTER — Ambulatory Visit
Admission: RE | Admit: 2022-09-26 | Discharge: 2022-09-26 | Disposition: A | Discharge status: Home / Self Care | Payer: Medicare HMO | Source: Ambulatory Visit | Attending: Student | Admitting: Student

## 2022-09-26 DIAGNOSIS — M47816 Spondylosis without myelopathy or radiculopathy, lumbar region: Secondary | ICD-10-CM

## 2022-09-26 MED ORDER — IOPAMIDOL (ISOVUE-M 200) INJECTION 41%
20.0000 mL | Freq: Once | INTRAMUSCULAR | Status: AC
  Administered 2022-09-26: 20 mL via INTRATHECAL

## 2022-09-26 MED ORDER — MEPERIDINE HCL 50 MG/ML IJ SOLN: 50.0000 mg | Freq: Once | INTRAMUSCULAR | Status: DC | PRN

## 2022-09-26 MED ORDER — ONDANSETRON HCL 4 MG/2ML IJ SOLN: 4.0000 mg | Freq: Once | INTRAMUSCULAR | Status: DC | PRN

## 2022-09-26 MED ORDER — DIAZEPAM 5 MG PO TABS
5.0000 mg | ORAL_TABLET | Freq: Once | ORAL | Status: AC
  Administered 2022-09-26: 5 mg via ORAL

## 2022-09-26 NOTE — Discharge Instructions (Signed)

## 2022-10-30 ENCOUNTER — Other Ambulatory Visit: Payer: Self-pay | Admitting: *Deleted

## 2022-10-30 DIAGNOSIS — I70229 Atherosclerosis of native arteries of extremities with rest pain, unspecified extremity: Secondary | ICD-10-CM

## 2022-10-30 DIAGNOSIS — I739 Peripheral vascular disease, unspecified: Secondary | ICD-10-CM

## 2022-11-08 ENCOUNTER — Ambulatory Visit (HOSPITAL_COMMUNITY): Payer: Medicare HMO

## 2022-11-08 ENCOUNTER — Ambulatory Visit: Payer: Medicare HMO

## 2022-12-18 ENCOUNTER — Ambulatory Visit (HOSPITAL_COMMUNITY)
Admission: RE | Admit: 2022-12-18 | Discharge: 2022-12-18 | Disposition: A | Discharge status: Home / Self Care | Payer: Medicare HMO | Source: Ambulatory Visit | Attending: Vascular Surgery | Admitting: Vascular Surgery

## 2022-12-18 ENCOUNTER — Ambulatory Visit (INDEPENDENT_AMBULATORY_CARE_PROVIDER_SITE_OTHER)
Admission: RE | Admit: 2022-12-18 | Discharge: 2022-12-18 | Disposition: A | Discharge status: Home / Self Care | Payer: Medicare HMO | Source: Ambulatory Visit | Attending: Vascular Surgery | Admitting: Vascular Surgery

## 2022-12-18 ENCOUNTER — Ambulatory Visit: Payer: Medicare HMO | Admitting: Physician Assistant

## 2022-12-18 VITALS — BP 152/92 | HR 69 | Temp 97.6°F | Wt 209.0 lb

## 2022-12-18 DIAGNOSIS — I70222 Atherosclerosis of native arteries of extremities with rest pain, left leg: Secondary | ICD-10-CM | POA: Diagnosis not present

## 2022-12-18 DIAGNOSIS — Z9889 Other specified postprocedural states: Secondary | ICD-10-CM

## 2022-12-18 DIAGNOSIS — I70229 Atherosclerosis of native arteries of extremities with rest pain, unspecified extremity: Secondary | ICD-10-CM | POA: Diagnosis present

## 2022-12-18 DIAGNOSIS — I739 Peripheral vascular disease, unspecified: Secondary | ICD-10-CM | POA: Diagnosis present

## 2022-12-18 DIAGNOSIS — I872 Venous insufficiency (chronic) (peripheral): Secondary | ICD-10-CM | POA: Diagnosis not present

## 2022-12-18 LAB — VAS US ABI WITH/WO TBI
Left ABI: 0.89
Right ABI: 0.84

## 2022-12-18 NOTE — Progress Notes (Signed)
Office Note     CC:  follow up Requesting Provider:  John Giovanni, MD  HPI: Justin Wells is a 72 y.o. (12-01-1950) male who presents for routine follow up of PAD. He has history of left SFA-popliteal stenting and left common femoral endarterectomy with bovine pericardial patch angioplasty by Dr. Randie Heinz on 05/04/21. This was for CLI with rest pain. Post operatively his rest pain resolved. He has had issues with LLE swelling. He was evaluated for venous insufficiency at his last visit and duplex did show chronic venous insufficiency. However he is not a candidate for any ablation procedure. He initially was unable to use compression due to extent of swelling but at time of his last visit in June of 2023 he was amenable to try compression stockings. Today he reports  swelling is somewhat better. He does still have swelling in both legs but has not been bothering him. He is without any pain on ambulation or rest pain. Denies any tissue loss. He said he was having some right hip pain but has some arthritis in his hip and back. He just had steroid and numbing injections in his back last week and has not had any pain since. He is compliant with his Aspirin, Statin, and Plavix.  Past Medical History:  Diagnosis Date   Anxiety    Arthritis    "hands; back; legs" (04/18/2016)   Barrett's esophagus    Cerebrovascular disease    CHF (congestive heart failure)    "said I had this a few years ago; it went away" (04/18/2016)   Chronic back pain    Coronary artery disease    Depression    Gastroesophageal reflux    H/O hiatal hernia    Headache(784.0)    "probably monthly" (01/27/2013)   Hyperlipidemia    Hypertension    Peripheral arterial disease    Seizures    "daily the last 5 days" (04/18/2016)   Stroke 2009  2010   "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, weak right leg and hand; I've had 2 or 3 strokes; can't remember the dates" (01/27/2013)    Past Surgical History:  Procedure Laterality  Date   ABDOMINAL AORTAGRAM N/A 01/26/2013   Procedure: ABDOMINAL Ronny Flurry;  Surgeon: Nada Libman, MD;  Location: [facility] CATH LAB;  Service: Cardiovascular;  Laterality: N/A;   ABDOMINAL AORTOGRAM W/LOWER EXTREMITY Bilateral 05/03/2021   Procedure: ABDOMINAL AORTOGRAM W/LOWER EXTREMITY;  Surgeon: Cephus Shelling, MD;  Location: [facility] INVASIVE CV LAB;  Service: Cardiovascular;  Laterality: Bilateral;   CARDIAC CATHETERIZATION     CHOLECYSTECTOMY N/A 05/08/2016   Procedure: LAPAROSCOPIC CHOLECYSTECTOMY WITH INTRAOPERATIVE CHOLANGIOGRAM;  Surgeon: Manus Rudd, MD;  Location: [facility] OR;  Service: General;  Laterality: N/A;   COLONOSCOPY     CORONARY ANGIOPLASTY WITH STENT PLACEMENT  11/02/2007   "2" (01/27/2013)   DIAGNOSTIC LAPAROSCOPY     "twice after nissen; it kept coming apart " (01/27/2013)   ENDARTERECTOMY FEMORAL Left 05/04/2021   Procedure: COMMON ENDARTERECTOMY FEMORAL WITH LEFT LEG STENT;  Surgeon: Maeola Harman, MD;  [facility] OR;  Service: Vascular;  Laterality: Left;   ESOPHAGOGASTRODUODENOSCOPY     FEMORAL ARTERY STENT Bilateral 01/26/2013   HERNIA REPAIR     INSERTION OF ILIAC STENT Left 05/04/2021   Procedure: INSERTION OF LEFT SUPERFICIAL FEMORAL ARTERY 6 X 80 ELUVIA  STENT;  Surgeon: Maeola Harman, MD;  Location: [facility] OR;  Service: Vascular;  Laterality: Left;   LAPAROSCOPIC GASTRIC BANDING  2010  LAPAROSCOPIC NISSEN FUNDOPLICATION  ?2001   LEFT HEART CATHETERIZATION WITH CORONARY ANGIOGRAM N/A 03/09/2014   Procedure: LEFT HEART CATHETERIZATION WITH CORONARY ANGIOGRAM;  Surgeon: Iran Ouch, MD;  Location: [facility] CATH LAB;  Service: Cardiovascular;  Laterality: N/A;   LOWER EXTREMITY ANGIOGRAM Left 03/02/2013   Procedure: LOWER EXTREMITY ANGIOGRAM;  Surgeon: Nada Libman, MD;  Location: [facility] CATH LAB;  Service: Cardiovascular;  Laterality: Left;   LOWER EXTREMITY ANGIOGRAM Left 05/04/2021   Procedure: LOWER EXTREMITY ANGIOGRAM;  Surgeon: Maeola Harman,  MD;  Location: [facility] OR;  Service: Vascular;  Laterality: Left;   PATCH ANGIOPLASTY  05/04/2021   Procedure: PATCH ANGIOPLASTY OF LEFT FEMORAL ARTERY USING 1X6 [place] PATCH;  Surgeon: Maeola Harman, MD;  Location: [facility] OR;  Service: Vascular;;    Social History   Socioeconomic History   Marital status: Married    Spouse name: sheryl   Number of children: 3   Years of education: 11th   Highest education level: Not on file  Occupational History   Occupation: WELDER    Associate Professor: [employer]  Tobacco Use   Smoking status: Former    Packs/day: 2.00    Years: 35.00    Additional pack years: 0.00    Total pack years: 70.00    Types: Cigarettes    Quit date: 01/29/2000    Years since quitting: 22.9   Smokeless tobacco: Never  Vaping Use   Vaping Use: Never used  Substance and Sexual Activity   Alcohol use: No    Comment: 04/18/2016  "quit drinking in 07/1999; I'm a recovering alcoholic"   Drug use: No   Sexual activity: Not Currently  Other Topics Concern   Not on file  Social History Narrative   Patient lives with wife.   Caffeine Use: greater than 5 servings daily   Patient is right handed.   Patient has 11th grade education.   Social Determinants of Health   Financial Resource Strain: Not on file  Food Insecurity: Not on file  Transportation Needs: Not on file  Physical Activity: Not on file  Stress: Not on file  Social Connections: Not on file  Intimate Partner Violence: Not on file    Family History  Problem Relation Age of Onset   Heart disease Mother        Heart Disease before age 35   Hypertension Mother    Heart attack Mother    Heart attack Father     Current Outpatient Medications  Medication Sig Dispense Refill   acetaminophen (TYLENOL) 500 MG tablet Take 1,000 mg by mouth every 6 (six) hours as needed for moderate pain.     Aspirin-Caffeine (BC FAST PAIN RELIEF PO) Take 1 packet by mouth daily as needed (pain).      atorvastatin (LIPITOR) 80 MG tablet Take 80 mg by mouth at bedtime.     clopidogrel (PLAVIX) 75 MG tablet Take 1 tablet by mouth once daily 90 tablet 3   fluticasone (FLONASE) 50 MCG/ACT nasal spray Place 2 sprays into both nostrils daily.     HYDROmorphone (DILAUDID) 4 MG tablet Take 4 mg by mouth every 4 (four) hours as needed (for breakthrough pain).     isosorbide mononitrate (IMDUR) 60 MG 24 hr tablet Take 60 mg by mouth every morning.     levETIRAcetam (KEPPRA) 750 MG tablet Take 1 tablet (750 mg total) by mouth 2 (two) times daily. Take 1.5 tablets twice daily (Patient taking differently: Take 750  mg by mouth 2 (two) times daily.) 60 tablet 1   metoprolol succinate (TOPROL-XL) 25 MG 24 hr tablet Take 25 mg by mouth daily.     mirtazapine (REMERON) 15 MG tablet Take 15 mg by mouth at bedtime.     morphine (MS CONTIN) 15 MG 12 hr tablet Take 15 mg by mouth 2 (two) times daily.     nitroGLYCERIN (NITROSTAT) 0.4 MG SL tablet Place 0.4 mg under the tongue every 5 (five) minutes as needed for chest pain.     Tetrahydrozoline HCl (VISINE OP) Place 1 drop into both eyes daily as needed (dry eyes).     No current facility-administered medications for this visit.    No Known Allergies   REVIEW OF SYSTEMS:   [X]  denotes positive finding, [ ]  denotes negative finding Cardiac  Comments:  Chest pain or chest pressure:    Shortness of breath upon exertion:    Short of breath when lying flat:    Irregular heart rhythm:        Vascular    Pain in calf, thigh, or hip brought on by ambulation:    Pain in feet at night that wakes you up from your sleep:     Blood clot in your veins:    Leg swelling:         Pulmonary    Oxygen at home:    Productive cough:     Wheezing:         Neurologic    Sudden weakness in arms or legs:     Sudden numbness in arms or legs:     Sudden onset of difficulty speaking or slurred speech:    Temporary loss of vision in one eye:     Problems with dizziness:          Gastrointestinal    Blood in stool:     Vomited blood:         Genitourinary    Burning when urinating:     Blood in urine:        Psychiatric    Major depression:         Hematologic    Bleeding problems:    Problems with blood clotting too easily:        Skin    Rashes or ulcers:        Constitutional    Fever or chills:      PHYSICAL EXAMINATION:  Vitals:   12/18/22 1044  BP: (!) 152/92  Pulse: 69  Temp: 97.6 F (36.4 C)  TempSrc: Temporal  SpO2: 98%  Weight: 209 lb (94.8 kg)    General:  WDWN in NAD; vital signs documented above Gait: Normal HENT: WNL, normocephalic Pulmonary: normal non-labored breathing , without wheezing Cardiac: regular HR Abdomen: soft, NT, no masses Vascular Exam/Pulses: 2+ femoral, 2+ left DP and PT pulses, 2+ right PT Extremities: without ischemic changes, without Gangrene , without cellulitis; without open wounds;  Musculoskeletal: no muscle wasting or atrophy  Neurologic: A&O X 3 Psychiatric:  The pt has Normal affect.   Non-Invasive Vascular Imaging:   +-------+-----------+-----------+------------+------------+  ABI/TBIToday's ABIToday's TBIPrevious ABIPrevious TBI  +-------+-----------+-----------+------------+------------+  Right 0.84       0.59       0.84        0.42          +-------+-----------+-----------+------------+------------+  Left  0.89       0.76       0.90  0.61          +-------+-----------+-----------+------------+------------+   VAS [place] Lower extremity arterial Duplex: Left Stent(s):  +---------------+---+---------------+---------++  Prox to Stent  79                biphasic   +---------------+---+---------------+---------++  Proximal Stent 24850-99% stenosistriphasic  +---------------+---+---------------+---------++  Mid Stent      165               biphasic   +---------------+---+---------------+---------++  Distal Stent   188                biphasic   +---------------+---+---------------+---------++  Distal to Stent62                biphasic   +---------------+---+---------------+---------++   Summary:  Left: Patent stent with approximately 50 - 99% stenosis proximal stent area.    ASSESSMENT/PLAN:: 72 y.o. male here for follow up for PAD. He has history of left SFA-popliteal stenting and left common femoral endarterectomy with bovine pericardial patch angioplasty by Dr. Randie Heinz on 05/04/21. This was for CLI with rest pain. Post operatively his rest pain resolved. He remains without any rest pain, claudication or tissue loss. He does continue to have BLE swelling. He is elevating and using compression intermittently. He is not having any real symptoms associated with the swelling. He has chronic venous insufficiency. Duplex today shows stable ABI bilaterally. The SFA stent has some elevated velocities however this has been stable and unchanged from prior studies. He has known right SFA occlusion in area of previously placed stent. He is not indicated for any intervention on either leg at this time - Encourage elevation and compression PRN - Encourage exercise/ walking regimen - He will continue Aspirin, statin, Plavix - He can follow up in 1 year with LLE arterial duplex and ABI - He knows to follow up sooner if he should develop any new or concerning symptoms  Graceann Congress, PA-C [facility] [phone]  Clinic MD: Randie Heinz

## 2023-03-20 ENCOUNTER — Telehealth: Payer: Self-pay

## 2023-03-20 NOTE — Telephone Encounter (Signed)
Caller: Patient's wife, Tora Duck  Concern: Leg & foot swelling, purple/blue hole on top of foot, painful near the area, no drainage, some cramping, states that it appears like an insect bite or injury  Location: left leg  Description:  x 1 wk  Resolution: Appointment scheduled per pt request and Instructed patient to contact primary care MD to r/o bite or injury  Next Appt: Appointment scheduled for 04/16/23 for PA. Will eval to see if adding an [place] is appropriate.

## 2023-04-14 NOTE — Progress Notes (Unsigned)
HISTORY AND PHYSICAL     CC:  follow up. Requesting Provider:  John Giovanni, MD  HPI: This is a 72 y.o. male who is here today for follow up for PAD.  Pt has hx of left SFA-popliteal stenting and left common femoral endarterectomy with bovine pericardial patch angioplasty by Dr. Randie Heinz on 05/04/21. This was for CLI with rest pain. Post operatively his rest pain resolved. He has had issues with LLE swelling. He was evaluated for venous insufficiency and duplex did show chronic venous insufficiency. However he was not a candidate for any ablation procedure.    Pt was last seen 12/18/2022 and at that time, he was still having some swelling but was somewhat better than previously.  He was not having any rest pain or claudication. He was having some right hip pain and had recently had an ESI. His SFA stent did have some elevated velocities but was stable and unchanged from previous studies.  He has a known right SFA occlusion in area of previously placed stent.    The pt returns today for follow up.  ***  The pt is on a statin for cholesterol management.    The pt *** on an aspirin.    Other AC:  Plavix The pt is on BB for hypertension.  The pt is not on medication for diabetes. Tobacco hx:  former  Pt does *** have family hx of AAA.  Past Medical History:  Diagnosis Date   Anxiety    Arthritis    "hands; back; legs" (04/18/2016)   Barrett's esophagus    Cerebrovascular disease    CHF (congestive heart failure) (HCC)    "said I had this a few years ago; it went away" (04/18/2016)   Chronic back pain    Coronary artery disease    Depression    Gastroesophageal reflux    H/O hiatal hernia    Headache(784.0)    "probably monthly" (01/27/2013)   Hyperlipidemia    Hypertension    Peripheral arterial disease (HCC)    Seizures (HCC)    "daily the last 5 days" (04/18/2016)   Stroke [facility]) 2009  2010   "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, weak right leg and hand; I've had 2 or 3 strokes;  can't remember the dates" (01/27/2013)    Past Surgical History:  Procedure Laterality Date   ABDOMINAL AORTAGRAM N/A 01/26/2013   Procedure: ABDOMINAL Ronny Flurry;  Surgeon: Nada Libman, MD;  Location: [facility] CATH LAB;  Service: Cardiovascular;  Laterality: N/A;   ABDOMINAL AORTOGRAM W/LOWER EXTREMITY Bilateral 05/03/2021   Procedure: ABDOMINAL AORTOGRAM W/LOWER EXTREMITY;  Surgeon: Cephus Shelling, MD;  [facility]: [facility] INVASIVE CV LAB;  Service: Cardiovascular;  Laterality: Bilateral;   CARDIAC CATHETERIZATION     CHOLECYSTECTOMY N/A 05/08/2016   Procedure: LAPAROSCOPIC CHOLECYSTECTOMY WITH INTRAOPERATIVE CHOLANGIOGRAM;  Surgeon: Manus Rudd, MD;  Location: [facility] OR;  Service: General;  Laterality: N/A;   COLONOSCOPY     CORONARY ANGIOPLASTY WITH STENT PLACEMENT  11/02/2007   "2" (01/27/2013)   DIAGNOSTIC LAPAROSCOPY     "twice after nissen; it kept coming apart " (01/27/2013)   ENDARTERECTOMY FEMORAL Left 05/04/2021   Procedure: COMMON ENDARTERECTOMY FEMORAL WITH LEFT LEG STENT;  Surgeon: Maeola Harman, MD;  Location: [facility] OR;  Service: Vascular;  Laterality: Left;   ESOPHAGOGASTRODUODENOSCOPY     FEMORAL ARTERY STENT Bilateral 01/26/2013   HERNIA REPAIR     INSERTION OF ILIAC STENT Left 05/04/2021   Procedure: INSERTION OF LEFT SUPERFICIAL  FEMORAL ARTERY 6 X 80 ELUVIA  STENT;  Surgeon: Maeola Harman, MD;  Location: [facility] OR;  Service: Vascular;  Laterality: Left;   LAPAROSCOPIC GASTRIC BANDING  2010   LAPAROSCOPIC NISSEN FUNDOPLICATION  ?2001   LEFT HEART CATHETERIZATION WITH CORONARY ANGIOGRAM N/A 03/09/2014   Procedure: LEFT HEART CATHETERIZATION WITH CORONARY ANGIOGRAM;  Surgeon: Iran Ouch, MD;  [facility] CATH LAB;  Service: Cardiovascular;  Laterality: N/A;   LOWER EXTREMITY ANGIOGRAM Left 03/02/2013   Procedure: LOWER EXTREMITY ANGIOGRAM;  Surgeon: Nada Libman, MD;  Location: [facility] CATH LAB;  Service: Cardiovascular;  Laterality: Left;   LOWER EXTREMITY ANGIOGRAM  Left 05/04/2021   Procedure: LOWER EXTREMITY ANGIOGRAM;  Surgeon: Maeola Harman, MD;  Location: [facility] OR;  Service: Vascular;  Laterality: Left;   PATCH ANGIOPLASTY  05/04/2021   Procedure: PATCH ANGIOPLASTY OF LEFT FEMORAL ARTERY USING 1X6 XENOSURE BOVINE PATCH;  Surgeon: Maeola Harman, MD;  Location: [facility] OR;  Service: Vascular;;    No Known Allergies  Current Outpatient Medications  Medication Sig Dispense Refill   acetaminophen (TYLENOL) 500 MG tablet Take 1,000 mg by mouth every 6 (six) hours as needed for moderate pain.     Aspirin-Caffeine (BC FAST PAIN RELIEF PO) Take 1 packet by mouth daily as needed (pain).     atorvastatin (LIPITOR) 80 MG tablet Take 80 mg by mouth at bedtime.     clopidogrel (PLAVIX) 75 MG tablet Take 1 tablet by mouth once daily 90 tablet 3   fluticasone (FLONASE) 50 MCG/ACT nasal spray Place 2 sprays into both nostrils daily.     HYDROmorphone (DILAUDID) 4 MG tablet Take 4 mg by mouth every 4 (four) hours as needed (for breakthrough pain).     isosorbide mononitrate (IMDUR) 60 MG 24 hr tablet Take 60 mg by mouth every morning.     levETIRAcetam (KEPPRA) 750 MG tablet Take 1 tablet (750 mg total) by mouth 2 (two) times daily. Take 1.5 tablets twice daily (Patient taking differently: Take 750 mg by mouth 2 (two) times daily.) 60 tablet 1   metoprolol succinate (TOPROL-XL) 25 MG 24 hr tablet Take 25 mg by mouth daily.     mirtazapine (REMERON) 15 MG tablet Take 15 mg by mouth at bedtime.     morphine (MS CONTIN) 15 MG 12 hr tablet Take 15 mg by mouth 2 (two) times daily.     nitroGLYCERIN (NITROSTAT) 0.4 MG SL tablet Place 0.4 mg under the tongue every 5 (five) minutes as needed for chest pain.     Tetrahydrozoline HCl (VISINE OP) Place 1 drop into both eyes daily as needed (dry eyes).     No current facility-administered medications for this visit.    Family History  Problem Relation Age of Onset   Heart disease Mother        Heart Disease  before age 60   Hypertension Mother    Heart attack Mother    Heart attack Father     Social History   Socioeconomic History   Marital status: Married    Spouse name: sheryl   Number of children: 3   Years of education: 11th   Highest education level: Not on file  Occupational History   Occupation: WELDER    Associate Professor: [employer]  Tobacco Use   Smoking status: Former    Current packs/day: 0.00    Average packs/day: 2.0 packs/day for 35.0 years (70.0 ttl pk-yrs)    Types: Cigarettes  Start date: 01/28/1965    Quit date: 01/29/2000    Years since quitting: 23.2   Smokeless tobacco: Never  Vaping Use   Vaping status: Never Used  Substance and Sexual Activity   Alcohol use: No    Comment: 04/18/2016  "quit drinking in 07/1999; I'm a recovering alcoholic"   Drug use: No   Sexual activity: Not Currently  Other Topics Concern   Not on file  Social History Narrative   Patient lives with wife.   Caffeine Use: greater than 5 servings daily   Patient is right handed.   Patient has 11th grade education.   Social Determinants of Health   Financial Resource Strain: Low Risk  (04/05/2020)   Received from [facility], [facility]   Overall Financial Resource Strain (CARDIA)    Difficulty of Paying Living Expenses: Not hard at all  Food Insecurity: No Food Insecurity (04/05/2020)   Received from [facility], [facility]   Hunger Vital Sign    Worried About Running Out of Food in the Last Year: Never true    Ran Out of Food in the Last Year: Never true  Transportation Needs: No Transportation Needs (03/12/2023)   Received from [facility] - Transportation    Lack of Transportation (Medical): No    Lack of Transportation (Non-Medical): No  Physical Activity: Inactive (04/05/2020)   Received from [facility], [facility]   Exercise Vital Sign    Days of Exercise per Week: 0 days    Minutes of Exercise per Session: 0 min  Stress: No  Stress Concern Present (04/05/2020)   Received from [facility], [facility] - Occupational Stress Questionnaire    Feeling of Stress : Only a little  Social Connections: Socially Integrated (04/05/2020)   Received from [facility], [facility]   Social Connection and Isolation Panel [NHANES]    Frequency of Communication with Friends and Family: Twice a week    Frequency of Social Gatherings with Friends and Family: Three times a week    Attends Religious Services: More than 4 times per year    Active Member of Clubs or Organizations: Yes    Attends Banker Meetings: More than 4 times per year    Marital Status: Married  Catering manager Violence: Not At Risk (04/05/2020)   Received from [facility], [facility]   Humiliation, Afraid, Rape, and Kick questionnaire    Fear of Current or Ex-Partner: No    Emotionally Abused: No    Physically Abused: No    Sexually Abused: No     REVIEW OF SYSTEMS:  *** [X]  denotes positive finding, [ ]  denotes negative finding Cardiac  Comments:  Chest pain or chest pressure:    Shortness of breath upon exertion:    Short of breath when lying flat:    Irregular heart rhythm:        Vascular    Pain in calf, thigh, or hip brought on by ambulation:    Pain in feet at night that wakes you up from your sleep:     Blood clot in your veins:    Leg swelling:         Pulmonary    Oxygen at home:    Productive cough:     Wheezing:         Neurologic    Sudden weakness  in arms or legs:     Sudden numbness in arms or legs:     Sudden onset of difficulty speaking or slurred speech:    Temporary loss of vision in one eye:     Problems with dizziness:         Gastrointestinal    Blood in stool:     Vomited blood:         Genitourinary    Burning when urinating:     Blood in urine:        Psychiatric    Major depression:         Hematologic    Bleeding problems:     Problems with blood clotting too easily:        Skin    Rashes or ulcers:        Constitutional    Fever or chills:      PHYSICAL EXAMINATION:  ***  General:  WDWN in NAD; vital signs documented above Gait: Not observed HENT: WNL, normocephalic Pulmonary: normal non-labored breathing , without wheezing Cardiac: {Desc; regular/irreg:14544} HR, {With/Without:20273} carotid bruit*** Abdomen: soft, NT; aortic pulse is *** palpable Skin: {With/Without:20273} rashes Vascular Exam/Pulses:  Right Left  Radial {Exam; arterial pulse strength 0-4:30167} {Exam; arterial pulse strength 0-4:30167}  Femoral {Exam; arterial pulse strength 0-4:30167} {Exam; arterial pulse strength 0-4:30167}  Popliteal {Exam; arterial pulse strength 0-4:30167} {Exam; arterial pulse strength 0-4:30167}  DP {Exam; arterial pulse strength 0-4:30167} {Exam; arterial pulse strength 0-4:30167}  PT {Exam; arterial pulse strength 0-4:30167} {Exam; arterial pulse strength 0-4:30167}  Peroneal *** ***   Extremities: {With/Without:20273} ischemic changes, {With/Without:20273} Gangrene , {With/Without:20273} cellulitis; {With/Without:20273} open wounds Musculoskeletal: no muscle wasting or atrophy  Neurologic: A&O X 3 Psychiatric:  The pt has {Desc; normal/abnormal:11317::"Normal"} affect.   Non-Invasive Vascular Imaging:    Previous ABI's/TBI's on 12/18/2022: Right:  0.84/0.59 - Great toe pressure: 82 Left:  0.89/0.76 - Great toe pressure:  107  Previous arterial duplex on 12/18/2022: +-----------+--------+-----+--------+--------+--------+  LEFT      PSV cm/sRatioStenosisWaveformComments  +-----------+--------+-----+--------+--------+--------+  CFA Prox   79                   biphasic          +-----------+--------+-----+--------+--------+--------+  POP Prox   48                   biphasic          +-----------+--------+-----+--------+--------+--------+  POP Mid    40                    biphasic          +-----------+--------+-----+--------+--------+--------+  POP Distal 29                   biphasicdampened  +-----------+--------+-----+--------+--------+--------+  TP Trunk   71                   biphasic          +-----------+--------+-----+--------+--------+--------+  ATA Distal 67                   biphasic          +-----------+--------+-----+--------+--------+--------+  PTA Distal 35                   biphasic          +-----------+--------+-----+--------+--------+--------+  PERO Distal  nv        +-----------+--------+-----+--------+--------+--------+     Left Stent(s):  +---------------+---+---------------+---------++  Prox to Stent  79                biphasic   +---------------+---+---------------+---------++  Proximal Stent 24850-99% stenosistriphasic  +---------------+---+---------------+---------++  Mid Stent      165               biphasic   +---------------+---+---------------+---------++  Distal Stent   188               biphasic   +---------------+---+---------------+---------++  Distal to Stent62                biphasic   +---------------+---+---------------+---------++     ASSESSMENT/PLAN:: 72 y.o. male here for follow up for PAD with hx of left SFA-popliteal stenting and left common femoral endarterectomy with bovine pericardial patch angioplasty by Dr. Randie Heinz on 05/04/21. This was for CLI with rest pain. Post operatively his rest pain resolved. He has had issues with LLE swelling. He was evaluated for venous insufficiency and duplex did show chronic venous insufficiency.    -*** -continue *** -pt will f/u in *** with ***.   Doreatha Massed, [facility] [phone]  Clinic MD:   Randie Heinz

## 2023-04-16 ENCOUNTER — Other Ambulatory Visit: Payer: Self-pay | Admitting: Physician Assistant

## 2023-04-16 ENCOUNTER — Ambulatory Visit: Payer: Medicare HMO | Admitting: Physician Assistant

## 2023-04-16 ENCOUNTER — Other Ambulatory Visit: Payer: Self-pay

## 2023-04-16 ENCOUNTER — Encounter: Payer: Self-pay | Admitting: Physician Assistant

## 2023-04-16 VITALS — BP 136/74 | HR 58 | Temp 98.0°F | Resp 18 | Ht 70.0 in | Wt 206.3 lb

## 2023-04-16 DIAGNOSIS — I70211 Atherosclerosis of native arteries of extremities with intermittent claudication, right leg: Secondary | ICD-10-CM | POA: Diagnosis not present

## 2023-04-16 DIAGNOSIS — L03116 Cellulitis of left lower limb: Secondary | ICD-10-CM

## 2023-04-16 MED ORDER — CEPHALEXIN 500 MG PO CAPS: 500.0000 mg | ORAL_CAPSULE | Freq: Two times a day (BID) | ORAL | 0 refills | Status: DC

## 2023-04-29 ENCOUNTER — Ambulatory Visit
Admission: RE | Admit: 2023-04-29 | Discharge: 2023-04-29 | Disposition: A | Discharge status: Home / Self Care | Payer: Medicare HMO | Source: Ambulatory Visit | Attending: Vascular Surgery | Admitting: Vascular Surgery

## 2023-04-29 DIAGNOSIS — L03116 Cellulitis of left lower limb: Secondary | ICD-10-CM

## 2023-04-29 DIAGNOSIS — I70211 Atherosclerosis of native arteries of extremities with intermittent claudication, right leg: Secondary | ICD-10-CM

## 2023-04-29 MED ORDER — IOPAMIDOL (ISOVUE-370) INJECTION 76%
500.0000 mL | Freq: Once | INTRAVENOUS | Status: AC | PRN
  Administered 2023-04-29: 100 mL via INTRAVENOUS

## 2023-04-30 ENCOUNTER — Ambulatory Visit: Payer: Medicare HMO | Admitting: Vascular Surgery

## 2023-05-07 ENCOUNTER — Encounter: Payer: Self-pay | Admitting: Vascular Surgery

## 2023-05-07 ENCOUNTER — Ambulatory Visit: Payer: Medicare HMO | Admitting: Vascular Surgery

## 2023-05-07 VITALS — BP 126/66 | HR 67 | Temp 98.1°F | Resp 20 | Ht 70.0 in | Wt 204.0 lb

## 2023-05-07 DIAGNOSIS — I739 Peripheral vascular disease, unspecified: Secondary | ICD-10-CM

## 2023-05-07 NOTE — Progress Notes (Signed)
Patient ID: Justin Wells, male   DOB: [birth year], 72 y.o.   MRN: [id number]  Reason for Consult: Follow-up   Referred by John Giovanni, MD  Subjective:     HPI:  Justin Wells is a 72 y.o. male with remote history of right lower extremity stenting.  He also has left common femoral endarterectomy with stenting of the left SFA by me just 2 years ago.  He states that he does have some swelling of the left leg but mostly doing well.  On the right side he recently ran his shin into the bedside and he has a healing wound.  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  He states that he had right hip and back pain he is followed by Dr. Lovell Sheehan with neurosurgery.  He denies any tissue loss no ulceration other than his recent injury.  He denies any frank claudication.  He is a former smoker quit many years ago.  Currently he takes aspirin and a statin.  Past Medical History:  Diagnosis Date   Anxiety    Arthritis    "hands; back; legs" (04/18/2016)   Barrett's esophagus    Cerebrovascular disease    CHF (congestive heart failure) (HCC)    "said I had this a few years ago; it went away" (04/18/2016)   Chronic back pain    Coronary artery disease    Depression    Gastroesophageal reflux    H/O hiatal hernia    Headache(784.0)    "probably monthly" (01/27/2013)   Hyperlipidemia    Hypertension    Peripheral arterial disease (HCC)    Seizures (HCC)    "daily the last 5 days" (04/18/2016)   Stroke [facility]) 2009  2010   "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, weak right leg and hand; I've had 2 or 3 strokes; can't remember the dates" (01/27/2013)   Family History  Problem Relation Age of Onset   Heart disease Mother        Heart Disease before age 80   Hypertension Mother    Heart attack Mother    Heart attack Father    Past Surgical History:  Procedure Laterality Date   ABDOMINAL AORTAGRAM N/A 01/26/2013   Procedure: ABDOMINAL  Ronny Flurry;  Surgeon: Nada Libman, MD;  Location: [facility] CATH LAB;  Service: Cardiovascular;  Laterality: N/A;   ABDOMINAL AORTOGRAM W/LOWER EXTREMITY Bilateral 05/03/2021   Procedure: ABDOMINAL AORTOGRAM W/LOWER EXTREMITY;  Surgeon: Cephus Shelling, MD;  [facility]: [facility] INVASIVE CV LAB;  Service: Cardiovascular;  Laterality: Bilateral;   CARDIAC CATHETERIZATION     CHOLECYSTECTOMY N/A 05/08/2016   Procedure: LAPAROSCOPIC CHOLECYSTECTOMY WITH INTRAOPERATIVE CHOLANGIOGRAM;  Surgeon: Manus Rudd, MD;  Location: [facility] OR;  Service: General;  Laterality: N/A;   COLONOSCOPY     CORONARY ANGIOPLASTY WITH STENT PLACEMENT  11/02/2007   "2" (01/27/2013)   DIAGNOSTIC LAPAROSCOPY     "twice after nissen; it kept coming apart " (01/27/2013)   ENDARTERECTOMY FEMORAL Left 05/04/2021   Procedure: COMMON ENDARTERECTOMY FEMORAL WITH LEFT LEG STENT;  Surgeon: Maeola Harman, MD;  Location: [facility] OR;  Service: Vascular;  Laterality: Left;   ESOPHAGOGASTRODUODENOSCOPY     FEMORAL ARTERY STENT Bilateral 01/26/2013   HERNIA REPAIR     INSERTION OF ILIAC STENT Left 05/04/2021   Procedure: INSERTION OF LEFT SUPERFICIAL FEMORAL ARTERY 6 X 80 ELUVIA  STENT;  Surgeon: Maeola Harman, MD;  Location: [facility] OR;  Service:  Vascular;  Laterality: Left;   LAPAROSCOPIC GASTRIC BANDING  2010   LAPAROSCOPIC NISSEN FUNDOPLICATION  ?2001   LEFT HEART CATHETERIZATION WITH CORONARY ANGIOGRAM N/A 03/09/2014   Procedure: LEFT HEART CATHETERIZATION WITH CORONARY ANGIOGRAM;  Surgeon: Iran Ouch, MD;  [facility]: [facility] CATH LAB;  Service: Cardiovascular;  Laterality: N/A;   LOWER EXTREMITY ANGIOGRAM Left 03/02/2013   Procedure: LOWER EXTREMITY ANGIOGRAM;  Surgeon: Nada Libman, MD;  Location: [facility] CATH LAB;  Service: Cardiovascular;  Laterality: Left;   LOWER EXTREMITY ANGIOGRAM Left 05/04/2021   Procedure: LOWER EXTREMITY ANGIOGRAM;  Surgeon: Maeola Harman, MD;  Location: [facility] OR;  Service: Vascular;  Laterality: Left;    PATCH ANGIOPLASTY  05/04/2021   Procedure: PATCH ANGIOPLASTY OF LEFT FEMORAL ARTERY USING 1X6 XENOSURE BOVINE PATCH;  Surgeon: Maeola Harman, MD;  Location: [facility] OR;  Service: Vascular;;    Short Social History:  Social History   Tobacco Use   Smoking status: Former    Current packs/day: 0.00    Average packs/day: 2.0 packs/day for 35.0 years (70.0 ttl pk-yrs)    Types: Cigarettes    Start date: 01/28/1965    Quit date: 01/29/2000    Years since quitting: 23.2   Smokeless tobacco: Never  Substance Use Topics   Alcohol use: No    Comment: 04/18/2016  "quit drinking in 07/1999; I'm a recovering alcoholic"    No Known Allergies  Current Outpatient Medications  Medication Sig Dispense Refill   acetaminophen (TYLENOL) 500 MG tablet Take 1,000 mg by mouth every 6 (six) hours as needed for moderate pain.     Aspirin-Caffeine (BC FAST PAIN RELIEF PO) Take 1 packet by mouth daily as needed (pain).     atorvastatin (LIPITOR) 80 MG tablet Take 80 mg by mouth at bedtime.     cephALEXin (KEFLEX) 500 MG capsule Take 1 capsule (500 mg total) by mouth 2 (two) times daily. 6 capsule 0   clopidogrel (PLAVIX) 75 MG tablet Take 1 tablet by mouth once daily 90 tablet 3   fluticasone (FLONASE) 50 MCG/ACT nasal spray Place 2 sprays into both nostrils daily.     HYDROmorphone (DILAUDID) 4 MG tablet Take 4 mg by mouth every 4 (four) hours as needed (for breakthrough pain).     isosorbide mononitrate (IMDUR) 60 MG 24 hr tablet Take 60 mg by mouth every morning.     levETIRAcetam (KEPPRA) 750 MG tablet Take 1 tablet (750 mg total) by mouth 2 (two) times daily. Take 1.5 tablets twice daily (Patient taking differently: Take 750 mg by mouth 2 (two) times daily.) 60 tablet 1   metoprolol succinate (TOPROL-XL) 25 MG 24 hr tablet Take 25 mg by mouth daily.     mirtazapine (REMERON) 15 MG tablet Take 15 mg by mouth at bedtime.     morphine (MS CONTIN) 15 MG 12 hr tablet Take 15 mg by mouth 2 (two) times  daily.     nitroGLYCERIN (NITROSTAT) 0.4 MG SL tablet Place 0.4 mg under the tongue every 5 (five) minutes as needed for chest pain.     Tetrahydrozoline HCl (VISINE OP) Place 1 drop into both eyes daily as needed (dry eyes).     No current facility-administered medications for this visit.    Review of Systems  Constitutional:  Constitutional negative. HENT: HENT negative.  Eyes: Eyes negative.  Respiratory: Respiratory negative.  Cardiovascular: Positive for leg swelling.  Musculoskeletal: Positive for back pain, gait problem and leg pain.  Hematologic: Hematologic/lymphatic negative.  Psychiatric:  Psychiatric negative.        Objective:  Objective   Vitals:   05/07/23 0901  BP: 126/66  Pulse: 67  Resp: 20  Temp: 98.1 F (36.7 C)  SpO2: 95%  Weight: 204 lb (92.5 kg)  Height: 5\' 10"  (1.778 m)   Body mass index is 29.27 kg/m.  Physical Exam HENT:     Head: Normocephalic.     Nose: Nose normal.  Eyes:     Pupils: Pupils are equal, round, and reactive to light.  Cardiovascular:     Rate and Rhythm: Normal rate.     Pulses:          Femoral pulses are 2+ on the right side and 2+ on the left side.      Popliteal pulses are 0 on the right side and 0 on the left side.  Pulmonary:     Effort: Pulmonary effort is normal.  Abdominal:     General: Abdomen is flat.  Musculoskeletal:     Right lower leg: No edema.     Left lower leg: No edema.     Comments: He has a small bandaged abrasion of the right anterior leg  Skin:    Capillary Refill: Capillary refill takes less than 2 seconds.  Neurological:     General: No focal deficit present.     Mental Status: He is alert.  Psychiatric:        Mood and Affect: Mood normal.        Thought Content: Thought content normal.     Data: CTA IMPRESSION: Multilevel PA D, including:   -mild aortic atherosclerosis Aortic Atherosclerosis (ICD10-I70.0).   -mild bilateral CIA disease without high-grade stenosis  or occlusion. Right greater than left disease of the hypogastric arteries.   -advanced right femoropopliteal disease, including proximal SFA occlusion just beyond the origin, reconstitution of the stented segment, and additional short segment stenosis within the adductor canal.   -postsurgical changes of left CFA endarterectomy with patent stent system of the SFA and evidence of mild in stent stenosis.   -no significant right-sided tibial arterial disease   -no significant left-sided tibial arterial disease, with either a hypoplastic distal anterior tibial artery or distal occlusion.   No significant mesenteric or renal arterial disease.     Assessment/Plan:    72 year old male with history as above bilateral SFA stenting and left common femoral endarterectomy.  Right SFA stents appear occluded he has no palpable popliteal pulse also has right common femoral disease but with a weekend blood present common femoral pulse on the right.  He has right hip and buttock pain this is likely multifactorial given his underlying back disease for which she has undergone injections and is followed by Dr. Lovell Sheehan.  His last noninvasive studies were in April of this year I will have him follow-up in 3 months to repeat the studies and we can discuss any need for intervention which would likely be common femoral endarterectomy and bypass if necessary.     Maeola Harman MD [facility]

## 2023-05-20 ENCOUNTER — Other Ambulatory Visit: Payer: Self-pay

## 2023-05-20 DIAGNOSIS — I739 Peripheral vascular disease, unspecified: Secondary | ICD-10-CM

## 2023-07-30 ENCOUNTER — Ambulatory Visit: Payer: Medicare HMO | Admitting: Vascular Surgery

## 2023-07-30 ENCOUNTER — Encounter: Payer: Self-pay | Admitting: Vascular Surgery

## 2023-07-30 ENCOUNTER — Ambulatory Visit (HOSPITAL_COMMUNITY)
Admission: RE | Admit: 2023-07-30 | Discharge: 2023-07-30 | Disposition: A | Discharge status: Home / Self Care | Payer: Medicare HMO | Source: Ambulatory Visit | Attending: Vascular Surgery | Admitting: Vascular Surgery

## 2023-07-30 ENCOUNTER — Ambulatory Visit (INDEPENDENT_AMBULATORY_CARE_PROVIDER_SITE_OTHER)
Admission: RE | Admit: 2023-07-30 | Discharge: 2023-07-30 | Disposition: A | Discharge status: Home / Self Care | Payer: Medicare HMO | Source: Ambulatory Visit | Attending: Vascular Surgery | Admitting: Vascular Surgery

## 2023-07-30 VITALS — BP 129/70 | HR 57 | Temp 97.9°F | Ht 70.0 in | Wt 204.6 lb

## 2023-07-30 DIAGNOSIS — I739 Peripheral vascular disease, unspecified: Secondary | ICD-10-CM | POA: Diagnosis present

## 2023-07-30 DIAGNOSIS — I70221 Atherosclerosis of native arteries of extremities with rest pain, right leg: Secondary | ICD-10-CM

## 2023-07-30 LAB — VAS US ABI WITH/WO TBI
Left ABI: 0.86
Right ABI: 0.74

## 2023-07-30 NOTE — H&P (View-Only) (Signed)
 Patient ID: Justin Wells, male   DOB: 06/06/1951, 72 y.o.   MRN: [id number]  Reason for Consult: Follow-up and PAD   Referred by John Giovanni, MD  Subjective:     HPI:  Justin Wells is a 72 y.o. male has a remote history of right lower extremity stenting and more recent left common femoral endarterectomy with stenting of the SFA a couple years ago.  He states that his right foot is now causing him significant pain even at night forcing him to wake up.  He also has associated very short distance claudication states that he really cannot walk to the parking lot without his right calf hurting and forcing him to stop.  He is a former smoker quit many years ago and takes aspirin and a statin.  He is accompanied by his pastor today.  Past Medical History:  Diagnosis Date   Anxiety    Arthritis    "hands; back; legs" (04/18/2016)   Barrett's esophagus    Cerebrovascular disease    CHF (congestive heart failure) (HCC)    "said I had this a few years ago; it went away" (04/18/2016)   Chronic back pain    Coronary artery disease    Depression    Gastroesophageal reflux    H/O hiatal hernia    Headache(784.0)    "probably monthly" (01/27/2013)   Hyperlipidemia    Hypertension    Peripheral arterial disease (HCC)    Seizures (HCC)    "daily the last 5 days" (04/18/2016)   Stroke [facility]) 2009  2010   "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, weak right leg and hand; I've had 2 or 3 strokes; can't remember the dates" (01/27/2013)   Family History  Problem Relation Age of Onset   Heart disease Mother        Heart Disease before age 63   Hypertension Mother    Heart attack Mother    Heart attack Father    Past Surgical History:  Procedure Laterality Date   ABDOMINAL AORTAGRAM N/A 01/26/2013   Procedure: ABDOMINAL Ronny Flurry;  Surgeon: Nada Libman, MD;  Location: [facility] CATH LAB;  Service: Cardiovascular;  Laterality: N/A;   ABDOMINAL AORTOGRAM W/LOWER EXTREMITY Bilateral 05/03/2021    Procedure: ABDOMINAL AORTOGRAM W/LOWER EXTREMITY;  Surgeon: Cephus Shelling, MD;  Location: [facility] INVASIVE CV LAB;  Service: Cardiovascular;  Laterality: Bilateral;   CARDIAC CATHETERIZATION     CHOLECYSTECTOMY N/A 05/08/2016   Procedure: LAPAROSCOPIC CHOLECYSTECTOMY WITH INTRAOPERATIVE CHOLANGIOGRAM;  Surgeon: Manus Rudd, MD;  Location: [facility] OR;  Service: General;  Laterality: N/A;   COLONOSCOPY     CORONARY ANGIOPLASTY WITH STENT PLACEMENT  11/02/2007   "2" (01/27/2013)   DIAGNOSTIC LAPAROSCOPY     "twice after nissen; it kept coming apart " (01/27/2013)   ENDARTERECTOMY FEMORAL Left 05/04/2021   Procedure: COMMON ENDARTERECTOMY FEMORAL WITH LEFT LEG STENT;  Surgeon: Maeola Harman, MD;  Location: [facility] OR;  Service: Vascular;  Laterality: Left;   ESOPHAGOGASTRODUODENOSCOPY     FEMORAL ARTERY STENT Bilateral 01/26/2013   HERNIA REPAIR     INSERTION OF ILIAC STENT Left 05/04/2021   Procedure: INSERTION OF LEFT SUPERFICIAL FEMORAL ARTERY 6 X 80 ELUVIA  STENT;  Surgeon: Maeola Harman, MD;  Location: [facility] OR;  Service: Vascular;  Laterality: Left;   LAPAROSCOPIC GASTRIC BANDING  2010   LAPAROSCOPIC NISSEN FUNDOPLICATION  ?2001   LEFT HEART CATHETERIZATION WITH CORONARY ANGIOGRAM N/A 03/09/2014   Procedure: LEFT HEART CATHETERIZATION WITH  CORONARY ANGIOGRAM;  Surgeon: Iran Ouch, MD;  [facility];  Service: Cardiovascular;  Laterality: N/A;   LOWER EXTREMITY ANGIOGRAM Left 03/02/2013   Procedure: LOWER EXTREMITY ANGIOGRAM;  Surgeon: Nada Libman, MD;  Location: [facility] CATH LAB;  Service: Cardiovascular;  Laterality: Left;   LOWER EXTREMITY ANGIOGRAM Left 05/04/2021   Procedure: LOWER EXTREMITY ANGIOGRAM;  Surgeon: Maeola Harman, MD;  Location: [facility] OR;  Service: Vascular;  Laterality: Left;   PATCH ANGIOPLASTY  05/04/2021   Procedure: PATCH ANGIOPLASTY OF LEFT FEMORAL ARTERY USING 1X6 XENOSURE BOVINE PATCH;  Surgeon: Maeola Harman, MD;  Location: [facility] OR;   Service: Vascular;;    Short Social History:  Social History   Tobacco Use   Smoking status: Former    Current packs/day: 0.00    Average packs/day: 2.0 packs/day for 35.0 years (70.0 ttl pk-yrs)    Types: Cigarettes    Start date: 01/28/1965    Quit date: 01/29/2000    Years since quitting: 23.5   Smokeless tobacco: Never  Substance Use Topics   Alcohol use: No    Comment: 04/18/2016  "quit drinking in 07/1999; I'm a recovering alcoholic"    No Known Allergies  Current Outpatient Medications  Medication Sig Dispense Refill   acetaminophen (TYLENOL) 500 MG tablet Take 1,000 mg by mouth every 6 (six) hours as needed for moderate pain.     Aspirin-Caffeine (BC FAST PAIN RELIEF PO) Take 1 packet by mouth daily as needed (pain).     atorvastatin (LIPITOR) 80 MG tablet Take 80 mg by mouth at bedtime.     cephALEXin (KEFLEX) 500 MG capsule Take 1 capsule (500 mg total) by mouth 2 (two) times daily. 6 capsule 0   clopidogrel (PLAVIX) 75 MG tablet Take 1 tablet by mouth once daily 90 tablet 3   fluticasone (FLONASE) 50 MCG/ACT nasal spray Place 2 sprays into both nostrils daily.     HYDROmorphone (DILAUDID) 4 MG tablet Take 4 mg by mouth every 4 (four) hours as needed (for breakthrough pain).     isosorbide mononitrate (IMDUR) 60 MG 24 hr tablet Take 60 mg by mouth every morning.     levETIRAcetam (KEPPRA) 750 MG tablet Take 1 tablet (750 mg total) by mouth 2 (two) times daily. Take 1.5 tablets twice daily (Patient taking differently: Take 750 mg by mouth 2 (two) times daily.) 60 tablet 1   metoprolol succinate (TOPROL-XL) 25 MG 24 hr tablet Take 25 mg by mouth daily.     mirtazapine (REMERON) 15 MG tablet Take 15 mg by mouth at bedtime.     morphine (MS CONTIN) 15 MG 12 hr tablet Take 15 mg by mouth 2 (two) times daily.     nitroGLYCERIN (NITROSTAT) 0.4 MG SL tablet Place 0.4 mg under the tongue every 5 (five) minutes as needed for chest pain.     Tetrahydrozoline HCl (VISINE OP) Place 1  drop into both eyes daily as needed (dry eyes).     No current facility-administered medications for this visit.    Review of Systems  Constitutional:  Constitutional negative. HENT: HENT negative.  Eyes: Eyes negative.  Cardiovascular: Positive for claudication.  GI: Gastrointestinal negative.  Musculoskeletal: Positive for leg pain.  Skin: Skin negative.  Neurological: Neurological negative. Hematologic: Hematologic/lymphatic negative.  Psychiatric: Psychiatric negative.        Objective:  Objective   Vitals:   07/30/23 1041  BP: 129/70  Pulse: (!) 57  Temp: 97.9 F (36.6 C)  TempSrc:  Temporal  SpO2: 97%  Weight: 204 lb 9.6 oz (92.8 kg)  Height: 5\' 10"  (1.778 m)   Body mass index is 29.36 kg/m.  Physical Exam HENT:     Head: Normocephalic.     Nose: Nose normal.  Eyes:     Pupils: Pupils are equal, round, and reactive to light.  Cardiovascular:     Rate and Rhythm: Normal rate.     Pulses:          Femoral pulses are 0 on the right side and 2+ on the left side.      Popliteal pulses are 0 on the right side.       Dorsalis pedis pulses are 0 on the right side.  Abdominal:     General: Abdomen is flat.  Skin:    General: Skin is warm.     Capillary Refill: Capillary refill takes more than 3 seconds. Severely delayed cap refill right relative to left Neurological:     General: No focal deficit present.     Mental Status: He is alert.  Psychiatric:        Mood and Affect: Mood normal.        Thought Content: Thought content normal.        Judgment: Judgment normal.     Data: +----------+--------+-----+---------------+----------+--------+  RIGHT    PSV cm/sRatioStenosis       Waveform  Comments  +----------+--------+-----+---------------+----------+--------+  CFA Mid   143                         biphasic            +----------+--------+-----+---------------+----------+--------+  CFA Distal143                         biphasic             +----------+--------+-----+---------------+----------+--------+  DFA      244          50-74% stenosisbiphasic            +----------+--------+-----+---------------+----------+--------+  SFA Prox  46                          monophasic          +----------+--------+-----+---------------+----------+--------+  POP Prox  54                          monophasic          +----------+--------+-----+---------------+----------+--------+  POP Distal32                          monophasic          +----------+--------+-----+---------------+----------+--------+       Right Stent(s):  +---------------+--------+--------+--------+----------------------------+  SFA           PSV cm/sStenosisWaveformComments                      +---------------+--------+--------+--------+----------------------------+  Prox to Stent  0                                                     +---------------+--------+--------+--------+----------------------------+  Proximal Stent 0                                                     +---------------+--------+--------+--------+----------------------------+  Mid Stent      0                                                     +---------------+--------+--------+--------+----------------------------+  Distal Stent   55                      reconstitutes via collateral  +---------------+--------+--------+--------+----------------------------+  Distal to Stent54                                                    +---------------+--------+--------+--------+----------------------------+            +----------+--------+-----+--------+--------+--------+  LEFT     PSV cm/sRatioStenosisWaveformComments  +----------+--------+-----+--------+--------+--------+  CFA Distal54                   biphasic          +----------+--------+-----+--------+--------+--------+  POP Prox  58                                      +----------+--------+-----+--------+--------+--------+  POP Distal45                                     +----------+--------+-----+--------+--------+--------+     Left Stent(s):  +---------------+--------+---------------+--------+----------------+  SFA           PSV cm/sStenosis       WaveformComments          +---------------+--------+---------------+--------+----------------+  Prox to Stent  65                     biphasic                  +---------------+--------+---------------+--------+----------------+  Proximal Stent 230     50-99% stenosisbiphasiclow end of range  +---------------+--------+---------------+--------+----------------+  Mid Stent      126                                              +---------------+--------+---------------+--------+----------------+  Distal Stent   213     50-99% stenosis        low end of range  +---------------+--------+---------------+--------+----------------+  Distal to Stent58                                               +---------------+--------+---------------+--------+----------------+            Summary:  Right: No color or spectral Doppler flow observed in the proximal and mid  segments of the SFA stent; however, there is reconstitution in the distal  segment via a collateral.   Left: Diffusely diseased SFA stent with velocities in the proximal and  distal segments suggesting a 50-99% stenosis.  The elevated velocities at the distal end of the stent appear to be within  the stent but may  be native outflow; visualization is suboptimal due to  vessel depth.    ABI Findings:  +---------+------------------+-----+----------+--------+  Right   Rt Pressure (mmHg)IndexWaveform  Comment   +---------+------------------+-----+----------+--------+  Brachial 130                                        +---------+------------------+-----+----------+--------+   PTA     96                0.74 monophasic          +---------+------------------+-----+----------+--------+  DP      88                0.68 monophasic          +---------+------------------+-----+----------+--------+  Great Toe54                0.42                     +---------+------------------+-----+----------+--------+   +---------+------------------+-----+--------+-------+  Left    Lt Pressure (mmHg)IndexWaveformComment  +---------+------------------+-----+--------+-------+  Brachial 130                                     +---------+------------------+-----+--------+-------+  PTA     112               0.86 biphasic         +---------+------------------+-----+--------+-------+  DP      106               0.82 biphasic         +---------+------------------+-----+--------+-------+  Great Toe67                0.52                  +---------+------------------+-----+--------+-------+   +-------+-----------+-----------+------------+------------+  ABI/TBIToday's ABIToday's TBIPrevious ABIPrevious TBI  +-------+-----------+-----------+------------+------------+  Right 0.74       0.42       0.84        0.59          +-------+-----------+-----------+------------+------------+  Left  0.86       0.52       0.89        0.76          +-------+-----------+-----------+------------+------------+       Right ABIs appear decreased. Left ABIs appear essentially unchanged  compared to prior study on 12/18/2022.    Summary:  Right: Resting right ankle-brachial index indicates moderate right lower  extremity arterial disease. The right toe-brachial index is abnormal.   Left: Resting left ankle-brachial index indicates moderate left lower  extremity arterial disease. The left toe-brachial index is abnormal.       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  Capillary refill also significantly delayed on the right relative to left.  As such we will plan for angiography from left common femoral approach possibly treat the right versus plan for surgical intervention he demonstrates good understanding.  We also discussed the probable need for surgery including femoral endarterectomy with or without antegrade right SFA stenting.  Justin Wells has atherosclerosis of the native arteries of the Right  lower extremities causing ischemic rest pain. The patient is on best medical therapy for peripheral arterial disease. The patient has been counseled about the risks of tobacco use in atherosclerotic disease. The patient has been counseled to abstain from any tobacco use. An aortogram with bilateral lower extremity runoff angiography and Right lower extremity intervention and is indicated to better evaluate the patient's lower extremity circulation because of the  limb threatening nature of the patient's diagnosis. Based on the patient's clinical exam and non-invasive data, we anticipate an endovascular intervention in the iliac and femoropopliteal vessels. Stenting and/or athrectomy would be favored because of the improved primary patency of these interventions as compared to plain balloon angioplasty.       Maeola Harman MD [facility]

## 2023-07-30 NOTE — Progress Notes (Signed)
Patient ID: Justin Wells, male   DOB: 06/06/1951, 72 y.o.   MRN: [id number]  Reason for Consult: Follow-up and PAD   Referred by John Giovanni, MD  Subjective:     HPI:  Justin Wells is a 72 y.o. male has a remote history of right lower extremity stenting and more recent left common femoral endarterectomy with stenting of the SFA a couple years ago.  He states that his right foot is now causing him significant pain even at night forcing him to wake up.  He also has associated very short distance claudication states that he really cannot walk to the parking lot without his right calf hurting and forcing him to stop.  He is a former smoker quit many years ago and takes aspirin and a statin.  He is accompanied by his pastor today.  Past Medical History:  Diagnosis Date   Anxiety    Arthritis    "hands; back; legs" (04/18/2016)   Barrett's esophagus    Cerebrovascular disease    CHF (congestive heart failure) (HCC)    "said I had this a few years ago; it went away" (04/18/2016)   Chronic back pain    Coronary artery disease    Depression    Gastroesophageal reflux    H/O hiatal hernia    Headache(784.0)    "probably monthly" (01/27/2013)   Hyperlipidemia    Hypertension    Peripheral arterial disease (HCC)    Seizures (HCC)    "daily the last 5 days" (04/18/2016)   Stroke [facility]) 2009  2010   "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, weak right leg and hand; I've had 2 or 3 strokes; can't remember the dates" (01/27/2013)   Family History  Problem Relation Age of Onset   Heart disease Mother        Heart Disease before age 63   Hypertension Mother    Heart attack Mother    Heart attack Father    Past Surgical History:  Procedure Laterality Date   ABDOMINAL AORTAGRAM N/A 01/26/2013   Procedure: ABDOMINAL Ronny Flurry;  Surgeon: Nada Libman, MD;  Location: [facility] CATH LAB;  Service: Cardiovascular;  Laterality: N/A;   ABDOMINAL AORTOGRAM W/LOWER EXTREMITY Bilateral 05/03/2021    Procedure: ABDOMINAL AORTOGRAM W/LOWER EXTREMITY;  Surgeon: Cephus Shelling, MD;  Location: [facility] INVASIVE CV LAB;  Service: Cardiovascular;  Laterality: Bilateral;   CARDIAC CATHETERIZATION     CHOLECYSTECTOMY N/A 05/08/2016   Procedure: LAPAROSCOPIC CHOLECYSTECTOMY WITH INTRAOPERATIVE CHOLANGIOGRAM;  Surgeon: Manus Rudd, MD;  Location: [facility] OR;  Service: General;  Laterality: N/A;   COLONOSCOPY     CORONARY ANGIOPLASTY WITH STENT PLACEMENT  11/02/2007   "2" (01/27/2013)   DIAGNOSTIC LAPAROSCOPY     "twice after nissen; it kept coming apart " (01/27/2013)   ENDARTERECTOMY FEMORAL Left 05/04/2021   Procedure: COMMON ENDARTERECTOMY FEMORAL WITH LEFT LEG STENT;  Surgeon: Maeola Harman, MD;  Location: [facility] OR;  Service: Vascular;  Laterality: Left;   ESOPHAGOGASTRODUODENOSCOPY     FEMORAL ARTERY STENT Bilateral 01/26/2013   HERNIA REPAIR     INSERTION OF ILIAC STENT Left 05/04/2021   Procedure: INSERTION OF LEFT SUPERFICIAL FEMORAL ARTERY 6 X 80 ELUVIA  STENT;  Surgeon: Maeola Harman, MD;  Location: [facility] OR;  Service: Vascular;  Laterality: Left;   LAPAROSCOPIC GASTRIC BANDING  2010   LAPAROSCOPIC NISSEN FUNDOPLICATION  ?2001   LEFT HEART CATHETERIZATION WITH CORONARY ANGIOGRAM N/A 03/09/2014   Procedure: LEFT HEART CATHETERIZATION WITH  CORONARY ANGIOGRAM;  Surgeon: Iran Ouch, MD;  Location[facility] LAB;  Service: Cardiovascular;  Laterality: N/A;   LOWER EXTREMITY ANGIOGRAM Left 03/02/2013   Procedure: LOWER EXTREMITY ANGIOGRAM;  Surgeon: Nada Libman, MD;  Location: [facility] CATH LAB;  Service: Cardiovascular;  Laterality: Left;   LOWER EXTREMITY ANGIOGRAM Left 05/04/2021   Procedure: LOWER EXTREMITY ANGIOGRAM;  Surgeon: Maeola Harman, MD;  Location[facility] OR;  Service: Vascular;  Laterality: Left;   PATCH ANGIOPLASTY  05/04/2021   Procedure: PATCH ANGIOPLASTY OF LEFT FEMORAL ARTERY USING 1X6 XENOSURE BOVINE PATCH;  Surgeon: Maeola Harman, MD;  Location: [facility] OR;   Service: Vascular;;    Short Social History:  Social History   Tobacco Use   Smoking status: Former    Current packs/day: 0.00    Average packs/day: 2.0 packs/day for 35.0 years (70.0 ttl pk-yrs)    Types: Cigarettes    Start date: 01/28/1965    Quit date: 01/29/2000    Years since quitting: 23.5   Smokeless tobacco: Never  Substance Use Topics   Alcohol use: No    Comment: 04/18/2016  "quit drinking in 07/1999; I'm a recovering alcoholic"    No Known Allergies  Current Outpatient Medications  Medication Sig Dispense Refill   acetaminophen (TYLENOL) 500 MG tablet Take 1,000 mg by mouth every 6 (six) hours as needed for moderate pain.     Aspirin-Caffeine (BC FAST PAIN RELIEF PO) Take 1 packet by mouth daily as needed (pain).     atorvastatin (LIPITOR) 80 MG tablet Take 80 mg by mouth at bedtime.     cephALEXin (KEFLEX) 500 MG capsule Take 1 capsule (500 mg total) by mouth 2 (two) times daily. 6 capsule 0   clopidogrel (PLAVIX) 75 MG tablet Take 1 tablet by mouth once daily 90 tablet 3   fluticasone (FLONASE) 50 MCG/ACT nasal spray Place 2 sprays into both nostrils daily.     HYDROmorphone (DILAUDID) 4 MG tablet Take 4 mg by mouth every 4 (four) hours as needed (for breakthrough pain).     isosorbide mononitrate (IMDUR) 60 MG 24 hr tablet Take 60 mg by mouth every morning.     levETIRAcetam (KEPPRA) 750 MG tablet Take 1 tablet (750 mg total) by mouth 2 (two) times daily. Take 1.5 tablets twice daily (Patient taking differently: Take 750 mg by mouth 2 (two) times daily.) 60 tablet 1   metoprolol succinate (TOPROL-XL) 25 MG 24 hr tablet Take 25 mg by mouth daily.     mirtazapine (REMERON) 15 MG tablet Take 15 mg by mouth at bedtime.     morphine (MS CONTIN) 15 MG 12 hr tablet Take 15 mg by mouth 2 (two) times daily.     nitroGLYCERIN (NITROSTAT) 0.4 MG SL tablet Place 0.4 mg under the tongue every 5 (five) minutes as needed for chest pain.     Tetrahydrozoline HCl (VISINE OP) Place 1  drop into both eyes daily as needed (dry eyes).     No current facility-administered medications for this visit.    Review of Systems  Constitutional:  Constitutional negative. HENT: HENT negative.  Eyes: Eyes negative.  Cardiovascular: Positive for claudication.  GI: Gastrointestinal negative.  Musculoskeletal: Positive for leg pain.  Skin: Skin negative.  Neurological: Neurological negative. Hematologic: Hematologic/lymphatic negative.  Psychiatric: Psychiatric negative.        Objective:  Objective   Vitals:   07/30/23 1041  BP: 129/70  Pulse: (!) 57  Temp: 97.9 F (36.6 C)  TempSrc:  Temporal  SpO2: 97%  Weight: 204 lb 9.6 oz (92.8 kg)  Height: 5\' 10"  (1.778 m)   Body mass index is 29.36 kg/m.  Physical Exam HENT:     Head: Normocephalic.     Nose: Nose normal.  Eyes:     Pupils: Pupils are equal, round, and reactive to light.  Cardiovascular:     Rate and Rhythm: Normal rate.     Pulses:          Femoral pulses are 0 on the right side and 2+ on the left side.      Popliteal pulses are 0 on the right side.       Dorsalis pedis pulses are 0 on the right side.  Abdominal:     General: Abdomen is flat.  Skin:    General: Skin is warm.     Capillary Refill: Capillary refill takes more than 3 seconds. Severely delayed cap refill right relative to left Neurological:     General: No focal deficit present.     Mental Status: He is alert.  Psychiatric:        Mood and Affect: Mood normal.        Thought Content: Thought content normal.        Judgment: Judgment normal.     Data: +----------+--------+-----+---------------+----------+--------+  RIGHT    PSV cm/sRatioStenosis       Waveform  Comments  +----------+--------+-----+---------------+----------+--------+  CFA Mid   143                         biphasic            +----------+--------+-----+---------------+----------+--------+  CFA Distal143                         biphasic             +----------+--------+-----+---------------+----------+--------+  DFA      244          50-74% stenosisbiphasic            +----------+--------+-----+---------------+----------+--------+  SFA Prox  46                          monophasic          +----------+--------+-----+---------------+----------+--------+  POP Prox  54                          monophasic          +----------+--------+-----+---------------+----------+--------+  POP Distal32                          monophasic          +----------+--------+-----+---------------+----------+--------+       Right Stent(s):  +---------------+--------+--------+--------+----------------------------+  SFA           PSV cm/sStenosisWaveformComments                      +---------------+--------+--------+--------+----------------------------+  Prox to Stent  0                                                     +---------------+--------+--------+--------+----------------------------+  Proximal Stent 0                                                     +---------------+--------+--------+--------+----------------------------+  Mid Stent      0                                                     +---------------+--------+--------+--------+----------------------------+  Distal Stent   55                      reconstitutes via collateral  +---------------+--------+--------+--------+----------------------------+  Distal to Stent54                                                    +---------------+--------+--------+--------+----------------------------+            +----------+--------+-----+--------+--------+--------+  LEFT     PSV cm/sRatioStenosisWaveformComments  +----------+--------+-----+--------+--------+--------+  CFA Distal54                   biphasic          +----------+--------+-----+--------+--------+--------+  POP Prox  58                                      +----------+--------+-----+--------+--------+--------+  POP Distal45                                     +----------+--------+-----+--------+--------+--------+     Left Stent(s):  +---------------+--------+---------------+--------+----------------+  SFA           PSV cm/sStenosis       WaveformComments          +---------------+--------+---------------+--------+----------------+  Prox to Stent  65                     biphasic                  +---------------+--------+---------------+--------+----------------+  Proximal Stent 230     50-99% stenosisbiphasiclow end of range  +---------------+--------+---------------+--------+----------------+  Mid Stent      126                                              +---------------+--------+---------------+--------+----------------+  Distal Stent   213     50-99% stenosis        low end of range  +---------------+--------+---------------+--------+----------------+  Distal to Stent58                                               +---------------+--------+---------------+--------+----------------+            Summary:  Right: No color or spectral Doppler flow observed in the proximal and mid  segments of the SFA stent; however, there is reconstitution in the distal  segment via a collateral.   Left: Diffusely diseased SFA stent with velocities in the proximal and  distal segments suggesting a 50-99% stenosis.  The elevated velocities at the distal end of the stent appear to be within  the stent but may  be native outflow; visualization is suboptimal due to  vessel depth.    ABI Findings:  +---------+------------------+-----+----------+--------+  Right   Rt Pressure (mmHg)IndexWaveform  Comment   +---------+------------------+-----+----------+--------+  Brachial 130                                        +---------+------------------+-----+----------+--------+   PTA     96                0.74 monophasic          +---------+------------------+-----+----------+--------+  DP      88                0.68 monophasic          +---------+------------------+-----+----------+--------+  Great Toe54                0.42                     +---------+------------------+-----+----------+--------+   +---------+------------------+-----+--------+-------+  Left    Lt Pressure (mmHg)IndexWaveformComment  +---------+------------------+-----+--------+-------+  Brachial 130                                     +---------+------------------+-----+--------+-------+  PTA     112               0.86 biphasic         +---------+------------------+-----+--------+-------+  DP      106               0.82 biphasic         +---------+------------------+-----+--------+-------+  Great Toe67                0.52                  +---------+------------------+-----+--------+-------+   +-------+-----------+-----------+------------+------------+  ABI/TBIToday's ABIToday's TBIPrevious ABIPrevious TBI  +-------+-----------+-----------+------------+------------+  Right 0.74       0.42       0.84        0.59          +-------+-----------+-----------+------------+------------+  Left  0.86       0.52       0.89        0.76          +-------+-----------+-----------+------------+------------+       Right ABIs appear decreased. Left ABIs appear essentially unchanged  compared to prior study on 12/18/2022.    Summary:  Right: Resting right ankle-brachial index indicates moderate right lower  extremity arterial disease. The right toe-brachial index is abnormal.   Left: Resting left ankle-brachial index indicates moderate left lower  extremity arterial disease. The left toe-brachial index is abnormal.       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  Capillary refill also significantly delayed on the right relative to left.  As such we will plan for angiography from left common femoral approach possibly treat the right versus plan for surgical intervention he demonstrates good understanding.  We also discussed the probable need for surgery including femoral endarterectomy with or without antegrade right SFA stenting.  JOH DROGE has atherosclerosis of the native arteries of the Right  lower extremities causing ischemic rest pain. The patient is on best medical therapy for peripheral arterial disease. The patient has been counseled about the risks of tobacco use in atherosclerotic disease. The patient has been counseled to abstain from any tobacco use. An aortogram with bilateral lower extremity runoff angiography and Right lower extremity intervention and is indicated to better evaluate the patient's lower extremity circulation because of the  limb threatening nature of the patient's diagnosis. Based on the patient's clinical exam and non-invasive data, we anticipate an endovascular intervention in the iliac and femoropopliteal vessels. Stenting and/or athrectomy would be favored because of the improved primary patency of these interventions as compared to plain balloon angioplasty.       Maeola Harman MD [facility]

## 2023-08-05 ENCOUNTER — Telehealth: Payer: Self-pay

## 2023-08-05 ENCOUNTER — Other Ambulatory Visit: Payer: Self-pay

## 2023-08-05 DIAGNOSIS — I70221 Atherosclerosis of native arteries of extremities with rest pain, right leg: Secondary | ICD-10-CM

## 2023-08-05 NOTE — Telephone Encounter (Signed)
Spoke with patient regarding scheduling aortogram. Patient states his wife has the calendar to know when he is available, so would need to speak with her.   Attempted to reach patient's wife at alternate number. Left VM for her to return call.

## 2023-08-05 NOTE — Telephone Encounter (Signed)
Patient's wife returned call. Procedure scheduled for 12/9. Instructions provided and she voiced understanding.

## 2023-08-06 ENCOUNTER — Other Ambulatory Visit (HOSPITAL_COMMUNITY): Payer: Medicare HMO

## 2023-08-06 ENCOUNTER — Ambulatory Visit: Payer: Medicare HMO | Admitting: Vascular Surgery

## 2023-08-06 ENCOUNTER — Encounter (HOSPITAL_COMMUNITY): Payer: Medicare HMO

## 2023-08-11 ENCOUNTER — Ambulatory Visit (HOSPITAL_COMMUNITY)
Admission: RE | Admit: 2023-08-11 | Discharge: 2023-08-11 | Disposition: A | Discharge status: Home / Self Care | Payer: Medicare HMO | Attending: Vascular Surgery | Admitting: Vascular Surgery

## 2023-08-11 ENCOUNTER — Other Ambulatory Visit: Payer: Self-pay

## 2023-08-11 ENCOUNTER — Ambulatory Visit (HOSPITAL_BASED_OUTPATIENT_CLINIC_OR_DEPARTMENT_OTHER): Payer: Medicare HMO

## 2023-08-11 ENCOUNTER — Encounter (HOSPITAL_COMMUNITY)
Admission: RE | Disposition: A | Discharge status: Home / Self Care | Payer: Self-pay | Source: Home / Self Care | Attending: Vascular Surgery

## 2023-08-11 DIAGNOSIS — T82856A Stenosis of peripheral vascular stent, initial encounter: Secondary | ICD-10-CM | POA: Insufficient documentation

## 2023-08-11 DIAGNOSIS — I7090 Unspecified atherosclerosis: Secondary | ICD-10-CM | POA: Diagnosis not present

## 2023-08-11 DIAGNOSIS — Z87891 Personal history of nicotine dependence: Secondary | ICD-10-CM | POA: Insufficient documentation

## 2023-08-11 DIAGNOSIS — Z7982 Long term (current) use of aspirin: Secondary | ICD-10-CM | POA: Insufficient documentation

## 2023-08-11 DIAGNOSIS — I70221 Atherosclerosis of native arteries of extremities with rest pain, right leg: Secondary | ICD-10-CM | POA: Diagnosis present

## 2023-08-11 DIAGNOSIS — Z79899 Other long term (current) drug therapy: Secondary | ICD-10-CM | POA: Diagnosis not present

## 2023-08-11 HISTORY — PX: ABDOMINAL AORTOGRAM W/LOWER EXTREMITY: CATH118223

## 2023-08-11 LAB — POCT I-STAT, CHEM 8
BUN: 17 mg/dL (ref 8–23)
Calcium, Ion: 1.15 mmol/L (ref 1.15–1.40)
Chloride: 103 mmol/L (ref 98–111)
Creatinine, Ser: 0.9 mg/dL (ref 0.61–1.24)
Glucose, Bld: 117 mg/dL — ABNORMAL HIGH (ref 70–99)
HCT: 37 % — ABNORMAL LOW (ref 39.0–52.0)
Hemoglobin: 12.6 g/dL — ABNORMAL LOW (ref 13.0–17.0)
Potassium: 3.8 mmol/L (ref 3.5–5.1)
Sodium: 139 mmol/L (ref 135–145)
TCO2: 24 mmol/L (ref 22–32)

## 2023-08-11 SURGERY — ABDOMINAL AORTOGRAM W/LOWER EXTREMITY
Anesthesia: LOCAL

## 2023-08-11 MED ORDER — LABETALOL HCL 5 MG/ML IV SOLN: 10.0000 mg | INTRAVENOUS | Status: DC | PRN

## 2023-08-11 MED ORDER — MIDAZOLAM HCL 2 MG/2ML IJ SOLN
INTRAMUSCULAR | Status: AC
  Filled 2023-08-11: qty 2

## 2023-08-11 MED ORDER — LIDOCAINE HCL (PF) 1 % IJ SOLN
INTRAMUSCULAR | Status: AC
  Filled 2023-08-11: qty 30

## 2023-08-11 MED ORDER — IODIXANOL 320 MG/ML IV SOLN
INTRAVENOUS | Status: DC | PRN
  Administered 2023-08-11: 65 mL

## 2023-08-11 MED ORDER — MORPHINE SULFATE (PF) 2 MG/ML IV SOLN: 2.0000 mg | INTRAVENOUS | Status: DC | PRN

## 2023-08-11 MED ORDER — OXYCODONE HCL 5 MG PO TABS
5.0000 mg | ORAL_TABLET | ORAL | Status: DC | PRN
  Administered 2023-08-11: 10 mg via ORAL

## 2023-08-11 MED ORDER — ONDANSETRON HCL 4 MG/2ML IJ SOLN: 4.0000 mg | Freq: Four times a day (QID) | INTRAMUSCULAR | Status: DC | PRN

## 2023-08-11 MED ORDER — HYDRALAZINE HCL 20 MG/ML IJ SOLN: 5.0000 mg | INTRAMUSCULAR | Status: DC | PRN

## 2023-08-11 MED ORDER — HEPARIN (PORCINE) IN NACL 1000-0.9 UT/500ML-% IV SOLN
INTRAVENOUS | Status: DC | PRN
  Administered 2023-08-11 (×2): 500 mL

## 2023-08-11 MED ORDER — SODIUM CHLORIDE 0.9 % IV SOLN: INTRAVENOUS | Status: DC

## 2023-08-11 MED ORDER — FENTANYL CITRATE (PF) 100 MCG/2ML IJ SOLN
INTRAMUSCULAR | Status: DC | PRN
  Administered 2023-08-11: 50 ug via INTRAVENOUS

## 2023-08-11 MED ORDER — MIDAZOLAM HCL 2 MG/2ML IJ SOLN
INTRAMUSCULAR | Status: DC | PRN
  Administered 2023-08-11: 2 mg via INTRAVENOUS

## 2023-08-11 MED ORDER — SODIUM CHLORIDE 0.9 % WEIGHT BASED INFUSION
1.0000 mL/kg/h | INTRAVENOUS | Status: DC
Start: 2023-08-11 — End: 2023-08-11

## 2023-08-11 MED ORDER — FENTANYL CITRATE (PF) 100 MCG/2ML IJ SOLN
INTRAMUSCULAR | Status: AC
  Filled 2023-08-11: qty 2

## 2023-08-11 MED ORDER — LIDOCAINE HCL (PF) 1 % IJ SOLN
INTRAMUSCULAR | Status: DC | PRN
  Administered 2023-08-11: 15 mL via INTRADERMAL

## 2023-08-11 MED ORDER — OXYCODONE HCL 5 MG PO TABS
ORAL_TABLET | ORAL | Status: AC
  Filled 2023-08-11: qty 2

## 2023-08-11 SURGICAL SUPPLY — 11 items
CATH OMNI FLUSH 5F 65CM (CATHETERS) IMPLANT
GUIDEWIRE ANGLED .035X150CM (WIRE) IMPLANT
KIT MICROPUNCTURE NIT STIFF (SHEATH) IMPLANT
KIT PV (KITS) ×1 IMPLANT
KIT SYRINGE INJ CVI SPIKEX1 (MISCELLANEOUS) IMPLANT
SET ATX-X65L (MISCELLANEOUS) IMPLANT
SHEATH PINNACLE 5F 10CM (SHEATH) IMPLANT
SHEATH PROBE COVER 6X72 (BAG) IMPLANT
TRANSDUCER W/STOPCOCK (MISCELLANEOUS) ×1 IMPLANT
TRAY PV CATH (CUSTOM PROCEDURE TRAY) ×1 IMPLANT
WIRE STARTER BENTSON 035X150 (WIRE) IMPLANT

## 2023-08-11 NOTE — Progress Notes (Signed)
Patient and wife was given discharge instructions. Both verbalized understanding. 

## 2023-08-11 NOTE — Progress Notes (Signed)
Site area: left groin Site Prior to Removal:  Level 0 Pressure Applied For: 25 minutes Manual:   yes Patient Status During Pull:  stable Post Pull Site:  Level 0 Post Pull Instructions Given:  yes Post Pull Pulses Present: left dp dopplered Dressing Applied:  gauze and tegaderm Bedrest begins @ 1015 Comments:

## 2023-08-11 NOTE — Op Note (Signed)
    Patient name: Justin Wells MRN: [id number] DOB: 03-Feb-1951 Sex: male  08/11/2023 Pre-operative Diagnosis: Atherosclerosis native arteries right lower extremity with occluded right SFA stents and rest pain Post-operative diagnosis:  Same Surgeon:  Luanna Salk. Randie Heinz, MD Procedure Performed: 1.  Percutaneous ultrasound-guided access left common femoral artery 2.  Aortogram with bilateral lower extremity angiography 3.  Catheter selection right common femoral artery and right lower extremity angiogram 4.  Moderate sedation with fentanyl Versed for 24 minutes  Indications: 72 year old male has a history of bilateral lower extremity stenting and left common femoral endarterectomy.  He has right lower extremity rest pain with very short distance claudication is now indicated for angiography from the left common femoral approach with possible intervention of the right lower extremity versus planning for bypass.  Findings: The left groin had significant scar tissue but the left common femoral endarterectomy site was patent.  The right renal artery was patent the left renal artery appeared to have 50% stenosis.  The aorta and bilateral common iliac arteries were patent however there was significant calcifications in the common iliac arteries both hypogastric arteries were patent.  On the left side the external iliac artery is patent into a patulous common femoral endarterectomy site with 50% stenosis of the proximal SFA stent.  On the right side the common femoral artery is heavily calcified with multiple areas of flow-limiting stenosis.  The SFA is flush occluded and there is stenting of the SFA which are all occluded and he reconstitutes above the knee popliteal artery with then runoff to disease distal popliteal artery and TP trunk and peroneal artery and posterior tibial arteries which are all patent.  The SFA slowly fills.  There was a 40 second delay on imaging to adequately image the right  foot.   Procedure:  The patient was identified in the holding area and taken to room 8.  The patient was then placed supine on the table and prepped and draped in the usual sterile fashion.  A time out was called.  Ultrasound was used to evaluate the left common femoral artery.  There was dense scar tissue from previous endarterectomy.  The area was anesthetized 1% lidocaine cannulated with micropuncture needle followed by wire and a sheath and ultrasound image was saved to the permanent record.  Concomitantly we administered fentanyl and Versed as moderate sedation his vital signs were monitored by bedside nursing throughout the case.  We placed a Bentson wire and then serially dilated with 5 [place] dilator and placed a 5 [place] sheath.  Omni catheter was placed to L1 and aortogram was performed.  We then crossed the bifurcation this required a Glidewire and Omni catheter and performed right lower extremity angiography with significant delay.  With the above findings we will plan for bypass.  We then pulled back to the aortic bifurcation perform limited bilateral extremity angiography with pelvic angiogram which demonstrated the stenosis in the left SFA stenting and the right common and external iliac arteries appeared patent with no need for stenting.  Catheter was then removed over a wire.  Sheath will be pulled in postoperative holding.  He tolerated the procedure without immediate complication.  Contrast: 65cc  Jennye Runquist C. Randie Heinz, MD [facility] [place] Office: [phone] Pager: [phone]

## 2023-08-11 NOTE — Interval H&P Note (Signed)
History and Physical Interval Note:  08/11/2023 7:17 AM  Justin Wells  has presented today for surgery, with the diagnosis of Atherosclerosis of native artery of right lower extremity with rest pain.  The various methods of treatment have been discussed with the patient and family. After consideration of risks, benefits and other options for treatment, the patient has consented to  Procedure(s): ABDOMINAL AORTOGRAM W/LOWER EXTREMITY (N/A) as a surgical intervention.  The patient's history has been reviewed, patient examined, no change in status, stable for surgery.  I have reviewed the patient's chart and labs.  Questions were answered to the patient's satisfaction.     Lemar Livings

## 2023-08-11 NOTE — Progress Notes (Signed)
VASCULAR LAB    Right lower extremity vein mapping has been performed.  See CV proc for preliminary results.   Jasman Murri, RVT 08/11/2023, 1:11 PM

## 2023-08-12 ENCOUNTER — Encounter (HOSPITAL_COMMUNITY): Payer: Self-pay | Admitting: Vascular Surgery

## 2023-08-13 ENCOUNTER — Telehealth: Payer: Self-pay | Admitting: *Deleted

## 2023-08-13 NOTE — Telephone Encounter (Signed)
   Pre-operative Risk Assessment    Patient Name: Justin Wells  DOB: [birth year] MRN: [id number]  DATE OF LAST VISIT: 08/03/2019 DR. KUMAR [facility] DATE OF NEXT VISIT: NONE  PT IS NOT FOLLOWED BY OUR PRACTICE; LOOKS LIKE PT FOLLOWS [facility] DR. Lucianne Muss. IF NOT AND IF REFERRING TO OUR PRACTICE WE WILL NEED A NEW PT REFERRAL SENT.   LEFT MESSAGE FOR DR. CAIN'S SURGERY SCHEDULER WILL FAX NOTES BACK AS FYI IN REGARD TO IF REFERRING TO OUR PRACTICE AND WILL NEED NEW PT REFERRAL    Request for Surgical Clearance    Procedure:   RIGHT COMMON FEMORAL ENDARTERECTOMY WITH FEMORAL TO TIBIAL BYPASS  Date of Surgery:  Clearance TBD (AFTER THE 1ST OF THE YR)                                Surgeon:  DR. Lemar Livings Surgeon's Group or Practice Name:  [facility] Phone number:  [phone] Fax number:  [phone]   Type of Clearance Requested:   - Medical ; NO MEDICATIONS INDICATED TO BE HELD   Type of Anesthesia:  General    Additional requests/questions:    Justin Wells   08/13/2023, 1:05 PM

## 2023-08-21 ENCOUNTER — Telehealth: Payer: Self-pay

## 2023-08-21 NOTE — Telephone Encounter (Signed)
Patient is to be scheduled for a right common femoral endarterectomy with femoral-tibial bypass with Dr. Randie Heinz after cardiac clearance. Received a call from [place] at [facility] to inform of a new patient appointment to see [facility] on 09/12/23 at 1pm, which is the first available, has been scheduled. Patient can contact [phone] for any questions.   Attempted to reach patient/wife, but no answer. DPR on file- left a detailed VM with appointment information and contact information for [facility].

## 2023-09-04 ENCOUNTER — Telehealth: Payer: Self-pay

## 2023-09-04 NOTE — Telephone Encounter (Signed)
..  Caller: Patient's wife [name]  Concern: with symptoms of LE pain   Description: gradual  Quality: cramping  Treatments: narcotic analgesics including oxycodone  (Oxycontin , Oxyir) and Meloxicam, Flexeril   Resolution: Advised to go to nearest emergency room for changes or worsening pain.   Tentatively, Cardiology clearance is required before vascular procedure.

## 2023-09-05 ENCOUNTER — Other Ambulatory Visit: Payer: Self-pay

## 2023-09-05 DIAGNOSIS — I70221 Atherosclerosis of native arteries of extremities with rest pain, right leg: Secondary | ICD-10-CM

## 2023-09-10 ENCOUNTER — Telehealth: Payer: Self-pay

## 2023-09-10 NOTE — Telephone Encounter (Signed)
 Received a telephone call from patient's wife requesting to reschedule surgery from Jan 21 because patient's new insurance will not be active until Feb 1. She currently does not have the card information available to provide, but will contact the office back once she access to it. Surgery has been tentatively scheduled for Feb 11. Instructions reviewed and aware to hold Plavix  5 days prior. She voiced understanding to all.

## 2023-09-29 ENCOUNTER — Encounter: Payer: Self-pay | Admitting: Physician Assistant

## 2023-10-08 ENCOUNTER — Encounter (HOSPITAL_COMMUNITY): Payer: Self-pay

## 2023-10-08 NOTE — Progress Notes (Addendum)
 Surgical Instructions    Your procedure is scheduled on October 14, 2023.  Report to [facility] Main Entrance A at 5:30 A.M., then check in with the Admitting office.  Call this number if you have problems the morning of surgery:  [phone]  If you have any questions prior to your surgery date call [phone]: Open Monday-Friday 8am-4pm If you experience any cold or flu symptoms such as cough, fever, chills, shortness of breath, etc. between now and your scheduled surgery, please notify us  at the above number.     Remember:  Do not eat or drink after midnight the night before your surgery      Take these medicines the morning of surgery with A SIP OF WATER  esomeprazole (NEXIUM)  finasteride  (PROSCAR )  fluticasone  (FLONASE )  gabapentin  (NEURONTIN )  isosorbide  mononitrate (IMDUR )  levETIRAcetam  (KEPPRA )  loratadine  (CLARITIN )  metoprolol  succinate (TOPROL -XL)  morphine  (MS CONTIN )  Oxycodone   tamsulosin  (FLOMAX )   IF NEEDED acetaminophen  (TYLENOL )  nitroGLYCERIN  (NITROSTAT )   clopidogrel  (PLAVIX ) Stop 5 days prior to surgery 10-08-23        As of today, STOP taking any Aspirin  (unless otherwise instructed by your surgeon) Aleve, Naproxen, Ibuprofen, Motrin, Advil, Goody's, BC's, all herbal medications, fish oil, and all vitamins.              Do NOT Smoke (Tobacco/Vaping) for 24 hours prior to your procedure.  If you use a CPAP at night, you may bring your mask/headgear for your overnight stay.   Contacts, glasses, piercing's, hearing aid's, dentures or partials may not be worn into surgery, please bring cases for these belongings.    For patients admitted to the hospital, discharge time will be determined by your treatment team.   Patients discharged the day of surgery will not be allowed to drive home, and someone needs to stay with them for 24 hours.  SURGICAL WAITING ROOM VISITATION Patients having surgery or a procedure may have no more than 2 support  people in the waiting area - these visitors may rotate.   Children under the age of 68 must have an adult with them who is not the patient. If the patient needs to stay at the hospital during part of their recovery, the visitor guidelines for inpatient rooms apply. Pre-op nurse will coordinate an appropriate time for 1 support person to accompany patient in pre-op.  This support person may not rotate.   Please refer to the [facility] website for the visitor guidelines for Inpatients (after your surgery is over and you are in a regular room).    Special instructions:   [facility]- Preparing For Surgery  Before surgery, you can play an important role. Because skin is not sterile, your skin needs to be as free of germs as possible. You can reduce the number of germs on your skin by washing with CHG (chlorahexidine gluconate) Soap before surgery.  CHG is an antiseptic cleaner which kills germs and bonds with the skin to continue killing germs even after washing.    Oral Hygiene is also important to reduce your risk of infection.  Remember - BRUSH YOUR TEETH THE MORNING OF SURGERY WITH YOUR REGULAR TOOTHPASTE  Please do not use if you have an allergy to CHG or antibacterial soaps. If your skin becomes reddened/irritated stop using the CHG.  Do not shave (including legs and underarms) for at least 48 hours prior to first CHG shower. It is OK to shave your face.  Please follow these instructions carefully.  Shower the NIGHT BEFORE SURGERY and the MORNING OF SURGERY  If you chose to wash your hair, wash your hair first as usual with your normal shampoo.  After you shampoo, rinse your hair and body thoroughly to remove the shampoo.  Use CHG Soap as you would any other liquid soap. You can apply CHG directly to the skin and wash gently with a scrungie or a clean washcloth.   Apply the CHG Soap to your body ONLY FROM THE NECK DOWN.  Do not use on open wounds or open sores. Avoid contact with your  eyes, ears, mouth and genitals (private parts). Wash Face and genitals (private parts)  with your normal soap.   Wash thoroughly, paying special attention to the area where your surgery will be performed.  Thoroughly rinse your body with warm water from the neck down.  DO NOT shower/wash with your normal soap after using and rinsing off the CHG Soap.  Pat yourself dry with a CLEAN TOWEL.  Wear CLEAN PAJAMAS to bed the night before surgery  Place CLEAN SHEETS on your bed the night before your surgery  DO NOT SLEEP WITH PETS.   Day of Surgery: Take a shower with CHG soap. Do not wear jewelry or makeup Do not wear lotions, powders, perfumes/colognes, or deodorant. Do not shave 48 hours prior to surgery.  Men may shave face and neck. Do not bring valuables to the hospital.  [facility] is not responsible for any belongings or valuables. Do not wear nail polish, gel polish, artificial nails, or any other type of covering on natural nails (fingers and toes) If you have artificial nails or gel coating that need to be removed by a nail salon, please have this removed prior to surgery. Artificial nails or gel coating may interfere with anesthesia's ability to adequately monitor your vital signs. Wear Clean/Comfortable clothing the morning of surgery Remember to brush your teeth WITH YOUR REGULAR TOOTHPASTE.   Please read over the following fact sheets that you were given.    If you received a COVID test during your pre-op visit  it is requested that you wear a mask when out in public, stay away from anyone that may not be feeling well and notify your surgeon if you develop symptoms. If you have been in contact with anyone that has tested positive in the last 10 days please notify you surgeon.

## 2023-10-09 ENCOUNTER — Encounter (HOSPITAL_COMMUNITY)
Admission: RE | Admit: 2023-10-09 | Discharge: 2023-10-09 | Disposition: A | Discharge status: Home / Self Care | Payer: Medicare HMO | Source: Ambulatory Visit | Attending: Vascular Surgery | Admitting: Vascular Surgery

## 2023-10-09 ENCOUNTER — Encounter (HOSPITAL_COMMUNITY): Payer: Self-pay

## 2023-10-09 ENCOUNTER — Other Ambulatory Visit: Payer: Self-pay

## 2023-10-09 VITALS — BP 96/60 | HR 63 | Temp 98.5°F | Resp 18 | Ht 70.5 in | Wt 210.1 lb

## 2023-10-09 DIAGNOSIS — E785 Hyperlipidemia, unspecified: Secondary | ICD-10-CM | POA: Diagnosis not present

## 2023-10-09 DIAGNOSIS — G40909 Epilepsy, unspecified, not intractable, without status epilepticus: Secondary | ICD-10-CM | POA: Insufficient documentation

## 2023-10-09 DIAGNOSIS — I2510 Atherosclerotic heart disease of native coronary artery without angina pectoris: Secondary | ICD-10-CM | POA: Diagnosis not present

## 2023-10-09 DIAGNOSIS — Z7902 Long term (current) use of antithrombotics/antiplatelets: Secondary | ICD-10-CM | POA: Diagnosis not present

## 2023-10-09 DIAGNOSIS — Z79899 Other long term (current) drug therapy: Secondary | ICD-10-CM | POA: Diagnosis not present

## 2023-10-09 DIAGNOSIS — K2270 Barrett's esophagus without dysplasia: Secondary | ICD-10-CM | POA: Diagnosis not present

## 2023-10-09 DIAGNOSIS — Z8673 Personal history of transient ischemic attack (TIA), and cerebral infarction without residual deficits: Secondary | ICD-10-CM | POA: Insufficient documentation

## 2023-10-09 DIAGNOSIS — Z955 Presence of coronary angioplasty implant and graft: Secondary | ICD-10-CM | POA: Insufficient documentation

## 2023-10-09 DIAGNOSIS — Z01818 Encounter for other preprocedural examination: Secondary | ICD-10-CM

## 2023-10-09 DIAGNOSIS — I10 Essential (primary) hypertension: Secondary | ICD-10-CM | POA: Insufficient documentation

## 2023-10-09 DIAGNOSIS — I739 Peripheral vascular disease, unspecified: Secondary | ICD-10-CM | POA: Diagnosis present

## 2023-10-09 DIAGNOSIS — I252 Old myocardial infarction: Secondary | ICD-10-CM | POA: Diagnosis not present

## 2023-10-09 DIAGNOSIS — R001 Bradycardia, unspecified: Secondary | ICD-10-CM | POA: Insufficient documentation

## 2023-10-09 DIAGNOSIS — I70221 Atherosclerosis of native arteries of extremities with rest pain, right leg: Secondary | ICD-10-CM | POA: Insufficient documentation

## 2023-10-09 DIAGNOSIS — Z01812 Encounter for preprocedural laboratory examination: Secondary | ICD-10-CM | POA: Diagnosis not present

## 2023-10-09 DIAGNOSIS — K449 Diaphragmatic hernia without obstruction or gangrene: Secondary | ICD-10-CM | POA: Diagnosis not present

## 2023-10-09 LAB — COMPREHENSIVE METABOLIC PANEL
ALT: 16 U/L (ref 0–44)
AST: 21 U/L (ref 15–41)
Albumin: 3.7 g/dL (ref 3.5–5.0)
Alkaline Phosphatase: 98 U/L (ref 38–126)
Anion gap: 10 (ref 5–15)
BUN: 10 mg/dL (ref 8–23)
CO2: 30 mmol/L (ref 22–32)
Calcium: 9.2 mg/dL (ref 8.9–10.3)
Chloride: 99 mmol/L (ref 98–111)
Creatinine, Ser: 1.04 mg/dL (ref 0.61–1.24)
GFR, Estimated: >60 mL/min (ref >60)
Glucose, Bld: 100 mg/dL — ABNORMAL HIGH (ref 70–99)
Potassium: 4.2 mmol/L (ref 3.5–5.1)
Sodium: 139 mmol/L (ref 135–145)
Total Bilirubin: 0.9 mg/dL (ref 0.0–1.2)
Total Protein: 6.6 g/dL (ref 6.5–8.1)

## 2023-10-09 LAB — URINALYSIS, ROUTINE W REFLEX MICROSCOPIC
Bilirubin Urine: NEGATIVE
Glucose, UA: NEGATIVE mg/dL
Hgb urine dipstick: NEGATIVE
Ketones, ur: NEGATIVE mg/dL
Leukocytes,Ua: NEGATIVE
Nitrite: NEGATIVE
Protein, ur: NEGATIVE mg/dL
Specific Gravity, Urine: 1.01 (ref 1.005–1.030)
pH: 6 (ref 5.0–8.0)

## 2023-10-09 LAB — TYPE AND SCREEN
ABO/RH(D): O NEG
Antibody Screen: NEGATIVE

## 2023-10-09 LAB — CBC
HCT: 39.5 % (ref 39.0–52.0)
Hemoglobin: 13.3 g/dL (ref 13.0–17.0)
MCH: 29.2 pg (ref 26.0–34.0)
MCHC: 33.7 g/dL (ref 30.0–36.0)
MCV: 86.6 fL (ref 80.0–100.0)
Platelets: 210 10*3/uL (ref 150–400)
RBC: 4.56 MIL/uL (ref 4.22–5.81)
RDW: 17.2 % — ABNORMAL HIGH (ref 11.5–15.5)
WBC: 9.2 10*3/uL (ref 4.0–10.5)
nRBC: 0 % (ref 0.0–0.2)

## 2023-10-09 LAB — APTT: aPTT: 32 s (ref 24–36)

## 2023-10-09 LAB — SURGICAL PCR SCREEN
MRSA, PCR: NEGATIVE
Staphylococcus aureus: NEGATIVE

## 2023-10-09 LAB — PROTIME-INR
INR: 1.1 (ref 0.8–1.2)
Prothrombin Time: 14.1 s (ref 11.4–15.2)

## 2023-10-09 NOTE — Progress Notes (Signed)
 PCP - Seena Thom Duncans, MD   Cardiologist - Hope, Harlene Caldron, [place]  PPM/ICD - denies Device Orders - n/a Rep Notified - n/a  Chest x-ray -  EKG - 08-11-23   Stress Test - 04/2016 ECHO - 10/2022 Cardiac Cath - 06-29-18  Sleep Study - denies CPAP - n/a  DM -denies  Blood Thinner Instructions: Plavix  on hold since 10-08-23 Aspirin  Instructions: Dorethia to continue   ERAS Protcol -NPO   COVID TEST- n/a   Anesthesia review: Yes cardiac clearance  Patient denies shortness of breath, fever, cough and chest pain at PAT appointment   All instructions explained to the patient, with a verbal understanding of the material. Patient agrees to go over the instructions while at home for a better understanding. Patient also instructed to self quarantine after being tested for COVID-19. The opportunity to ask questions was provided.

## 2023-10-10 NOTE — Progress Notes (Signed)
 Anesthesia Chart Review:  73 year old male follows with cardiology at [facility] for history of HTN, HLD, CVA, CAD s/p MI 2009 with stent to LAD (patent by cath 2019), nonischemic stress test 10/2021, normal LVEF by echo 10/2022.  Seen by Harlene Slice, PA-C 09/12/2023 for preop evaluation.  Per note, He is cleared for surgery and is at low risk of major cardiac events. Continue BB perioperatively. May hold plavix  5-7 days preoperatively, but should be on 81mg  ASA while off plavix .  Follows with vascular surgery for history of PAD s/p RLE stenting and left common femoral endarterectomy with stenting of the SFA.  Other pertinent history includes GERD, Barrett's esophagus, hiatal hernia, seizures maintained on Keppra   Preop labs reviewed, unremarkable.  EKG 09/12/2023 (Care Everywhere, tracing requested): Sinus bradycardia.  Rate 57.  TTE 10/16/2022 (Care Everywhere): Summary   1. The left ventricle is normal in size with normal wall thickness.    2. The left ventricular systolic function is normal, LVEF is visually  estimated at > 55%.    3. The right ventricle is normal in size, with normal systolic function.    4. The aortic valve is trileaflet with moderately thickened leaflets with  normal excursion.   Nuclear stress 10/12/2021 (Care Everywhere): Impressions:  - Normal myocardial perfusion study  - No evidence for significant ischemia or scar is noted.  - Post stress:  Global systolic function is normal.  The ejection fraction  was greater than 65%.     Lynwood Geofm RIGGERS [facility] Short Stay Center/Anesthesiology Phone [phone] 10/10/2023 12:02 PM

## 2023-10-10 NOTE — Anesthesia Preprocedure Evaluation (Addendum)
Anesthesia Evaluation  Patient identified by MRN, date of birth, ID band Patient awake    Reviewed: Allergy & Precautions, H&P , NPO status , Patient's Chart, lab work & pertinent test results  Airway Mallampati: II   Neck ROM: full    Dental   Pulmonary COPD, former smoker   breath sounds clear to auscultation       Cardiovascular hypertension, + CAD, + Cardiac Stents, + Peripheral Vascular Disease and +CHF   Rhythm:regular Rate:Normal     Neuro/Psych  Headaches, Seizures -,  PSYCHIATRIC DISORDERS Anxiety Depression    CVA    GI/Hepatic hiatal hernia,GERD  ,,  Endo/Other    Renal/GU      Musculoskeletal  (+) Arthritis ,    Abdominal   Peds  Hematology   Anesthesia Other Findings   Reproductive/Obstetrics                             Anesthesia Physical Anesthesia Plan  ASA: 3  Anesthesia Plan: General   Post-op Pain Management:    Induction: Intravenous  PONV Risk Score and Plan: 2 and Ondansetron, Dexamethasone and Treatment may vary due to age or medical condition  Airway Management Planned: Oral ETT  Additional Equipment: Arterial line  Intra-op Plan:   Post-operative Plan: Extubation in OR  Informed Consent: I have reviewed the patients History and Physical, chart, labs and discussed the procedure including the risks, benefits and alternatives for the proposed anesthesia with the patient or authorized representative who has indicated his/her understanding and acceptance.     Dental advisory given  Plan Discussed with: CRNA, Anesthesiologist and Surgeon  Anesthesia Plan Comments: (PAT note by Antionette Poles, PA-C: 73 year old male follows with cardiology at [facility] for history of HTN, HLD, CVA, CAD s/p MI 2009 with stent to LAD (patent by cath 2019), nonischemic stress test 10/2021, normal LVEF by echo 10/2022.  Seen by Berton Mount, PA-C 09/12/2023 for preop evaluation.  Per note,  "He is cleared for surgery and is at low risk of major cardiac events. Continue BB perioperatively. May hold plavix 5-7 days preoperatively, but should be on 81mg  ASA while off plavix."  Follows with vascular surgery for history of PAD s/p RLE stenting and left common femoral endarterectomy with stenting of the SFA.  Other pertinent history includes GERD, Barrett's esophagus, hiatal hernia, seizures maintained on Keppra  Preop labs reviewed, unremarkable.  EKG 09/12/2023 (Care Everywhere, tracing requested): Sinus bradycardia.  Rate 57.  TTE 10/16/2022 (Care Everywhere): Summary  1. The left ventricle is normal in size with normal wall thickness.  2. The left ventricular systolic function is normal, LVEF is visually  estimated at > 55%.  3. The right ventricle is normal in size, with normal systolic function.  4. The aortic valve is trileaflet with moderately thickened leaflets with  normal excursion.   Nuclear stress 10/12/2021 (Care Everywhere): Impressions:  - Normal myocardial perfusion study  - No evidence for significant ischemia or scar is noted.  - Post stress: Global systolic function is normal. The ejection fraction  was greater than 65%.   )        Anesthesia Quick Evaluation

## 2023-10-13 ENCOUNTER — Ambulatory Visit: Payer: Medicare HMO | Admitting: Physician Assistant

## 2023-10-13 ENCOUNTER — Ambulatory Visit: Payer: Medicare HMO

## 2023-10-14 ENCOUNTER — Inpatient Hospital Stay (HOSPITAL_COMMUNITY): Payer: Medicare HMO | Admitting: Anesthesiology

## 2023-10-14 ENCOUNTER — Other Ambulatory Visit: Payer: Self-pay

## 2023-10-14 ENCOUNTER — Inpatient Hospital Stay (HOSPITAL_COMMUNITY): Payer: Self-pay | Admitting: Physician Assistant

## 2023-10-14 ENCOUNTER — Inpatient Hospital Stay (HOSPITAL_COMMUNITY)
Admission: RE | Admit: 2023-10-14 | Discharge: 2023-10-17 | DRG: 271 | Disposition: A | Discharge status: Home / Self Care | Payer: Medicare HMO | Attending: Vascular Surgery | Admitting: Vascular Surgery

## 2023-10-14 ENCOUNTER — Encounter (HOSPITAL_COMMUNITY)
Admission: RE | Disposition: A | Discharge status: Home / Self Care | Payer: Self-pay | Source: Home / Self Care | Attending: Vascular Surgery

## 2023-10-14 DIAGNOSIS — M549 Dorsalgia, unspecified: Secondary | ICD-10-CM | POA: Diagnosis present

## 2023-10-14 DIAGNOSIS — Z87891 Personal history of nicotine dependence: Secondary | ICD-10-CM

## 2023-10-14 DIAGNOSIS — I110 Hypertensive heart disease with heart failure: Secondary | ICD-10-CM

## 2023-10-14 DIAGNOSIS — Z9049 Acquired absence of other specified parts of digestive tract: Secondary | ICD-10-CM

## 2023-10-14 DIAGNOSIS — Z7902 Long term (current) use of antithrombotics/antiplatelets: Secondary | ICD-10-CM | POA: Diagnosis not present

## 2023-10-14 DIAGNOSIS — Z8249 Family history of ischemic heart disease and other diseases of the circulatory system: Secondary | ICD-10-CM

## 2023-10-14 DIAGNOSIS — K219 Gastro-esophageal reflux disease without esophagitis: Secondary | ICD-10-CM | POA: Diagnosis present

## 2023-10-14 DIAGNOSIS — F32A Depression, unspecified: Secondary | ICD-10-CM | POA: Diagnosis present

## 2023-10-14 DIAGNOSIS — E785 Hyperlipidemia, unspecified: Secondary | ICD-10-CM | POA: Diagnosis present

## 2023-10-14 DIAGNOSIS — I70221 Atherosclerosis of native arteries of extremities with rest pain, right leg: Secondary | ICD-10-CM | POA: Diagnosis present

## 2023-10-14 DIAGNOSIS — M19041 Primary osteoarthritis, right hand: Secondary | ICD-10-CM | POA: Diagnosis present

## 2023-10-14 DIAGNOSIS — G510 Bell's palsy: Secondary | ICD-10-CM | POA: Diagnosis present

## 2023-10-14 DIAGNOSIS — Z79899 Other long term (current) drug therapy: Secondary | ICD-10-CM

## 2023-10-14 DIAGNOSIS — K2270 Barrett's esophagus without dysplasia: Secondary | ICD-10-CM | POA: Diagnosis present

## 2023-10-14 DIAGNOSIS — I2510 Atherosclerotic heart disease of native coronary artery without angina pectoris: Secondary | ICD-10-CM

## 2023-10-14 DIAGNOSIS — M1909 Primary osteoarthritis, other specified site: Secondary | ICD-10-CM | POA: Diagnosis present

## 2023-10-14 DIAGNOSIS — M479 Spondylosis, unspecified: Secondary | ICD-10-CM | POA: Diagnosis present

## 2023-10-14 DIAGNOSIS — I509 Heart failure, unspecified: Secondary | ICD-10-CM

## 2023-10-14 DIAGNOSIS — R519 Headache, unspecified: Secondary | ICD-10-CM | POA: Diagnosis present

## 2023-10-14 DIAGNOSIS — G8929 Other chronic pain: Secondary | ICD-10-CM | POA: Diagnosis present

## 2023-10-14 DIAGNOSIS — M79671 Pain in right foot: Secondary | ICD-10-CM | POA: Diagnosis present

## 2023-10-14 DIAGNOSIS — F419 Anxiety disorder, unspecified: Secondary | ICD-10-CM | POA: Diagnosis present

## 2023-10-14 DIAGNOSIS — Z955 Presence of coronary angioplasty implant and graft: Secondary | ICD-10-CM

## 2023-10-14 DIAGNOSIS — M19042 Primary osteoarthritis, left hand: Secondary | ICD-10-CM | POA: Diagnosis present

## 2023-10-14 DIAGNOSIS — R569 Unspecified convulsions: Secondary | ICD-10-CM | POA: Diagnosis present

## 2023-10-14 DIAGNOSIS — I70229 Atherosclerosis of native arteries of extremities with rest pain, unspecified extremity: Principal | ICD-10-CM | POA: Diagnosis present

## 2023-10-14 DIAGNOSIS — I69351 Hemiplegia and hemiparesis following cerebral infarction affecting right dominant side: Secondary | ICD-10-CM | POA: Diagnosis not present

## 2023-10-14 DIAGNOSIS — Z79891 Long term (current) use of opiate analgesic: Secondary | ICD-10-CM

## 2023-10-14 HISTORY — PX: ENDARTERECTOMY FEMORAL: SHX5804

## 2023-10-14 HISTORY — PX: FEMORAL-POPLITEAL BYPASS GRAFT: SHX937

## 2023-10-14 LAB — CBC
HCT: 34.7 % — ABNORMAL LOW (ref 39.0–52.0)
Hemoglobin: 11.9 g/dL — ABNORMAL LOW (ref 13.0–17.0)
MCH: 29.8 pg (ref 26.0–34.0)
MCHC: 34.3 g/dL (ref 30.0–36.0)
MCV: 86.8 fL (ref 80.0–100.0)
Platelets: 174 10*3/uL (ref 150–400)
RBC: 4 MIL/uL — ABNORMAL LOW (ref 4.22–5.81)
RDW: 17 % — ABNORMAL HIGH (ref 11.5–15.5)
WBC: 10.6 10*3/uL — ABNORMAL HIGH (ref 4.0–10.5)
nRBC: 0 % (ref 0.0–0.2)

## 2023-10-14 LAB — CREATININE, SERUM
Creatinine, Ser: 1.06 mg/dL (ref 0.61–1.24)
GFR, Estimated: >60 mL/min (ref >60)

## 2023-10-14 LAB — POCT ACTIVATED CLOTTING TIME
Activated Clotting Time: 239 s
Activated Clotting Time: 291 s
Activated Clotting Time: 227 s

## 2023-10-14 SURGERY — ENDARTERECTOMY, FEMORAL
Anesthesia: General | Site: Leg Upper | Laterality: Right

## 2023-10-14 MED ORDER — MIDAZOLAM HCL 2 MG/2ML IJ SOLN
INTRAMUSCULAR | Status: AC
  Filled 2023-10-14: qty 2

## 2023-10-14 MED ORDER — 0.9 % SODIUM CHLORIDE (POUR BTL) OPTIME
TOPICAL | Status: DC | PRN
  Administered 2023-10-14: 1000 mL

## 2023-10-14 MED ORDER — ACETAMINOPHEN 650 MG RE SUPP: 325.0000 mg | RECTAL | Status: DC | PRN

## 2023-10-14 MED ORDER — LABETALOL HCL 5 MG/ML IV SOLN
INTRAVENOUS | Status: AC
  Filled 2023-10-14: qty 4

## 2023-10-14 MED ORDER — MORPHINE SULFATE (PF) 2 MG/ML IV SOLN
2.0000 mg | INTRAVENOUS | Status: DC | PRN
  Administered 2023-10-14 (×2): 2 mg via INTRAVENOUS
  Administered 2023-10-14: 4 mg via INTRAVENOUS
  Administered 2023-10-14 – 2023-10-15 (×2): 2 mg via INTRAVENOUS
  Administered 2023-10-15 (×2): 4 mg via INTRAVENOUS
  Administered 2023-10-15: 2 mg via INTRAVENOUS
  Filled 2023-10-14 (×2): qty 1
  Filled 2023-10-14 (×2): qty 2
  Filled 2023-10-14: qty 1
  Filled 2023-10-14: qty 2
  Filled 2023-10-14 (×2): qty 1

## 2023-10-14 MED ORDER — HYDROMORPHONE HCL 1 MG/ML IJ SOLN
INTRAMUSCULAR | Status: AC
  Filled 2023-10-14: qty 1

## 2023-10-14 MED ORDER — SUGAMMADEX SODIUM 200 MG/2ML IV SOLN
INTRAVENOUS | Status: DC | PRN
  Administered 2023-10-14: 200 mg via INTRAVENOUS

## 2023-10-14 MED ORDER — PHENYLEPHRINE 80 MCG/ML (10ML) SYRINGE FOR IV PUSH (FOR BLOOD PRESSURE SUPPORT)
PREFILLED_SYRINGE | INTRAVENOUS | Status: AC
  Filled 2023-10-14: qty 10

## 2023-10-14 MED ORDER — DEXAMETHASONE SODIUM PHOSPHATE 10 MG/ML IJ SOLN
INTRAMUSCULAR | Status: AC
  Filled 2023-10-14: qty 1

## 2023-10-14 MED ORDER — SODIUM CHLORIDE 0.9 % IV SOLN: INTRAVENOUS | Status: DC

## 2023-10-14 MED ORDER — CEFAZOLIN SODIUM-DEXTROSE 2-4 GM/100ML-% IV SOLN
2.0000 g | INTRAVENOUS | Status: AC
  Administered 2023-10-14: 2 g via INTRAVENOUS
  Filled 2023-10-14: qty 100

## 2023-10-14 MED ORDER — ALUM & MAG HYDROXIDE-SIMETH 200-200-20 MG/5ML PO SUSP: 15.0000 mL | ORAL | Status: DC | PRN

## 2023-10-14 MED ORDER — DOCUSATE SODIUM 100 MG PO CAPS
100.0000 mg | ORAL_CAPSULE | Freq: Every day | ORAL | Status: DC
  Administered 2023-10-15 – 2023-10-17 (×3): 100 mg via ORAL
  Filled 2023-10-14 (×3): qty 1

## 2023-10-14 MED ORDER — FENTANYL CITRATE (PF) 100 MCG/2ML IJ SOLN
25.0000 ug | INTRAMUSCULAR | Status: DC | PRN
  Administered 2023-10-14: 50 ug via INTRAVENOUS
  Administered 2023-10-14: 25 ug via INTRAVENOUS
  Administered 2023-10-14: 50 ug via INTRAVENOUS
  Administered 2023-10-14: 25 ug via INTRAVENOUS

## 2023-10-14 MED ORDER — BISACODYL 5 MG PO TBEC: 5.0000 mg | DELAYED_RELEASE_TABLET | Freq: Every day | ORAL | Status: DC | PRN

## 2023-10-14 MED ORDER — FENTANYL CITRATE (PF) 250 MCG/5ML IJ SOLN
INTRAMUSCULAR | Status: DC | PRN
  Administered 2023-10-14 (×2): 50 ug via INTRAVENOUS
  Administered 2023-10-14: 100 ug via INTRAVENOUS
  Administered 2023-10-14: 50 ug via INTRAVENOUS

## 2023-10-14 MED ORDER — CLOPIDOGREL BISULFATE 75 MG PO TABS
75.0000 mg | ORAL_TABLET | Freq: Every day | ORAL | Status: DC
  Administered 2023-10-15 – 2023-10-17 (×3): 75 mg via ORAL
  Filled 2023-10-14 (×3): qty 1

## 2023-10-14 MED ORDER — PROTAMINE SULFATE 10 MG/ML IV SOLN
INTRAVENOUS | Status: AC
  Filled 2023-10-14: qty 5

## 2023-10-14 MED ORDER — ACETAMINOPHEN 10 MG/ML IV SOLN
INTRAVENOUS | Status: AC
  Filled 2023-10-14: qty 100

## 2023-10-14 MED ORDER — CHLORHEXIDINE GLUCONATE CLOTH 2 % EX PADS: 6.0000 | MEDICATED_PAD | Freq: Once | CUTANEOUS | Status: DC

## 2023-10-14 MED ORDER — PHENOL 1.4 % MT LIQD: 1.0000 | OROMUCOSAL | Status: DC | PRN

## 2023-10-14 MED ORDER — HEPARIN SODIUM (PORCINE) 1000 UNIT/ML IJ SOLN
INTRAMUSCULAR | Status: AC
  Filled 2023-10-14: qty 10

## 2023-10-14 MED ORDER — HEMOSTATIC AGENTS (NO CHARGE) OPTIME
TOPICAL | Status: DC | PRN
  Administered 2023-10-14: 1 via TOPICAL

## 2023-10-14 MED ORDER — OXYCODONE HCL 5 MG PO TABS: 5.0000 mg | ORAL_TABLET | Freq: Once | ORAL | Status: DC | PRN

## 2023-10-14 MED ORDER — HEPARIN 6000 UNIT IRRIGATION SOLUTION
Status: AC
  Filled 2023-10-14: qty 500

## 2023-10-14 MED ORDER — PROPOFOL 10 MG/ML IV BOLUS
INTRAVENOUS | Status: DC | PRN
Start: 2023-10-14 — End: 2023-10-14
  Administered 2023-10-14: 160 mg via INTRAVENOUS

## 2023-10-14 MED ORDER — TAMSULOSIN HCL 0.4 MG PO CAPS
0.4000 mg | ORAL_CAPSULE | Freq: Every morning | ORAL | Status: DC
  Administered 2023-10-15 – 2023-10-17 (×3): 0.4 mg via ORAL
  Filled 2023-10-14 (×3): qty 1

## 2023-10-14 MED ORDER — SENNOSIDES-DOCUSATE SODIUM 8.6-50 MG PO TABS: 1.0000 | ORAL_TABLET | Freq: Every evening | ORAL | Status: DC | PRN

## 2023-10-14 MED ORDER — EPHEDRINE SULFATE-NACL 50-0.9 MG/10ML-% IV SOSY
PREFILLED_SYRINGE | INTRAVENOUS | Status: DC | PRN
  Administered 2023-10-14 (×2): 10 mg via INTRAVENOUS
  Administered 2023-10-14: 5 mg via INTRAVENOUS
  Administered 2023-10-14: 10 mg via INTRAVENOUS
  Administered 2023-10-14: 5 mg via INTRAVENOUS

## 2023-10-14 MED ORDER — HYDROMORPHONE HCL 1 MG/ML IJ SOLN
0.2500 mg | INTRAMUSCULAR | Status: DC | PRN
  Administered 2023-10-14: 0.25 mg via INTRAVENOUS
  Administered 2023-10-14 (×2): 0.5 mg via INTRAVENOUS
  Administered 2023-10-14: 0.25 mg via INTRAVENOUS

## 2023-10-14 MED ORDER — MIRTAZAPINE 15 MG PO TABS
15.0000 mg | ORAL_TABLET | Freq: Every day | ORAL | Status: DC
  Administered 2023-10-14 – 2023-10-16 (×3): 15 mg via ORAL
  Filled 2023-10-14 (×3): qty 1

## 2023-10-14 MED ORDER — HEPARIN 6000 UNIT IRRIGATION SOLUTION
Status: DC | PRN
  Administered 2023-10-14: 1

## 2023-10-14 MED ORDER — ONDANSETRON HCL 4 MG/2ML IJ SOLN
INTRAMUSCULAR | Status: DC | PRN
  Administered 2023-10-14: 4 mg via INTRAVENOUS

## 2023-10-14 MED ORDER — IRBESARTAN 75 MG PO TABS
37.5000 mg | ORAL_TABLET | Freq: Every day | ORAL | Status: DC
  Administered 2023-10-15 – 2023-10-17 (×3): 37.5 mg via ORAL
  Filled 2023-10-14 (×3): qty 0.5

## 2023-10-14 MED ORDER — HEPARIN SODIUM (PORCINE) 5000 UNIT/ML IJ SOLN
5000.0000 [IU] | Freq: Three times a day (TID) | INTRAMUSCULAR | Status: DC
  Administered 2023-10-15 – 2023-10-17 (×7): 5000 [IU] via SUBCUTANEOUS
  Filled 2023-10-14 (×7): qty 1

## 2023-10-14 MED ORDER — ISOSORBIDE MONONITRATE ER 60 MG PO TB24
60.0000 mg | ORAL_TABLET | Freq: Every morning | ORAL | Status: DC
  Administered 2023-10-15 – 2023-10-17 (×3): 60 mg via ORAL
  Filled 2023-10-14 (×3): qty 1

## 2023-10-14 MED ORDER — EPHEDRINE 5 MG/ML INJ
INTRAVENOUS | Status: AC
  Filled 2023-10-14: qty 5

## 2023-10-14 MED ORDER — ONDANSETRON HCL 4 MG/2ML IJ SOLN: 4.0000 mg | Freq: Four times a day (QID) | INTRAMUSCULAR | Status: DC | PRN

## 2023-10-14 MED ORDER — METOPROLOL TARTRATE 5 MG/5ML IV SOLN: 2.0000 mg | INTRAVENOUS | Status: DC | PRN

## 2023-10-14 MED ORDER — CEFAZOLIN SODIUM-DEXTROSE 2-4 GM/100ML-% IV SOLN
2.0000 g | Freq: Three times a day (TID) | INTRAVENOUS | Status: AC
  Administered 2023-10-14 – 2023-10-15 (×2): 2 g via INTRAVENOUS
  Filled 2023-10-14 (×2): qty 100

## 2023-10-14 MED ORDER — SODIUM CHLORIDE 0.9 % IV SOLN: 500.0000 mL | Freq: Once | INTRAVENOUS | Status: DC | PRN

## 2023-10-14 MED ORDER — MAGNESIUM SULFATE 2 GM/50ML IV SOLN: 2.0000 g | Freq: Every day | INTRAVENOUS | Status: DC | PRN

## 2023-10-14 MED ORDER — LABETALOL HCL 5 MG/ML IV SOLN: 10.0000 mg | INTRAVENOUS | Status: DC | PRN

## 2023-10-14 MED ORDER — GUAIFENESIN-DM 100-10 MG/5ML PO SYRP: 15.0000 mL | ORAL_SOLUTION | ORAL | Status: DC | PRN

## 2023-10-14 MED ORDER — ACETAMINOPHEN 325 MG PO TABS
325.0000 mg | ORAL_TABLET | ORAL | Status: DC | PRN
  Administered 2023-10-14: 325 mg via ORAL
  Administered 2023-10-16: 650 mg via ORAL
  Filled 2023-10-14: qty 1
  Filled 2023-10-14: qty 2

## 2023-10-14 MED ORDER — GABAPENTIN 100 MG PO CAPS
100.0000 mg | ORAL_CAPSULE | Freq: Two times a day (BID) | ORAL | Status: DC
  Administered 2023-10-14 – 2023-10-17 (×6): 100 mg via ORAL
  Filled 2023-10-14 (×6): qty 1

## 2023-10-14 MED ORDER — FENTANYL CITRATE (PF) 100 MCG/2ML IJ SOLN
INTRAMUSCULAR | Status: AC
  Filled 2023-10-14: qty 2

## 2023-10-14 MED ORDER — DEXMEDETOMIDINE HCL IN NACL 80 MCG/20ML IV SOLN
INTRAVENOUS | Status: DC | PRN
  Administered 2023-10-14 (×2): 8 ug via INTRAVENOUS

## 2023-10-14 MED ORDER — LORATADINE 10 MG PO TABS
10.0000 mg | ORAL_TABLET | Freq: Every morning | ORAL | Status: DC
  Administered 2023-10-15 – 2023-10-17 (×3): 10 mg via ORAL
  Filled 2023-10-14 (×3): qty 1

## 2023-10-14 MED ORDER — PHENYLEPHRINE 80 MCG/ML (10ML) SYRINGE FOR IV PUSH (FOR BLOOD PRESSURE SUPPORT)
PREFILLED_SYRINGE | INTRAVENOUS | Status: DC | PRN
  Administered 2023-10-14 (×3): 80 ug via INTRAVENOUS

## 2023-10-14 MED ORDER — ONDANSETRON HCL 4 MG/2ML IJ SOLN
INTRAMUSCULAR | Status: AC
  Filled 2023-10-14: qty 2

## 2023-10-14 MED ORDER — FINASTERIDE 5 MG PO TABS
5.0000 mg | ORAL_TABLET | Freq: Every morning | ORAL | Status: DC
  Administered 2023-10-15 – 2023-10-17 (×3): 5 mg via ORAL
  Filled 2023-10-14 (×3): qty 1

## 2023-10-14 MED ORDER — POTASSIUM CHLORIDE CRYS ER 20 MEQ PO TBCR: 20.0000 meq | EXTENDED_RELEASE_TABLET | Freq: Every day | ORAL | Status: DC | PRN

## 2023-10-14 MED ORDER — PANTOPRAZOLE SODIUM 40 MG PO TBEC
80.0000 mg | DELAYED_RELEASE_TABLET | Freq: Every day | ORAL | Status: DC
  Administered 2023-10-14 – 2023-10-17 (×4): 80 mg via ORAL
  Filled 2023-10-14 (×4): qty 2

## 2023-10-14 MED ORDER — FENTANYL CITRATE (PF) 250 MCG/5ML IJ SOLN
INTRAMUSCULAR | Status: AC
  Filled 2023-10-14: qty 5

## 2023-10-14 MED ORDER — OXYCODONE HCL 5 MG/5ML PO SOLN: 5.0000 mg | Freq: Once | ORAL | Status: DC | PRN

## 2023-10-14 MED ORDER — METOPROLOL SUCCINATE ER 25 MG PO TB24
25.0000 mg | ORAL_TABLET | Freq: Every morning | ORAL | Status: DC
  Administered 2023-10-15 – 2023-10-17 (×3): 25 mg via ORAL
  Filled 2023-10-14 (×3): qty 1

## 2023-10-14 MED ORDER — DEXAMETHASONE SODIUM PHOSPHATE 10 MG/ML IJ SOLN
INTRAMUSCULAR | Status: DC | PRN
  Administered 2023-10-14: 5 mg via INTRAVENOUS

## 2023-10-14 MED ORDER — LACTATED RINGERS IV SOLN: INTRAVENOUS | Status: DC | PRN

## 2023-10-14 MED ORDER — MIDAZOLAM HCL 2 MG/2ML IJ SOLN
INTRAMUSCULAR | Status: DC | PRN
Start: 2023-10-14 — End: 2023-10-14
  Administered 2023-10-14: 2 mg via INTRAVENOUS

## 2023-10-14 MED ORDER — ROCURONIUM BROMIDE 10 MG/ML (PF) SYRINGE
PREFILLED_SYRINGE | INTRAVENOUS | Status: DC | PRN
  Administered 2023-10-14: 20 mg via INTRAVENOUS
  Administered 2023-10-14: 50 mg via INTRAVENOUS
  Administered 2023-10-14: 20 mg via INTRAVENOUS
  Administered 2023-10-14: 10 mg via INTRAVENOUS

## 2023-10-14 MED ORDER — PROTAMINE SULFATE 10 MG/ML IV SOLN
INTRAVENOUS | Status: DC | PRN
  Administered 2023-10-14: 25 mg via INTRAVENOUS

## 2023-10-14 MED ORDER — HEPARIN SODIUM (PORCINE) 1000 UNIT/ML IJ SOLN
INTRAMUSCULAR | Status: DC | PRN
  Administered 2023-10-14: 10000 [IU] via INTRAVENOUS
  Administered 2023-10-14: 3000 [IU] via INTRAVENOUS

## 2023-10-14 MED ORDER — ASPIRIN 81 MG PO TBEC
81.0000 mg | DELAYED_RELEASE_TABLET | Freq: Every day | ORAL | Status: DC
  Administered 2023-10-14 – 2023-10-17 (×4): 81 mg via ORAL
  Filled 2023-10-14 (×4): qty 1

## 2023-10-14 MED ORDER — ORAL CARE MOUTH RINSE: 15.0000 mL | Freq: Once | OROMUCOSAL | Status: AC

## 2023-10-14 MED ORDER — HYDRALAZINE HCL 20 MG/ML IJ SOLN: 5.0000 mg | INTRAMUSCULAR | Status: DC | PRN

## 2023-10-14 MED ORDER — ROCURONIUM BROMIDE 10 MG/ML (PF) SYRINGE
PREFILLED_SYRINGE | INTRAVENOUS | Status: AC
  Filled 2023-10-14: qty 10

## 2023-10-14 MED ORDER — PROPOFOL 10 MG/ML IV BOLUS
INTRAVENOUS | Status: AC
  Filled 2023-10-14: qty 20

## 2023-10-14 MED ORDER — ACETAMINOPHEN 10 MG/ML IV SOLN
INTRAVENOUS | Status: DC | PRN
  Administered 2023-10-14: 1000 mg via INTRAVENOUS

## 2023-10-14 MED ORDER — ATORVASTATIN CALCIUM 80 MG PO TABS
80.0000 mg | ORAL_TABLET | Freq: Every day | ORAL | Status: DC
  Administered 2023-10-14 – 2023-10-16 (×3): 80 mg via ORAL
  Filled 2023-10-14 (×3): qty 1

## 2023-10-14 MED ORDER — HYDROCODONE-ACETAMINOPHEN 7.5-325 MG/15ML PO SOLN
10.0000 mL | Freq: Four times a day (QID) | ORAL | Status: DC | PRN
  Administered 2023-10-14: 10 mL via ORAL
  Filled 2023-10-14: qty 15

## 2023-10-14 MED ORDER — LEVETIRACETAM 750 MG PO TABS
750.0000 mg | ORAL_TABLET | Freq: Two times a day (BID) | ORAL | Status: DC
  Administered 2023-10-14 – 2023-10-17 (×6): 750 mg via ORAL
  Filled 2023-10-14 (×7): qty 1

## 2023-10-14 MED ORDER — CHLORHEXIDINE GLUCONATE 0.12 % MT SOLN
15.0000 mL | Freq: Once | OROMUCOSAL | Status: AC
  Administered 2023-10-14: 15 mL via OROMUCOSAL
  Filled 2023-10-14: qty 15

## 2023-10-14 MED ORDER — LIDOCAINE 2% (20 MG/ML) 5 ML SYRINGE
INTRAMUSCULAR | Status: DC | PRN
  Administered 2023-10-14: 60 mg via INTRAVENOUS

## 2023-10-14 MED ORDER — LIDOCAINE 2% (20 MG/ML) 5 ML SYRINGE
INTRAMUSCULAR | Status: AC
  Filled 2023-10-14: qty 5

## 2023-10-14 SURGICAL SUPPLY — 44 items
BAG COUNTER SPONGE SURGICOUNT (BAG) ×3 IMPLANT
BANDAGE ESMARK 6X9 LF (GAUZE/BANDAGES/DRESSINGS) IMPLANT
BNDG ESMARK 6X9 LF (GAUZE/BANDAGES/DRESSINGS)
CANISTER SUCT 3000ML PPV (MISCELLANEOUS) ×3 IMPLANT
CANNULA VESSEL 3MM 2 BLNT TIP (CANNULA) IMPLANT
CLIP LIGATING EXTRA MED SLVR (CLIP) ×3 IMPLANT
CLIP LIGATING EXTRA SM BLUE (MISCELLANEOUS) ×3 IMPLANT
COVER PROBE W GEL 5X96 (DRAPES) ×1 IMPLANT
CUFF TOURN SGL QUICK 42 (TOURNIQUET CUFF) IMPLANT
CUFF TRNQT CYL 24X4X16.5-23 (TOURNIQUET CUFF) IMPLANT
CUFF TRNQT CYL 34X4.125X (TOURNIQUET CUFF) IMPLANT
DERMABOND ADVANCED .7 DNX12 (GAUZE/BANDAGES/DRESSINGS) ×6 IMPLANT
DRAPE C-ARM 42X72 X-RAY (DRAPES) IMPLANT
DRAPE HALF SHEET 40X57 (DRAPES) IMPLANT
ELECT REM PT RETURN 9FT ADLT (ELECTROSURGICAL) ×3
ELECTRODE REM PT RTRN 9FT ADLT (ELECTROSURGICAL) ×2 IMPLANT
GLOVE BIO SURGEON STRL SZ7.5 (GLOVE) ×3 IMPLANT
GOWN STRL REUS W/ TWL LRG LVL3 (GOWN DISPOSABLE) ×6 IMPLANT
GOWN STRL REUS W/ TWL XL LVL3 (GOWN DISPOSABLE) ×3 IMPLANT
GRAFT PROPATEN W/RING 6X80X60 (Vascular Products) ×1 IMPLANT
HEMOSTAT SNOW SURGICEL 2X4 (HEMOSTASIS) IMPLANT
INSERT FOGARTY SM (MISCELLANEOUS) ×1 IMPLANT
KIT BASIN OR (CUSTOM PROCEDURE TRAY) ×3 IMPLANT
KIT TURNOVER KIT B (KITS) ×3 IMPLANT
MARKER GRAFT CORONARY BYPASS (MISCELLANEOUS) IMPLANT
NS IRRIG 1000ML POUR BTL (IV SOLUTION) ×6 IMPLANT
PACK PERIPHERAL VASCULAR (CUSTOM PROCEDURE TRAY) ×3 IMPLANT
PAD ARMBOARD 7.5X6 YLW CONV (MISCELLANEOUS) ×6 IMPLANT
PATCH VASC XENOSURE 1X6 (Vascular Products) ×1 IMPLANT
POWDER SURGICEL 3.0 GRAM (HEMOSTASIS) ×1 IMPLANT
SET COLLECT BLD 21X3/4 12 (NEEDLE) IMPLANT
SPONGE T-LAP 18X18 ~~LOC~~+RFID (SPONGE) ×1 IMPLANT
SUT MNCRL AB 4-0 PS2 18 (SUTURE) ×6 IMPLANT
SUT PROLENE 5 0 C 1 24 (SUTURE) ×8 IMPLANT
SUT PROLENE 6 0 BV (SUTURE) ×10 IMPLANT
SUT SILK 2 0 SH (SUTURE) ×3 IMPLANT
SUT SILK 3-0 18XBRD TIE 12 (SUTURE) IMPLANT
SUT VIC AB 2-0 CT1 TAPERPNT 27 (SUTURE) ×6 IMPLANT
SUT VIC AB 3-0 SH 27X BRD (SUTURE) ×6 IMPLANT
TOWEL GREEN STERILE (TOWEL DISPOSABLE) ×6 IMPLANT
TOWEL GREEN STERILE FF (TOWEL DISPOSABLE) ×3 IMPLANT
TRAY FOLEY MTR SLVR 16FR STAT (SET/KITS/TRAYS/PACK) ×3 IMPLANT
UNDERPAD 30X36 HEAVY ABSORB (UNDERPADS AND DIAPERS) ×3 IMPLANT
WATER STERILE IRR 1000ML POUR (IV SOLUTION) ×3 IMPLANT

## 2023-10-14 NOTE — Transfer of Care (Signed)
Immediate Anesthesia Transfer of Care Note  Patient: Justin Wells  Procedure(s) Performed: RIGHT COMMON FEMORAL ENDARTERECTOMY WITH PATCH ANGIOPLASTY (Right: Groin) BYPASS GRAFT FEMORAL-POPLITEAL ARTERY (Right: Leg Upper)  Patient Location: PACU  Anesthesia Type:General  Level of Consciousness: drowsy  Airway & Oxygen Therapy: Patient Spontanous Breathing and Patient connected to face mask oxygen  Post-op Assessment: Report given to RN and Post -op Vital signs reviewed and stable  Post vital signs: Reviewed and stable  Last Vitals:  Vitals Value Taken Time  BP 117/66 10/14/23 1100  Temp 36.9 C 10/14/23 1100  Pulse 70 10/14/23 1104  Resp 10 10/14/23 1104  SpO2 99 % 10/14/23 1104  Vitals shown include unfiled device data.  Last Pain:  Vitals:   10/14/23 0651  PainSc: 9       Patients Stated Pain Goal: 3 (10/14/23 1610)  Complications: There were no known notable events for this encounter.

## 2023-10-14 NOTE — Anesthesia Procedure Notes (Signed)
Procedure Name: Intubation Date/Time: 10/14/2023 7:38 AM  Performed by: Colbert Coyer, CRNAPre-anesthesia Checklist: Patient identified, Emergency Drugs available, Suction available and Patient being monitored Patient Re-evaluated:Patient Re-evaluated prior to induction Oxygen Delivery Method: Circle System Utilized Preoxygenation: Pre-oxygenation with 100% oxygen Induction Type: IV induction Ventilation: Mask ventilation without difficulty Laryngoscope Size: Mac and 4 Grade View: Grade I Tube type: Oral Tube size: 7.5 mm Number of attempts: 1 Airway Equipment and Method: Stylet Placement Confirmation: ETT inserted through vocal cords under direct vision, positive ETCO2 and breath sounds checked- equal and bilateral Secured at: 22 cm Tube secured with: Tape Dental Injury: Teeth and Oropharynx as per pre-operative assessment

## 2023-10-14 NOTE — Progress Notes (Signed)
Patient arrived in the unit, V/S taken, CCMD notified, CHG bath given, incision sites looks clean and dry, has B/L deplorable pulse on PT and DP.   10/14/23 1325  Vitals  Temp 98 F (36.7 C)  Temp Source Oral  BP 114/76  MAP (mmHg) 83  BP Location Right Arm  BP Method Automatic  Patient Position (if appropriate) Lying  Pulse Rate 77  Pulse Rate Source Monitor  ECG Heart Rate 80  Resp 18  Level of Consciousness  Level of Consciousness Alert  MEWS COLOR  MEWS Score Color Green  Oxygen Therapy  SpO2 97 %  O2 Device Nasal Cannula  O2 Flow Rate (L/min) 2 L/min  Art Line  Arterial Line BP 132/56  Arterial Line MAP (mmHg) 72 mmHg  Arterial Line Location Left radial  Pain Assessment  Pain Scale 0-10  Pain Score 10  Pain Type Surgical pain  Pain Location Leg  Pain Orientation Right  Pain Descriptors / Indicators Aching  Pain Frequency Constant  Pain Onset On-going  Patients Stated Pain Goal 0  Pain Intervention(s) Medication (See eMAR)  MEWS Score  MEWS Temp 0  MEWS Systolic 0  MEWS Pulse 0  MEWS RR 0  MEWS LOC 0  MEWS Score 0

## 2023-10-14 NOTE — H&P (Signed)
HPI:   Justin Wells is a 73 y.o. male has a remote history of right lower extremity stenting and more recent left common femoral endarterectomy with stenting of the SFA a couple years ago.  He states that his right foot is now causing him significant pain even at night forcing him to wake up.  He also has associated very short distance claudication states that he really cannot walk to the parking lot without his right calf hurting and forcing him to stop.  He is a former smoker quit many years ago and takes aspirin and a statin.  He is accompanied by his wife today.       Past Medical History:  Diagnosis Date   Anxiety     Arthritis      "hands; back; legs" (04/18/2016)   Barrett's esophagus     Cerebrovascular disease     CHF (congestive heart failure) (HCC)      "said I had this a few years ago; it went away" (04/18/2016)   Chronic back pain     Coronary artery disease     Depression     Gastroesophageal reflux     H/O hiatal hernia     Headache(784.0)      "probably monthly" (01/27/2013)   Hyperlipidemia     Hypertension     Peripheral arterial disease (HCC)     Seizures (HCC)      "daily the last 5 days" (04/18/2016)   Stroke [facility]) 2009  2010    "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, weak right leg and hand; I've had 2 or 3 strokes; can't remember the dates" (01/27/2013)             Family History  Problem Relation Age of Onset   Heart disease Mother          Heart Disease before age 81   Hypertension Mother     Heart attack Mother     Heart attack Father               Past Surgical History:  Procedure Laterality Date   ABDOMINAL AORTAGRAM N/A 01/26/2013    Procedure: ABDOMINAL Ronny Flurry;  Surgeon: Nada Libman, MD;  Location: [facility] CATH LAB;  Service: Cardiovascular;  Laterality: N/A;   ABDOMINAL AORTOGRAM W/LOWER EXTREMITY Bilateral 05/03/2021    Procedure: ABDOMINAL AORTOGRAM W/LOWER EXTREMITY;  Surgeon: Cephus Shelling, MD;  Location: [facility] INVASIVE CV  LAB;  Service: Cardiovascular;  Laterality: Bilateral;   CARDIAC CATHETERIZATION       CHOLECYSTECTOMY N/A 05/08/2016    Procedure: LAPAROSCOPIC CHOLECYSTECTOMY WITH INTRAOPERATIVE CHOLANGIOGRAM;  Surgeon: Manus Rudd, MD;  Location: [facility] OR;  Service: General;  Laterality: N/A;   COLONOSCOPY       CORONARY ANGIOPLASTY WITH STENT PLACEMENT   11/02/2007    "2" (01/27/2013)   DIAGNOSTIC LAPAROSCOPY        "twice after nissen; it kept coming apart " (01/27/2013)   ENDARTERECTOMY FEMORAL Left 05/04/2021    Procedure: COMMON ENDARTERECTOMY FEMORAL WITH LEFT LEG STENT;  Surgeon: Maeola Harman, MD;  Location: [facility] OR;  Service: Vascular;  Laterality: Left;   ESOPHAGOGASTRODUODENOSCOPY       FEMORAL ARTERY STENT Bilateral 01/26/2013   HERNIA REPAIR       INSERTION OF ILIAC STENT Left 05/04/2021    Procedure: INSERTION OF LEFT SUPERFICIAL FEMORAL ARTERY 6 X 80 ELUVIA  STENT;  Surgeon: Maeola Harman, MD;  Location: [facility] OR;  Service: Vascular;  Laterality: Left;   LAPAROSCOPIC GASTRIC BANDING   2010   LAPAROSCOPIC NISSEN FUNDOPLICATION   ?2001   LEFT HEART CATHETERIZATION WITH CORONARY ANGIOGRAM N/A 03/09/2014    Procedure: LEFT HEART CATHETERIZATION WITH CORONARY ANGIOGRAM;  Surgeon: Iran Ouch, MD;  Location: [facility] CATH LAB;  Service: Cardiovascular;  Laterality: N/A;   LOWER EXTREMITY ANGIOGRAM Left 03/02/2013    Procedure: LOWER EXTREMITY ANGIOGRAM;  Surgeon: Nada Libman, MD;  Location: [facility] CATH LAB;  Service: Cardiovascular;  Laterality: Left;   LOWER EXTREMITY ANGIOGRAM Left 05/04/2021    Procedure: LOWER EXTREMITY ANGIOGRAM;  Surgeon: Maeola Harman, MD;  Location: [facility] OR;  Service: Vascular;  Laterality: Left;   PATCH ANGIOPLASTY   05/04/2021    Procedure: PATCH ANGIOPLASTY OF LEFT FEMORAL ARTERY USING 1X6 XENOSURE BOVINE PATCH;  Surgeon: Maeola Harman, MD;  Location: [facility] OR;  Service: Vascular;;          Short Social History:  Social History          Tobacco Use   Smoking status: Former      Current packs/day: 0.00      Average packs/day: 2.0 packs/day for 35.0 years (70.0 ttl pk-yrs)      Types: Cigarettes      Start date: 01/28/1965      Quit date: 01/29/2000      Years since quitting: 23.5   Smokeless tobacco: Never  Substance Use Topics   Alcohol use: No      Comment: 04/18/2016  "quit drinking in 07/1999; I'm a recovering alcoholic"      Allergies  No Known Allergies           Current Outpatient Medications  Medication Sig Dispense Refill   acetaminophen (TYLENOL) 500 MG tablet Take 1,000 mg by mouth every 6 (six) hours as needed for moderate pain.       Aspirin-Caffeine (BC FAST PAIN RELIEF PO) Take 1 packet by mouth daily as needed (pain).       atorvastatin (LIPITOR) 80 MG tablet Take 80 mg by mouth at bedtime.       cephALEXin (KEFLEX) 500 MG capsule Take 1 capsule (500 mg total) by mouth 2 (two) times daily. 6 capsule 0   clopidogrel (PLAVIX) 75 MG tablet Take 1 tablet by mouth once daily 90 tablet 3   fluticasone (FLONASE) 50 MCG/ACT nasal spray Place 2 sprays into both nostrils daily.       HYDROmorphone (DILAUDID) 4 MG tablet Take 4 mg by mouth every 4 (four) hours as needed (for breakthrough pain).       isosorbide mononitrate (IMDUR) 60 MG 24 hr tablet Take 60 mg by mouth every morning.       levETIRAcetam (KEPPRA) 750 MG tablet Take 1 tablet (750 mg total) by mouth 2 (two) times daily. Take 1.5 tablets twice daily (Patient taking differently: Take 750 mg by mouth 2 (two) times daily.) 60 tablet 1   metoprolol succinate (TOPROL-XL) 25 MG 24 hr tablet Take 25 mg by mouth daily.       mirtazapine (REMERON) 15 MG tablet Take 15 mg by mouth at bedtime.       morphine (MS CONTIN) 15 MG 12 hr tablet Take 15 mg by mouth 2 (two) times daily.       nitroGLYCERIN (NITROSTAT) 0.4 MG SL tablet Place 0.4 mg under the tongue every 5 (five) minutes as needed for chest pain.       Tetrahydrozoline HCl (VISINE OP) Place 1 drop into  both eyes daily as needed (dry eyes).          No current facility-administered medications for this visit.        Review of Systems  Constitutional:  Constitutional negative. HENT: HENT negative.  Eyes: Eyes negative.  Cardiovascular: Positive for claudication.  GI: Gastrointestinal negative.  Musculoskeletal: Positive for leg pain.  Skin: Skin negative.  Neurological: Neurological negative. Hematologic: Hematologic/lymphatic negative.  Psychiatric: Psychiatric negative.          Objective:    Vitals:   10/14/23 0557  BP: 118/77  Pulse: 66  Resp: 18  Temp: 98.4 F (36.9 C)  SpO2: 96%      Physical Exam HENT:     Head: Normocephalic.     Nose: Nose normal.  Eyes:     Pupils: Pupils are equal, round, and reactive to light.  Cardiovascular:     Rate and Rhythm: Normal rate.     Pulses:          Femoral pulses are 0 on the right side and 2+ on the left side.      Popliteal pulses are 0 on the right side.       Dorsalis pedis pulses are 0 on the right side.  Abdominal:     General: Abdomen is flat.  Skin:    General: Skin is warm.     Capillary Refill: Capillary refill takes more than 3 seconds. Severely delayed cap refill right relative to left Neurological:     General: No focal deficit present.     Mental Status: He is alert.  Psychiatric:        Mood and Affect: Mood normal.        Thought Content: Thought content normal.        Judgment: Judgment normal.        Data: +----------+--------+-----+---------------+----------+--------+  RIGHT    PSV cm/sRatioStenosis       Waveform  Comments  +----------+--------+-----+---------------+----------+--------+  CFA Mid   143                         biphasic            +----------+--------+-----+---------------+----------+--------+  CFA Distal143                         biphasic            +----------+--------+-----+---------------+----------+--------+  DFA      244          50-74%  stenosisbiphasic            +----------+--------+-----+---------------+----------+--------+  SFA Prox  46                          monophasic          +----------+--------+-----+---------------+----------+--------+  POP Prox  54                          monophasic          +----------+--------+-----+---------------+----------+--------+  POP Distal32                          monophasic          +----------+--------+-----+---------------+----------+--------+       Right Stent(s):  +---------------+--------+--------+--------+----------------------------+  SFA           PSV cm/sStenosisWaveformComments                      +---------------+--------+--------+--------+----------------------------+  Prox to Stent  0                                                     +---------------+--------+--------+--------+----------------------------+  Proximal Stent 0                                                     +---------------+--------+--------+--------+----------------------------+  Mid Stent      0                                                     +---------------+--------+--------+--------+----------------------------+  Distal Stent   55                      reconstitutes via collateral  +---------------+--------+--------+--------+----------------------------+  Distal to Stent54                                                    +---------------+--------+--------+--------+----------------------------+            +----------+--------+-----+--------+--------+--------+  LEFT     PSV cm/sRatioStenosisWaveformComments  +----------+--------+-----+--------+--------+--------+  CFA Distal54                   biphasic          +----------+--------+-----+--------+--------+--------+  POP Prox  58                                     +----------+--------+-----+--------+--------+--------+  POP Distal45                                      +----------+--------+-----+--------+--------+--------+     Left Stent(s):  +---------------+--------+---------------+--------+----------------+  SFA           PSV cm/sStenosis       WaveformComments          +---------------+--------+---------------+--------+----------------+  Prox to Stent  65                     biphasic                  +---------------+--------+---------------+--------+----------------+  Proximal Stent 230     50-99% stenosisbiphasiclow end of range  +---------------+--------+---------------+--------+----------------+  Mid Stent      126                                              +---------------+--------+---------------+--------+----------------+  Distal Stent   213     50-99% stenosis        low end of range  +---------------+--------+---------------+--------+----------------+  Distal to Stent58                                               +---------------+--------+---------------+--------+----------------+  Summary:  Right: No color or spectral Doppler flow observed in the proximal and mid  segments of the SFA stent; however, there is reconstitution in the distal  segment via a collateral.   Left: Diffusely diseased SFA stent with velocities in the proximal and  distal segments suggesting a 50-99% stenosis.  The elevated velocities at the distal end of the stent appear to be within  the stent but may be native outflow; visualization is suboptimal due to  vessel depth.      ABI Findings:  +---------+------------------+-----+----------+--------+  Right   Rt Pressure (mmHg)IndexWaveform  Comment   +---------+------------------+-----+----------+--------+  Brachial 130                                        +---------+------------------+-----+----------+--------+  PTA     96                0.74 monophasic           +---------+------------------+-----+----------+--------+  DP      88                0.68 monophasic          +---------+------------------+-----+----------+--------+  Great Toe54                0.42                     +---------+------------------+-----+----------+--------+   +---------+------------------+-----+--------+-------+  Left    Lt Pressure (mmHg)IndexWaveformComment  +---------+------------------+-----+--------+-------+  Brachial 130                                     +---------+------------------+-----+--------+-------+  PTA     112               0.86 biphasic         +---------+------------------+-----+--------+-------+  DP      106               0.82 biphasic         +---------+------------------+-----+--------+-------+  Great Toe67                0.52                  +---------+------------------+-----+--------+-------+   +-------+-----------+-----------+------------+------------+  ABI/TBIToday's ABIToday's TBIPrevious ABIPrevious TBI  +-------+-----------+-----------+------------+------------+  Right 0.74       0.42       0.84        0.59          +-------+-----------+-----------+------------+------------+  Left  0.86       0.52       0.89        0.76          +-------+-----------+-----------+------------+------------+       Right ABIs appear decreased. Left ABIs appear essentially unchanged  compared to prior study on 12/18/2022.    Summary:  Right: Resting right ankle-brachial index indicates moderate right lower  extremity arterial disease. The right toe-brachial index is abnormal.   Left: Resting left ankle-brachial index indicates moderate left lower  extremity arterial disease. The left toe-brachial index is abnormal.         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  Capillary refill also significantly delayed on the right relative to left.  He has undergone angiography from the left common femoral approach which demonstrated severely calcified right common femoral artery with occluded stents in the right SFA with reconstitution of the above-knee popliteal artery but disease at the tibial trifurcation.  He has undergone vein mapping which is marginal although he does appear to have venous reflux disease throughout the right lower extremity with telangiectasias and few areas of reticular veins.  We have discussed the likelihood of harvesting the right greater saphenous vein, performing right common femoral endarterectomy with right femoral to below-knee popliteal bypass.  I discussed the risk benefits alternatives and he demonstrates good understanding in the presence of his wife.   Neymar Dowe C. Randie Heinz, MD [facility] Office: [phone] Pager: [phone]

## 2023-10-14 NOTE — Discharge Instructions (Signed)

## 2023-10-14 NOTE — Progress Notes (Signed)
Waste 5 ml of Hycet 7.5mg /15 ml solution to steri cycle witnessed by Silas Flood. RN(CN).

## 2023-10-14 NOTE — Anesthesia Procedure Notes (Signed)
Arterial Line Insertion Start/End2/07/2024 7:40 AM, 10/14/2023 7:45 AM Performed by: Achille Rich, MD, Colbert Coyer, CRNA, CRNA  Patient location: Pre-op. Preanesthetic checklist: patient identified, IV checked, site marked, risks and benefits discussed, surgical consent, monitors and equipment checked, pre-op evaluation, timeout performed and anesthesia consent Patient sedated Left, radial was placed Catheter size: 20 G Hand hygiene performed  and maximum sterile barriers used   Attempts: 1 Procedure performed without using ultrasound guided technique. Following insertion, dressing applied. Post procedure assessment: normal and unchanged  Patient tolerated the procedure well with no immediate complications.

## 2023-10-14 NOTE — Progress Notes (Signed)
  Day of Surgery Note    Subjective:  says he hurts in his groin and around his knee.  Says the pain in his foot is better than before surgery.  Very appreciative for his care.    Vitals:   10/14/23 1200 10/14/23 1215  BP: 100/63 92/65  Pulse: 71 72  Resp: 15 (!) 9  Temp:    SpO2: 95% 96%    Incisions:   right groin and right above knee incisions are clean without hematoma Extremities:  + right PT and AT doppler signals present Cardiac:  regular Lungs:  non labored    Assessment/Plan:  This is a 73 y.o. male who is s/p  Right CFA endarterectomy and right CFA to AK popliteal artery bypass with 6 mm ringed PTFE and popliteal artery endarterectomy  -pt with + doppler flow right PT and AT.  Subjectively, his right foot pain is improved.  Incisions look fine.  -continue asa/statin/Plavix -to 4 east later this afternoon.   Doreatha Massed, PA-C 10/14/2023 12:30 PM [phone]

## 2023-10-14 NOTE — Op Note (Signed)
Patient name: Justin Wells MRN: [id number] DOB: Mar 06, 1951 Sex: male  10/14/2023 Pre-operative Diagnosis: Atherosclerosis native arteries right lower extremity with rest pain  Post-operative diagnosis:  Same Surgeon:  Luanna Salk. Randie Heinz, MD Assistant: Nathanial Rancher, PA Procedure Performed: 1.  Extensive right common femoral endarterectomy including external iliac, profunda and superficial femoral arteries with bovine pericardial patch angioplasty 2.  Right common femoral to above-knee popliteal artery bypass graft with 6 mm ringed PTFE including right popliteal artery endarterectomy  Indications: 73 year old male with history of multiple lower extremity procedures with occluded stents of the right SFA and very short distance claudication and rest pain.  He has undergone angiography which demonstrates significant calcified right common femoral disease he also has occluded SFA and reconstitutes above the knee SFA although he does have disease below the knee as well and we have planned for bypass to the above-knee with PTFE versus below the knee with vein.  In experience assistant was necessary to facilitate exposure of the common femoral artery and its branches as well as performing extensive endarterectomy and patch angioplasty and tunneling PTFE graft and performing anastomosis to the common femoral artery as well as the above-knee popliteal artery which also included endarterectomy.  Findings: Common femoral artery was significantly calcified there was no palpable pulse there.  An extensive endarterectomy was performed including up under the inguinal ligament several centimeters to include the external iliac artery and we establish very strong inflow.  An endarterectomy was performed of the SFA and we did get some backbleeding as well as the profunda and there was very strong backbleeding from the profunda.  The vein was very diminutive particularly in the thigh was not amenable for use as a  bypass conduit.  As such I elected to evaluate the above-knee popliteal artery which was calcified in the mid segment but there were soft areas amenable for clamping and there was Doppler signal distal to this and therefore this was elected as the target for PTFE grafting although there is below-knee disease on angiography.  Endarterectomy was performed to the above-knee popliteal artery we did have strong antegrade and retrograde bleeding and at completion there was strong posterior tibial and peroneal signals at the ankle which did augment with compression of the graft.   Procedure:  The patient was identified in the holding area and taken to the operating room where is placed supine operative table general anesthesia was induced.  He was sterilely prepped and draped in the right lower extremity in the usual fashion, antibiotics were administered and a timeout was called.  Ultrasound was used to identify the right lower extremity first great saphenous vein which was very diminutive in the thigh and did not appear to be suitable for bypass.  We also identified the common femoral artery as this was not palpable.  A vertical incision was created in the groin we dissected down to the common femoral artery which was densely scarred and with evidence of previous Pro-glide and StarClose devices in place.  We dissected this free as well as multiple side branches down to the SFA and the profunda and up under the inguinal ligament we divided the crossing vein and encircled the external neck artery which remained calcified that high.  We then turned our attention to the above-knee for we made a standard above-knee popliteal artery exposure incision and dissected down and identified the popliteal vein and a small popliteal vein was divided there was a lateral popliteal vein which remained patent.  The popliteal artery did appear soft proximally and distally although calcified in the midsegment and we checked with Doppler  and there was a signal in medial distal amenable for clamping and we placed Vesseloops around the 2 soft areas.  We then tunneled 6 mm ringed PTFE graft and the patient was fully heparinized.  After this we clamped the profunda and the SFA followed by the external iliac artery hide part of the inguinal ligament.  The common femoral artery was opened longitudinally.  We then placed a 4 Fogarty with a stopcock under the external iliac artery and inflated this to release the clamp.  Extensive endarterectomy was performed and we establish very strong inflow.  We also performed endarterectomy of the SFA and establish backbleeding in the profunda there was pulsatile backbleeding.  All these vessels were reclamped the common femoral artery was flushed heparinized saline and a bovine pericardial patch was sewn in place with 5-0 Prolene suture.  Prior to completion we allowed flushing all directions.  We then reclamped the vessels opened the patch and spatulated our graft and sewn this into side to the patch with 6-0 Prolene suture.  Upon completion we then flushed through the graft itself and then this was clamped proximally and the clamps were released on the common femoral artery and its branches.  We then turned our attention distally.  The popliteal artery and the 2 soft segments was clamped proximally and distally and opened longitudinally.  We did perform endarterectomy there and there was quite strong antegrade and retrograde flow into the popliteal artery.  We did have to tack the plaque on the posterior wall going distally and this was done in 2 places with 6-0 Prolene suture.  We then straighten her leg trimmed the graft to size and spatulated this and sewed this into side with 6-0 Prolene suture.  Prior completion without flushing all directions.  Upon completion there was very strong flow distally in the popliteal artery confirmed with Doppler and strong posterior tibial peroneal artery signals at the ankle that  were graft dependent.  With this we administered 25 mg of protamine and obtain meticulous hemostasis in the wounds.  The wounds were irrigated closed in layers with Vicryl and Monocryl.  Dermabond is placed at the skin level.  The patient was awakened from anesthesia having tolerated the procedure without any complication.  Counts were correct at completion.   EBL: 300cc  Algie Westry C. Randie Heinz, MD [facility] [place] Office: [phone] Pager: [phone]

## 2023-10-15 ENCOUNTER — Encounter (HOSPITAL_COMMUNITY): Payer: Self-pay | Admitting: Vascular Surgery

## 2023-10-15 LAB — CBC
HCT: 35.5 % — ABNORMAL LOW (ref 39.0–52.0)
Hemoglobin: 12 g/dL — ABNORMAL LOW (ref 13.0–17.0)
MCH: 29.3 pg (ref 26.0–34.0)
MCHC: 33.8 g/dL (ref 30.0–36.0)
MCV: 86.6 fL (ref 80.0–100.0)
Platelets: 171 10*3/uL (ref 150–400)
RBC: 4.1 MIL/uL — ABNORMAL LOW (ref 4.22–5.81)
RDW: 17.2 % — ABNORMAL HIGH (ref 11.5–15.5)
WBC: 8.6 10*3/uL (ref 4.0–10.5)
nRBC: 0 % (ref 0.0–0.2)

## 2023-10-15 LAB — BASIC METABOLIC PANEL
Anion gap: 12 (ref 5–15)
BUN: 10 mg/dL (ref 8–23)
CO2: 24 mmol/L (ref 22–32)
Calcium: 8.4 mg/dL — ABNORMAL LOW (ref 8.9–10.3)
Chloride: 101 mmol/L (ref 98–111)
Creatinine, Ser: 1 mg/dL (ref 0.61–1.24)
GFR, Estimated: >60 mL/min (ref >60)
Glucose, Bld: 128 mg/dL — ABNORMAL HIGH (ref 70–99)
Potassium: 3.5 mmol/L (ref 3.5–5.1)
Sodium: 137 mmol/L (ref 135–145)

## 2023-10-15 LAB — LIPID PANEL
Cholesterol: 99 mg/dL (ref 0–200)
HDL: 43 mg/dL (ref >40)
LDL Cholesterol: 44 mg/dL (ref 0–99)
Total CHOL/HDL Ratio: 2.3 {ratio}
Triglycerides: 60 mg/dL (ref <150)
VLDL: 12 mg/dL (ref 0–40)

## 2023-10-15 MED ORDER — HYDROCODONE-ACETAMINOPHEN 7.5-325 MG/15ML PO SOLN: 10.0000 mL | ORAL | Status: DC | PRN

## 2023-10-15 MED ORDER — HYDROCODONE-ACETAMINOPHEN 7.5-325 MG/15ML PO SOLN
10.0000 mL | ORAL | Status: DC | PRN
  Administered 2023-10-15 – 2023-10-16 (×4): 10 mL via ORAL
  Filled 2023-10-15 (×4): qty 15

## 2023-10-15 MED ORDER — MORPHINE SULFATE (PF) 2 MG/ML IV SOLN
2.0000 mg | INTRAVENOUS | Status: DC | PRN
  Administered 2023-10-15: 4 mg via INTRAVENOUS
  Administered 2023-10-15 – 2023-10-16 (×3): 2 mg via INTRAVENOUS
  Administered 2023-10-16: 4 mg via INTRAVENOUS
  Administered 2023-10-17 (×2): 2 mg via INTRAVENOUS
  Filled 2023-10-15 (×2): qty 1
  Filled 2023-10-15: qty 2
  Filled 2023-10-15: qty 1
  Filled 2023-10-15: qty 2
  Filled 2023-10-15 (×2): qty 1

## 2023-10-15 NOTE — Plan of Care (Signed)
  Problem: Education: Goal: Knowledge of General Education information will improve Description: Including pain rating scale, medication(s)/side effects and non-pharmacologic comfort measures Outcome: Progressing   Problem: Health Behavior/Discharge Planning: Goal: Ability to manage health-related needs will improve Outcome: Progressing   Problem: Clinical Measurements: Goal: Ability to maintain clinical measurements within normal limits will improve Outcome: Progressing Goal: Will remain free from infection Outcome: Progressing Goal: Diagnostic test results will improve Outcome: Progressing Goal: Respiratory complications will improve Outcome: Progressing Goal: Cardiovascular complication will be avoided Outcome: Progressing   Problem: Activity: Goal: Risk for activity intolerance will decrease Outcome: Progressing   Problem: Nutrition: Goal: Adequate nutrition will be maintained Outcome: Progressing   Problem: Coping: Goal: Level of anxiety will decrease Outcome: Progressing   Problem: Elimination: Goal: Will not experience complications related to bowel motility Outcome: Progressing Goal: Will not experience complications related to urinary retention Outcome: Progressing   Problem: Pain Managment: Goal: General experience of comfort will improve and/or be controlled Outcome: Progressing   Problem: Safety: Goal: Ability to remain free from injury will improve Outcome: Progressing   Problem: Skin Integrity: Goal: Risk for impaired skin integrity will decrease Outcome: Progressing   Problem: Education: Goal: Knowledge of prescribed regimen will improve Outcome: Progressing   Problem: Activity: Goal: Ability to tolerate increased activity will improve Outcome: Progressing   Problem: Bowel/Gastric: Goal: Gastrointestinal status for postoperative course will improve Outcome: Progressing   Problem: Clinical Measurements: Goal: Postoperative complications  will be avoided or minimized Outcome: Progressing Goal: Signs and symptoms of graft occlusion will improve Outcome: Progressing   Problem: Skin Integrity: Goal: Demonstration of wound healing without infection will improve Outcome: Progressing   Problem: Education: Goal: Knowledge of prescribed regimen will improve Outcome: Progressing   Problem: Activity: Goal: Ability to tolerate increased activity will improve Outcome: Progressing   Problem: Bowel/Gastric: Goal: Gastrointestinal status for postoperative course will improve Outcome: Progressing   Problem: Clinical Measurements: Goal: Postoperative complications will be avoided or minimized Outcome: Progressing Goal: Signs and symptoms of graft occlusion will improve Outcome: Progressing   Problem: Skin Integrity: Goal: Demonstration of wound healing without infection will improve Outcome: Progressing

## 2023-10-15 NOTE — Evaluation (Signed)
Physical Therapy Evaluation Patient Details Name: Justin Wells MRN: [id number] DOB: 12-10-1950 Today's Date: 10/15/2023  History of Present Illness  Pt is 73 year old presented to [facility] on  10/14/23 for RLE endarterectomy and bypass graft. 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  Clinical Impression  Pt admitted with above diagnosis and presents to PT with functional limitations due to deficits listed below (See PT problem list). Pt needs skilled PT to maximize independence and safety. Expect pt will make good progress with mobility and doubt will need any follow up PT.           If plan is discharge home, recommend the following: Help with stairs or ramp for entrance;Assistance with cooking/housework;A little help with bathing/dressing/bathroom   Can travel by private vehicle        Equipment Recommendations Rolling walker (2 wheels)  Recommendations for Other Services       Functional Status Assessment Patient has had a recent decline in their functional status and demonstrates the ability to make significant improvements in function in a reasonable and predictable amount of time.     Precautions / Restrictions Precautions Precautions: None Restrictions Weight Bearing Restrictions Per Provider Order: No      Mobility  Bed Mobility Overal bed mobility: Needs Assistance Bed Mobility: Supine to Sit     Supine to sit: Min assist, HOB elevated     General bed mobility comments: Assist to pull trunk up into sitting    Transfers Overall transfer level: Needs assistance Equipment used: Rolling walker (2 wheels) Transfers: Sit to/from Stand, Bed to chair/wheelchair/BSC Sit to Stand: Contact guard assist   Step pivot transfers: Contact guard assist       General transfer comment: Assist for safety and lines    Ambulation/Gait Ambulation/Gait assistance: Contact guard assist Gait Distance (Feet): 150 Feet Assistive  device: Rolling walker (2 wheels) Gait Pattern/deviations: Step-through pattern, Decreased stance time - right, Antalgic, Trunk flexed Gait velocity: decr Gait velocity interpretation: 1.31 - 2.62 ft/sec, indicative of limited community ambulator   General Gait Details: Assist for safety and lines  Stairs            Wheelchair Mobility     Tilt Bed    Modified Rankin (Stroke Patients Only)       Balance Overall balance assessment: Mild deficits observed, not formally tested                                           Pertinent Vitals/Pain Pain Assessment Pain Assessment: 0-10 Pain Score: 8  Pain Location: RLE Pain Descriptors / Indicators: Burning, Shooting, Sharp Pain Intervention(s): Limited activity within patient's tolerance, Premedicated before session, Repositioned    Home Living Family/patient expects to be discharged to:: Private residence Living Arrangements: Spouse/significant other Available Help at Discharge: Family;Available 24 hours/day Type of Home: House Home Access: Stairs to enter Entrance Stairs-Rails: Left Entrance Stairs-Number of Steps: 3   Home Layout: One level Home Equipment: None      Prior Function Prior Level of Function : Independent/Modified Independent;Driving             Mobility Comments: No assistive device       Extremity/Trunk Assessment   Upper Extremity Assessment Upper Extremity Assessment: Defer to OT evaluation    Lower Extremity Assessment Lower Extremity Assessment: RLE deficits/detail  RLE Deficits / Details: limited by pain       Communication   Communication Communication: No apparent difficulties    Cognition Arousal: Alert Behavior During Therapy: WFL for tasks assessed/performed   PT - Cognitive impairments: No apparent impairments                         Following commands: Intact       Cueing       General Comments      Exercises      Assessment/Plan    PT Assessment Patient needs continued PT services  PT Problem List Decreased strength;Decreased mobility;Pain;Decreased knowledge of use of DME       PT Treatment Interventions DME instruction;Gait training;Stair training;Functional mobility training;Therapeutic activities;Therapeutic exercise;Patient/family education    PT Goals (Current goals can be found in the Care Plan section)  Acute Rehab PT Goals Patient Stated Goal: return home PT Goal Formulation: With patient Time For Goal Achievement: 10/22/23 Potential to Achieve Goals: Good    Frequency Min 1X/week     Co-evaluation               AM-PAC PT "6 Clicks" Mobility  Outcome Measure Help needed turning from your back to your side while in a flat bed without using bedrails?: A Little Help needed moving from lying on your back to sitting on the side of a flat bed without using bedrails?: A Little Help needed moving to and from a bed to a chair (including a wheelchair)?: A Little Help needed standing up from a chair using your arms (e.g., wheelchair or bedside chair)?: A Little Help needed to walk in hospital room?: A Little Help needed climbing 3-5 steps with a railing? : A Little 6 Click Score: 18    End of Session   Activity Tolerance: Patient tolerated treatment well Patient left: in chair;with call bell/phone within reach Nurse Communication: Mobility status PT Visit Diagnosis: Other abnormalities of gait and mobility (R26.89);Pain Pain - Right/Left: Right Pain - part of body: Leg    Time: 8295-6213 PT Time Calculation (min) (ACUTE ONLY): 31 min   Charges:   PT Evaluation $PT Eval Low Complexity: 1 Low PT Treatments $Gait Training: 8-22 mins PT General Charges $$ ACUTE PT VISIT: 1 Visit         [facility] PT Acute Rehabilitation Services Office [phone]   Angelina Ok [facility] 10/15/2023, 9:42 AM

## 2023-10-15 NOTE — Evaluation (Signed)
Occupational Therapy Evaluation Patient Details Name: Justin Wells MRN: [id number] DOB: [birth year] Today's Date: 10/15/2023   History of Present Illness   History of Present Illness: Pt is 73 year old presented to [facility] on  10/14/23 for RLE endarterectomy and bypass graft. 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     Clinical Impressions At baseline, pt is Independent with ADLs, IADLs, and drives. Pt now presents with decreased activity tolerance, pain affecting functional level, mildly decreased dynamic standing balance, and decreased safety and independence with functional tasks. Pt currently demonstrates ability to complete UB ADLs Independent to Contact guard assist, LB ADLs with Contact guard to Mod assist, and functional mobility/transfers with a RW with Contact guard assist. VSS on RA throughout session. Pt participated well in session and is motivated to return to PLOF. OT expects pt to make quick progress toward goals once pain level is controlled. Pt will benefit from acute skilled OT services to address deficits outlined below and increase safety and independence with functional tasks. No post-acute skilled OT needs are anticipated at discharge.       If plan is discharge home, recommend the following:   A little help with walking and/or transfers;A lot of help with bathing/dressing/bathroom;Assistance with cooking/housework;Assist for transportation;Help with stairs or ramp for entrance     Functional Status Assessment   Patient has had a recent decline in their functional status and demonstrates the ability to make significant improvements in function in a reasonable and predictable amount of time.     Equipment Recommendations   Other (comment) (TBD based on pt progress; suspect no equipment needed once pain level is controlled)     Recommendations for Other Services         Precautions/Restrictions    Precautions Precautions: Fall (due to R LE pain 10/10 this session with shooting pain worse in standing/stepping) Restrictions Weight Bearing Restrictions Per Provider Order: No     Mobility Bed Mobility               General bed mobility comments: Pt sitting in recliner at beginning and end of session    Transfers Overall transfer level: Needs assistance Equipment used: Rolling walker (2 wheels) Transfers: Sit to/from Stand, Bed to chair/wheelchair/BSC Sit to Stand: Contact guard assist     Step pivot transfers: Contact guard assist     General transfer comment: Assist for safety and lines; pt limitied by R LE pain      Balance Overall balance assessment: Mild deficits observed, not formally tested                                         ADL either performed or assessed with clinical judgement   ADL Overall ADL's : Needs assistance/impaired Eating/Feeding: Independent;Sitting   Grooming: Contact guard assist;Standing   Upper Body Bathing: Contact guard assist;Sitting   Lower Body Bathing: Moderate assistance;Cueing for compensatory techniques;Sitting/lateral leans;Sit to/from stand   Upper Body Dressing : Modified independent;Sitting   Lower Body Dressing: Moderate assistance;Sitting/lateral leans;Sit to/from stand;Cueing for compensatory techniques   Toilet Transfer: Contact guard assist;BSC/3in1;Rolling walker (2 wheels) (step-pivot) Toilet Transfer Details (indicate cue type and reason): simulated at [employer]- Clothing Manipulation and Hygiene: Set up;Sitting/lateral lean;Contact guard assist;Sit to/from stand       Functional mobility during ADLs: Contact guard assist;Rolling walker (2 wheels) General  ADL Comments: Pt functional level limited by decreased activity tolerance and pain.     Vision Baseline Vision/History: 0 No visual deficits Ability to See in Adequate Light: 0 Adequate Patient Visual Report: No change from  baseline       Perception         Praxis         Pertinent Vitals/Pain Pain Assessment Pain Assessment: 0-10 Pain Score: 10-Worst pain ever (8 in sitting 10 in standing/stepping) Pain Location: R LE worse in groin and knee; also reports chronic low back and R hip pain Pain Descriptors / Indicators: Burning, Shooting, Sharp, Grimacing, Guarding Pain Intervention(s): Limited activity within patient's tolerance, Monitored during session, Repositioned, Patient requesting pain meds-RN notified, Other (comment) (RN reportingin she is preparing pain medication for pt at end of session)     Extremity/Trunk Assessment Upper Extremity Assessment Upper Extremity Assessment: Right hand dominant;Overall [facility] for tasks assessed (R shoulder strength grossly 4/5; all other strength grossly 4+/5; B UE ROM and fine and gross motor coordination WNL)   Lower Extremity Assessment Lower Extremity Assessment: Defer to PT evaluation RLE Deficits / Details: limited by pain   Cervical / Trunk Assessment Cervical / Trunk Assessment: Other exceptions Cervical / Trunk Exceptions: pt reports chronic low back pain   Communication Communication Communication: No apparent difficulties   Cognition Arousal: Alert Behavior During Therapy: WFL for tasks assessed/performed Cognition: No apparent impairments             OT - Cognition Comments: AAOx4 and pleasant throughout session. Pt demonstrates ability to consistently follow multi-step commands and demonstrates good safety awareness and good insight into deficits.                 Following commands: Intact       Cueing  General Comments          Exercises     Shoulder Instructions      Home Living Family/patient expects to be discharged to:: Private residence Living Arrangements: Spouse/significant other Available Help at Discharge: Family;Available 24 hours/day Type of Home: House Home Access: Stairs to enter [employer] of Steps: 3 Entrance Stairs-Rails: Left Home Layout: One level     Bathroom Shower/Tub: Chief Strategy Officer: Standard     Home Equipment: None          Prior Functioning/Environment Prior Level of Function : Independent/Modified Independent;Driving             Mobility Comments: Independent without an AD ADLs Comments: Independent with ADLs, IADLs and drives. 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    OT Problem List: Decreased activity tolerance;Impaired balance (sitting and/or standing);Pain   OT Treatment/Interventions: Self-care/ADL training;Energy conservation;DME and/or AE instruction;Therapeutic activities;Patient/family education      OT Goals(Current goals can be found in the care plan section)   Acute Rehab OT Goals Patient Stated Goal: to have less pain, retrun home, and continue to be active in the community OT Goal Formulation: With patient Time For Goal Achievement: 10/29/23 Potential to Achieve Goals: Good ADL Goals Pt Will Perform Grooming: with modified independence;standing Pt Will Perform Lower Body Bathing: with modified independence;sitting/lateral leans;sit to/from stand (with adaptive equipment as needed) Pt Will Perform Lower Body Dressing: with modified independence;sitting/lateral leans;sit to/from stand (with adaptive equipment as needed) Pt Will Transfer to Toilet: with modified independence;regular height toilet;ambulating (with least restrictive AD) Pt  Will Perform Tub/Shower Transfer: Tub transfer;with modified independence;ambulating (with lease restrictive AD and DME as needed) Additional ADL Goal #1: Patient will demonstrate ability to Independently state 3 energy conservation strategies to increase safety and independence with functional tasks.   OT Frequency:  Min 1X/week    Co-evaluation               AM-PAC OT "6 Clicks" Daily Activity     Outcome Measure Help from another person eating meals?: None Help from another person taking care of personal grooming?: A Little Help from another person toileting, which includes using toliet, bedpan, or urinal?: A Little Help from another person bathing (including washing, rinsing, drying)?: A Lot Help from another person to put on and taking off regular upper body clothing?: None Help from another person to put on and taking off regular lower body clothing?: A Lot 6 Click Score: 18   End of Session Equipment Utilized During Treatment: Rolling walker (2 wheels) Nurse Communication: Mobility status;Patient requests pain meds;Other (comment) (OT POC)  Activity Tolerance: Patient tolerated treatment well;Patient limited by pain Patient left: in chair;with call bell/phone within reach;with nursing/sitter in room;with family/visitor present  OT Visit Diagnosis: Other abnormalities of gait and mobility (R26.89);Other (comment);Pain (decreased activity tolerance)                Time: 1002-1019 OT Time Calculation (min): 17 min Charges:  OT General Charges $OT Visit: 1 Visit OT Evaluation $OT Eval Low Complexity: 1 Low  Chirstopher Iovino "Kyle" M., OTR/L, MA Acute Rehab [phone]  Lendon Colonel 10/15/2023, 10:47 AM

## 2023-10-15 NOTE — Progress Notes (Addendum)
[facility] [place]  Subjective  - tearful and scared, pain at incision sites.   Objective 127/62 66 98.3 F (36.8 C) (Oral) 18 95%  Intake/Output Summary (Last 24 hours) at 10/15/2023 8469 Last data filed at 10/15/2023 0700 Gross per 24 hour  Intake 2030 ml  Output 2690 ml  Net -660 ml    Pain right groin and knee area surrounding surgical incision, no erythema or abnormal edema, no evidence of hematoma Lungs non labored breathing Doppler signals right PT/Peroneal and left PT/DP  Assessment/Planning: POD # 1  Procedure Performed: 1.  Extensive right common femoral endarterectomy including external iliac, profunda and superficial femoral arteries with bovine pericardial patch angioplasty 2.  Right common femoral to above-knee popliteal artery bypass graft with 6 mm ringed PTFE including right popliteal artery endarterectomy  Improved inflow with good doppler signals Pain control Norco for po pain and IV morphine for sever and breakthrough pain. PT/OT for mobility Cont ASA, Lipitor , and Plavix daily for medical management  Justin Wells 10/15/2023 8:06 AM --  Laboratory Lab Results: Recent Labs    10/14/23 1347  WBC 10.6*  HGB 11.9*  HCT 34.7*  PLT 174   BMET Recent Labs    10/14/23 1347  CREATININE 1.06    COAG Lab Results  Component Value Date   INR 1.1 10/09/2023   INR 1.10 05/08/2016   INR 1.10 04/12/2015   No results found for: "PTT"  I have independently interviewed and examined patient and agree with PA assessment and plan above.  Palpable right posterior tibial pulse and wounds healing well.  Plan to mobilize today and discharge in the coming days on dual antiplatelet therapy with statin.  Pain control is an issue due to preoperative medications.  Kent Riendeau C. Randie Heinz, MD [facility] [place] Office: [phone] Pager: [phone]

## 2023-10-15 NOTE — Progress Notes (Signed)
PHARMACIST LIPID MONITORING   Justin Wells is a 73 y.o. male admitted on 10/14/2023 with PVD.  Pharmacy has been consulted to optimize lipid-lowering therapy with the indication of secondary prevention for clinical ASCVD.  Recent Labs:  Lipid Panel (last 6 months):   No results found for: "CHOL", "TRIG", "HDL", "CHOLHDL", "VLDL", "LDLCALC", "LDLDIRECT"  Hepatic function panel (last 6 months):   Lab Results  Component Value Date   AST 21 10/09/2023   ALT 16 10/09/2023   ALKPHOS 98 10/09/2023   BILITOT 0.9 10/09/2023    SCr (since admission):   Serum creatinine: 1.06 mg/dL 16/10/96 0454 Estimated creatinine clearance: 75.8 mL/min  Current therapy and lipid therapy tolerance Current lipid-lowering therapy: Lipitor 80mg  qday Previous lipid-lowering therapies (if applicable):  Documented or reported allergies or intolerances to lipid-lowering therapies (if applicable):   Assessment:   Patient prefers no changes in lipid-lowering therapy at this time due to LDL already at goal<55  Plan:    1.Statin intensity (high intensity recommended for all patients regardless of the LDL):  No statin changes. The patient is already on a high intensity statin.  2.Add ezetimibe (if any one of the following):   Not indicated at this time.  3.Refer to lipid clinic:   No  4.Follow-up with:  Primary care provider - John Giovanni, MD  5.Follow-up labs after discharge:  No changes in lipid therapy, repeat a lipid panel in one year.      Ulyses Southward, PharmD, BCIDP, AAHIVP, CPP Infectious Disease Pharmacist 10/15/2023 9:33 AM

## 2023-10-16 MED ORDER — MORPHINE SULFATE ER 15 MG PO TBCR
15.0000 mg | EXTENDED_RELEASE_TABLET | Freq: Two times a day (BID) | ORAL | Status: DC
  Administered 2023-10-16 – 2023-10-17 (×3): 15 mg via ORAL
  Filled 2023-10-16 (×3): qty 1

## 2023-10-16 MED ORDER — OXYCODONE HCL 5 MG PO TABS
10.0000 mg | ORAL_TABLET | ORAL | Status: DC | PRN
  Administered 2023-10-16 – 2023-10-17 (×7): 10 mg via ORAL
  Filled 2023-10-16 (×8): qty 2

## 2023-10-16 NOTE — Progress Notes (Addendum)
[facility] [place]  Subjective  - He is in pain management   Objective 116/61 79 98.5 F (36.9 C) (Oral) 18 97%  Intake/Output Summary (Last 24 hours) at 10/16/2023 0981 Last data filed at 10/16/2023 0300 Gross per 24 hour  Intake 1040 ml  Output 1525 ml  Net -485 ml    Right groin soft, no hematoma Lungs non labored breathing Doppler signals right PT/Peroneal and left PT/DP Abdomin soft NTTP   Assessment/Planning: POD # 2 1.  Extensive right common femoral endarterectomy including external iliac, profunda and superficial femoral arteries with bovine pericardial patch angioplasty 2.  Right common femoral to above-knee popliteal artery bypass graft with 6 mm ringed PTFE including right popliteal artery endarterectomy  Improved blood flow  with good doppler signals Pain control issues.  I restarted his home pain medication MS Contin 15 q 12 and 10 Percoet q4   No PT/OT follow up recommended Pending mobility and pain control possible D/C today   Justin Wells 10/16/2023 7:14 AM --  Laboratory Lab Results: Recent Labs    10/14/23 1347 10/15/23 0750  WBC 10.6* 8.6  HGB 11.9* 12.0*  HCT 34.7* 35.5*  PLT 174 171   BMET Recent Labs    10/14/23 1347 10/15/23 0750  NA  --  137  K  --  3.5  CL  --  101  CO2  --  24  GLUCOSE  --  128*  BUN  --  10  CREATININE 1.06 1.00  CALCIUM  --  8.4*    COAG Lab Results  Component Value Date   INR 1.1 10/09/2023   INR 1.10 05/08/2016   INR 1.10 04/12/2015   No results found for: "PTT"  I have independently interviewed and examined patient and agree with PA assessment and plan above.  Palpable right posterior tibial pulse.  Expected postoperative edema and pain given preoperative medications.  Continue mobilization and pain control today and possibly home in the coming days.  Anabella Capshaw C. Randie Heinz, MD [facility] [place] Office: [phone] Pager:  [phone]

## 2023-10-16 NOTE — Progress Notes (Signed)
Physical Therapy Treatment Patient Details Name: Justin Wells MRN: [id number] DOB: 11/04/1950 Today's Date: 10/16/2023   History of Present Illness 73 yo male admitted 10/14/23 for RLE endarterectomy and BPG. PMH - CHF, CAD, multiple CVA with Rt sided weakness, HLD, HTN, PAD, and seizures.    PT Comments  Pt pleasant, joking and able to progress gait distance and perform stairs. Pt reports pain at knee and educated for HEP in supine and sitting. Pt reliant on RW due to pain but should progress to no AD as pain improves. Will continue to follow acutely with encouragement for mobility with staff.      If plan is discharge home, recommend the following: Help with stairs or ramp for entrance;Assistance with cooking/housework;A little help with bathing/dressing/bathroom   Can travel by private vehicle        Equipment Recommendations  Rolling walker (2 wheels)    Recommendations for Other Services       Precautions / Restrictions Precautions Precautions: Fall     Mobility  Bed Mobility Overal bed mobility: Needs Assistance Bed Mobility: Supine to Sit     Supine to sit: Min assist, HOB elevated     General bed mobility comments: HOB 25 degrees, min assist to move RLE and elevate trunk with increased time and cues    Transfers Overall transfer level: Needs assistance   Transfers: Sit to/from Stand Sit to Stand: Supervision           General transfer comment: cues for hand placement and safety    Ambulation/Gait Ambulation/Gait assistance: Contact guard assist Gait Distance (Feet): 400 Feet Assistive device: Rolling walker (2 wheels) Gait Pattern/deviations: Step-through pattern, Decreased stance time - right   Gait velocity interpretation: 1.31 - 2.62 ft/sec, indicative of limited community ambulator   General Gait Details: cues for posture and stride. Pt with steady gait with reliance on RW   Stairs Stairs: Yes Stairs assistance: Supervision Stair  Management: Step to pattern, Forwards, One rail Left Number of Stairs: 3 General stair comments: 3 stairs with rail on left and cues for sequence   Wheelchair Mobility     Tilt Bed    Modified Rankin (Stroke Patients Only)       Balance Overall balance assessment: Mild deficits observed, not formally tested                                          Communication Communication Communication: No apparent difficulties  Cognition Arousal: Alert Behavior During Therapy: WFL for tasks assessed/performed   PT - Cognitive impairments: No apparent impairments                         Following commands: Intact      Cueing Cueing Techniques: Verbal cues  Exercises General Exercises - Lower Extremity Long Arc Quad: AROM, Right, Seated, 20 reps Heel Slides: AAROM, Right, Supine, 10 reps Hip Flexion/Marching: AROM, Right, Seated, 20 reps    General Comments        Pertinent Vitals/Pain Pain Assessment Pain Score: 4  Pain Location: Rt knee and groin Pain Descriptors / Indicators: Aching, Sore Pain Intervention(s): Limited activity within patient's tolerance, Monitored during session, Premedicated before session, Repositioned    Home Living  Prior Function            PT Goals (current goals can now be found in the care plan section) Progress towards PT goals: Progressing toward goals    Frequency    Min 1X/week      PT Plan      Co-evaluation              AM-PAC PT "6 Clicks" Mobility   Outcome Measure  Help needed turning from your back to your side while in a flat bed without using bedrails?: A Little Help needed moving from lying on your back to sitting on the side of a flat bed without using bedrails?: A Little Help needed moving to and from a bed to a chair (including a wheelchair)?: A Little Help needed standing up from a chair using your arms (e.g., wheelchair or bedside chair)?: A  Little Help needed to walk in hospital room?: A Little Help needed climbing 3-5 steps with a railing? : A Little 6 Click Score: 18    End of Session   Activity Tolerance: Patient tolerated treatment well Patient left: in chair;with call bell/phone within reach Nurse Communication: Mobility status PT Visit Diagnosis: Other abnormalities of gait and mobility (R26.89);Pain Pain - Right/Left: Right Pain - part of body: Leg     Time: 1610-9604 PT Time Calculation (min) (ACUTE ONLY): 23 min  Charges:    $Gait Training: 8-22 mins $Therapeutic Exercise: 8-22 mins PT General Charges $$ ACUTE PT VISIT: 1 Visit                     Merryl Hacker, PT Acute Rehabilitation Services Office: [phone]    Justin Wells 10/16/2023, 12:18 PM

## 2023-10-16 NOTE — Anesthesia Postprocedure Evaluation (Signed)
Anesthesia Post Note  Patient: Justin Wells  Procedure(s) Performed: RIGHT COMMON FEMORAL ENDARTERECTOMY WITH PATCH ANGIOPLASTY (Right: Groin) BYPASS GRAFT FEMORAL-POPLITEAL ARTERY (Right: Leg Upper)     Patient location during evaluation: PACU Anesthesia Type: General Level of consciousness: awake and alert Pain management: pain level controlled Vital Signs Assessment: post-procedure vital signs reviewed and stable Respiratory status: spontaneous breathing, nonlabored ventilation, respiratory function stable and patient connected to nasal cannula oxygen Cardiovascular status: blood pressure returned to baseline and stable Postop Assessment: no apparent nausea or vomiting Anesthetic complications: no   There were no known notable events for this encounter.  Last Vitals:  Vitals:   10/16/23 0818 10/16/23 1149  BP: 123/69 (!) 97/58  Pulse: 80 81  Resp: 20 20  Temp: 36.9 C 36.9 C  SpO2: 97% 97%    Last Pain:  Vitals:   10/16/23 1149  TempSrc: Oral  PainSc: 7                  Ediberto Sens S

## 2023-10-17 NOTE — Care Management Important Message (Signed)
Important Message  Patient Details  Name: Justin Wells MRN: [id number] Date of Birth: [birth year]   Important Message Given:  Yes - Medicare IM     Renie Ora 10/17/2023, 11:08 AM

## 2023-10-17 NOTE — Progress Notes (Signed)
Mobility Specialist Progress Note:    10/17/23 1300  Mobility  Activity Ambulated with assistance in hallway  Level of Assistance Standby assist, set-up cues, supervision of patient - no hands on  Assistive Device Front wheel walker  Distance Ambulated (ft) 310 ft  Activity Response Tolerated well  Mobility Referral Yes  Mobility visit 1 Mobility  Mobility Specialist Start Time (ACUTE ONLY) 1300  Mobility Specialist Stop Time (ACUTE ONLY) 1315  Mobility Specialist Time Calculation (min) (ACUTE ONLY) 15 min   Pt received ambulating in room, eager to ambulate in hallway. SB with RW required for safety. Tolerated well, c/o LE pain. Asx throughout. Returned pt to room, RN at bedside, all needs met.    Feliciana Rossetti Mobility Specialist Please contact via Special educational needs teacher or  Rehab office at [phone]

## 2023-10-17 NOTE — TOC Transition Note (Signed)
Transition of Care [facility]) - Discharge Note Donn Pierini RN, BSN Transitions of Care Unit 4E- RN Case Manager See Treatment Team for direct phone #   Patient Details  Name: Justin Wells MRN: [id number] Date of Birth: Mar 22, 1951  Transition of Care [facility]) CM/SW Contact:  Darrold Span, RN Phone Number: 10/17/2023, 4:21 PM   Clinical Narrative:    Pt stable for transition home today, noted no recommendations for [facility] f/u, CM has notified [facility] liaison who was following w/ [facility] office referral for [facility] needs.   Pt confirmed need for RW as per recommendations- referral sent to [employer] for RW with request for delivery to bedside prior to discharge.   Family to transport home.     Final next level of care: Home/Self Care Barriers to Discharge: No Barriers Identified   Patient Goals and CMS Choice Patient states their goals for this hospitalization and ongoing recovery are:: return home   Choice offered to / list presented to : NA      Discharge Placement               Home        Discharge Plan and Services Additional resources added to the After Visit Summary for     Discharge Planning Services: CM Consult            DME Arranged: Dan Humphreys rolling DME Agency: [employer] Date DME Agency Contacted: 10/17/23 Time DME Agency Contacted: 1500 Representative spoke with at DME Agency: Vaughan Basta HH Arranged: NA HH Agency: [facility] ([facility])     Representative spoke with at [facility] Agency: Aggie Cosier  Social Drivers of Health (SDOH) Interventions SDOH Screenings   Food Insecurity: No Food Insecurity (10/15/2023)  Housing: Low Risk  (10/15/2023)  Transportation Needs: No Transportation Needs (10/15/2023)  Utilities: Not At Risk (10/15/2023)  Financial Resource Strain: Low Risk  (04/05/2020)   Received from [facility], [facility]  Physical Activity: Inactive (04/05/2020)   Received from [facility], [facility]  Social Connections:  Socially Integrated (10/15/2023)  Stress: No Stress Concern Present (04/05/2020)   Received from [facility], [facility]  Tobacco Use: Medium Risk (10/09/2023)  Health Literacy: Low Risk  (04/05/2020)   Received from [facility], [facility]     Readmission Risk Interventions    10/17/2023    4:21 PM  Readmission Risk Prevention Plan  Transportation Screening Complete  Home Care Screening Complete  Medication Review (RN CM) Complete

## 2023-10-17 NOTE — Progress Notes (Signed)
  Progress Note    10/17/2023 12:58 PM 3 Days Post-Op  Subjective: No overnight issues  Vitals:   10/17/23 0728 10/17/23 1219  BP: 124/81 93/61  Pulse: 63 95  Resp:  20  Temp: 98 F (36.7 C) 98.4 F (36.9 C)  SpO2: 91% 96%    Physical Exam: Awake alert oriented Nonlabored respirations Right lower extremity edema is stable with palpable right posterior tibial pulse  CBC    Component Value Date/Time   WBC 8.6 10/15/2023 0750   RBC 4.10 (L) 10/15/2023 0750   HGB 12.0 (L) 10/15/2023 0750   HCT 35.5 (L) 10/15/2023 0750   PLT 171 10/15/2023 0750   MCV 86.6 10/15/2023 0750   MCH 29.3 10/15/2023 0750   MCHC 33.8 10/15/2023 0750   RDW 17.2 (H) 10/15/2023 0750   LYMPHSABS 1.8 05/08/2016 0416   MONOABS 0.8 05/08/2016 0416   EOSABS 0.4 05/08/2016 0416   BASOSABS 0.1 05/08/2016 0416    BMET    Component Value Date/Time   NA 137 10/15/2023 0750   K 3.5 10/15/2023 0750   CL 101 10/15/2023 0750   CO2 24 10/15/2023 0750   GLUCOSE 128 (H) 10/15/2023 0750   BUN 10 10/15/2023 0750   CREATININE 1.00 10/15/2023 0750   CALCIUM 8.4 (L) 10/15/2023 0750   GFRNONAA >60 10/15/2023 0750   GFRAA >60 07/18/2016 1238    INR    Component Value Date/Time   INR 1.1 10/09/2023 1000     Intake/Output Summary (Last 24 hours) at 10/17/2023 1258 Last data filed at 10/17/2023 0800 Gross per 24 hour  Intake 712 ml  Output 500 ml  Net 212 ml     Assessment:  72 y.o. male is s/p right common femoral endarterectomy with patch angioplasty, bypass to the above-knee popliteal artery for rest pain now resolved.  Plan: Okay for discharge home   [place] C. Randie Heinz, MD [facility] Office: [phone] Pager: [phone]  10/17/2023 12:58 PM

## 2023-10-17 NOTE — Progress Notes (Signed)
Occupational Therapy Treatment Patient Details Name: Justin Wells MRN: [id number] DOB: 05-14-1951 Today's Date: 10/17/2023   History of present illness 73 yo male admitted 10/14/23 for RLE endarterectomy and BPG. PMH - CHF, CAD, multiple CVA with Rt sided weakness, HLD, HTN, PAD, and seizures.   OT comments  OT session focused on training in techniques for increased safety and independence with functional tasks, including training in energy conservation strategies with handouts provided. Pt currently demonstrates ability to complete UB ADLs Independent to Supervision, LB ADLs with Supervision to Min assist, and functional transfers/mobility with a RW with close Supervision to Contact guard assist. Pt's VSS on RA throughout session. Pt participated well and has made good progress toward goals. Pt has made adequate progress for discharge from an OT standpoint at this time. Plan for pt to discharge home today. If pt does not discharge as planned, pt will benefit from continued acute skilled OT services to continue to address deficits outlined below and increase safety and independence with functional tasks. No post-acute skilled OT services are indicated at this time.       If plan is discharge home, recommend the following:  A little help with walking and/or transfers;A little help with bathing/dressing/bathroom;Assistance with cooking/housework;Assist for transportation;Help with stairs or ramp for entrance   Equipment Recommendations  None recommended by OT    Recommendations for Other Services      Precautions / Restrictions Precautions Precautions: Fall Restrictions Weight Bearing Restrictions Per Provider Order: No       Mobility Bed Mobility               General bed mobility comments: Pt sitting in recliner at beginning and end of session.    Transfers Overall transfer level: Needs assistance Equipment used: Rolling walker (2 wheels) Transfers: Sit to/from Stand, Bed  to chair/wheelchair/BSC Sit to Stand: Supervision     Step pivot transfers: Supervision, Contact guard assist     General transfer comment: Largely close Sueprvision with cues and occasional CGA needed for increased safety     Balance Overall balance assessment: Mild deficits observed, not formally tested                                         ADL either performed or assessed with clinical judgement   ADL Overall ADL's : Needs assistance/impaired Eating/Feeding: Independent;Sitting   Grooming: Supervision/safety;Standing   Upper Body Bathing: Supervision/ safety;Sitting   Lower Body Bathing: Supervison/ safety;Contact guard assist;Sit to/from stand;Sitting/lateral leans;Cueing for safety   Upper Body Dressing : Modified independent;Sitting   Lower Body Dressing: Contact guard assist;Minimal assistance;With adaptive equipment;Cueing for safety;Cueing for compensatory techniques;Sitting/lateral leans;Sit to/from stand (with use of reacher to bring LE into leg of pants/underwear)   Toilet Transfer: Supervision/safety;Ambulation;Rolling walker (2 wheels);Regular Toilet;Grab bars;Cueing for safety Toilet Transfer Details (indicate cue type and reason): close Supervision for safety Toileting- Clothing Manipulation and Hygiene: Supervision/safety;Contact guard assist;Sit to/from stand;Cueing for safety       Functional mobility during ADLs: Supervision/safety;Contact guard assist;Rolling walker (2 wheels);Cueing for safety (Largely close Supervision with occasional CGA for increased safety) General ADL Comments: OT educated pt in energy conservation strategies for increased safety and independence with functional tasks and provided handouts with pt demonstrating understanding of education through teach back. Pt functional level continues to be affected by R knee pain. However, pt progress and current functional level adequate for discharge at  this time.     Extremity/Trunk Assessment Upper Extremity Assessment Upper Extremity Assessment: Right hand dominant;Overall [facility] for tasks assessed   Lower Extremity Assessment Lower Extremity Assessment: Defer to PT evaluation        Vision       Perception     Praxis     Communication Communication Communication: No apparent difficulties   Cognition Arousal: Alert Behavior During Therapy: WFL for tasks assessed/performed Cognition: No apparent impairments             OT - Cognition Comments: AAOx4 and pleasant throughout session. Pt demonstrates ability to consistently follow multi-step commands and demonstrates overall good safety awareness and good insight into deficits. Pt noted with mild short-term memory deficits this day, telling OT the same information multiple times.                 Following commands: Intact        Cueing   Cueing Techniques: Verbal cues  Exercises      Shoulder Instructions       General Comments VSS on RA throughout session.    Pertinent Vitals/ Pain       Pain Assessment Pain Assessment: Faces Faces Pain Scale: Hurts even more Pain Location: R knee Pain Descriptors / Indicators: Aching, Sore, Guarding Pain Intervention(s): Limited activity within patient's tolerance, Monitored during session, Repositioned  Home Living                                          Prior Functioning/Environment              Frequency  Min 1X/week        Progress Toward Goals  OT Goals(current goals can now be found in the care plan section)  Progress towards OT goals: Progressing toward goals  Acute Rehab OT Goals Patient Stated Goal: to have less pain in his R knee and to return home  Plan      Co-evaluation                 AM-PAC OT "6 Clicks" Daily Activity     Outcome Measure   Help from another person eating meals?: None Help from another person taking care of personal grooming?: A Little Help from  another person toileting, which includes using toliet, bedpan, or urinal?: A Little Help from another person bathing (including washing, rinsing, drying)?: A Little Help from another person to put on and taking off regular upper body clothing?: None Help from another person to put on and taking off regular lower body clothing?: A Little 6 Click Score: 20    End of Session Equipment Utilized During Treatment: Gait belt;Rolling walker (2 wheels)  OT Visit Diagnosis: Other abnormalities of gait and mobility (R26.89);Pain   Activity Tolerance Patient tolerated treatment well;Patient limited by pain   Patient Left in chair;with call bell/phone within reach   Nurse Communication Mobility status;Other (comment) (Pain level. Pt reporting occasional muscle cramps. Pt ready for discharge from OT standpoint.)        Time: 1610-9604 OT Time Calculation (min): 29 min  Charges: OT General Charges $OT Visit: 1 Visit OT Treatments $Self Care/Home Management : 23-37 mins  Justin Wells "Orson Eva., OTR/L, MA Acute Rehab [phone]   Lendon Colonel 10/17/2023, 1:35 PM

## 2023-10-20 NOTE — Discharge Summary (Signed)
[facility] Discharge Summary   Patient ID:  Justin Wells MRN: [id number] DOB/AGE: 09-May-1951 73 y.o.  Admit date: 10/14/2023 Discharge date: 10/17/23 Date of Surgery: 10/14/2023 Surgeon: Surgeon(s): Maeola Harman, MD  Admission Diagnosis: Atherosclerosis of native arteries of extremity with rest pain [facility]) [I70.229] Rest pain of lower extremity due to atherosclerosis [facility]) [I70.229]  Discharge Diagnoses:  Atherosclerosis of native arteries of extremity with rest pain (HCC) [I70.229] Rest pain of lower extremity due to atherosclerosis (HCC) [I70.229]  Secondary Diagnoses: Past Medical History:  Diagnosis Date   Anxiety    Arthritis    "hands; back; legs" (04/18/2016)   Barrett's esophagus    Cerebrovascular disease    CHF (congestive heart failure) (HCC)    "said I had this a few years ago; it went away" (04/18/2016)   Chronic back pain    Coronary artery disease    Depression    Gastroesophageal reflux    H/O hiatal hernia    Headache(784.0)    "probably monthly" (01/27/2013)   Hyperlipidemia    Hypertension    Peripheral arterial disease (HCC)    Seizures (HCC)    "daily the last 5 days" (04/18/2016)   Stroke [facility]) 2009  2010   "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, weak right leg and hand; I've had 2 or 3 strokes; can't remember the dates" (01/27/2013)    Procedure(s): RIGHT COMMON FEMORAL ENDARTERECTOMY WITH PATCH ANGIOPLASTY BYPASS GRAFT FEMORAL-POPLITEAL ARTERY  Discharged Condition: good  HPI: 73 year old male with history of multiple lower extremity procedures with occluded stents of the right SFA and very short distance claudication and rest pain. He has undergone angiography which demonstrates significant calcified right common femoral disease he also has occluded SFA and reconstitutes above the knee SFA although he does have disease below the knee as well and we have planned for bypass to the above-knee with PTFE versus  below the knee with vein.    Hospital Course:  Justin Wells is a 73 y.o. male is S/P  Procedure(s): RIGHT COMMON FEMORAL ENDARTERECTOMY WITH PATCH ANGIOPLASTY BYPASS GRAFT FEMORAL-POPLITEAL ARTERY Improved inflow with good doppler signals Pain control Norco for po pain and IV morphine for sever and breakthrough pain. PT/OT for mobility Cont ASA, Lipitor , and Plavix daily for medical management  He has chronic pain issues.  He was restarted on his home medications to improve pain control.  MS Contin 15 q 12 and 10 Percoet q4.  Discharged in stable condition POD # 3.  No new pain medication was given.  No PT/OT HH was recommended.  F/U with our office in 2 weeks was arranged.   Significant Diagnostic Studies: CBC Lab Results  Component Value Date   WBC 8.6 10/15/2023   HGB 12.0 (L) 10/15/2023   HCT 35.5 (L) 10/15/2023   MCV 86.6 10/15/2023   PLT 171 10/15/2023    BMET    Component Value Date/Time   NA 137 10/15/2023 0750   K 3.5 10/15/2023 0750   CL 101 10/15/2023 0750   CO2 24 10/15/2023 0750   GLUCOSE 128 (H) 10/15/2023 0750   BUN 10 10/15/2023 0750   CREATININE 1.00 10/15/2023 0750   CALCIUM 8.4 (L) 10/15/2023 0750   GFRNONAA >60 10/15/2023 0750   GFRAA >60 07/18/2016 1238   COAG Lab Results  Component Value Date   INR 1.1 10/09/2023   INR 1.10 05/08/2016   INR 1.10 04/12/2015     Disposition:  Discharge to :Home Discharge Instructions  Call MD for:  redness, tenderness, or signs of infection (pain, swelling, bleeding, redness, odor or green/yellow discharge around incision site)   Complete by: As directed    Call MD for:  severe or increased pain, loss or decreased feeling  in affected limb(s)   Complete by: As directed    Call MD for:  temperature >100.5   Complete by: As directed    Discharge patient   Complete by: As directed    Discharge disposition: 01-Home or Self Care   Discharge patient date: 10/17/2023   Increase activity slowly    Complete by: As directed    Walk with assistance use walker or cane as needed   May shower    Complete by: As directed    Resume previous diet   Complete by: As directed       Allergies as of 10/17/2023   No Known Allergies      Medication List     TAKE these medications    acetaminophen 500 MG tablet Commonly known as: TYLENOL Take 1,000 mg by mouth every 6 (six) hours as needed for moderate pain.   aspirin EC 81 MG tablet Take 81 mg by mouth daily. Swallow whole.   atorvastatin 80 MG tablet Commonly known as: LIPITOR Take 80 mg by mouth at bedtime.   clopidogrel 75 MG tablet Commonly known as: PLAVIX Take 1 tablet by mouth once daily   DULoxetine 60 MG capsule Commonly known as: CYMBALTA Take 60 mg by mouth at bedtime.   esomeprazole 40 MG capsule Commonly known as: NEXIUM Take 40 mg by mouth in the morning and at bedtime.   finasteride 5 MG tablet Commonly known as: PROSCAR Take 5 mg by mouth in the morning.   fluticasone 50 MCG/ACT nasal spray Commonly known as: FLONASE Place 2 sprays into both nostrils in the morning and at bedtime.   gabapentin 100 MG capsule Commonly known as: NEURONTIN Take 100 mg by mouth 2 (two) times daily.   isosorbide mononitrate 60 MG 24 hr tablet Commonly known as: IMDUR Take 60 mg by mouth every morning.   levETIRAcetam 750 MG tablet Commonly known as: KEPPRA Take 1 tablet (750 mg total) by mouth 2 (two) times daily. Take 1.5 tablets twice daily What changed: additional instructions   loratadine 10 MG tablet Commonly known as: CLARITIN Take 10 mg by mouth in the morning.   metoprolol succinate 25 MG 24 hr tablet Commonly known as: TOPROL-XL Take 25 mg by mouth in the morning.   mirtazapine 15 MG tablet Commonly known as: REMERON Take 15 mg by mouth at bedtime.   morphine 15 MG 12 hr tablet Commonly known as: MS CONTIN Take 15 mg by mouth 2 (two) times daily.   nitroGLYCERIN 0.4 MG SL tablet Commonly known  as: NITROSTAT Place 0.4 mg under the tongue every 5 (five) minutes as needed for chest pain.   Oxycodone HCl 10 MG Tabs Take 10 mg by mouth every 4 (four) hours.   polyethylene glycol 17 g packet Commonly known as: MIRALAX / GLYCOLAX Take 17 g by mouth daily as needed for moderate constipation.   tamsulosin 0.4 MG Caps capsule Commonly known as: FLOMAX Take 0.4 mg by mouth in the morning.   valsartan 40 MG tablet Commonly known as: DIOVAN Take 40 mg by mouth in the morning.   VISINE OP Place 1 drop into both eyes daily as needed (dry eyes).       Verbal and written Discharge instructions  given to the patient. Wound care per Discharge AVS  Follow-up Information     Maeola Harman, MD Follow up in 2 week(s).   Specialties: Vascular Surgery, Cardiology Why: Office will call you to arrange your appt (sent) Contact information: [address] [place] [address] [phone]         [employer] Follow up.   Why: rolling walker arranged- to be delivered to room prior to discharge                Signed: Mosetta Pigeon 10/20/2023, 8:55 AM - For VQI Registry use --- Instructions: Press F2 to tab through selections.  Delete question if not applicable.   Post-op:  Wound infection: No  Graft infection: No  Transfusion: No  If yes, 0 units given New Arrhythmia: No Ipsilateral amputation: [ x] no, [ ]  Minor, [ ]  BKA, [ ]  AKA Discharge patency: [ x] Primary, [ ]  Primary assisted, [ ]  Secondary, [ ]  Occluded Patency judged by: [x ] Dopper only, [ ]  Palpable graft pulse, [ ]  Palpable distal pulse, [ ]  ABI inc. > 0.15, [ ]  Duplex  D/C Ambulatory Status: Ambulatory with Assistance  Complications: MI: [x ] No, [ ]  Troponin only, [ ]  EKG or Clinical CHF: No Resp failure: [x ] none, [ ]  Pneumonia, [ ]  Ventilator Chg in renal function: [x ] none, [ ]  Inc. Cr > 0.5, [ ]  Temp. Dialysis, [ ]  Permanent dialysis Stroke: [ x] None, [ ]  Minor, [ ]  Major Return to  OR: No  Reason for return to OR: [ ]  Bleeding, [ ]  Infection, [ ]  Thrombosis, [ ]  Revision  Discharge medications: Statin use:  Yes ASA use:  Yes Plavix use:  Yes Beta blocker use: Yes Coumadin use: No  for medical reason not indicated

## 2023-10-22 ENCOUNTER — Inpatient Hospital Stay (HOSPITAL_COMMUNITY)
Admission: AD | Admit: 2023-10-22 | Discharge: 2023-10-31 | DRG: 921 | Disposition: A | Discharge status: Home Health | Payer: Medicare HMO | Source: Ambulatory Visit | Attending: Vascular Surgery | Admitting: Vascular Surgery

## 2023-10-22 ENCOUNTER — Ambulatory Visit (INDEPENDENT_AMBULATORY_CARE_PROVIDER_SITE_OTHER): Payer: Medicare HMO | Admitting: Physician Assistant

## 2023-10-22 ENCOUNTER — Other Ambulatory Visit: Payer: Self-pay

## 2023-10-22 ENCOUNTER — Telehealth: Payer: Self-pay

## 2023-10-22 ENCOUNTER — Encounter (HOSPITAL_COMMUNITY): Payer: Self-pay

## 2023-10-22 VITALS — BP 113/71 | HR 63 | Temp 98.1°F | Ht 69.0 in | Wt 210.0 lb

## 2023-10-22 DIAGNOSIS — B9620 Unspecified Escherichia coli [E. coli] as the cause of diseases classified elsewhere: Secondary | ICD-10-CM | POA: Diagnosis present

## 2023-10-22 DIAGNOSIS — I10 Essential (primary) hypertension: Secondary | ICD-10-CM | POA: Diagnosis present

## 2023-10-22 DIAGNOSIS — T8149XA Infection following a procedure, other surgical site, initial encounter: Secondary | ICD-10-CM | POA: Diagnosis not present

## 2023-10-22 DIAGNOSIS — I69351 Hemiplegia and hemiparesis following cerebral infarction affecting right dominant side: Secondary | ICD-10-CM

## 2023-10-22 DIAGNOSIS — Z7902 Long term (current) use of antithrombotics/antiplatelets: Secondary | ICD-10-CM

## 2023-10-22 DIAGNOSIS — T8140XA Infection following a procedure, unspecified, initial encounter: Secondary | ICD-10-CM

## 2023-10-22 DIAGNOSIS — I2510 Atherosclerotic heart disease of native coronary artery without angina pectoris: Secondary | ICD-10-CM | POA: Diagnosis present

## 2023-10-22 DIAGNOSIS — I97638 Postprocedural hematoma of a circulatory system organ or structure following other circulatory system procedure: Secondary | ICD-10-CM | POA: Diagnosis present

## 2023-10-22 DIAGNOSIS — Z79899 Other long term (current) drug therapy: Secondary | ICD-10-CM | POA: Diagnosis not present

## 2023-10-22 DIAGNOSIS — L03314 Cellulitis of groin: Secondary | ICD-10-CM | POA: Diagnosis not present

## 2023-10-22 DIAGNOSIS — F32A Depression, unspecified: Secondary | ICD-10-CM | POA: Diagnosis present

## 2023-10-22 DIAGNOSIS — J449 Chronic obstructive pulmonary disease, unspecified: Secondary | ICD-10-CM | POA: Diagnosis not present

## 2023-10-22 DIAGNOSIS — Z7982 Long term (current) use of aspirin: Secondary | ICD-10-CM | POA: Diagnosis not present

## 2023-10-22 DIAGNOSIS — F419 Anxiety disorder, unspecified: Secondary | ICD-10-CM | POA: Diagnosis present

## 2023-10-22 DIAGNOSIS — Y713 Surgical instruments, materials and cardiovascular devices (including sutures) associated with adverse incidents: Secondary | ICD-10-CM | POA: Diagnosis present

## 2023-10-22 DIAGNOSIS — K449 Diaphragmatic hernia without obstruction or gangrene: Secondary | ICD-10-CM | POA: Diagnosis present

## 2023-10-22 DIAGNOSIS — T8141XA Infection following a procedure, superficial incisional surgical site, initial encounter: Principal | ICD-10-CM | POA: Diagnosis present

## 2023-10-22 DIAGNOSIS — G40909 Epilepsy, unspecified, not intractable, without status epilepticus: Secondary | ICD-10-CM | POA: Diagnosis present

## 2023-10-22 DIAGNOSIS — I97648 Postprocedural seroma of a circulatory system organ or structure following other circulatory system procedure: Secondary | ICD-10-CM | POA: Diagnosis present

## 2023-10-22 DIAGNOSIS — Y838 Other surgical procedures as the cause of abnormal reaction of the patient, or of later complication, without mention of misadventure at the time of the procedure: Secondary | ICD-10-CM | POA: Diagnosis present

## 2023-10-22 DIAGNOSIS — Z87891 Personal history of nicotine dependence: Secondary | ICD-10-CM | POA: Diagnosis not present

## 2023-10-22 DIAGNOSIS — T827XXA Infection and inflammatory reaction due to other cardiac and vascular devices, implants and grafts, initial encounter: Principal | ICD-10-CM | POA: Diagnosis present

## 2023-10-22 DIAGNOSIS — I739 Peripheral vascular disease, unspecified: Secondary | ICD-10-CM | POA: Diagnosis present

## 2023-10-22 DIAGNOSIS — K219 Gastro-esophageal reflux disease without esophagitis: Secondary | ICD-10-CM | POA: Diagnosis present

## 2023-10-22 DIAGNOSIS — S8001XA Contusion of right knee, initial encounter: Secondary | ICD-10-CM | POA: Diagnosis not present

## 2023-10-22 DIAGNOSIS — F418 Other specified anxiety disorders: Secondary | ICD-10-CM | POA: Diagnosis not present

## 2023-10-22 DIAGNOSIS — I9789 Other postprocedural complications and disorders of the circulatory system, not elsewhere classified: Secondary | ICD-10-CM | POA: Diagnosis not present

## 2023-10-22 LAB — COMPREHENSIVE METABOLIC PANEL
ALT: 26 U/L (ref 0–44)
AST: 33 U/L (ref 15–41)
Albumin: 2.5 g/dL — ABNORMAL LOW (ref 3.5–5.0)
Alkaline Phosphatase: 65 U/L (ref 38–126)
Anion gap: 11 (ref 5–15)
BUN: 23 mg/dL (ref 8–23)
CO2: 28 mmol/L (ref 22–32)
Calcium: 8.5 mg/dL — ABNORMAL LOW (ref 8.9–10.3)
Chloride: 99 mmol/L (ref 98–111)
Creatinine, Ser: 1.12 mg/dL (ref 0.61–1.24)
GFR, Estimated: >60 mL/min (ref >60)
Glucose, Bld: 145 mg/dL — ABNORMAL HIGH (ref 70–99)
Potassium: 4.2 mmol/L (ref 3.5–5.1)
Sodium: 138 mmol/L (ref 135–145)
Total Bilirubin: 0.9 mg/dL (ref 0.0–1.2)
Total Protein: 5.8 g/dL — ABNORMAL LOW (ref 6.5–8.1)

## 2023-10-22 LAB — CBC
HCT: 29.2 % — ABNORMAL LOW (ref 39.0–52.0)
Hemoglobin: 10 g/dL — ABNORMAL LOW (ref 13.0–17.0)
MCH: 29.4 pg (ref 26.0–34.0)
MCHC: 34.2 g/dL (ref 30.0–36.0)
MCV: 85.9 fL (ref 80.0–100.0)
Platelets: 303 10*3/uL (ref 150–400)
RBC: 3.4 MIL/uL — ABNORMAL LOW (ref 4.22–5.81)
RDW: 16.6 % — ABNORMAL HIGH (ref 11.5–15.5)
WBC: 8.1 10*3/uL (ref 4.0–10.5)
nRBC: 0 % (ref 0.0–0.2)

## 2023-10-22 LAB — PROTIME-INR
INR: 1.2 (ref 0.8–1.2)
Prothrombin Time: 15.6 s — ABNORMAL HIGH (ref 11.4–15.2)

## 2023-10-22 MED ORDER — LABETALOL HCL 5 MG/ML IV SOLN: 10.0000 mg | INTRAVENOUS | Status: DC | PRN

## 2023-10-22 MED ORDER — PIPERACILLIN-TAZOBACTAM 3.375 G IVPB
3.3750 g | Freq: Three times a day (TID) | INTRAVENOUS | Status: DC
  Administered 2023-10-22 – 2023-10-28 (×17): 3.375 g via INTRAVENOUS
  Filled 2023-10-22 (×17): qty 50

## 2023-10-22 MED ORDER — PIPERACILLIN-TAZOBACTAM 3.375 G IVPB 30 MIN
3.3750 g | Freq: Once | INTRAVENOUS | Status: AC
  Administered 2023-10-22: 3.375 g via INTRAVENOUS
  Filled 2023-10-22: qty 50

## 2023-10-22 MED ORDER — MORPHINE SULFATE (PF) 2 MG/ML IV SOLN
2.0000 mg | INTRAVENOUS | Status: DC | PRN
  Administered 2023-10-22 – 2023-10-29 (×15): 2 mg via INTRAVENOUS
  Filled 2023-10-22 (×15): qty 1

## 2023-10-22 MED ORDER — GABAPENTIN 100 MG PO CAPS
100.0000 mg | ORAL_CAPSULE | Freq: Every day | ORAL | Status: DC
  Administered 2023-10-24 – 2023-10-31 (×8): 100 mg via ORAL
  Filled 2023-10-22 (×8): qty 1

## 2023-10-22 MED ORDER — IRBESARTAN 75 MG PO TABS
37.5000 mg | ORAL_TABLET | Freq: Every day | ORAL | Status: DC
  Administered 2023-10-24 – 2023-10-31 (×8): 37.5 mg via ORAL
  Filled 2023-10-22 (×9): qty 0.5

## 2023-10-22 MED ORDER — NAPHAZOLINE-PHENIRAMINE 0.025-0.3 % OP SOLN: 2.0000 [drp] | Freq: Every day | OPHTHALMIC | Status: DC | PRN

## 2023-10-22 MED ORDER — VANCOMYCIN HCL IN DEXTROSE 1-5 GM/200ML-% IV SOLN
1000.0000 mg | Freq: Once | INTRAVENOUS | Status: AC
  Administered 2023-10-22: 1000 mg via INTRAVENOUS

## 2023-10-22 MED ORDER — GUAIFENESIN-DM 100-10 MG/5ML PO SYRP: 15.0000 mL | ORAL_SOLUTION | ORAL | Status: DC | PRN

## 2023-10-22 MED ORDER — MIRTAZAPINE 15 MG PO TABS
15.0000 mg | ORAL_TABLET | Freq: Every day | ORAL | Status: DC
  Administered 2023-10-22 – 2023-10-30 (×9): 15 mg via ORAL
  Filled 2023-10-22 (×9): qty 1

## 2023-10-22 MED ORDER — ONDANSETRON HCL 4 MG/2ML IJ SOLN
4.0000 mg | Freq: Four times a day (QID) | INTRAMUSCULAR | Status: DC | PRN
  Administered 2023-10-22: 4 mg via INTRAVENOUS
  Filled 2023-10-22: qty 2

## 2023-10-22 MED ORDER — ATORVASTATIN CALCIUM 80 MG PO TABS
80.0000 mg | ORAL_TABLET | Freq: Every day | ORAL | Status: DC
  Administered 2023-10-22 – 2023-10-30 (×9): 80 mg via ORAL
  Filled 2023-10-22 (×9): qty 1

## 2023-10-22 MED ORDER — ASPIRIN 81 MG PO TBEC
81.0000 mg | DELAYED_RELEASE_TABLET | Freq: Every day | ORAL | Status: DC
  Administered 2023-10-22 – 2023-10-30 (×9): 81 mg via ORAL
  Filled 2023-10-22 (×9): qty 1

## 2023-10-22 MED ORDER — HYDRALAZINE HCL 20 MG/ML IJ SOLN: 5.0000 mg | INTRAMUSCULAR | Status: DC | PRN

## 2023-10-22 MED ORDER — SODIUM CHLORIDE 0.9 % IV SOLN: INTRAVENOUS | Status: AC

## 2023-10-22 MED ORDER — ISOSORBIDE MONONITRATE ER 60 MG PO TB24
60.0000 mg | ORAL_TABLET | Freq: Every morning | ORAL | Status: DC
  Administered 2023-10-24 – 2023-10-31 (×8): 60 mg via ORAL
  Filled 2023-10-22 (×8): qty 1

## 2023-10-22 MED ORDER — CEFAZOLIN SODIUM-DEXTROSE 2-4 GM/100ML-% IV SOLN
2.0000 g | INTRAVENOUS | Status: AC
  Administered 2023-10-23: 2 g via INTRAVENOUS
  Filled 2023-10-22: qty 100

## 2023-10-22 MED ORDER — POLYETHYLENE GLYCOL 3350 17 G PO PACK: 17.0000 g | PACK | Freq: Every day | ORAL | Status: DC | PRN

## 2023-10-22 MED ORDER — METOPROLOL SUCCINATE ER 25 MG PO TB24
25.0000 mg | ORAL_TABLET | Freq: Every day | ORAL | Status: DC
  Administered 2023-10-23 – 2023-10-31 (×9): 25 mg via ORAL
  Filled 2023-10-22 (×9): qty 1

## 2023-10-22 MED ORDER — POTASSIUM CHLORIDE CRYS ER 20 MEQ PO TBCR
20.0000 meq | EXTENDED_RELEASE_TABLET | Freq: Once | ORAL | Status: AC
  Administered 2023-10-22: 20 meq via ORAL
  Filled 2023-10-22: qty 1

## 2023-10-22 MED ORDER — OXYCODONE HCL 5 MG PO TABS
10.0000 mg | ORAL_TABLET | ORAL | Status: DC
  Administered 2023-10-22 – 2023-10-31 (×50): 10 mg via ORAL
  Filled 2023-10-22 (×51): qty 2

## 2023-10-22 MED ORDER — DULOXETINE HCL 60 MG PO CPEP
60.0000 mg | ORAL_CAPSULE | Freq: Every day | ORAL | Status: DC
  Administered 2023-10-22 – 2023-10-30 (×9): 60 mg via ORAL
  Filled 2023-10-22 (×9): qty 1

## 2023-10-22 MED ORDER — METOPROLOL TARTRATE 5 MG/5ML IV SOLN
2.0000 mg | INTRAVENOUS | Status: DC | PRN
Start: 2023-10-22 — End: 2023-10-31

## 2023-10-22 MED ORDER — FINASTERIDE 5 MG PO TABS
5.0000 mg | ORAL_TABLET | Freq: Every day | ORAL | Status: DC
  Administered 2023-10-23 – 2023-10-31 (×9): 5 mg via ORAL
  Filled 2023-10-22 (×9): qty 1

## 2023-10-22 MED ORDER — TAMSULOSIN HCL 0.4 MG PO CAPS
0.4000 mg | ORAL_CAPSULE | Freq: Every day | ORAL | Status: DC
  Administered 2023-10-23 – 2023-10-31 (×9): 0.4 mg via ORAL
  Filled 2023-10-22 (×9): qty 1

## 2023-10-22 MED ORDER — SODIUM CHLORIDE 0.9 % IV SOLN: INTRAVENOUS | Status: DC

## 2023-10-22 MED ORDER — MORPHINE SULFATE ER 15 MG PO TBCR
15.0000 mg | EXTENDED_RELEASE_TABLET | Freq: Two times a day (BID) | ORAL | Status: DC
  Administered 2023-10-22 – 2023-10-31 (×18): 15 mg via ORAL
  Filled 2023-10-22 (×18): qty 1

## 2023-10-22 MED ORDER — LORATADINE 10 MG PO TABS: 10.0000 mg | ORAL_TABLET | Freq: Every day | ORAL | Status: DC | PRN

## 2023-10-22 MED ORDER — PHENOL 1.4 % MT LIQD: 1.0000 | OROMUCOSAL | Status: DC | PRN

## 2023-10-22 MED ORDER — VANCOMYCIN HCL IN DEXTROSE 1-5 GM/200ML-% IV SOLN: 1000.0000 mg | Freq: Two times a day (BID) | INTRAVENOUS | Status: DC

## 2023-10-22 MED ORDER — LEVETIRACETAM 750 MG PO TABS
750.0000 mg | ORAL_TABLET | Freq: Two times a day (BID) | ORAL | Status: DC
  Administered 2023-10-22 – 2023-10-31 (×17): 750 mg via ORAL
  Filled 2023-10-22 (×18): qty 1

## 2023-10-22 MED ORDER — ACETAMINOPHEN 325 MG PO TABS
325.0000 mg | ORAL_TABLET | ORAL | Status: DC | PRN
  Administered 2023-10-22 – 2023-10-30 (×4): 650 mg via ORAL
  Filled 2023-10-22 (×4): qty 2

## 2023-10-22 MED ORDER — CHLORHEXIDINE GLUCONATE 4 % EX SOLN
60.0000 mL | Freq: Once | CUTANEOUS | Status: AC
  Administered 2023-10-22: 4 via TOPICAL
  Filled 2023-10-22: qty 15

## 2023-10-22 MED ORDER — CLOPIDOGREL BISULFATE 75 MG PO TABS
75.0000 mg | ORAL_TABLET | Freq: Every day | ORAL | Status: DC
  Administered 2023-10-22 – 2023-10-31 (×10): 75 mg via ORAL
  Filled 2023-10-22 (×10): qty 1

## 2023-10-22 MED ORDER — VANCOMYCIN HCL 750 MG/150ML IV SOLN
750.0000 mg | Freq: Two times a day (BID) | INTRAVENOUS | Status: DC
  Administered 2023-10-23 – 2023-10-27 (×9): 750 mg via INTRAVENOUS
  Filled 2023-10-22 (×9): qty 150

## 2023-10-22 MED ORDER — GABAPENTIN 100 MG PO CAPS
200.0000 mg | ORAL_CAPSULE | Freq: Every day | ORAL | Status: DC
  Administered 2023-10-22 – 2023-10-30 (×9): 200 mg via ORAL
  Filled 2023-10-22 (×9): qty 2

## 2023-10-22 MED ORDER — ACETAMINOPHEN 650 MG RE SUPP: 325.0000 mg | RECTAL | Status: DC | PRN

## 2023-10-22 MED ORDER — VANCOMYCIN HCL IN DEXTROSE 1-5 GM/200ML-% IV SOLN
1000.0000 mg | Freq: Once | INTRAVENOUS | Status: DC
  Filled 2023-10-22: qty 200

## 2023-10-22 MED ORDER — OXYCODONE HCL 5 MG PO TABS
5.0000 mg | ORAL_TABLET | ORAL | Status: DC | PRN
  Administered 2023-10-22: 10 mg via ORAL
  Administered 2023-10-22: 5 mg via ORAL
  Administered 2023-10-23 (×2): 10 mg via ORAL
  Filled 2023-10-22: qty 1
  Filled 2023-10-22 (×2): qty 2

## 2023-10-22 MED ORDER — PANTOPRAZOLE SODIUM 40 MG PO TBEC: 40.0000 mg | DELAYED_RELEASE_TABLET | Freq: Every day | ORAL | Status: DC

## 2023-10-22 MED ORDER — ALUM & MAG HYDROXIDE-SIMETH 200-200-20 MG/5ML PO SUSP: 15.0000 mL | ORAL | Status: DC | PRN

## 2023-10-22 MED ORDER — PANTOPRAZOLE SODIUM 40 MG PO TBEC
40.0000 mg | DELAYED_RELEASE_TABLET | Freq: Every day | ORAL | Status: DC
  Administered 2023-10-24 – 2023-10-31 (×8): 40 mg via ORAL
  Filled 2023-10-22 (×8): qty 1

## 2023-10-22 MED ORDER — GABAPENTIN 100 MG PO CAPS: 100.0000 mg | ORAL_CAPSULE | ORAL | Status: DC

## 2023-10-22 MED ORDER — CHLORHEXIDINE GLUCONATE 4 % EX SOLN
60.0000 mL | Freq: Once | CUTANEOUS | Status: AC
  Administered 2023-10-23: 4 via TOPICAL
  Filled 2023-10-22: qty 45

## 2023-10-22 MED ORDER — NITROGLYCERIN 0.4 MG SL SUBL: 0.4000 mg | SUBLINGUAL_TABLET | SUBLINGUAL | Status: DC | PRN

## 2023-10-22 NOTE — H&P (Signed)
HPI:  Justin Wells is a 73 y.o. male who presents today as a triage visit.  He recently underwent extensive right common femoral, external iliac, profundofemoral, and superficial femoral artery endarterectomy with bovine pericardial patch angioplasty, plus right common femoral to above-knee popliteal artery bypass with ringed PTFE.  This was done for critical limb ischemia with rest pain.   He returns today as a triage visit.  He states starting on Sunday his right groin incision became swollen, red, and tender.  Over the past couple of days his redness has worsened and pain has increased.  He started having drainage from the incision yesterday.  He also had fevers last night and this morning.  His right leg has also been fairly swollen since surgery, causing weeping.  He says his right foot still hurts a lot.     Allergies  No Known Allergies           Current Outpatient Medications  Medication Sig Dispense Refill   acetaminophen (TYLENOL) 500 MG tablet Take 1,000 mg by mouth every 6 (six) hours as needed for moderate pain.       aspirin EC 81 MG tablet Take 81 mg by mouth daily. Swallow whole.       atorvastatin (LIPITOR) 80 MG tablet Take 80 mg by mouth at bedtime.       clopidogrel (PLAVIX) 75 MG tablet Take 1 tablet by mouth once daily 90 tablet 3   DULoxetine (CYMBALTA) 60 MG capsule Take 60 mg by mouth at bedtime.       esomeprazole (NEXIUM) 40 MG capsule Take 40 mg by mouth in the morning and at bedtime.       finasteride (PROSCAR) 5 MG tablet Take 5 mg by mouth in the morning.       fluticasone (FLONASE) 50 MCG/ACT nasal spray Place 2 sprays into both nostrils in the morning and at bedtime.       gabapentin (NEURONTIN) 100 MG capsule Take 100 mg by mouth 2 (two) times daily.       isosorbide mononitrate (IMDUR) 60 MG 24 hr tablet Take 60 mg by mouth every morning.       levETIRAcetam (KEPPRA) 750 MG tablet Take 1 tablet (750 mg total) by mouth 2 (two) times daily. Take 1.5  tablets twice daily (Patient taking differently: Take 750 mg by mouth 2 (two) times daily.) 60 tablet 1   loratadine (CLARITIN) 10 MG tablet Take 10 mg by mouth in the morning.       metoprolol succinate (TOPROL-XL) 25 MG 24 hr tablet Take 25 mg by mouth in the morning.       mirtazapine (REMERON) 15 MG tablet Take 15 mg by mouth at bedtime.       morphine (MS CONTIN) 15 MG 12 hr tablet Take 15 mg by mouth 2 (two) times daily.       nitroGLYCERIN (NITROSTAT) 0.4 MG SL tablet Place 0.4 mg under the tongue every 5 (five) minutes as needed for chest pain.       Oxycodone HCl 10 MG TABS Take 10 mg by mouth every 4 (four) hours.       polyethylene glycol (MIRALAX / GLYCOLAX) 17 g packet Take 17 g by mouth daily as needed for moderate constipation.       tamsulosin (FLOMAX) 0.4 MG CAPS capsule Take 0.4 mg by mouth in the morning.       Tetrahydrozoline HCl (VISINE OP) Place 1 drop into both eyes daily  as needed (dry eyes).       valsartan (DIOVAN) 40 MG tablet Take 40 mg by mouth in the morning.          No current facility-administered medications for this visit.         ROS:  See HPI   Physical Exam:   Vitals:   10/22/23 1229  BP: 129/64  Pulse: 63  Resp: 15  Temp: 97.7 F (36.5 C)  SpO2: 99%      Incision: Right groin incision mostly intact with distal, superficial dehiscence.  Significant swelling and redness to the right groin.  Right popliteal incision healing appropriately with surrounding swelling Extremities: Extensive right lower extremity edema with minor lower leg weeping.  Intact right PT and peroneal Doppler signals Neuro: Intact motor and sensation of the right leg       Assessment/Plan:  This is a 73 y.o. male who is here as a triage visit   -The patient recently underwent extensive right lower extremity revascularization with femoral endarterectomy and femoral to above-knee popliteal artery bypass with PTFE -He started having concerning symptoms for infection on  Sunday.  He developed right groin swelling and redness.  His symptoms have worsened over the past couple days.  He endorses significant pain in the right groin and leg -He has also had fevers last night and this morning.  He endorses puslike drainage from his right groin incision -On exam his right groin incision is mostly intact with a distal area of dehiscence.  There is concerning drainage from this area.  His groin is fairly swollen, erythematous, and tender.  His right popliteal incision does not appear infected, however there is nearby bruising.  He also has significant right lower extremity swelling -His right lower extremity bypass is patent with intact PT and peroneal Doppler signals -This patient was co-evaluated by Dr. Randie Heinz.  We have explained to the patient and family that the patient will be direct admitted to 4 E. today.  We will plan for surgery tomorrow.  We have explained to the patient this may require right groin washout, possible popliteal washout, and possible PTFE excision.  The patient is agreeable to surgery.  Will place orders for IV fluids and broad-spectrum antibiotics upon admission   Justin Wells C. Randie Heinz, MD [facility] [place] Office: [phone] Pager: [phone]

## 2023-10-22 NOTE — Telephone Encounter (Signed)
Triage:  -wife called answering service stating is right leg is swollen badly, has a temp of 100.7, and had surgery last week. -called wife and she stated that she spoke to Dr. Randie Heinz last night and he told her to come up here this morning that he wanted to see him but he would probably admit him to the hospital.   -she reports he has a temperature of 100.7, is talking out of his head, he is having a difficult time urinating, his leg is swollen and his groin looks infected.   -she states she is on her way in

## 2023-10-22 NOTE — Progress Notes (Signed)
Pt arrived to unit from  HOME VSS, A/O x 4,  CCMD called ,CHG given, pt oriented to unit,Will continue to monitor.   Karna Christmas Neliah Cuyler, RN    10/22/23 1229  Vitals  Temp 97.7 F (36.5 C)  Temp Source Oral  BP 129/64  MAP (mmHg) 83  BP Location Left Arm  BP Method Automatic  Patient Position (if appropriate) Lying  Pulse Rate 63  Pulse Rate Source Monitor  ECG Heart Rate 63  Resp 15  Level of Consciousness  Level of Consciousness Alert  Oxygen Therapy  SpO2 99 %  O2 Device Room Air  O2 Flow Rate (L/min) 0 L/min  Pain Assessment  Pain Scale 0-10  Pain Score 7  Pain Type Surgical pain  MEWS Score  MEWS Temp 0  MEWS Systolic 0  MEWS Pulse 0  MEWS RR 0  MEWS LOC 0  MEWS Score 0  MEWS Score Color Chilton Si

## 2023-10-22 NOTE — Progress Notes (Signed)
Pharmacy Antibiotic Note  Justin Wells is a 73 y.o. male admitted on 10/22/2023 with L groin wound infection (s/p femoral endarterectomy a bypass on 2/11)   Pharmacy has been consulted for vancomycin dosing (he is also on zosyn). -labs pending (SCr 1/0 on 2/12)  Plan: -Vancomycin 2000mg  IV x1 followed by 750mg  IV q12h -Will follow renal function, cultures and clinical progress    Temp (24hrs), Avg:97.9 F (36.6 C), Min:97.7 F (36.5 C), Max:98.1 F (36.7 C)  No results for input(s): "WBC", "CREATININE", "LATICACIDVEN", "VANCOTROUGH", "VANCOPEAK", "VANCORANDOM", "GENTTROUGH", "GENTPEAK", "GENTRANDOM", "TOBRATROUGH", "TOBRAPEAK", "TOBRARND", "AMIKACINPEAK", "AMIKACINTROU", "AMIKACIN" in the last 168 hours.  Estimated Creatinine Clearance: 78.3 mL/min (by C-G formula based on SCr of 1 mg/dL).    No Known Allergies    Thank you for allowing pharmacy to be a part of this patient's care.  Harland German, PharmD Clinical Pharmacist **Pharmacist phone directory can now be found on [email or web address] ([id number]).  Listed under [facility].

## 2023-10-22 NOTE — Progress Notes (Signed)
POST OPERATIVE OFFICE NOTE    CC:  F/u for surgery  HPI:  Justin Wells is a 73 y.o. male who presents today as a triage visit.  He recently underwent extensive right common femoral, external iliac, profundofemoral, and superficial femoral artery endarterectomy with bovine pericardial patch angioplasty, plus right common femoral to above-knee popliteal artery bypass with ringed PTFE.  This was done for critical limb ischemia with rest pain.  He returns today as a triage visit.  He states starting on Sunday his right groin incision became swollen, red, and tender.  Over the past couple of days his redness has worsened and pain has increased.  He started having drainage from the incision yesterday.  He also had fevers last night and this morning.  His right leg has also been fairly swollen since surgery, causing weeping.  He says his right foot still hurts a lot.   No Known Allergies  Current Outpatient Medications  Medication Sig Dispense Refill   acetaminophen (TYLENOL) 500 MG tablet Take 1,000 mg by mouth every 6 (six) hours as needed for moderate pain.     aspirin EC 81 MG tablet Take 81 mg by mouth daily. Swallow whole.     atorvastatin (LIPITOR) 80 MG tablet Take 80 mg by mouth at bedtime.     clopidogrel (PLAVIX) 75 MG tablet Take 1 tablet by mouth once daily 90 tablet 3   DULoxetine (CYMBALTA) 60 MG capsule Take 60 mg by mouth at bedtime.     esomeprazole (NEXIUM) 40 MG capsule Take 40 mg by mouth in the morning and at bedtime.     finasteride (PROSCAR) 5 MG tablet Take 5 mg by mouth in the morning.     fluticasone (FLONASE) 50 MCG/ACT nasal spray Place 2 sprays into both nostrils in the morning and at bedtime.     gabapentin (NEURONTIN) 100 MG capsule Take 100 mg by mouth 2 (two) times daily.     isosorbide mononitrate (IMDUR) 60 MG 24 hr tablet Take 60 mg by mouth every morning.     levETIRAcetam (KEPPRA) 750 MG tablet Take 1 tablet (750 mg total) by mouth 2 (two) times daily. Take  1.5 tablets twice daily (Patient taking differently: Take 750 mg by mouth 2 (two) times daily.) 60 tablet 1   loratadine (CLARITIN) 10 MG tablet Take 10 mg by mouth in the morning.     metoprolol succinate (TOPROL-XL) 25 MG 24 hr tablet Take 25 mg by mouth in the morning.     mirtazapine (REMERON) 15 MG tablet Take 15 mg by mouth at bedtime.     morphine (MS CONTIN) 15 MG 12 hr tablet Take 15 mg by mouth 2 (two) times daily.     nitroGLYCERIN (NITROSTAT) 0.4 MG SL tablet Place 0.4 mg under the tongue every 5 (five) minutes as needed for chest pain.     Oxycodone HCl 10 MG TABS Take 10 mg by mouth every 4 (four) hours.     polyethylene glycol (MIRALAX / GLYCOLAX) 17 g packet Take 17 g by mouth daily as needed for moderate constipation.     tamsulosin (FLOMAX) 0.4 MG CAPS capsule Take 0.4 mg by mouth in the morning.     Tetrahydrozoline HCl (VISINE OP) Place 1 drop into both eyes daily as needed (dry eyes).     valsartan (DIOVAN) 40 MG tablet Take 40 mg by mouth in the morning.     No current facility-administered medications for this visit.     ROS:  See HPI  Physical Exam:   Incision: Right groin incision mostly intact with distal, superficial dehiscence.  Significant swelling and redness to the right groin.  Right popliteal incision healing appropriately with surrounding swelling Extremities: Extensive right lower extremity edema with minor lower leg weeping.  Intact right PT and peroneal Doppler signals Neuro: Intact motor and sensation of the right leg    Assessment/Plan:  This is a 73 y.o. male who is here as a triage visit  -The patient recently underwent extensive right lower extremity revascularization with femoral endarterectomy and femoral to above-knee popliteal artery bypass with PTFE -He started having concerning symptoms for infection on Sunday.  He developed right groin swelling and redness.  His symptoms have worsened over the past couple days.  He endorses significant  pain in the right groin and leg -He has also had fevers last night and this morning.  He endorses puslike drainage from his right groin incision -On exam his right groin incision is mostly intact with a distal area of dehiscence.  There is concerning drainage from this area.  His groin is fairly swollen, erythematous, and tender.  His right popliteal incision does not appear infected, however there is nearby bruising.  He also has significant right lower extremity swelling -His right lower extremity bypass is patent with intact PT and peroneal Doppler signals -This patient was co-evaluated by Dr. Randie Heinz.  We have explained to the patient and family that the patient will be direct admitted to 4 E. today.  We will plan for surgery tomorrow.  We have explained to the patient this may require right groin washout, possible popliteal washout, and possible PTFE excision.  The patient is agreeable to surgery.  Will place orders for IV fluids and broad-spectrum antibiotics upon admission   Loel Dubonnet, PA-C [facility] [phone]   Clinic MD:  Randie Heinz

## 2023-10-23 ENCOUNTER — Other Ambulatory Visit: Payer: Self-pay

## 2023-10-23 ENCOUNTER — Encounter (HOSPITAL_COMMUNITY)
Admission: AD | Disposition: A | Discharge status: Home Health | Payer: Self-pay | Source: Ambulatory Visit | Attending: Vascular Surgery

## 2023-10-23 ENCOUNTER — Encounter (HOSPITAL_COMMUNITY): Payer: Self-pay | Admitting: Vascular Surgery

## 2023-10-23 ENCOUNTER — Inpatient Hospital Stay (HOSPITAL_COMMUNITY): Payer: Medicare HMO | Admitting: Anesthesiology

## 2023-10-23 ENCOUNTER — Inpatient Hospital Stay (HOSPITAL_COMMUNITY): Admission: RE | Admit: 2023-10-23 | Payer: Medicare HMO | Source: Home / Self Care | Admitting: Vascular Surgery

## 2023-10-23 DIAGNOSIS — I2510 Atherosclerotic heart disease of native coronary artery without angina pectoris: Secondary | ICD-10-CM

## 2023-10-23 DIAGNOSIS — T8149XA Infection following a procedure, other surgical site, initial encounter: Secondary | ICD-10-CM | POA: Diagnosis not present

## 2023-10-23 DIAGNOSIS — I10 Essential (primary) hypertension: Secondary | ICD-10-CM | POA: Diagnosis not present

## 2023-10-23 DIAGNOSIS — I9789 Other postprocedural complications and disorders of the circulatory system, not elsewhere classified: Secondary | ICD-10-CM | POA: Diagnosis not present

## 2023-10-23 DIAGNOSIS — F418 Other specified anxiety disorders: Secondary | ICD-10-CM

## 2023-10-23 DIAGNOSIS — L03314 Cellulitis of groin: Secondary | ICD-10-CM

## 2023-10-23 HISTORY — PX: GROIN DEBRIDEMENT: SHX5159

## 2023-10-23 HISTORY — PX: APPLICATION OF WOUND VAC: SHX5189

## 2023-10-23 LAB — TYPE AND SCREEN
ABO/RH(D): O NEG
Antibody Screen: NEGATIVE

## 2023-10-23 SURGERY — DEBRIDEMENT, INGUINAL REGION
Anesthesia: General | Site: Groin | Laterality: Right

## 2023-10-23 MED ORDER — ROCURONIUM BROMIDE 10 MG/ML (PF) SYRINGE
PREFILLED_SYRINGE | INTRAVENOUS | Status: DC | PRN
  Administered 2023-10-23: 60 mg via INTRAVENOUS

## 2023-10-23 MED ORDER — ONDANSETRON HCL 4 MG/2ML IJ SOLN
INTRAMUSCULAR | Status: DC | PRN
  Administered 2023-10-23: 4 mg via INTRAVENOUS

## 2023-10-23 MED ORDER — PROPOFOL 10 MG/ML IV BOLUS
INTRAVENOUS | Status: AC
Start: 2023-10-23 — End: ?
  Filled 2023-10-23: qty 20

## 2023-10-23 MED ORDER — MIDAZOLAM HCL 2 MG/2ML IJ SOLN: 0.5000 mg | Freq: Once | INTRAMUSCULAR | Status: DC | PRN

## 2023-10-23 MED ORDER — HEPARIN SODIUM (PORCINE) 5000 UNIT/ML IJ SOLN
5000.0000 [IU] | Freq: Three times a day (TID) | INTRAMUSCULAR | Status: DC
  Administered 2023-10-24 – 2023-10-31 (×22): 5000 [IU] via SUBCUTANEOUS
  Filled 2023-10-23 (×21): qty 1

## 2023-10-23 MED ORDER — FENTANYL CITRATE (PF) 250 MCG/5ML IJ SOLN
INTRAMUSCULAR | Status: DC | PRN
  Administered 2023-10-23: 100 ug via INTRAVENOUS

## 2023-10-23 MED ORDER — HYDROMORPHONE HCL 1 MG/ML IJ SOLN
INTRAMUSCULAR | Status: AC
  Filled 2023-10-23: qty 1

## 2023-10-23 MED ORDER — DEXAMETHASONE SODIUM PHOSPHATE 10 MG/ML IJ SOLN
INTRAMUSCULAR | Status: DC | PRN
  Administered 2023-10-23: 5 mg via INTRAVENOUS

## 2023-10-23 MED ORDER — CHLORHEXIDINE GLUCONATE 0.12 % MT SOLN
15.0000 mL | Freq: Once | OROMUCOSAL | Status: AC
  Administered 2023-10-23: 15 mL via OROMUCOSAL
  Filled 2023-10-23: qty 15

## 2023-10-23 MED ORDER — ROCURONIUM BROMIDE 10 MG/ML (PF) SYRINGE
PREFILLED_SYRINGE | INTRAVENOUS | Status: AC
  Filled 2023-10-23: qty 10

## 2023-10-23 MED ORDER — CHLORHEXIDINE GLUCONATE 4 % EX SOLN
CUTANEOUS | Status: AC
  Filled 2023-10-23: qty 60

## 2023-10-23 MED ORDER — ORAL CARE MOUTH RINSE: 15.0000 mL | Freq: Once | OROMUCOSAL | Status: AC

## 2023-10-23 MED ORDER — VASHE WOUND IRRIGATION OPTIME
TOPICAL | Status: DC | PRN
  Administered 2023-10-23: 102 [oz_av]

## 2023-10-23 MED ORDER — MEPERIDINE HCL 25 MG/ML IJ SOLN: 6.2500 mg | INTRAMUSCULAR | Status: DC | PRN

## 2023-10-23 MED ORDER — OXYCODONE HCL 5 MG PO TABS: 5.0000 mg | ORAL_TABLET | Freq: Once | ORAL | Status: DC | PRN

## 2023-10-23 MED ORDER — LACTATED RINGERS IV SOLN: INTRAVENOUS | Status: DC | PRN

## 2023-10-23 MED ORDER — LIDOCAINE 2% (20 MG/ML) 5 ML SYRINGE
INTRAMUSCULAR | Status: DC | PRN
  Administered 2023-10-23: 40 mg via INTRAVENOUS

## 2023-10-23 MED ORDER — DEXAMETHASONE SODIUM PHOSPHATE 10 MG/ML IJ SOLN
INTRAMUSCULAR | Status: AC
  Filled 2023-10-23: qty 1

## 2023-10-23 MED ORDER — HYDROMORPHONE HCL 1 MG/ML IJ SOLN
0.2500 mg | INTRAMUSCULAR | Status: DC | PRN
  Administered 2023-10-23 (×3): 0.5 mg via INTRAVENOUS

## 2023-10-23 MED ORDER — ONDANSETRON HCL 4 MG/2ML IJ SOLN
INTRAMUSCULAR | Status: AC
  Filled 2023-10-23: qty 2

## 2023-10-23 MED ORDER — FENTANYL CITRATE (PF) 250 MCG/5ML IJ SOLN
INTRAMUSCULAR | Status: AC
  Filled 2023-10-23: qty 5

## 2023-10-23 MED ORDER — 0.9 % SODIUM CHLORIDE (POUR BTL) OPTIME
TOPICAL | Status: DC | PRN
  Administered 2023-10-23: 1000 mL

## 2023-10-23 MED ORDER — LACTATED RINGERS IV SOLN: INTRAVENOUS | Status: DC

## 2023-10-23 MED ORDER — SUGAMMADEX SODIUM 200 MG/2ML IV SOLN
INTRAVENOUS | Status: DC | PRN
  Administered 2023-10-23: 280 mg via INTRAVENOUS
  Administered 2023-10-23: 100 mg via INTRAVENOUS

## 2023-10-23 MED ORDER — PROPOFOL 10 MG/ML IV BOLUS
INTRAVENOUS | Status: AC
  Filled 2023-10-23: qty 20

## 2023-10-23 MED ORDER — PROPOFOL 10 MG/ML IV BOLUS
INTRAVENOUS | Status: DC | PRN
  Administered 2023-10-23: 100 mg via INTRAVENOUS

## 2023-10-23 MED ORDER — OXYCODONE HCL 5 MG/5ML PO SOLN: 5.0000 mg | Freq: Once | ORAL | Status: DC | PRN

## 2023-10-23 MED ORDER — ACETAMINOPHEN 500 MG PO TABS
1000.0000 mg | ORAL_TABLET | Freq: Once | ORAL | Status: AC
  Administered 2023-10-23: 1000 mg via ORAL
  Filled 2023-10-23: qty 2

## 2023-10-23 MED ORDER — SUGAMMADEX SODIUM 200 MG/2ML IV SOLN
INTRAVENOUS | Status: AC
  Filled 2023-10-23: qty 2

## 2023-10-23 MED ORDER — CHLORHEXIDINE GLUCONATE 0.12 % MT SOLN
OROMUCOSAL | Status: AC
Start: 2023-10-23 — End: 2023-10-23
  Filled 2023-10-23: qty 15

## 2023-10-23 MED ORDER — PHENYLEPHRINE HCL-NACL 20-0.9 MG/250ML-% IV SOLN
INTRAVENOUS | Status: DC | PRN
  Administered 2023-10-23: 35 ug/min via INTRAVENOUS

## 2023-10-23 SURGICAL SUPPLY — 39 items
BAG COUNTER SPONGE SURGICOUNT (BAG) ×1 IMPLANT
BAG ISOLATION DRAPE 18X18 (DRAPES) IMPLANT
BENZOIN TINCTURE AMPULE (MISCELLANEOUS) IMPLANT
CANISTER SUCT 3000ML PPV (MISCELLANEOUS) ×1 IMPLANT
CANISTER WOUND CARE 500ML ATS (WOUND CARE) IMPLANT
CLAMP SUTURE YELLOW 5 PAIRS (MISCELLANEOUS) IMPLANT
CLEANSER WND VASHE INSTL 34OZ (WOUND CARE) IMPLANT
CLIP TI MEDIUM 6 (CLIP) IMPLANT
CLIP TI WIDE RED SMALL 6 (CLIP) IMPLANT
CONNECTOR Y WND VAC (MISCELLANEOUS) IMPLANT
DRAPE DERMATAC (DRAPES) IMPLANT
DRAPE HALF SHEET 40X57 (DRAPES) IMPLANT
DRSG VAC GRANUFOAM MED (GAUZE/BANDAGES/DRESSINGS) IMPLANT
DRSG VERSA FOAM LRG 10X15 (GAUZE/BANDAGES/DRESSINGS) IMPLANT
ELECT REM PT RETURN 9FT ADLT (ELECTROSURGICAL) ×1 IMPLANT
ELECTRODE REM PT RTRN 9FT ADLT (ELECTROSURGICAL) ×1 IMPLANT
GAUZE SPONGE 4X4 12PLY STRL (GAUZE/BANDAGES/DRESSINGS) ×1 IMPLANT
GLOVE BIOGEL PI IND STRL 7.5 (GLOVE) ×1 IMPLANT
GLOVE SURG SS PI 7.5 STRL IVOR (GLOVE) ×1 IMPLANT
GOWN STRL REUS W/ TWL LRG LVL3 (GOWN DISPOSABLE) ×2 IMPLANT
GOWN STRL REUS W/ TWL XL LVL3 (GOWN DISPOSABLE) ×2 IMPLANT
KIT BASIN OR (CUSTOM PROCEDURE TRAY) ×1 IMPLANT
KIT TURNOVER KIT B (KITS) ×1 IMPLANT
NS IRRIG 1000ML POUR BTL (IV SOLUTION) ×1 IMPLANT
PACK GENERAL/GYN (CUSTOM PROCEDURE TRAY) ×1 IMPLANT
PACK UNIVERSAL I (CUSTOM PROCEDURE TRAY) ×1 IMPLANT
PAD ARMBOARD 7.5X6 YLW CONV (MISCELLANEOUS) ×2 IMPLANT
PAD NEG PRESSURE SENSATRAC (MISCELLANEOUS) IMPLANT
SUT PROLENE 5 0 C 1 24 (SUTURE) IMPLANT
SUT PROLENE 6 0 BV (SUTURE) IMPLANT
SUT SILK 2-0 18XBRD TIE 12 (SUTURE) IMPLANT
SUT SILK 3-0 18XBRD TIE 12 (SUTURE) IMPLANT
SUT VIC AB 2-0 CT1 TAPERPNT 27 (SUTURE) IMPLANT
SWAB CULTURE ESWAB REG 1ML (MISCELLANEOUS) IMPLANT
SWAB CULTURE LIQ STUART DBL (MISCELLANEOUS) IMPLANT
SYR BULB IRRIG 60ML STRL (SYRINGE) IMPLANT
TAG SUTURE CLAMP YLW 5PR (MISCELLANEOUS) ×1 IMPLANT
TOWEL GREEN STERILE (TOWEL DISPOSABLE) ×1 IMPLANT
WATER STERILE IRR 1000ML POUR (IV SOLUTION) ×1 IMPLANT

## 2023-10-23 NOTE — Anesthesia Postprocedure Evaluation (Signed)
Anesthesia Post Note  Patient: Justin Wells  Procedure(s) Performed: RIGHT GROIN AND UPPER LEG IRRIGATION AND DEBRIDEMENT WITH APPLICATION OF WOUND VAC (Right: Groin) APPLICATION OF WOUND VAC (Right: Groin)     Patient location during evaluation: PACU Anesthesia Type: General Level of consciousness: awake and alert, patient cooperative and oriented Pain management: pain level controlled Vital Signs Assessment: post-procedure vital signs reviewed and stable Respiratory status: spontaneous breathing, nonlabored ventilation, respiratory function stable and patient connected to nasal cannula oxygen Cardiovascular status: blood pressure returned to baseline and stable Postop Assessment: no apparent nausea or vomiting Anesthetic complications: no   No notable events documented.  Last Vitals:  Vitals:   10/23/23 1215 10/23/23 1237  BP: 120/68 123/69  Pulse: (!) 54 (!) 59  Resp: 14 12  Temp: 36.7 C 36.8 C  SpO2: 97% 97%    Last Pain:  Vitals:   10/23/23 1237  TempSrc: Oral  PainSc: 8                  Seda Kronberg,E. [place]

## 2023-10-23 NOTE — Op Note (Signed)
DATE OF SERVICE: 10/23/2023  PATIENT:  Justin Wells  73 y.o. male  PRE-OPERATIVE DIAGNOSIS:  cellulitis, serous drainage from right groin after right femoral-popliteal bypass  POST-OPERATIVE DIAGNOSIS:  Same  PROCEDURE:   1) incision, drainage, and washout of right groin and right popliteal incision 2) initiation of negative pressure therapy to right groin and right popliteal incisions  SURGEON:  Surgeons and Role:    * Leonie Douglas, MD - Primary  ASSISTANT: Loel Dubonnet, PA-C  An experienced assistant was required given the complexity of this procedure and the standard of surgical care. My assistant helped with exposure through counter tension, suctioning, ligation and retraction to better visualize the surgical field.  My assistant expedited sewing during the case by following my sutures. Wherever I use the term "we" in the report, my assistant actively helped me with that portion of the procedure.  ANESTHESIA:   general  EBL: minimal  BLOOD ADMINISTERED:none  DRAINS: VAC to incisions   LOCAL MEDICATIONS USED:  NONE  SPECIMEN:  culture / gram stain from right groin seroma  COUNTS: confirmed correct.  TOURNIQUET:  none  PATIENT DISPOSITION:  PACU - hemodynamically stable.   Delay start of Pharmacological VTE agent (>24hrs) due to surgical blood loss or risk of bleeding: no  INDICATION FOR PROCEDURE: Justin Wells is a 73 y.o. male with redness and drainage from right groin after femoral-popliteal bypass to 10/14/23. After careful discussion of risks, benefits, and alternatives the patient was offered incision and drainage. We specifically discussed potential risk of anastomotic disruption and need for repair to control bleeding. The patient understood and wished to proceed.  OPERATIVE FINDINGS: Yellow-tinged seroma encountered in right groin was under significant pressure.  This was cultured.  The vascular repair was seen at the base of the right groin, but appeared  well incorporated.  Hematoma encountered in the popliteal incision which was evacuated.  No signs of frank infection.  The anastomosis was completely incorporated, I did see some of the graft in the wound bed.  DESCRIPTION OF PROCEDURE: After identification of the patient in the pre-operative holding area, the patient was transferred to the operating room. The patient was positioned supine on the operating room table. Anesthesia was induced. The right leg was prepped and draped in standard fashion. A surgical pause was performed confirming correct patient, procedure, and operative location.  Both incisions in the right leg were reopened with Metzenbaum scissors.  All suture material was debrided.  In the right groin, a tense seroma was encountered and drained.  The fluid is yellow-tinged.  Culture and Gram stain was taken.  After evacuation, the femoral endarterectomy, bovine patch, and proximal anastomosis of the femoral-popliteal bypass were encountered.  These were all well incorporated with no active bleeding.  The wound was copiously irrigated with Vashe irrigant.  I loosely reapproximated the subcutaneous space and placed to white wicking sponges into the seroma cavity to encourage the cavity to collapse during wound VAC therapy.  A black sponge was applied to the subcutaneous wound.  The wound was dressed with derma tack.  Good seal was achieved.  In the right popliteal space, hematoma was encountered in the wound bed.  This did not appear grossly infected.  I did see the distal aspect of the vas graft, but the anastomosis was completely covered by healing tissue.  I elected to not expose this further.  This wound was copiously irrigated with Vashe.  A single white wicking sponge was placed into the  wound cavity.  The subcutaneous tissue was reapproximated with 2-0 Vicryl.  A black sponge was applied to the subcutaneous wound.  Derma tack was applied with good seal.  Upon completion of the case  instrument and sharps counts were confirmed correct. The patient was transferred to the PACU in good condition. I was present for all portions of the procedure.  FOLLOW UP PLAN: Return to operating room Monday, 10/27/2023 for reevaluation under anesthesia.  Rande Brunt. Lenell Antu, MD [facility] Phone Number: [phone] 10/23/2023 10:46 AM

## 2023-10-23 NOTE — Progress Notes (Signed)
Wound VAC tubing from R thigh not suctioning past Y site.  Dr Lenell Antu aware, verbal order to switch to its own VAC device.  Unfortunately alarmed "blockage" immediately.  Verbal order to removed dressing and replace with wet to dry.  This was done with charge RN Candise Bowens to assist.  Pt tolerated well but requesting pain meds.  Will report thoroughly to night RN.

## 2023-10-23 NOTE — Transfer of Care (Signed)
Immediate Anesthesia Transfer of Care Note  Patient: Justin Wells  Procedure(s) Performed: RIGHT GROIN DEBRIDEMENT WITH POSSIBLE POPLIEAL EXPOSURE AND POSSIBLE PTFE EXCISION OF RIGHT LEG (Right)  Patient Location: PACU  Anesthesia Type:General  Level of Consciousness: awake, alert , and patient cooperative  Airway & Oxygen Therapy: Patient Spontanous Breathing  Post-op Assessment: Report given to RN, Post -op Vital signs reviewed and stable, and Patient moving all extremities X 4  Post vital signs: Reviewed and stable  Last Vitals:  Vitals Value Taken Time  BP 124/68 10/23/23 1045  Temp    Pulse 59 10/23/23 1048  Resp 13 10/23/23 1048  SpO2 97 % 10/23/23 1048  Vitals shown include unfiled device data.  Last Pain:  Vitals:   10/23/23 0829  TempSrc: Oral  PainSc:          Complications: No notable events documented.

## 2023-10-23 NOTE — Anesthesia Procedure Notes (Signed)
Procedure Name: Intubation Date/Time: 10/23/2023 9:44 AM  Performed by: Aundria Rud, CRNAPre-anesthesia Checklist: Patient identified, Emergency Drugs available, Suction available and Patient being monitored Patient Re-evaluated:Patient Re-evaluated prior to induction Oxygen Delivery Method: Circle System Utilized Preoxygenation: Pre-oxygenation with 100% oxygen Induction Type: IV induction Ventilation: Two handed mask ventilation required and Oral airway inserted - appropriate to patient size Laryngoscope Size: Hyacinth Meeker and 2 Grade View: Grade I Tube type: Oral Tube size: 7.5 mm Number of attempts: 1 Airway Equipment and Method: Stylet and Oral airway Placement Confirmation: ETT inserted through vocal cords under direct vision, positive ETCO2 and breath sounds checked- equal and bilateral Secured at: 21 cm Tube secured with: Tape Dental Injury: Teeth and Oropharynx as per pre-operative assessment

## 2023-10-23 NOTE — Progress Notes (Addendum)
      Plan for OR today right groin possible infection NPO  Patient agrees to proceed Vanc, and Zosyn started 10/22/23, Ancef for pre-op  Mosetta Pigeon PA-C  VASCULAR STAFF ADDENDUM: I have independently interviewed and examined the patient. I agree with the above.   Rande Brunt. Lenell Antu, MD [facility] Phone Number: [phone] 10/23/2023 8:58 AM

## 2023-10-23 NOTE — Anesthesia Preprocedure Evaluation (Addendum)
Anesthesia Evaluation  Patient identified by MRN, date of birth, ID band Patient awake    Reviewed: Allergy & Precautions, NPO status , Patient's Chart, lab work & pertinent test results, reviewed documented beta blocker date and time   History of Anesthesia Complications Negative for: history of anesthetic complications  Airway Mallampati: II  TM Distance: >3 FB Neck ROM: Full    Dental  (+) Edentulous Upper, Edentulous Lower, Upper Dentures   Pulmonary former smoker   breath sounds clear to auscultation       Cardiovascular hypertension, Pt. on medications and Pt. on home beta blockers (-) angina + CAD and + Peripheral Vascular Disease   Rhythm:Regular Rate:Normal  '17 ECHO:  - Left ventricle: The cavity size was normal. There was mild concentric LVH. Systolic function was normal. EF 55% to 60%. Wall motion was normal;  no regional wall motion  abnormalities. (grade 1 diastolic dysfunction). -no significant valvular abnormalities     Neuro/Psych  Headaches, Seizures -,   Anxiety Depression    CVA (R weakness), Residual Symptoms    GI/Hepatic Neg liver ROS, hiatal hernia,GERD  Medicated and Controlled,,S/p lap band   Endo/Other  BMI 31  Renal/GU negative Renal ROS     Musculoskeletal   Abdominal   Peds  Hematology  (+) Blood dyscrasia (Hb 10.0), anemia Plavix   Anesthesia Other Findings   Reproductive/Obstetrics                             Anesthesia Physical Anesthesia Plan  ASA: 3  Anesthesia Plan: General   Post-op Pain Management: Tylenol PO (pre-op)*   Induction: Intravenous  PONV Risk Score and Plan: 2 and Ondansetron, Treatment may vary due to age or medical condition and Dexamethasone  Airway Management Planned: Oral ETT  Additional Equipment: None  Intra-op Plan:   Post-operative Plan: Extubation in OR  Informed Consent: I have reviewed the patients History and  Physical, chart, labs and discussed the procedure including the risks, benefits and alternatives for the proposed anesthesia with the patient or authorized representative who has indicated his/her understanding and acceptance.     Dental advisory given  Plan Discussed with: CRNA and Surgeon  Anesthesia Plan Comments:         Anesthesia Quick Evaluation

## 2023-10-24 ENCOUNTER — Encounter (HOSPITAL_COMMUNITY): Payer: Self-pay | Admitting: Vascular Surgery

## 2023-10-24 NOTE — Progress Notes (Addendum)
  Progress Note    10/24/2023 8:24 AM 1 Day Post-Op  Subjective:  right leg very painful   Vitals:   10/24/23 0755 10/24/23 0800  BP: 127/71 (!) 140/67  Pulse: (!) 58 (!) 52  Resp: 15 13  Temp: 98.4 F (36.9 C)   SpO2: 99% 94%   Physical Exam: Cardiac:  regular Lungs:  non labored Incisions:  Right groin with VAC to suction. Minimal Serous output. Right popliteal incision black sponge and VAC replaced with good seal Extremities:  RLE very edematous. Doppler DP and PT signals Neurologic: alert and oriented  CBC    Component Value Date/Time   WBC 8.1 10/22/2023 1230   RBC 3.40 (L) 10/22/2023 1230   HGB 10.0 (L) 10/22/2023 1230   HCT 29.2 (L) 10/22/2023 1230   PLT 303 10/22/2023 1230   MCV 85.9 10/22/2023 1230   MCH 29.4 10/22/2023 1230   MCHC 34.2 10/22/2023 1230   RDW 16.6 (H) 10/22/2023 1230   LYMPHSABS 1.8 05/08/2016 0416   MONOABS 0.8 05/08/2016 0416   EOSABS 0.4 05/08/2016 0416   BASOSABS 0.1 05/08/2016 0416    BMET    Component Value Date/Time   NA 138 10/22/2023 1230   K 4.2 10/22/2023 1230   CL 99 10/22/2023 1230   CO2 28 10/22/2023 1230   GLUCOSE 145 (H) 10/22/2023 1230   BUN 23 10/22/2023 1230   CREATININE 1.12 10/22/2023 1230   CALCIUM 8.5 (L) 10/22/2023 1230   GFRNONAA >60 10/22/2023 1230   GFRAA >60 07/18/2016 1238    INR    Component Value Date/Time   INR 1.2 10/22/2023 1230     Intake/Output Summary (Last 24 hours) at 10/24/2023 0981 Last data filed at 10/24/2023 1914 Gross per 24 hour  Intake 1942.17 ml  Output 2075 ml  Net -132.83 ml     Assessment/Plan:  73 y.o. male is s/p  1) incision, drainage, and washout of right groin and right popliteal incision 2) initiation of negative pressure therapy to right groin and right popliteal incisions Post Op Day 1    RLE remains well perfused and warm Groin vac with good seal. Minimal serous output in canister Right popliteal incision had lost seal on VAC last night. Wet to dry  dressings were applied. New black sponge and VAC applied this morning with good seal Pain control PRN Afebrile. No leukocytosis Surgical Cx pending On Vanc and Salome Spotted [facility] [phone] 10/24/2023 8:24 AM  I have independently interviewed and examined patient and agree with PA assessment and plan above. Plan for wound vac change Monday at bedside. Needs compression dressing to lower leg.   Lyn Joens C. Randie Heinz, MD [facility] [place] Office: [phone] Pager: [phone]

## 2023-10-24 NOTE — Plan of Care (Signed)
  Problem: Clinical Measurements: Goal: Ability to maintain clinical measurements within normal limits will improve 10/24/2023 0444 by Precious Bard, RN Outcome: Progressing 10/24/2023 0443 by Precious Bard, RN Outcome: Progressing Goal: Cardiovascular complication will be avoided 10/24/2023 0444 by Precious Bard, RN Outcome: Progressing 10/24/2023 0443 by Precious Bard, RN Outcome: Progressing   Problem: Pain Managment: Goal: General experience of comfort will improve and/or be controlled 10/24/2023 0444 by Precious Bard, RN Outcome: Progressing 10/24/2023 0443 by Precious Bard, RN Outcome: Progressing   Problem: Clinical Measurements: Goal: Postoperative complications will be avoided or minimized Outcome: Progressing   Problem: Skin Integrity: Goal: Demonstration of wound healing without infection will improve Outcome: Progressing

## 2023-10-24 NOTE — Progress Notes (Signed)
Mobility Specialist Progress Note:   10/24/23 0945  Mobility  Activity Ambulated with assistance in hallway;Ambulated with assistance to bathroom;Ambulated with assistance in room;Transferred from bed to chair  Level of Assistance Contact guard assist, steadying assist  Assistive Device Front wheel walker  Distance Ambulated (ft) 500 ft  Activity Response Tolerated well  Mobility Referral Yes  Mobility visit 1 Mobility  Mobility Specialist Start Time (ACUTE ONLY) 0945  Mobility Specialist Stop Time (ACUTE ONLY) 1000  Mobility Specialist Time Calculation (min) (ACUTE ONLY) 15 min   Pt received in bed, eager for mobility, and very motivated. Ambulated to bathroom and out on hallway with MinG and RW. Required MinA for wound vac and line management. Tolerated very well, had mild fatigue after session. Sitting up in chair comfortably with all needs met, call bell in reach.    Feliciana Rossetti Mobility Specialist Please contact via Special educational needs teacher or  Rehab office at [phone]

## 2023-10-25 NOTE — Plan of Care (Signed)
   Problem: Education: Goal: Knowledge of General Education information will improve Description Including pain rating scale, medication(s)/side effects and non-pharmacologic comfort measures Outcome: Progressing

## 2023-10-25 NOTE — Progress Notes (Addendum)
 Progress Note    10/25/2023 7:47 AM 2 Days Post-Op  Subjective:  says he feels better this morning; says he is supposed to have vac change in OR Monday  Afebrile HR 50's-60's  100's-120's systolic 98% RA  Vitals:   10/24/23 2025 10/25/23 0104  BP: 109/60 124/69  Pulse: 61 61  Resp: 15 12  Temp: 98.4 F (36.9 C) 98 F (36.7 C)  SpO2: 99% 99%    Physical Exam: General:  no distress sitting in chair Cardiac:  regular Lungs:  non labored Incisions:  groin and AK vac with good seal Extremities:  brisk right PT doppler flow; still with swelling in foot but appears somewhat improved today.   CBC    Component Value Date/Time   WBC 8.1 10/22/2023 1230   RBC 3.40 (L) 10/22/2023 1230   HGB 10.0 (L) 10/22/2023 1230   HCT 29.2 (L) 10/22/2023 1230   PLT 303 10/22/2023 1230   MCV 85.9 10/22/2023 1230   MCH 29.4 10/22/2023 1230   MCHC 34.2 10/22/2023 1230   RDW 16.6 (H) 10/22/2023 1230   LYMPHSABS 1.8 05/08/2016 0416   MONOABS 0.8 05/08/2016 0416   EOSABS 0.4 05/08/2016 0416   BASOSABS 0.1 05/08/2016 0416    BMET    Component Value Date/Time   NA 138 10/22/2023 1230   K 4.2 10/22/2023 1230   CL 99 10/22/2023 1230   CO2 28 10/22/2023 1230   GLUCOSE 145 (H) 10/22/2023 1230   BUN 23 10/22/2023 1230   CREATININE 1.12 10/22/2023 1230   CALCIUM 8.5 (L) 10/22/2023 1230   GFRNONAA >60 10/22/2023 1230   GFRAA >60 07/18/2016 1238    INR    Component Value Date/Time   INR 1.2 10/22/2023 1230     Intake/Output Summary (Last 24 hours) at 10/25/2023 0747 Last data filed at 10/25/2023 0734 Gross per 24 hour  Intake 1080 ml  Output 2750 ml  Net -1670 ml    Specimen Description WOUND  Special Requests right groin seroma  Gram Stain NO WBC SEEN NO ORGANISMS SEEN  Culture RARE GRAM NEGATIVE RODS IDENTIFICATION AND SUSCEPTIBILITIES TO FOLLOW Performed at [facility], [address], [place], [place] [address]  Report Status PENDING    Assessment/Plan:  73  y.o. male is s/p:  Extensive right CFA endarterectomy including EIA, profunda and SFA with bovine pericardial patch angioplasty and right CFA to AK popliteal artery bypass with PTFE 10/14/2023 by Dr. Randie Heinz 1) incision, drainage, and washout of right groin and right popliteal incision 2) initiation of negative pressure therapy to right groin and right popliteal incisions  10/23/2023 by Dr. Lenell Antu    -pt with brisk right PT doppler flow -both vacs with good seal.  Plan for vac change on Monday.  Will clarify if this will be in OR or at bedside.   -gram stain without organisms but rare GNR on wound culture. Continue vanc and zosyn until final culture resulted.  -DVT prophylaxis:  sq heparin   Doreatha Massed, PA-C [facility] [phone] 10/25/2023 7:47 AM   I have seen and evaluated the patient. I agree with the PA note as documented above.  Patient is now status post I&D of right groin and popliteal incision after recent bypass with PTFE.  No acute events overnight.  Cultures growing gram-negative rods.  Continue broad-spectrum antibiotics with Vanc Zosyn.  Next VAC change planned for Monday.  He has brisk PT signal in the right foot.  Cephus Shelling, MD [facility] [place] Office: [phone]

## 2023-10-25 NOTE — Progress Notes (Signed)
 Pharmacy Antibiotic Note  Justin Wells is a 73 y.o. male admitted on 10/22/2023 with L groin wound infection (s/p femoral endarterectomy a bypass on 2/11)   Pharmacy has been consulted for vancomycin dosing (he is also on zosyn).  2/22: Today is day 4 of antibiotics, patient remains on vancomycin + Zosyn. Patient afebrile, WBC 8.1, Scr 1.12. Wound culture positive for E. Coli, resistant to ampicillin and Unasyn. Plan for 7-day of antibiotics per [facility].   Plan: -Continue vancomycin 750 mg IV every 12 hours (eAUC 568, Scr 1.12) -Continue Zosyn 3.375 g IV every 8 hours  -Will follow renal function, cultures and clinical progress  Temp (24hrs), Avg:98.2 F (36.8 C), Min:97.9 F (36.6 C), Max:98.5 F (36.9 C)  Recent Labs  Lab 10/22/23 1230  WBC 8.1  CREATININE 1.12    Estimated Creatinine Clearance: 69.9 mL/min (by C-G formula based on SCr of 1.12 mg/dL).    No Known Allergies  Cultures: 2/20 Wound Cx: E. Coli (Resistant - Ampicillin, Intermediate - Unasyn)  Thank you for allowing pharmacy to be a part of this patient's care.  Enos Fling, PharmD PGY-1 Acute Care Pharmacy Resident 10/25/2023 12:03 PM

## 2023-10-25 NOTE — Progress Notes (Signed)
 Mobility Specialist Progress Note;   10/25/23 1132  Mobility  Activity Ambulated with assistance in hallway  Level of Assistance Contact guard assist, steadying assist  Assistive Device Front wheel walker  Distance Ambulated (ft) 500 ft  Activity Response Tolerated well  Mobility Referral Yes  Mobility visit 1 Mobility  Mobility Specialist Start Time (ACUTE ONLY) 1132  Mobility Specialist Stop Time (ACUTE ONLY) 1152  Mobility Specialist Time Calculation (min) (ACUTE ONLY) 20 min   Pt eager for mobility. Required MinG assistance during ambulation for safety. VSS throughout and no c/o during session. Requesting pain meds at EOS. Pt returned safely back to chair with all needs met. RN notified.   Caesar Bookman Mobility Specialist Please contact via [employer] or [employer] [phone]

## 2023-10-26 ENCOUNTER — Other Ambulatory Visit: Payer: Self-pay

## 2023-10-26 NOTE — Progress Notes (Signed)
 Patient's wife requesting phone call after surgery/wound vac change Monday 10/26/23.

## 2023-10-26 NOTE — Progress Notes (Signed)
 Mobility Specialist Progress Note:   10/26/23 1456  Mobility  Activity Ambulated with assistance in hallway  Level of Assistance  (MinG)  Assistive Device Front wheel walker  Distance Ambulated (ft) 470 ft  Activity Response Tolerated well  Mobility Referral Yes  Mobility visit 1 Mobility  Mobility Specialist Start Time (ACUTE ONLY) 1415  Mobility Specialist Stop Time (ACUTE ONLY) 1430  Mobility Specialist Time Calculation (min) (ACUTE ONLY) 15 min   Pt received in chair, agreeable to mobility. C/o slight RLE pain, otherwise asx throughout. Pt returned to chair with RN present in room.   Leory Plowman  Mobility Specialist Please contact via [employer] office at [phone]

## 2023-10-26 NOTE — Progress Notes (Addendum)
 Progress Note    10/26/2023 7:09 AM 3 Days Post-Op  Subjective:  sleeping.  Says he has some pain in the incision around the knee.  Feet feel fine.   Afebrile HR 50's-70's  100's-110's systolic 93% RA  Vitals:   10/25/23 0752 10/25/23 1604  BP: 119/74 106/60  Pulse: (!) 59 75  Resp: 11 13  Temp: 97.9 F (36.6 C) 98.6 F (37 C)  SpO2: 96% 100%    Physical Exam: General:  no distress Lungs:  non labored Incisions:  both vacs with good seal   CBC    Component Value Date/Time   WBC 8.1 10/22/2023 1230   RBC 3.40 (L) 10/22/2023 1230   HGB 10.0 (L) 10/22/2023 1230   HCT 29.2 (L) 10/22/2023 1230   PLT 303 10/22/2023 1230   MCV 85.9 10/22/2023 1230   MCH 29.4 10/22/2023 1230   MCHC 34.2 10/22/2023 1230   RDW 16.6 (H) 10/22/2023 1230   LYMPHSABS 1.8 05/08/2016 0416   MONOABS 0.8 05/08/2016 0416   EOSABS 0.4 05/08/2016 0416   BASOSABS 0.1 05/08/2016 0416    BMET    Component Value Date/Time   NA 138 10/22/2023 1230   K 4.2 10/22/2023 1230   CL 99 10/22/2023 1230   CO2 28 10/22/2023 1230   GLUCOSE 145 (H) 10/22/2023 1230   BUN 23 10/22/2023 1230   CREATININE 1.12 10/22/2023 1230   CALCIUM 8.5 (L) 10/22/2023 1230   GFRNONAA >60 10/22/2023 1230   GFRAA >60 07/18/2016 1238    INR    Component Value Date/Time   INR 1.2 10/22/2023 1230     Intake/Output Summary (Last 24 hours) at 10/26/2023 1610 Last data filed at 10/26/2023 0500 Gross per 24 hour  Intake 480 ml  Output 1000 ml  Net -520 ml   Specimen Description WOUND  Special Requests right groin seroma  Gram Stain NO WBC SEEN NO ORGANISMS SEEN Performed at [facility], [address] [place], [place] [address]  Culture RARE ESCHERICHIA COLI NO ANAEROBES ISOLATED; CULTURE IN PROGRESS FOR 5 DAYS  Report Status PENDING  Organism ID, Bacteria ESCHERICHIA COLI   Susceptibility   Escherichia coli    MIC    AMPICILLIN >=32 RESIST... Resistant    AMPICILLIN/SULBACTAM 16 INTERMED...  Intermediate    CEFEPIME <=0.12 SENS... Sensitive    CEFTAZIDIME <=1 SENSITIVE Sensitive    CEFTRIAXONE <=0.25 SENS... Sensitive    CIPROFLOXACIN <=0.25 SENS... Sensitive    GENTAMICIN <=1 SENSITIVE Sensitive    IMIPENEM <=0.25 SENS... Sensitive    PIP/TAZO <=4 SENSITI... Sensitive    TRIMETH/SULFA <=20 SENSIT... Sensitive      Assessment/Plan:  73 y.o. male is s/p:  Extensive right CFA endarterectomy including EIA, profunda and SFA with bovine pericardial patch angioplasty and right CFA to AK popliteal artery bypass with PTFE 10/14/2023 by Dr. Randie Heinz 1) incision, drainage, and washout of right groin and right popliteal incision 2) initiation of negative pressure therapy to right groin and right popliteal incisions  10/23/2023 by Dr. Lenell Antu  3 Days Post-Op   -culture with E coli.  Sensitive to Cipro. -for vac change tomorrow-will verify if this is to be in OR or at bedside. -DVT prophylaxis:  sq heparin   Doreatha Massed, PA-C [facility] [phone] 10/26/2023 7:09 AM  I have seen and evaluated the patient. I agree with the PA note as documented above.  Patient has undergone extensive right femoral endarterectomy with bovine patch and common femoral to above-knee popliteal  bypass with PTFE by Dr. Randie Heinz on 10/14/2023.  Underwent washout of his right groin and above-knee popliteal incision with Dr. Lenell Antu.  Cultures growing E. coli.  Per Dr. Juanetta Gosling note the proximal and distal anastomosis were well incorporated.  Will return OR tomorrow for washout after further discussion with Dr. Randie Heinz.  Continue broad-spectrum antibiotics.  I did discuss gram-negative bacteria in the surgical wound is very high risk for further complication.  Cephus Shelling, MD [facility] [place] Office: [phone]

## 2023-10-26 NOTE — Plan of Care (Signed)

## 2023-10-27 ENCOUNTER — Inpatient Hospital Stay (HOSPITAL_COMMUNITY): Payer: Medicare HMO | Admitting: Anesthesiology

## 2023-10-27 ENCOUNTER — Encounter (HOSPITAL_COMMUNITY)
Admission: AD | Disposition: A | Discharge status: Home Health | Payer: Self-pay | Source: Ambulatory Visit | Attending: Vascular Surgery

## 2023-10-27 ENCOUNTER — Other Ambulatory Visit: Payer: Self-pay

## 2023-10-27 ENCOUNTER — Encounter (HOSPITAL_COMMUNITY): Payer: Self-pay | Admitting: Vascular Surgery

## 2023-10-27 DIAGNOSIS — Z87891 Personal history of nicotine dependence: Secondary | ICD-10-CM | POA: Diagnosis not present

## 2023-10-27 DIAGNOSIS — I97648 Postprocedural seroma of a circulatory system organ or structure following other circulatory system procedure: Secondary | ICD-10-CM | POA: Diagnosis not present

## 2023-10-27 DIAGNOSIS — S8001XA Contusion of right knee, initial encounter: Secondary | ICD-10-CM | POA: Diagnosis not present

## 2023-10-27 DIAGNOSIS — I2510 Atherosclerotic heart disease of native coronary artery without angina pectoris: Secondary | ICD-10-CM

## 2023-10-27 DIAGNOSIS — J449 Chronic obstructive pulmonary disease, unspecified: Secondary | ICD-10-CM | POA: Diagnosis not present

## 2023-10-27 HISTORY — PX: INCISION AND DRAINAGE: SHX5863

## 2023-10-27 HISTORY — PX: GROIN DEBRIDEMENT: SHX5159

## 2023-10-27 LAB — CBC
HCT: 28.7 % — ABNORMAL LOW (ref 39.0–52.0)
Hemoglobin: 9.6 g/dL — ABNORMAL LOW (ref 13.0–17.0)
MCH: 29.4 pg (ref 26.0–34.0)
MCHC: 33.4 g/dL (ref 30.0–36.0)
MCV: 87.8 fL (ref 80.0–100.0)
Platelets: 362 10*3/uL (ref 150–400)
RBC: 3.27 MIL/uL — ABNORMAL LOW (ref 4.22–5.81)
RDW: 16.4 % — ABNORMAL HIGH (ref 11.5–15.5)
WBC: 8.8 10*3/uL (ref 4.0–10.5)
nRBC: 0 % (ref 0.0–0.2)

## 2023-10-27 LAB — TYPE AND SCREEN
ABO/RH(D): O NEG
Antibody Screen: NEGATIVE

## 2023-10-27 LAB — BASIC METABOLIC PANEL
Anion gap: 7 (ref 5–15)
BUN: 6 mg/dL — ABNORMAL LOW (ref 8–23)
CO2: 24 mmol/L (ref 22–32)
Calcium: 8.1 mg/dL — ABNORMAL LOW (ref 8.9–10.3)
Chloride: 104 mmol/L (ref 98–111)
Creatinine, Ser: 0.98 mg/dL (ref 0.61–1.24)
GFR, Estimated: >60 mL/min (ref >60)
Glucose, Bld: 117 mg/dL — ABNORMAL HIGH (ref 70–99)
Potassium: 4.1 mmol/L (ref 3.5–5.1)
Sodium: 135 mmol/L (ref 135–145)

## 2023-10-27 SURGERY — DEBRIDEMENT, INGUINAL REGION
Anesthesia: General | Site: Thigh | Laterality: Right

## 2023-10-27 MED ORDER — PROPOFOL 10 MG/ML IV BOLUS
INTRAVENOUS | Status: AC
  Filled 2023-10-27: qty 20

## 2023-10-27 MED ORDER — OXYCODONE HCL 5 MG PO TABS
ORAL_TABLET | ORAL | Status: AC
Start: 2023-10-27 — End: 2023-10-27
  Administered 2023-10-27: 5 mg via ORAL
  Filled 2023-10-27: qty 1

## 2023-10-27 MED ORDER — FENTANYL CITRATE (PF) 100 MCG/2ML IJ SOLN
INTRAMUSCULAR | Status: AC
  Administered 2023-10-27: 50 ug via INTRAVENOUS
  Filled 2023-10-27: qty 2

## 2023-10-27 MED ORDER — OXYCODONE HCL 5 MG PO TABS: 5.0000 mg | ORAL_TABLET | Freq: Once | ORAL | Status: AC | PRN

## 2023-10-27 MED ORDER — 0.9 % SODIUM CHLORIDE (POUR BTL) OPTIME
TOPICAL | Status: DC | PRN
  Administered 2023-10-27: 1000 mL

## 2023-10-27 MED ORDER — FENTANYL CITRATE (PF) 250 MCG/5ML IJ SOLN
INTRAMUSCULAR | Status: AC
  Filled 2023-10-27: qty 5

## 2023-10-27 MED ORDER — ONDANSETRON HCL 4 MG/2ML IJ SOLN
INTRAMUSCULAR | Status: DC | PRN
  Administered 2023-10-27: 4 mg via INTRAVENOUS

## 2023-10-27 MED ORDER — PHENYLEPHRINE 80 MCG/ML (10ML) SYRINGE FOR IV PUSH (FOR BLOOD PRESSURE SUPPORT)
PREFILLED_SYRINGE | INTRAVENOUS | Status: DC | PRN
  Administered 2023-10-27 (×2): 80 ug via INTRAVENOUS

## 2023-10-27 MED ORDER — CHLORHEXIDINE GLUCONATE 0.12 % MT SOLN
15.0000 mL | Freq: Once | OROMUCOSAL | Status: AC
  Administered 2023-10-27: 15 mL via OROMUCOSAL

## 2023-10-27 MED ORDER — LIDOCAINE 2% (20 MG/ML) 5 ML SYRINGE
INTRAMUSCULAR | Status: DC | PRN
  Administered 2023-10-27: 100 mg via INTRAVENOUS

## 2023-10-27 MED ORDER — FENTANYL CITRATE (PF) 250 MCG/5ML IJ SOLN
INTRAMUSCULAR | Status: DC | PRN
  Administered 2023-10-27 (×4): 25 ug via INTRAVENOUS

## 2023-10-27 MED ORDER — VASHE WOUND IRRIGATION OPTIME
TOPICAL | Status: DC | PRN
  Administered 2023-10-27: 34 [oz_av]

## 2023-10-27 MED ORDER — ONDANSETRON HCL 4 MG/2ML IJ SOLN
INTRAMUSCULAR | Status: AC
  Filled 2023-10-27: qty 2

## 2023-10-27 MED ORDER — FENTANYL CITRATE (PF) 100 MCG/2ML IJ SOLN: 25.0000 ug | INTRAMUSCULAR | Status: DC | PRN

## 2023-10-27 MED ORDER — PHENYLEPHRINE 80 MCG/ML (10ML) SYRINGE FOR IV PUSH (FOR BLOOD PRESSURE SUPPORT)
PREFILLED_SYRINGE | INTRAVENOUS | Status: AC
  Filled 2023-10-27: qty 10

## 2023-10-27 MED ORDER — LACTATED RINGERS IV SOLN: INTRAVENOUS | Status: DC

## 2023-10-27 MED ORDER — EPHEDRINE 5 MG/ML INJ
INTRAVENOUS | Status: AC
  Filled 2023-10-27: qty 5

## 2023-10-27 MED ORDER — ONDANSETRON HCL 4 MG/2ML IJ SOLN: 4.0000 mg | Freq: Once | INTRAMUSCULAR | Status: DC | PRN

## 2023-10-27 MED ORDER — EPHEDRINE SULFATE-NACL 50-0.9 MG/10ML-% IV SOSY
PREFILLED_SYRINGE | INTRAVENOUS | Status: DC | PRN
Start: 2023-10-27 — End: 2023-10-27
  Administered 2023-10-27: 5 mg via INTRAVENOUS
  Administered 2023-10-27: 10 mg via INTRAVENOUS

## 2023-10-27 MED ORDER — OXYCODONE HCL 5 MG/5ML PO SOLN: 5.0000 mg | Freq: Once | ORAL | Status: AC | PRN

## 2023-10-27 MED ORDER — ORAL CARE MOUTH RINSE: 15.0000 mL | Freq: Once | OROMUCOSAL | Status: AC

## 2023-10-27 MED ORDER — ACETAMINOPHEN 10 MG/ML IV SOLN: 1000.0000 mg | Freq: Once | INTRAVENOUS | Status: DC | PRN

## 2023-10-27 MED ORDER — PROPOFOL 10 MG/ML IV BOLUS
INTRAVENOUS | Status: DC | PRN
  Administered 2023-10-27: 150 mg via INTRAVENOUS

## 2023-10-27 MED ORDER — LIDOCAINE 2% (20 MG/ML) 5 ML SYRINGE
INTRAMUSCULAR | Status: AC
  Filled 2023-10-27: qty 5

## 2023-10-27 SURGICAL SUPPLY — 34 items
BAG COUNTER SPONGE SURGICOUNT (BAG) ×2 IMPLANT
BNDG ELASTIC 4INX 5YD STR LF (GAUZE/BANDAGES/DRESSINGS) IMPLANT
BNDG ELASTIC 6INX 5YD STR LF (GAUZE/BANDAGES/DRESSINGS) IMPLANT
CANISTER SUCT 3000ML PPV (MISCELLANEOUS) ×2 IMPLANT
CANISTER WOUND CARE 500ML ATS (WOUND CARE) IMPLANT
CLEANSER WND VASHE INSTL 34OZ (WOUND CARE) IMPLANT
COVER SURGICAL LIGHT HANDLE (MISCELLANEOUS) IMPLANT
DRAPE DERMATAC (DRAPES) IMPLANT
DRAPE INCISE IOBAN 66X45 STRL (DRAPES) IMPLANT
DRSG VAC GRANUFOAM MED (GAUZE/BANDAGES/DRESSINGS) IMPLANT
DRSG VAC GRANUFOAM SM (GAUZE/BANDAGES/DRESSINGS) IMPLANT
ELECT REM PT RETURN 9FT ADLT (ELECTROSURGICAL) ×2 IMPLANT
ELECTRODE REM PT RTRN 9FT ADLT (ELECTROSURGICAL) ×2 IMPLANT
GAUZE SPONGE 4X4 12PLY STRL (GAUZE/BANDAGES/DRESSINGS) ×2 IMPLANT
GLOVE BIOGEL PI IND STRL 7.5 (GLOVE) ×2 IMPLANT
GLOVE SURG SS PI 7.5 STRL IVOR (GLOVE) ×2 IMPLANT
GOWN STRL REUS W/ TWL LRG LVL3 (GOWN DISPOSABLE) ×4 IMPLANT
GOWN STRL REUS W/ TWL XL LVL3 (GOWN DISPOSABLE) ×4 IMPLANT
GRAFT SKIN WND SURGICLOSE M95 (Tissue) IMPLANT
KIT BASIN OR (CUSTOM PROCEDURE TRAY) ×2 IMPLANT
KIT TURNOVER KIT B (KITS) ×2 IMPLANT
NS IRRIG 1000ML POUR BTL (IV SOLUTION) ×2 IMPLANT
PACK GENERAL/GYN (CUSTOM PROCEDURE TRAY) ×2 IMPLANT
PACK UNIVERSAL I (CUSTOM PROCEDURE TRAY) ×2 IMPLANT
PAD ARMBOARD 7.5X6 YLW CONV (MISCELLANEOUS) ×4 IMPLANT
PAD NEG PRESSURE SENSATRAC (MISCELLANEOUS) IMPLANT
STAPLER VISISTAT 35W (STAPLE) IMPLANT
SUT ETHILON 3 0 PS 1 (SUTURE) IMPLANT
SUT MNCRL AB 4-0 PS2 18 (SUTURE) IMPLANT
SUT VIC AB 2-0 CT1 TAPERPNT 27 (SUTURE) IMPLANT
SUT VIC AB 2-0 CTB1 (SUTURE) IMPLANT
SUT VIC AB 3-0 SH 27X BRD (SUTURE) IMPLANT
TOWEL GREEN STERILE (TOWEL DISPOSABLE) ×2 IMPLANT
WATER STERILE IRR 1000ML POUR (IV SOLUTION) ×2 IMPLANT

## 2023-10-27 NOTE — Progress Notes (Signed)
 VASCULAR AND VEIN SPECIALISTS OF [place] PROGRESS NOTE  ASSESSMENT / PLAN: Justin Wells is a 73 y.o. male  s/p: Extensive right CFA endarterectomy including EIA, profunda and SFA with bovine pericardial patch angioplasty and right CFA to AK popliteal artery bypass with PTFE 10/14/2023 by Dr. Randie Heinz  incision, drainage, and washout of right groin and right popliteal incision; initiation of negative pressure therapy to right groin and right popliteal incisions  10/23/2023   SUBJECTIVE: No complaints. Ready for OR.  OBJECTIVE: BP 120/67   Pulse (!) 58   Temp 98 F (36.7 C) (Oral)   Resp 18   Ht 5\' 9"  (1.753 m)   Wt 95.3 kg   SpO2 95%   BMI 31.01 kg/m   Intake/Output Summary (Last 24 hours) at 10/27/2023 0717 Last data filed at 10/27/2023 0400 Gross per 24 hour  Intake 1399.97 ml  Output 3385 ml  Net -1985.03 ml    Chronically ill. No distress Regular rate and rhythm Unlabored breathing VACs with good seal     Latest Ref Rng & Units 10/27/2023    3:12 AM 10/22/2023   12:30 PM 10/15/2023    7:50 AM  CBC  WBC 4.0 - 10.5 K/uL 8.8  8.1  8.6   Hemoglobin 13.0 - 17.0 g/dL 9.6  40.9  81.1   Hematocrit 39.0 - 52.0 % 28.7  29.2  35.5   Platelets 150 - 400 K/uL 362  303  171         Latest Ref Rng & Units 10/27/2023    3:12 AM 10/22/2023   12:30 PM 10/15/2023    7:50 AM  CMP  Glucose 70 - 99 mg/dL 914  782  956   BUN 8 - 23 mg/dL 6  23  10    Creatinine 0.61 - 1.24 mg/dL 2.13  0.86  5.78   Sodium 135 - 145 mmol/L 135  138  137   Potassium 3.5 - 5.1 mmol/L 4.1  4.2  3.5   Chloride 98 - 111 mmol/L 104  99  101   CO2 22 - 32 mmol/L 24  28  24    Calcium 8.9 - 10.3 mg/dL 8.1  8.5  8.4   Total Protein 6.5 - 8.1 g/dL  5.8    Total Bilirubin 0.0 - 1.2 mg/dL  0.9    Alkaline Phos 38 - 126 U/L  65    AST 15 - 41 U/L  33    ALT 0 - 44 U/L  26      Estimated Creatinine Clearance: 79.9 mL/min (by C-G formula based on SCr of 0.98 mg/dL).  Rande Brunt. Lenell Antu, MD [facility] Vascular and  Vein Specialists of [facility] Phone Number: [phone] 10/27/2023 7:17 AM

## 2023-10-27 NOTE — Plan of Care (Signed)
  Problem: Education: Goal: Knowledge of General Education information will improve Description: Including pain rating scale, medication(s)/side effects and non-pharmacologic comfort measures Outcome: Progressing   Problem: Health Behavior/Discharge Planning: Goal: Ability to manage health-related needs will improve Outcome: Progressing   Problem: Clinical Measurements: Goal: Ability to maintain clinical measurements within normal limits will improve Outcome: Progressing Goal: Will remain free from infection Outcome: Progressing Goal: Diagnostic test results will improve Outcome: Progressing Goal: Respiratory complications will improve Outcome: Progressing Goal: Cardiovascular complication will be avoided Outcome: Progressing   Problem: Activity: Goal: Risk for activity intolerance will decrease Outcome: Progressing   Problem: Nutrition: Goal: Adequate nutrition will be maintained Outcome: Progressing   Problem: Coping: Goal: Level of anxiety will decrease Outcome: Progressing   Problem: Elimination: Goal: Will not experience complications related to bowel motility Outcome: Progressing Goal: Will not experience complications related to urinary retention Outcome: Progressing   Problem: Pain Managment: Goal: General experience of comfort will improve and/or be controlled Outcome: Progressing   Problem: Safety: Goal: Ability to remain free from injury will improve Outcome: Progressing   Problem: Skin Integrity: Goal: Risk for impaired skin integrity will decrease Outcome: Progressing   Problem: Education: Goal: Required Educational Video(s) Outcome: Progressing   Problem: Clinical Measurements: Goal: Postoperative complications will be avoided or minimized Outcome: Progressing   Problem: Skin Integrity: Goal: Demonstration of wound healing without infection will improve Outcome: Progressing

## 2023-10-27 NOTE — Op Note (Signed)
 DATE OF SERVICE: 10/27/2023  PATIENT:  Justin Wells  73 y.o. male  PRE-OPERATIVE DIAGNOSIS:  E. Coli infected seroma in right groin; hematoma in right popliteal incision after fem-pop bypass  POST-OPERATIVE DIAGNOSIS:  Same  PROCEDURE:   1) washout of wounds of right leg 2) simple partial closure of right groin and right popliteal wounds (12x4x2 total volume)  3) application of skin substitute to wounds of right leg  4) application of negative pressure wound therapy to wounds of right leg (12x4x2 total volume)  SURGEON:  Surgeons and Role:    * Leonie Douglas, MD - Primary  ASSISTANT: none  ANESTHESIA:   general  EBL: minimal  BLOOD ADMINISTERED:none  DRAINS: VAC to groin and VAC to popliteal wound   LOCAL MEDICATIONS USED:  NONE  SPECIMEN:  none  COUNTS: confirmed correct.  TOURNIQUET:  none  PATIENT DISPOSITION:  PACU - hemodynamically stable.   Delay start of Pharmacological VTE agent (>24hrs) due to surgical blood loss or risk of bleeding: no  INDICATION FOR PROCEDURE: LATHANIEL LEGATE is a 73 y.o. 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  I washed out the wounds last week, and took cultures.  These cultures have grown E. coli.  My partners and I discussed this case in detail, we agreed and his primary surgeon preferred to try a nonoperative approach for now. After careful discussion of risks, benefits, and alternatives the patient was offered repeat washout.  Counseled the patient about the risk of an E. coli infection, and the possibility of disruption of the vascular reconstruction.  The patient understood and wished to proceed.  OPERATIVE FINDINGS: The vascular anastomoses appeared well incorporated.  They did not bleed when probed.  The wounds appeared clean.  DESCRIPTION OF PROCEDURE: After identification of the patient in the pre-operative holding area, the patient was transferred to the operating room. The patient was  positioned supine on the operating room table. Anesthesia was induced. The right leg was prepped and draped in standard fashion. A surgical pause was performed confirming correct patient, procedure, and operative location.  Both wounds were carefully inspected.  Healthy tissue was seen throughout.  The wounds were irrigated with Vashe solution.  No debridement was necessary.  The vascular structures were partially covered by healing tissue.  These did not bleed when probed.  Kerecis powder was applied to both wound beds.  I reapproximated some of the subcutaneous tissue with 2-0 Vicryl suture to decrease the wound volume in both beds.  A black sponge was applied to the wounds and connected to a vacuum pump.  Total wound volume measured 12 x 4 x 2 cm.  The leg was wrapped with Ace wrap  Upon completion of the case instrument and sharps counts were confirmed correct. The patient was transferred to the PACU in good condition. I was present for all portions of the procedure.  FOLLOW UP PLAN: Continue VAC therapy to groins.    Rande Brunt. Lenell Antu, MD [facility] Vascular and Vein Specialists of [facility] Phone Number: [phone] 10/27/2023 8:31 AM

## 2023-10-27 NOTE — Anesthesia Postprocedure Evaluation (Signed)
 Anesthesia Post Note  Patient: Justin Wells  Procedure(s) Performed: Drucie Ip DEBRIDEMENT (Right: Groin) INCISION AND DRAINAGE POPLITEAL INCISION (Right: Thigh)     Patient location during evaluation: PACU Anesthesia Type: General Level of consciousness: awake and alert Pain management: pain level controlled Vital Signs Assessment: post-procedure vital signs reviewed and stable Respiratory status: spontaneous breathing, nonlabored ventilation, respiratory function stable and patient connected to nasal cannula oxygen Cardiovascular status: blood pressure returned to baseline and stable Postop Assessment: no apparent nausea or vomiting Anesthetic complications: no   No notable events documented.  Last Vitals:  Vitals:   10/27/23 0915 10/27/23 1001  BP: 128/73 129/61  Pulse: 61   Resp: 11 14  Temp: 36.6 C 36.6 C  SpO2: 100% 98%    Last Pain:  Vitals:   10/27/23 1001  TempSrc: Oral  PainSc:                  Mariann Barter

## 2023-10-27 NOTE — Transfer of Care (Signed)
 Immediate Anesthesia Transfer of Care Note  Patient: Justin Wells  Procedure(s) Performed: Drucie Ip DEBRIDEMENT (Right) INCISION AND DRAINAGE POPLITEAL INCISION (Right)  Patient Location: PACU  Anesthesia Type:General  Level of Consciousness: awake, alert , and patient cooperative  Airway & Oxygen Therapy: Patient Spontanous Breathing and Patient connected to nasal cannula oxygen  Post-op Assessment: Report given to RN and Post -op Vital signs reviewed and stable  Post vital signs: Reviewed and stable  Last Vitals:  Vitals Value Taken Time  BP 135/65 10/27/23 0830  Temp 98.7   Pulse 63 10/27/23 0831  Resp 13 10/27/23 0831  SpO2 99 % 10/27/23 0831  Vitals shown include unfiled device data.  Last Pain:  Vitals:   10/27/23 0651  TempSrc:   PainSc: 7       Patients Stated Pain Goal: 0 (10/27/23 0651)  Complications: No notable events documented.

## 2023-10-27 NOTE — Anesthesia Preprocedure Evaluation (Signed)
 Anesthesia Evaluation  Patient identified by MRN, date of birth, ID band Patient awake    Reviewed: Allergy & Precautions, NPO status , Patient's Chart, lab work & pertinent test results, reviewed documented beta blocker date and time   History of Anesthesia Complications Negative for: history of anesthetic complications  Airway Mallampati: II  TM Distance: >3 FB     Dental  (+) Edentulous Upper, Edentulous Lower   Pulmonary COPD, former smoker   breath sounds clear to auscultation       Cardiovascular Exercise Tolerance: Poor hypertension, (-) angina + CAD, + Past MI, + Peripheral Vascular Disease and +CHF  (-) Cardiac Stents  Rhythm:Regular Rate:Normal     Neuro/Psych  Headaches, Seizures -, Well Controlled,  PSYCHIATRIC DISORDERS Anxiety Depression    TIACVA, No Residual Symptoms    GI/Hepatic hiatal hernia,GERD  ,,(+) neg Cirrhosis        Endo/Other    Renal/GU Renal disease     Musculoskeletal  (+) Arthritis ,    Abdominal   Peds  Hematology  (+) Blood dyscrasia, anemia   Anesthesia Other Findings   Reproductive/Obstetrics                              Anesthesia Physical Anesthesia Plan  ASA: 3  Anesthesia Plan: General   Post-op Pain Management:    Induction: Intravenous  PONV Risk Score and Plan: 2 and Ondansetron and Dexamethasone  Airway Management Planned: LMA  Additional Equipment:   Intra-op Plan:   Post-operative Plan: Extubation in OR  Informed Consent: I have reviewed the patients History and Physical, chart, labs and discussed the procedure including the risks, benefits and alternatives for the proposed anesthesia with the patient or authorized representative who has indicated his/her understanding and acceptance.     Dental advisory given  Plan Discussed with: CRNA  Anesthesia Plan Comments:          Anesthesia Quick Evaluation

## 2023-10-27 NOTE — Anesthesia Procedure Notes (Signed)
 Procedure Name: LMA Insertion Date/Time: 10/27/2023 7:36 AM  Performed by: Colbert Coyer, CRNAPre-anesthesia Checklist: Patient identified, Emergency Drugs available, Suction available and Patient being monitored Patient Re-evaluated:Patient Re-evaluated prior to induction Oxygen Delivery Method: Circle System Utilized Preoxygenation: Pre-oxygenation with 100% oxygen Induction Type: IV induction Ventilation: Mask ventilation without difficulty LMA: LMA inserted LMA Size: 4.0 Number of attempts: 1 Placement Confirmation: positive ETCO2 Dental Injury: Teeth and Oropharynx as per pre-operative assessment

## 2023-10-28 ENCOUNTER — Encounter (HOSPITAL_COMMUNITY): Payer: Self-pay | Admitting: Vascular Surgery

## 2023-10-28 LAB — AEROBIC/ANAEROBIC CULTURE W GRAM STAIN (SURGICAL/DEEP WOUND): Gram Stain: NONE SEEN

## 2023-10-28 LAB — GLUCOSE, CAPILLARY: Glucose-Capillary: 90 mg/dL (ref 70–99)

## 2023-10-28 MED ORDER — CIPROFLOXACIN HCL 500 MG PO TABS
500.0000 mg | ORAL_TABLET | Freq: Two times a day (BID) | ORAL | Status: DC
  Administered 2023-10-28 – 2023-10-31 (×7): 500 mg via ORAL
  Filled 2023-10-28 (×7): qty 1

## 2023-10-28 NOTE — Progress Notes (Addendum)
  Progress Note    10/28/2023 7:48 AM 1 Day Post-Op  Subjective:  right leg sore   Vitals:   10/27/23 1933 10/27/23 2314  BP: 102/70 118/63  Pulse:  65  Resp: 13 19  Temp: 99 F (37.2 C) 98 F (36.7 C)  SpO2:  97%   Physical Exam: Cardiac:  regular Lungs:  non labored Incisions:  Right groin VAC and popliteal incision with good seal Extremities: RLE well perfused and warm. ACE in place to help with edema Neurologic: alert and oriented  CBC    Component Value Date/Time   WBC 8.8 10/27/2023 0312   RBC 3.27 (L) 10/27/2023 0312   HGB 9.6 (L) 10/27/2023 0312   HCT 28.7 (L) 10/27/2023 0312   PLT 362 10/27/2023 0312   MCV 87.8 10/27/2023 0312   MCH 29.4 10/27/2023 0312   MCHC 33.4 10/27/2023 0312   RDW 16.4 (H) 10/27/2023 0312   LYMPHSABS 1.8 05/08/2016 0416   MONOABS 0.8 05/08/2016 0416   EOSABS 0.4 05/08/2016 0416   BASOSABS 0.1 05/08/2016 0416    BMET    Component Value Date/Time   NA 135 10/27/2023 0312   K 4.1 10/27/2023 0312   CL 104 10/27/2023 0312   CO2 24 10/27/2023 0312   GLUCOSE 117 (H) 10/27/2023 0312   BUN 6 (L) 10/27/2023 0312   CREATININE 0.98 10/27/2023 0312   CALCIUM 8.1 (L) 10/27/2023 0312   GFRNONAA >60 10/27/2023 0312   GFRAA >60 07/18/2016 1238    INR    Component Value Date/Time   INR 1.2 10/22/2023 1230     Intake/Output Summary (Last 24 hours) at 10/28/2023 0748 Last data filed at 10/28/2023 0645 Gross per 24 hour  Intake 849.88 ml  Output 2855 ml  Net -2005.12 ml     Assessment/Plan:  73 y.o. male is s/p 1) washout of wounds of right leg 2) simple partial closure of right groin and right popliteal wounds (12x4x2 total volume)  3) application of skin substitute to wounds of right leg  4) application of negative pressure wound therapy to wounds of right leg (12x4x2 total volume) 1 Day Post-Op   RLE remains well perfused and warm Right groin and popliteal incisions with VAC to suction Continue ACE to right leg Multimodal  pain control Cx grew ecoli. Currently on Zosyn. Will transition to Cipro 500 mg BID  Ambulate as tolerated Will defer to Dr. Randie Heinz regarding [facility] change timing   Graceann Congress, PA-C [facility] Vein Specialists [phone] 10/28/2023 7:48 AM  I have independently interviewed and patient and agree with PA assessment and plan above.  Edema of right lower extremity improving.  Continue antibiotics.  VAC change on Thursday and will plan to set up for home if wounds appear to be progressing  Mailen Newborn C. Randie Heinz, MD Vascular and Vein Specialists of [place] Office: [phone] Pager: [phone]

## 2023-10-28 NOTE — Progress Notes (Signed)
 Mobility Specialist Progress Note:    10/28/23 1441  Mobility  Activity Ambulated with assistance in hallway;Ambulated with assistance in room  Level of Assistance Contact guard assist, steadying assist  Assistive Device Front wheel walker  Distance Ambulated (ft) 600 ft  Activity Response Tolerated well  Mobility Referral Yes  Mobility visit 1 Mobility  Mobility Specialist Start Time (ACUTE ONLY) 1430  Mobility Specialist Stop Time (ACUTE ONLY) 1441  Mobility Specialist Time Calculation (min) (ACUTE ONLY) 11 min   Pt received in chair, agreeable to mobility session. Ambulated in hallway with RW and CGA for safety. Tolerated well, VSS throughout. C/o fatigue half way throughout. Returned pt to room, lying comfortably in bed, all needs met.    Feliciana Rossetti Mobility Specialist Please contact via Special educational needs teacher or  Rehab office at [phone]

## 2023-10-28 NOTE — TOC Initial Note (Signed)
 Transition of Care (TOC) - Initial/Assessment Note  Donn Pierini RN, BSN Transitions of Care Unit 4E- RN Case Manager See Treatment Team for direct phone #   Patient Details  Name: Justin Wells MRN: [id number] Date of Birth: 12-May-1951  Transition [facility]) CM/SW Contact:    Darrold Span, RN Phone Number: 10/28/2023, 1:12 PM  Clinical Narrative:                 Pt admitted from home w/ wife, infected vascular bypass graft. S/p I&D with wound VAC placement.   Home wound VAC order form has been signed and faxed to [employer] liaison to start insurance auth for home [facility] needs.   CM notified by [facility] liaison that they received referral from VVS office for [facility] needs- liaison following   TOC will continue to follow for coordination of discharge needs.     Expected Discharge Plan: Home w Home Health Services Barriers to Discharge: Continued Medical Work up   Patient Goals and CMS Choice Patient states their goals for this hospitalization and ongoing recovery are:: return home   Choice offered to / list presented to : Patient      Expected Discharge Plan and Services   Discharge Planning Services: CM Consult Post Acute Care Choice: Home Health, Durable Medical Equipment Living arrangements for the past 2 months: Single Family Home                 DME Arranged: Vac DME Agency: [employer] Date DME Agency Contacted: 10/28/23 Time DME Agency Contacted: 1312 Representative spoke with at DME Agency: [place] HH Arranged: RN HH Agency: [facility]     Representative spoke with at [facility] Agency: Aggie Cosier  Prior Living Arrangements/Services Living arrangements for the past 2 months: Single Family Home Lives with:: Spouse Patient language and need for interpreter reviewed:: Yes Do you feel safe going back to the place where you live?: Yes      Need for Family Participation in Patient Care: Yes (Comment) Care giver support system in place?: Yes  (comment) Current home services: DME (RW) Criminal Activity/Legal Involvement Pertinent to Current Situation/Hospitalization: No - Comment as needed  Activities of Daily Living   ADL Screening (condition at time of admission) Independently performs ADLs?: Yes (appropriate for developmental age) Is the patient deaf or have difficulty hearing?: No Does the patient have difficulty seeing, even when wearing glasses/contacts?: No Does the patient have difficulty concentrating, remembering, or making decisions?: No  Permission Sought/Granted Permission sought to share information with : Facility Medical sales representative                Emotional Assessment Appearance:: Appears stated age     Orientation: : Oriented to Self, Oriented to Place, Oriented to  Time, Oriented to Situation Alcohol / Substance Use: Not Applicable Psych Involvement: No (comment)  Admission diagnosis:  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] Patient Active Problem List   Diagnosis Date Noted   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   Atherosclerosis of native arteries of extremity with rest pain (HCC) 10/14/2023   Rest pain of lower extremity due to atherosclerosis (HCC) 10/14/2023   NAFLD (nonalcoholic fatty liver disease) 95/62/1308   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   Pulmonary emphysema (HCC) 10/28/2018   Pancreatic insufficiency 05/05/2017   SVT (supraventricular tachycardia) (HCC) 12/10/2016   Focal motor seizure disorder (HCC)    Calculus of gallbladder without cholecystitis without obstruction 05/05/2016   Epigastric pain    Spells  Barrett's esophagus with dysplasia 07/20/2015   Decreased responsiveness 04/12/2015   Seizure (HCC)    Acute prostatitis 06/20/2014   Urinary urgency 06/20/2014   Anxiety 04/12/2014   Chronic cervical pain 04/12/2014   Panic attack 04/12/2014   Chest pain 03/08/2014   PAD (peripheral artery disease) (HCC) 03/08/2014   Anxiety state  03/08/2014   Chronic pain syndrome 03/08/2014   TIA (transient ischemic attack) 03/07/2014   Paresthesia 01/06/2014   Temporary cerebral vascular dysfunction 01/06/2014   Cerebral infarct (HCC) 01/06/2014   Cerebral infarction (HCC) 12/29/2013   Hypertension 12/28/2013   Other and unspecified hyperlipidemia 12/28/2013   Brainstem infarct not seen on MRI 12/27/2013   Bad memory 11/18/2013   Special screening for malignant neoplasm of prostate 11/18/2013   Ache in joint 07/12/2013   Clinical depression 03/09/2013   Generalized OA 03/09/2013   Numbness and tingling 01/12/2013   Peripheral vascular disease, unspecified (HCC) 01/12/2013   Pain in limb 01/12/2013   Atherosclerosis of native artery of extremity with intermittent claudication (HCC) 01/12/2013   Hypoglycemia 11/05/2012   Atherosclerosis of native artery of extremity (HCC) 11/05/2012   Atherosclerosis of coronary artery 07/04/2011   Artery disease, cerebral 07/04/2011   Coronary artery disease involving native coronary artery of native heart without angina pectoris 07/04/2011   Acid reflux 05/31/2010   PCP:  John Giovanni, MD Pharmacy:   [facility] [phone] - [place], [place] - [address] AT [facility] [address] [place] [address] Phone: [phone] Fax: [phone]     Social Drivers of Health (SDOH) Social History: SDOH Screenings   Food Insecurity: No Food Insecurity (10/22/2023)  Housing: Low Risk  (10/22/2023)  Transportation Needs: No Transportation Needs (10/22/2023)  Utilities: Not At Risk (10/22/2023)  Financial Resource Strain: Low Risk  (04/05/2020)   [facility], [facility]  Physical Activity: Inactive (04/05/2020)   [facility] from [facility], [facility]  Social Connections: Socially Integrated (10/22/2023)  Stress: No Stress Concern Present (04/05/2020)   [facility] from [facility], [facility]  Tobacco Use: Medium Risk (10/27/2023)  Health  Literacy: Low Risk  (04/05/2020)   Received from [facility], [facility]   SDOH Interventions:     Readmission Risk Interventions    10/17/2023    4:21 PM  Readmission Risk Prevention Plan  Transportation Screening Complete  Home Care Screening Complete  Medication Review (RN CM) Complete

## 2023-10-28 NOTE — Plan of Care (Signed)

## 2023-10-29 NOTE — Plan of Care (Signed)
  Problem: Education: Goal: Knowledge of General Education information will improve Description: Including pain rating scale, medication(s)/side effects and non-pharmacologic comfort measures Outcome: Progressing   Problem: Health Behavior/Discharge Planning: Goal: Ability to manage health-related needs will improve Outcome: Progressing   Problem: Clinical Measurements: Goal: Ability to maintain clinical measurements within normal limits will improve Outcome: Progressing Goal: Will remain free from infection Outcome: Progressing Goal: Diagnostic test results will improve Outcome: Progressing Goal: Respiratory complications will improve Outcome: Progressing Goal: Cardiovascular complication will be avoided Outcome: Progressing   Problem: Activity: Goal: Risk for activity intolerance will decrease Outcome: Progressing   Problem: Nutrition: Goal: Adequate nutrition will be maintained Outcome: Progressing   Problem: Coping: Goal: Level of anxiety will decrease Outcome: Progressing   Problem: Elimination: Goal: Will not experience complications related to bowel motility Outcome: Progressing Goal: Will not experience complications related to urinary retention Outcome: Progressing   Problem: Pain Managment: Goal: General experience of comfort will improve and/or be controlled Outcome: Progressing   Problem: Safety: Goal: Ability to remain free from injury will improve Outcome: Progressing   Problem: Skin Integrity: Goal: Risk for impaired skin integrity will decrease Outcome: Progressing   Problem: Education: Goal: Required Educational Video(s) Outcome: Progressing   Problem: Clinical Measurements: Goal: Postoperative complications will be avoided or minimized Outcome: Progressing   Problem: Skin Integrity: Goal: Demonstration of wound healing without infection will improve Outcome: Progressing

## 2023-10-29 NOTE — Progress Notes (Signed)
 Wound VAC on R groin started leaking. Performed wet to dry dressing per PA order.   Lawson Radar, RN

## 2023-10-29 NOTE — TOC Progression Note (Addendum)
 Transition of Care (TOC) - Progression Note  Donn Pierini RN, BSN Transitions of Care Unit 4E- RN Case Manager See Treatment Team for direct phone #   Patient Details  Name: Justin Wells MRN: [id number] Date of Birth: May 17, 1951  Transition of Care [facility]) CM/SW Contact  Zenda Alpers, Lenn Sink, RN Phone Number: 10/29/2023, 12:19 PM  Clinical Narrative:    CM received msg from [facility] liaison that home [facility] has been approved- liaison to send POD for delivery when pt medically ready for discharge.   [facility] following for [facility] needs- pt needs order for [facility] VAC drsg changes needs. - have confirmed with liaison that [place] branch can service- start of care would be for Monday- 11/03/23  TOC to continue to follow   Expected Discharge Plan: Home w Home Health Services Barriers to Discharge: Continued Medical Work up  Expected Discharge Plan and Services   Discharge Planning Services: CM Consult Post Acute Care Choice: Home Health, Durable Medical Equipment Living arrangements for the past 2 months: Single Family Home                 DME Arranged: Vac DME Agency: [employer] Date DME Agency Contacted: 10/28/23 Time DME Agency Contacted: 1312 Representative spoke with at DME Agency: French Ana HH Arranged: RN HH Agency: [facility]     Representative spoke with at [facility] Agency: Aggie Cosier   Social Determinants of Health (SDOH) Interventions SDOH Screenings   Food Insecurity: No Food Insecurity (10/22/2023)  Housing: Low Risk  (10/22/2023)  Transportation Needs: No Transportation Needs (10/22/2023)  Utilities: Not At Risk (10/22/2023)  Financial Resource Strain: Low Risk  (04/05/2020)   Received from [facility], [facility]  Physical Activity: Inactive (04/05/2020)   Received from [facility], [facility]  Social Connections: Socially Integrated (10/22/2023)  Stress: No Stress Concern Present (04/05/2020)   Received from [facility], [facility]  Tobacco  Use: Medium Risk (10/27/2023)  Health Literacy: Low Risk  (04/05/2020)   Received from [facility], [facility]    Readmission Risk Interventions    10/17/2023    4:21 PM  Readmission Risk Prevention Plan  Transportation Screening Complete  Home Care Screening Complete  Medication Review (RN CM) Complete

## 2023-10-29 NOTE — Progress Notes (Addendum)
  Progress Note    10/29/2023 7:45 AM 2 Days Post-Op  Subjective:  no complaints. Says he is feeling 110% better. Right leg pain much improved   Vitals:   10/28/23 2304 10/29/23 0322  BP: 112/66 (!) 114/58  Pulse: 63 60  Resp: 20 15  Temp: 98 F (36.7 C) 98 F (36.7 C)  SpO2: 95% 97%   Physical Exam: Cardiac:  regular Lungs:  non labored Incisions:  Right groin, right popliteal incisions with wound VACs, good seal. SS output in canisters Extremities:  RLE well perfused and warm with doppler DP and PT signals. Edema improving Neurologic: alert and oriented  CBC    Component Value Date/Time   WBC 8.8 10/27/2023 0312   RBC 3.27 (L) 10/27/2023 0312   HGB 9.6 (L) 10/27/2023 0312   HCT 28.7 (L) 10/27/2023 0312   PLT 362 10/27/2023 0312   MCV 87.8 10/27/2023 0312   MCH 29.4 10/27/2023 0312   MCHC 33.4 10/27/2023 0312   RDW 16.4 (H) 10/27/2023 0312   LYMPHSABS 1.8 05/08/2016 0416   MONOABS 0.8 05/08/2016 0416   EOSABS 0.4 05/08/2016 0416   BASOSABS 0.1 05/08/2016 0416    BMET    Component Value Date/Time   NA 135 10/27/2023 0312   K 4.1 10/27/2023 0312   CL 104 10/27/2023 0312   CO2 24 10/27/2023 0312   GLUCOSE 117 (H) 10/27/2023 0312   BUN 6 (L) 10/27/2023 0312   CREATININE 0.98 10/27/2023 0312   CALCIUM 8.1 (L) 10/27/2023 0312   GFRNONAA >60 10/27/2023 0312   GFRAA >60 07/18/2016 1238    INR    Component Value Date/Time   INR 1.2 10/22/2023 1230     Intake/Output Summary (Last 24 hours) at 10/29/2023 0745 Last data filed at 10/29/2023 0500 Gross per 24 hour  Intake 360 ml  Output 2800 ml  Net -2440 ml     Assessment/Plan:  73 y.o. male is s/p 1) washout of wounds of right leg 2) simple partial closure of right groin and right popliteal wounds (12x4x2 total volume)  3) application of skin substitute to wounds of right leg  4) application of negative pressure wound therapy to wounds of right leg (12x4x2 total volume)  2 Days Post-Op   Right groin  and popliteal incisions with VAC to suction. SS output in canisters RLE with Doppler DP and PT signals Pain well managed  On Cipro. Plan was for 5 days but with PTFE and e.coli will plan to d/c on 2 week course Elevate leg in bed or chair. ACE can come off later today. Edema is improving Okay to mobilize as tolerated Plan is to change VACs at bedside tomorrow Once Home [facility] arranged possible d/c tomorrow vs Friday  Graceann Congress, [place] [facility] [phone] 10/29/2023 7:45 AM   I have interviewed and examined patient with PA and agree with assessment and plan above.  Wound VAC change tomorrow at bedside we will plan for home VAC as long as healing as expected.  Tanush Drees C. Randie Heinz, MD [facility] [place] Office: [phone] Pager: [phone]

## 2023-10-29 NOTE — Plan of Care (Signed)

## 2023-10-29 NOTE — Progress Notes (Signed)
 Mobility Specialist Progress Note:    10/29/23 1427  Mobility  Activity Ambulated with assistance in hallway;Ambulated with assistance in room  Level of Assistance Contact guard assist, steadying assist  Assistive Device Front wheel walker  Distance Ambulated (ft) 100 ft  Activity Response Tolerated well  Mobility Referral Yes  Mobility visit 1 Mobility  Mobility Specialist Start Time (ACUTE ONLY) 1415  Mobility Specialist Stop Time (ACUTE ONLY) 1427  Mobility Specialist Time Calculation (min) (ACUTE ONLY) 12 min   Pt received in chair, agreeable to mobility session. Ambulated with RW and MinG for safety. Tolerated well, c/o diarrhea and LE pain. Deferred further mobility d/t wound vac leaking. Returned pt to room, sitting up in chair. RN addressing leak. Left with all needs met.  Feliciana Rossetti Mobility Specialist Please contact via Special educational needs teacher or  Rehab office at [phone]

## 2023-10-30 NOTE — Progress Notes (Addendum)
  Progress Note    10/30/2023 8:20 AM 3 Days Post-Op  Subjective:  no complaints   Vitals:   10/30/23 0333 10/30/23 0804  BP: 110/60 131/74  Pulse: 60 (!) 57  Resp: 20 11  Temp: 97.8 F (36.6 C) 97.7 F (36.5 C)  SpO2: 100% 100%   Physical Exam: Cardiac:  regular Lungs:  non labored Incisions:  Right groin and right popliteal incisions well appearing as shown below. Healthy granulation tissue in wound bed. Wound VACs with black sponge re applied with good seal    Extremities:  RLE well perfused and warm Neurologic: alert and oriented  CBC    Component Value Date/Time   WBC 8.8 10/27/2023 0312   RBC 3.27 (L) 10/27/2023 0312   HGB 9.6 (L) 10/27/2023 0312   HCT 28.7 (L) 10/27/2023 0312   PLT 362 10/27/2023 0312   MCV 87.8 10/27/2023 0312   MCH 29.4 10/27/2023 0312   MCHC 33.4 10/27/2023 0312   RDW 16.4 (H) 10/27/2023 0312   LYMPHSABS 1.8 05/08/2016 0416   MONOABS 0.8 05/08/2016 0416   EOSABS 0.4 05/08/2016 0416   BASOSABS 0.1 05/08/2016 0416    BMET    Component Value Date/Time   NA 135 10/27/2023 0312   K 4.1 10/27/2023 0312   CL 104 10/27/2023 0312   CO2 24 10/27/2023 0312   GLUCOSE 117 (H) 10/27/2023 0312   BUN 6 (L) 10/27/2023 0312   CREATININE 0.98 10/27/2023 0312   CALCIUM 8.1 (L) 10/27/2023 0312   GFRNONAA >60 10/27/2023 0312   GFRAA >60 07/18/2016 1238    INR    Component Value Date/Time   INR 1.2 10/22/2023 1230     Intake/Output Summary (Last 24 hours) at 10/30/2023 0820 Last data filed at 10/30/2023 1610 Gross per 24 hour  Intake 880 ml  Output 1475 ml  Net -595 ml     Assessment/Plan:  73 y.o. male is s/p 1) washout of wounds of right leg 2) simple partial closure of right groin and right popliteal wounds (12x4x2 total volume)  3) application of skin substitute to wounds of right leg  4) application of negative pressure wound therapy to wounds of right leg (12x4x2 total volume)  3 Days Post-Op   RLE remains well perfused and  warm Continue pain management VACs changed at bedside this morning Wounds healthy appearing Plan will be to arrange [facility] RN for 3x/week VAC changes Orders placed. Care to start 11/03/23 Will remain on Cipro at d/c Likely home tomorrow when [facility] and [facility] arranged  Graceann Congress, PA-C [facility] [phone] 10/30/2023 8:20 AM.  I have independently interviewed and examined patient and agree with PA assessment and plan above. Ok for home wound vac. Will continue po cipro.   Verlisa Vara C. Randie Heinz, MD [facility] [place] Office: [phone] Pager: [phone]

## 2023-10-30 NOTE — TOC Transition Note (Signed)
 Transition of Care [facility]) - Discharge Note Donn Pierini RN, BSN Transitions of Care Unit 4E- RN Case Manager See Treatment Team for direct phone #   Patient Details  Name: Justin Wells MRN: [id number] Date of Birth: March 08, 1951  Transition of Care [facility]) CM/SW Contact:  Darrold Span, RN Phone Number: 10/30/2023, 3:06 PM   Clinical Narrative:    Per MD anticipate discharge in the am,  Pt has been approved for home wound VAC and POD has been emailed to this Clinical research associate.  Home wound VAC delivered to pt at the bedside- with signed  POD faxed back to [facility] liaison, copy of POD placed on chart, original provided to pt.   HHRN set up with [facility]- for start of care 3/3- liaison updated on discharge plan for tomorrow.   Pt updated on [facility] and voiced understanding that [facility] will call to schedule visit for 3/3. Should pt have any trouble with home VAC before HH start of care he will need to call [facility] office or [facility] technical support- pt voiced understanding.   Wife will transport home.  Pt voiced he has no DME needs- has RW at home.   No further TOC needs noted.    Final next level of care: Home w Home Health Services Barriers to Discharge: Barriers Resolved   Patient Goals and CMS Choice Patient states their goals for this hospitalization and ongoing recovery are:: return home   Choice offered to / list presented to : Patient      Discharge Placement               Home w/ [facility]        Discharge Plan and Services Additional resources added to the After Visit Summary for     Discharge Planning Services: CM Consult Post Acute Care Choice: Home Health, Durable Medical Equipment          DME Arranged: Vac DME Agency: [employer] Date DME Agency Contacted: 10/28/23 Time DME Agency Contacted: 1312 Representative spoke with at DME Agency: French Ana HH Arranged: RN HH Agency: [facility]     Representative spoke with at [facility] Agency: Aggie Cosier  Social Drivers of  Health (SDOH) Interventions SDOH Screenings   Food Insecurity: No Food Insecurity (10/22/2023)  Housing: Low Risk  (10/22/2023)  Transportation Needs: No Transportation Needs (10/22/2023)  Utilities: Not At Risk (10/22/2023)  Financial Resource Strain: Low Risk  (04/05/2020)   Received from [facility], [facility]  Physical Activity: Inactive (04/05/2020)   Received from [facility], [facility]  Social Connections: Socially Integrated (10/22/2023)  Stress: No Stress Concern Present (04/05/2020)   Received from [facility], [facility]  Tobacco Use: Medium Risk (10/27/2023)  Health Literacy: Low Risk  (04/05/2020)   Received from [facility], [facility]     Readmission Risk Interventions    10/30/2023    3:05 PM 10/17/2023    4:21 PM  Readmission Risk Prevention Plan  Post Dischage Appt Complete   Medication Screening Complete   Transportation Screening Complete Complete  Home Care Screening  Complete  Medication Review (RN CM)  Complete

## 2023-10-31 ENCOUNTER — Other Ambulatory Visit (HOSPITAL_COMMUNITY): Payer: Self-pay

## 2023-10-31 MED ORDER — CIPROFLOXACIN HCL 500 MG PO TABS
500.0000 mg | ORAL_TABLET | Freq: Two times a day (BID) | ORAL | 0 refills | Status: DC
  Filled 2023-10-31: qty 22, 11d supply, fill #0

## 2023-10-31 NOTE — Progress Notes (Addendum)
  Progress Note    10/31/2023 8:06 AM 4 Days Post-Op  Subjective:  some right groin pain but overall feeling good. Ready to go home. Sitting up in chair waiting on his breakfast   Vitals:   10/30/23 2313 10/31/23 0348  BP: 107/62 118/60  Pulse: 60 61  Resp: 12 11  Temp: 97.9 F (36.6 C) 97.8 F (36.6 C)  SpO2: 98% 98%   Physical Exam: Cardiac:  regular Lungs:  non labored Incisions:  Right groin and popliteal incisions with VAC to suction Extremities:  RLE edematous. RLE remains well perfused with doppler DP/ Pero/ PT signals Neurologic: alert and oriented  CBC    Component Value Date/Time   WBC 8.8 10/27/2023 0312   RBC 3.27 (L) 10/27/2023 0312   HGB 9.6 (L) 10/27/2023 0312   HCT 28.7 (L) 10/27/2023 0312   PLT 362 10/27/2023 0312   MCV 87.8 10/27/2023 0312   MCH 29.4 10/27/2023 0312   MCHC 33.4 10/27/2023 0312   RDW 16.4 (H) 10/27/2023 0312   LYMPHSABS 1.8 05/08/2016 0416   MONOABS 0.8 05/08/2016 0416   EOSABS 0.4 05/08/2016 0416   BASOSABS 0.1 05/08/2016 0416    BMET    Component Value Date/Time   NA 135 10/27/2023 0312   K 4.1 10/27/2023 0312   CL 104 10/27/2023 0312   CO2 24 10/27/2023 0312   GLUCOSE 117 (H) 10/27/2023 0312   BUN 6 (L) 10/27/2023 0312   CREATININE 0.98 10/27/2023 0312   CALCIUM 8.1 (L) 10/27/2023 0312   GFRNONAA >60 10/27/2023 0312   GFRAA >60 07/18/2016 1238    INR    Component Value Date/Time   INR 1.2 10/22/2023 1230     Intake/Output Summary (Last 24 hours) at 10/31/2023 0806 Last data filed at 10/31/2023 0700 Gross per 24 hour  Intake --  Output 1020 ml  Net -1020 ml     Assessment/Plan:  73 y.o. male is s/p 1) washout of wounds of right leg 2) simple partial closure of right groin and right popliteal wounds (12x4x2 total volume)  3) application of skin substitute to wounds of right leg  4) application of negative pressure wound therapy to wounds of right leg (12x4x2 total volume)  4 Days Post-Op   Right groin  and popliteal incisions with VAC to suction RLE with Doppler DP/PT/pero signals Pain well managed  Cipro x 2 weeks Encourage leg elevation to help with edema Home VAC delivered to room  Home health arranged with [facility]. Will started on Monday 3/3 He is stable for discharge home today Will follow up in 2-3 weeks for wound check   Graceann Congress, PA-C [facility] [phone] 10/31/2023 8:06 AM  I have independently interviewed and examined patient and agree with PA assessment and plan above.   Ran Tullis C. Randie Heinz, MD [facility] [place] Office: [phone] Pager: [phone]

## 2023-10-31 NOTE — Progress Notes (Signed)
 Discharge instructions provided to patient. All medications, follow up appointments, and discharge instructions discussed. Wound vac instructions provided. IV out. Monitor off CCMD notified. Discharging home.

## 2023-10-31 NOTE — Care Management Important Message (Signed)
 Important Message  Patient Details  Name: Justin Wells MRN: [id number] Date of Birth: 1950/09/15   Important Message Given:  Yes - Medicare IM     Renie Ora 10/31/2023, 12:23 PM

## 2023-11-03 ENCOUNTER — Telehealth: Payer: Self-pay

## 2023-11-03 NOTE — Discharge Summary (Signed)
 Discharge Summary  Patient ID: Justin Wells [id number] 73 y.o. 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  I washed out the wounds last week, and took cultures.  These cultures have grown E. coli.  My partners and I discussed this case in detail, we agreed and his primary surgeon preferred to try a nonoperative approach for now. After careful discussion of risks, benefits, and alternatives the patient was offered repeat washout.  Counseled the patient about the risk of an E. coli infection, and the possibility of disruption of the vascular reconstruction.  The patient understood and wished to proceed.   Hospital Course: Justin Wells was admitted on 10/27/23 and underwent 1) washout of wounds of right leg 2) simple partial closure of right groin and right popliteal wounds (12x4x2 total volume) 3) application of skin substitute to wounds of right leg 4) application of negative pressure wound therapy to wounds of right leg (12x4x2 total volume) by Dr. Lenell Antu. He tolerated the procedure well and was taken to the recovery room in stable condition.  POD#1, RLE remained well perfused and warm with doppler signals. Right groin and popliteal incisions with VAC to suction. ACE maintained on RLE to help with edema. Multimodal pain control. Surgical cultures grew e.coli. was initially placed on broad spectrum Antibiotics with Vanc and Zosyn. Transitioned to Cipro po BID based on sensitivities. Tolerating ambulation in hallway.  Home VAC orders placed.   The remainder of his hospital stay consisted of multimodal pain control, increased mobilization, arrangement of home VAC and HH RN, as well as wound VAC changes.   On 10/31/23 he remained stable for discharge home. Right groin and popliteal incisions with VAC to suction. Right leg remained well perfused. Tolerating mobilization. Prescription for completion of 2 weeks of Cipro sent to his pharmacy. He will resume his home pain medication dosing as prescribed per his pain management. All other home meds he will continue as prescribed. Home VAC delivered to room and applied. He has follow up arranged in 2-3 weeks for wound check.     Consults: None  Treatments: antibiotics: Zosyn and Cipro, analgesia: acetaminophen, Morphine, Aspirin, Oxycodone, anticoagulation: ASA and LMW heparin, therapies: RN and SW, and surgery: 1) washout of wounds of right leg 2) simple partial closure of right groin and right popliteal wounds (12x4x2 total volume)  3) application of skin substitute to wounds of right leg  4) application of negative pressure wound therapy to wounds of right leg (12x4x2 total volume)    Disposition: Discharge disposition: 01-Home or Self Care       Patient Instructions:  Allergies as of 10/31/2023   No Known Allergies      Medication List     TAKE these medications    acetaminophen 500 MG tablet Commonly known as: TYLENOL Take 1,000 mg by mouth every 6 (six) hours as needed for moderate pain.   aspirin EC 81 MG tablet Take 81 mg by mouth at bedtime. Swallow whole.   atorvastatin 80 MG tablet Commonly known as: LIPITOR Take 80 mg by mouth at bedtime.   ciprofloxacin  500 MG tablet Commonly known as: CIPRO Take 1 tablet (500 mg total) by mouth 2 (two) times daily for 11 days.   clopidogrel 75 MG tablet Commonly known as: PLAVIX Take 1 tablet by mouth once daily   DULoxetine 60 MG capsule Commonly known as: CYMBALTA Take 60 mg by mouth at  bedtime.   esomeprazole 40 MG capsule Commonly known as: NEXIUM Take 40 mg by mouth in the morning and at bedtime.   finasteride 5 MG tablet Commonly known as: PROSCAR Take 5 mg by mouth in the morning.   fluticasone 50 MCG/ACT nasal spray Commonly known as: FLONASE Place 2 sprays into both nostrils in the morning and at bedtime.   gabapentin 100 MG capsule Commonly known as: NEURONTIN Take 100-200 mg by mouth See admin instructions. Take 100 mg by mouth in the morning and 200 mg by mouth at bedtime.   isosorbide mononitrate 60 MG 24 hr tablet Commonly known as: IMDUR Take 60 mg by mouth every morning.   levETIRAcetam 750 MG tablet Commonly known as: KEPPRA Take 1 tablet (750 mg total) by mouth 2 (two) times daily. Take 1.5 tablets twice daily What changed: additional instructions   loratadine 10 MG tablet Commonly known as: CLARITIN Take 10 mg by mouth in the morning.   metoprolol succinate 25 MG 24 hr tablet Commonly known as: TOPROL-XL Take 25 mg by mouth in the morning.   mirtazapine 15 MG tablet Commonly known as: REMERON Take 15 mg by mouth at bedtime.   morphine 15 MG 12 hr tablet Commonly known as: MS CONTIN Take 15 mg by mouth 2 (two) times daily.   nitroGLYCERIN 0.4 MG SL tablet Commonly known as: NITROSTAT Place 0.4 mg under the tongue every 5 (five) minutes as needed for chest pain.   Oxycodone HCl 10 MG Tabs Take 10 mg by mouth every 4 (four) hours.   polyethylene glycol 17 g packet Commonly known as: MIRALAX / GLYCOLAX Take 17 g by mouth daily as needed for moderate constipation.   tamsulosin 0.4 MG Caps capsule Commonly known as: FLOMAX Take 0.4 mg by mouth in the morning.   valsartan 40 MG tablet Commonly known as: DIOVAN Take 40 mg by mouth in the morning.   VISINE OP Place 1 drop into both eyes daily as needed (dry eyes).               Discharge Care Instructions  (From admission, onward)           Start     Ordered    10/31/23 0000  Discharge wound care:       Comments: VAC will be changed 3x/ week by home health RN   10/31/23 0813           Activity: activity as tolerated, no driving while on analgesics, and no heavy lifting for 6 weeks Diet: regular diet and low fat, low cholesterol diet Wound Care:  3x/ week VAC changes with home health RN  Follow-up with [facility] in  2-3  weeks.  SignedGraceann Congress, PA-C 11/03/2023 9:23 AM [facility] Office: [phone]

## 2023-11-03 NOTE — Telephone Encounter (Signed)
 Triage:  -received call from Tiffany, RN @ [facility] stating the pt has gone through 2 canisters this morning with the wound vac, if we could call the pt.  -called and spoke to wife she stated she came home with 5 canisters and and they filled up so she put a W-T-D dressing on it over the weekend and got it to stop.  This morning HH came out and reapplied the wound vac but its draining so much.   -consulted with PA to have pt come in tomorrow to evaluate groin.   -wife agreeable to bring pt to appt in the am.

## 2023-11-04 ENCOUNTER — Ambulatory Visit: Admitting: Physician Assistant

## 2023-11-04 ENCOUNTER — Encounter (HOSPITAL_COMMUNITY): Payer: Self-pay | Admitting: Vascular Surgery

## 2023-11-04 ENCOUNTER — Inpatient Hospital Stay (HOSPITAL_COMMUNITY)
Admission: AD | Admit: 2023-11-04 | Discharge: 2023-11-10 | DRG: 857 | Disposition: A | Discharge status: Home Health | Source: Ambulatory Visit | Attending: Vascular Surgery | Admitting: Vascular Surgery

## 2023-11-04 ENCOUNTER — Other Ambulatory Visit: Payer: Self-pay

## 2023-11-04 ENCOUNTER — Ambulatory Visit (HOSPITAL_COMMUNITY): Admitting: Anesthesiology

## 2023-11-04 ENCOUNTER — Encounter (HOSPITAL_COMMUNITY)
Admission: AD | Disposition: A | Discharge status: Home Health | Payer: Self-pay | Source: Ambulatory Visit | Attending: Vascular Surgery

## 2023-11-04 VITALS — BP 113/64 | HR 60 | Temp 98.1°F | Resp 18 | Ht 70.5 in | Wt 207.1 lb

## 2023-11-04 DIAGNOSIS — T8189XA Other complications of procedures, not elsewhere classified, initial encounter: Secondary | ICD-10-CM | POA: Diagnosis not present

## 2023-11-04 DIAGNOSIS — T827XXD Infection and inflammatory reaction due to other cardiac and vascular devices, implants and grafts, subsequent encounter: Principal | ICD-10-CM

## 2023-11-04 DIAGNOSIS — Z7902 Long term (current) use of antithrombotics/antiplatelets: Secondary | ICD-10-CM

## 2023-11-04 DIAGNOSIS — B9620 Unspecified Escherichia coli [E. coli] as the cause of diseases classified elsewhere: Secondary | ICD-10-CM | POA: Diagnosis present

## 2023-11-04 DIAGNOSIS — I69351 Hemiplegia and hemiparesis following cerebral infarction affecting right dominant side: Secondary | ICD-10-CM | POA: Diagnosis not present

## 2023-11-04 DIAGNOSIS — I10 Essential (primary) hypertension: Secondary | ICD-10-CM | POA: Diagnosis present

## 2023-11-04 DIAGNOSIS — T8142XA Infection following a procedure, deep incisional surgical site, initial encounter: Secondary | ICD-10-CM | POA: Diagnosis present

## 2023-11-04 DIAGNOSIS — I9789 Other postprocedural complications and disorders of the circulatory system, not elsewhere classified: Secondary | ICD-10-CM

## 2023-11-04 DIAGNOSIS — E785 Hyperlipidemia, unspecified: Secondary | ICD-10-CM | POA: Diagnosis present

## 2023-11-04 DIAGNOSIS — I97648 Postprocedural seroma of a circulatory system organ or structure following other circulatory system procedure: Secondary | ICD-10-CM | POA: Diagnosis not present

## 2023-11-04 DIAGNOSIS — D62 Acute posthemorrhagic anemia: Secondary | ICD-10-CM | POA: Diagnosis not present

## 2023-11-04 DIAGNOSIS — Y838 Other surgical procedures as the cause of abnormal reaction of the patient, or of later complication, without mention of misadventure at the time of the procedure: Secondary | ICD-10-CM | POA: Diagnosis present

## 2023-11-04 DIAGNOSIS — I2510 Atherosclerotic heart disease of native coronary artery without angina pectoris: Secondary | ICD-10-CM | POA: Diagnosis present

## 2023-11-04 DIAGNOSIS — S81801A Unspecified open wound, right lower leg, initial encounter: Principal | ICD-10-CM | POA: Diagnosis present

## 2023-11-04 DIAGNOSIS — L03115 Cellulitis of right lower limb: Secondary | ICD-10-CM | POA: Diagnosis present

## 2023-11-04 DIAGNOSIS — Z87891 Personal history of nicotine dependence: Secondary | ICD-10-CM | POA: Diagnosis not present

## 2023-11-04 DIAGNOSIS — J449 Chronic obstructive pulmonary disease, unspecified: Secondary | ICD-10-CM | POA: Diagnosis not present

## 2023-11-04 DIAGNOSIS — T8140XA Infection following a procedure, unspecified, initial encounter: Secondary | ICD-10-CM

## 2023-11-04 DIAGNOSIS — T8189XD Other complications of procedures, not elsewhere classified, subsequent encounter: Secondary | ICD-10-CM

## 2023-11-04 DIAGNOSIS — Z8679 Personal history of other diseases of the circulatory system: Secondary | ICD-10-CM | POA: Diagnosis not present

## 2023-11-04 DIAGNOSIS — I739 Peripheral vascular disease, unspecified: Secondary | ICD-10-CM

## 2023-11-04 DIAGNOSIS — S71101A Unspecified open wound, right thigh, initial encounter: Secondary | ICD-10-CM | POA: Diagnosis not present

## 2023-11-04 DIAGNOSIS — I70201 Unspecified atherosclerosis of native arteries of extremities, right leg: Secondary | ICD-10-CM | POA: Diagnosis present

## 2023-11-04 DIAGNOSIS — L7634 Postprocedural seroma of skin and subcutaneous tissue following other procedure: Secondary | ICD-10-CM | POA: Diagnosis present

## 2023-11-04 HISTORY — PX: APPLICATION OF WOUND VAC: SHX5189

## 2023-11-04 HISTORY — PX: MUSCLE FLAP CLOSURE: SHX2054

## 2023-11-04 HISTORY — PX: INCISION AND DRAINAGE OF WOUND: SHX1803

## 2023-11-04 LAB — POCT I-STAT, CHEM 8
BUN: 5 mg/dL — ABNORMAL LOW (ref 8–23)
Calcium, Ion: 1.08 mmol/L — ABNORMAL LOW (ref 1.15–1.40)
Chloride: 102 mmol/L (ref 98–111)
Creatinine, Ser: 0.9 mg/dL (ref 0.61–1.24)
Glucose, Bld: 98 mg/dL (ref 70–99)
HCT: 30 % — ABNORMAL LOW (ref 39.0–52.0)
Hemoglobin: 10.2 g/dL — ABNORMAL LOW (ref 13.0–17.0)
Potassium: 4 mmol/L (ref 3.5–5.1)
Sodium: 136 mmol/L (ref 135–145)
TCO2: 26 mmol/L (ref 22–32)

## 2023-11-04 SURGERY — IRRIGATION AND DEBRIDEMENT WOUND
Anesthesia: General | Site: Leg Lower | Laterality: Right

## 2023-11-04 MED ORDER — HYDROMORPHONE HCL 1 MG/ML IJ SOLN
0.2500 mg | INTRAMUSCULAR | Status: DC | PRN
  Administered 2023-11-04 (×4): 0.5 mg via INTRAVENOUS

## 2023-11-04 MED ORDER — METOPROLOL TARTRATE 5 MG/5ML IV SOLN: 2.0000 mg | INTRAVENOUS | Status: DC | PRN

## 2023-11-04 MED ORDER — HYDROMORPHONE HCL 1 MG/ML IJ SOLN
INTRAMUSCULAR | Status: AC
  Filled 2023-11-04: qty 1

## 2023-11-04 MED ORDER — LABETALOL HCL 5 MG/ML IV SOLN: 10.0000 mg | INTRAVENOUS | Status: DC | PRN

## 2023-11-04 MED ORDER — SODIUM CHLORIDE 0.9 % IV SOLN: INTRAVENOUS | Status: DC

## 2023-11-04 MED ORDER — SODIUM CHLORIDE 0.9 % IR SOLN
Status: DC | PRN
  Administered 2023-11-04: 1000 mL

## 2023-11-04 MED ORDER — ISOSORBIDE MONONITRATE ER 60 MG PO TB24
60.0000 mg | ORAL_TABLET | Freq: Every morning | ORAL | Status: DC
Start: 2023-11-05 — End: 2023-11-10
  Administered 2023-11-07 – 2023-11-10 (×4): 60 mg via ORAL
  Filled 2023-11-04 (×5): qty 1

## 2023-11-04 MED ORDER — HEPARIN SODIUM (PORCINE) 5000 UNIT/ML IJ SOLN: 5000.0000 [IU] | Freq: Three times a day (TID) | INTRAMUSCULAR | Status: DC

## 2023-11-04 MED ORDER — OXYCODONE HCL 5 MG PO TABS
5.0000 mg | ORAL_TABLET | ORAL | Status: DC | PRN
  Administered 2023-11-04 (×2): 10 mg via ORAL
  Administered 2023-11-05: 5 mg via ORAL
  Administered 2023-11-05 – 2023-11-08 (×16): 10 mg via ORAL
  Administered 2023-11-09 (×2): 5 mg via ORAL
  Administered 2023-11-09 – 2023-11-10 (×5): 10 mg via ORAL
  Filled 2023-11-04 (×7): qty 2
  Filled 2023-11-04 (×2): qty 1
  Filled 2023-11-04 (×2): qty 2
  Filled 2023-11-04: qty 1
  Filled 2023-11-04 (×9): qty 2
  Filled 2023-11-04: qty 1
  Filled 2023-11-04 (×6): qty 2

## 2023-11-04 MED ORDER — ORAL CARE MOUTH RINSE: 15.0000 mL | Freq: Once | OROMUCOSAL | Status: AC

## 2023-11-04 MED ORDER — FINASTERIDE 5 MG PO TABS
5.0000 mg | ORAL_TABLET | Freq: Every morning | ORAL | Status: DC
  Administered 2023-11-05 – 2023-11-10 (×6): 5 mg via ORAL
  Filled 2023-11-04 (×6): qty 1

## 2023-11-04 MED ORDER — PROPOFOL 10 MG/ML IV BOLUS
INTRAVENOUS | Status: DC | PRN
  Administered 2023-11-04: 100 mg via INTRAVENOUS

## 2023-11-04 MED ORDER — HYDRALAZINE HCL 20 MG/ML IJ SOLN: 5.0000 mg | INTRAMUSCULAR | Status: DC | PRN

## 2023-11-04 MED ORDER — ASPIRIN 81 MG PO TBEC
81.0000 mg | DELAYED_RELEASE_TABLET | Freq: Every day | ORAL | Status: DC
  Administered 2023-11-04 – 2023-11-09 (×6): 81 mg via ORAL
  Filled 2023-11-04 (×6): qty 1

## 2023-11-04 MED ORDER — MIRTAZAPINE 15 MG PO TABS
15.0000 mg | ORAL_TABLET | Freq: Every day | ORAL | Status: DC
  Administered 2023-11-04 – 2023-11-09 (×6): 15 mg via ORAL
  Filled 2023-11-04 (×6): qty 1

## 2023-11-04 MED ORDER — LIDOCAINE 2% (20 MG/ML) 5 ML SYRINGE
INTRAMUSCULAR | Status: DC | PRN
  Administered 2023-11-04: 60 mg via INTRAVENOUS

## 2023-11-04 MED ORDER — CEFAZOLIN SODIUM-DEXTROSE 2-4 GM/100ML-% IV SOLN
2.0000 g | INTRAVENOUS | Status: AC
  Administered 2023-11-04: 2 g via INTRAVENOUS
  Filled 2023-11-04: qty 100

## 2023-11-04 MED ORDER — PHENOL 1.4 % MT LIQD: 1.0000 | OROMUCOSAL | Status: DC | PRN

## 2023-11-04 MED ORDER — GABAPENTIN 100 MG PO CAPS
200.0000 mg | ORAL_CAPSULE | Freq: Every day | ORAL | Status: DC
  Administered 2023-11-04 – 2023-11-09 (×6): 200 mg via ORAL
  Filled 2023-11-04 (×6): qty 2

## 2023-11-04 MED ORDER — ALUM & MAG HYDROXIDE-SIMETH 200-200-20 MG/5ML PO SUSP: 15.0000 mL | ORAL | Status: DC | PRN

## 2023-11-04 MED ORDER — CHLORHEXIDINE GLUCONATE 0.12 % MT SOLN
15.0000 mL | Freq: Once | OROMUCOSAL | Status: AC
  Administered 2023-11-04: 15 mL via OROMUCOSAL
  Filled 2023-11-04: qty 15

## 2023-11-04 MED ORDER — CHLORHEXIDINE GLUCONATE 4 % EX SOLN: 60.0000 mL | Freq: Once | CUTANEOUS | Status: DC

## 2023-11-04 MED ORDER — GABAPENTIN 100 MG PO CAPS: 100.0000 mg | ORAL_CAPSULE | ORAL | Status: DC

## 2023-11-04 MED ORDER — ATORVASTATIN CALCIUM 80 MG PO TABS
80.0000 mg | ORAL_TABLET | Freq: Every day | ORAL | Status: DC
  Administered 2023-11-04 – 2023-11-09 (×6): 80 mg via ORAL
  Filled 2023-11-04 (×6): qty 1

## 2023-11-04 MED ORDER — LACTATED RINGERS IV SOLN
INTRAVENOUS | Status: DC | PRN
Start: 2023-11-04 — End: 2023-11-04

## 2023-11-04 MED ORDER — LEVETIRACETAM 750 MG PO TABS
750.0000 mg | ORAL_TABLET | Freq: Two times a day (BID) | ORAL | Status: DC
  Administered 2023-11-04 – 2023-11-10 (×12): 750 mg via ORAL
  Filled 2023-11-04 (×13): qty 1

## 2023-11-04 MED ORDER — PHENYLEPHRINE HCL-NACL 20-0.9 MG/250ML-% IV SOLN
INTRAVENOUS | Status: DC | PRN
  Administered 2023-11-04: 40 ug/min via INTRAVENOUS

## 2023-11-04 MED ORDER — VASHE WOUND IRRIGATION OPTIME
TOPICAL | Status: DC | PRN
  Administered 2023-11-04: 34 [oz_av]

## 2023-11-04 MED ORDER — FENTANYL CITRATE (PF) 100 MCG/2ML IJ SOLN
INTRAMUSCULAR | Status: AC
  Filled 2023-11-04: qty 2

## 2023-11-04 MED ORDER — ACETAMINOPHEN 500 MG PO TABS
1000.0000 mg | ORAL_TABLET | Freq: Once | ORAL | Status: AC
  Administered 2023-11-04: 1000 mg via ORAL
  Filled 2023-11-04: qty 2

## 2023-11-04 MED ORDER — PANTOPRAZOLE SODIUM 40 MG PO TBEC
40.0000 mg | DELAYED_RELEASE_TABLET | Freq: Every day | ORAL | Status: DC
  Administered 2023-11-05 – 2023-11-10 (×6): 40 mg via ORAL
  Filled 2023-11-04 (×6): qty 1

## 2023-11-04 MED ORDER — MORPHINE SULFATE (PF) 2 MG/ML IV SOLN
2.0000 mg | INTRAVENOUS | Status: DC | PRN
  Administered 2023-11-04 – 2023-11-10 (×29): 2 mg via INTRAVENOUS
  Filled 2023-11-04 (×29): qty 1

## 2023-11-04 MED ORDER — FENTANYL CITRATE (PF) 100 MCG/2ML IJ SOLN
25.0000 ug | INTRAMUSCULAR | Status: DC | PRN
  Administered 2023-11-04 (×3): 50 ug via INTRAVENOUS

## 2023-11-04 MED ORDER — GUAIFENESIN-DM 100-10 MG/5ML PO SYRP: 15.0000 mL | ORAL_SOLUTION | ORAL | Status: DC | PRN

## 2023-11-04 MED ORDER — ONDANSETRON HCL 4 MG/2ML IJ SOLN
4.0000 mg | Freq: Four times a day (QID) | INTRAMUSCULAR | Status: DC | PRN
  Administered 2023-11-07: 4 mg via INTRAVENOUS

## 2023-11-04 MED ORDER — 0.9 % SODIUM CHLORIDE (POUR BTL) OPTIME
TOPICAL | Status: DC | PRN
  Administered 2023-11-04: 1000 mL

## 2023-11-04 MED ORDER — ONDANSETRON HCL 4 MG/2ML IJ SOLN
INTRAMUSCULAR | Status: AC
  Filled 2023-11-04: qty 2

## 2023-11-04 MED ORDER — FENTANYL CITRATE (PF) 250 MCG/5ML IJ SOLN
INTRAMUSCULAR | Status: DC | PRN
  Administered 2023-11-04: 50 ug via INTRAVENOUS
  Administered 2023-11-04: 25 ug via INTRAVENOUS
  Administered 2023-11-04: 50 ug via INTRAVENOUS

## 2023-11-04 MED ORDER — FENTANYL CITRATE (PF) 250 MCG/5ML IJ SOLN
INTRAMUSCULAR | Status: AC
  Filled 2023-11-04: qty 5

## 2023-11-04 MED ORDER — IRBESARTAN 75 MG PO TABS
37.5000 mg | ORAL_TABLET | Freq: Every day | ORAL | Status: DC
  Administered 2023-11-07 – 2023-11-10 (×4): 37.5 mg via ORAL
  Filled 2023-11-04 (×6): qty 0.5

## 2023-11-04 MED ORDER — ONDANSETRON HCL 4 MG/2ML IJ SOLN
INTRAMUSCULAR | Status: DC | PRN
Start: 2023-11-04 — End: 2023-11-04
  Administered 2023-11-04: 4 mg via INTRAVENOUS

## 2023-11-04 MED ORDER — POTASSIUM CHLORIDE CRYS ER 20 MEQ PO TBCR: 20.0000 meq | EXTENDED_RELEASE_TABLET | Freq: Once | ORAL | Status: DC

## 2023-11-04 MED ORDER — METOPROLOL SUCCINATE ER 25 MG PO TB24
25.0000 mg | ORAL_TABLET | Freq: Every morning | ORAL | Status: DC
  Administered 2023-11-06 – 2023-11-10 (×5): 25 mg via ORAL
  Filled 2023-11-04 (×6): qty 1

## 2023-11-04 MED ORDER — EPHEDRINE 5 MG/ML INJ
INTRAVENOUS | Status: AC
  Filled 2023-11-04: qty 5

## 2023-11-04 MED ORDER — OXYCODONE HCL 5 MG PO TABS: 5.0000 mg | ORAL_TABLET | Freq: Once | ORAL | Status: DC | PRN

## 2023-11-04 MED ORDER — DULOXETINE HCL 60 MG PO CPEP
60.0000 mg | ORAL_CAPSULE | Freq: Every day | ORAL | Status: DC
  Administered 2023-11-04 – 2023-11-09 (×6): 60 mg via ORAL
  Filled 2023-11-04 (×6): qty 1

## 2023-11-04 MED ORDER — OXYCODONE HCL 5 MG/5ML PO SOLN: 5.0000 mg | Freq: Once | ORAL | Status: DC | PRN

## 2023-11-04 MED ORDER — GABAPENTIN 100 MG PO CAPS
100.0000 mg | ORAL_CAPSULE | Freq: Every morning | ORAL | Status: DC
  Administered 2023-11-05 – 2023-11-10 (×6): 100 mg via ORAL
  Filled 2023-11-04 (×6): qty 1

## 2023-11-04 MED ORDER — PROPOFOL 10 MG/ML IV BOLUS
INTRAVENOUS | Status: AC
  Filled 2023-11-04: qty 20

## 2023-11-04 MED ORDER — LIDOCAINE 2% (20 MG/ML) 5 ML SYRINGE
INTRAMUSCULAR | Status: AC
  Filled 2023-11-04: qty 5

## 2023-11-04 MED ORDER — OXYCODONE HCL 5 MG PO TABS
ORAL_TABLET | ORAL | Status: AC
  Filled 2023-11-04: qty 2

## 2023-11-04 MED ORDER — ACETAMINOPHEN 650 MG RE SUPP: 325.0000 mg | RECTAL | Status: DC | PRN

## 2023-11-04 MED ORDER — ONDANSETRON HCL 4 MG/2ML IJ SOLN: 4.0000 mg | Freq: Once | INTRAMUSCULAR | Status: DC | PRN

## 2023-11-04 MED ORDER — ACETAMINOPHEN 325 MG PO TABS
325.0000 mg | ORAL_TABLET | ORAL | Status: DC | PRN
  Administered 2023-11-05 – 2023-11-07 (×4): 650 mg via ORAL
  Filled 2023-11-04 (×4): qty 2

## 2023-11-04 MED ORDER — TAMSULOSIN HCL 0.4 MG PO CAPS
0.4000 mg | ORAL_CAPSULE | Freq: Every morning | ORAL | Status: DC
  Administered 2023-11-05 – 2023-11-10 (×6): 0.4 mg via ORAL
  Filled 2023-11-04 (×6): qty 1

## 2023-11-04 SURGICAL SUPPLY — 47 items
BAG COUNTER SPONGE SURGICOUNT (BAG) ×2 IMPLANT
BNDG COHESIVE 4X5 TAN STRL LF (GAUZE/BANDAGES/DRESSINGS) IMPLANT
BNDG ELASTIC 4X5.8 VLCR STR LF (GAUZE/BANDAGES/DRESSINGS) IMPLANT
BNDG ELASTIC 6INX 5YD STR LF (GAUZE/BANDAGES/DRESSINGS) IMPLANT
BNDG ELASTIC 6X10 VLCR STRL LF (GAUZE/BANDAGES/DRESSINGS) IMPLANT
BNDG GAUZE DERMACEA FLUFF 4 (GAUZE/BANDAGES/DRESSINGS) IMPLANT
CANISTER SUCT 3000ML PPV (MISCELLANEOUS) ×2 IMPLANT
CANISTER WOUND CARE 500ML ATS (WOUND CARE) IMPLANT
CLEANSER WND VASHE 34 (WOUND CARE) IMPLANT
CLIP LIGATING EXTRA MED SLVR (CLIP) ×2 IMPLANT
CLIP LIGATING EXTRA SM BLUE (MISCELLANEOUS) ×2 IMPLANT
CNTNR URN SCR LID CUP LEK RST (MISCELLANEOUS) IMPLANT
CONNECTOR Y ATS VAC SYSTEM (MISCELLANEOUS) IMPLANT
COVER SURGICAL LIGHT HANDLE (MISCELLANEOUS) ×2 IMPLANT
DRAPE HALF SHEET 40X57 (DRAPES) IMPLANT
DRAPE SURG ORHT 6 SPLT 77X108 (DRAPES) IMPLANT
DRESSING VERAFLO CLEANS CC MED (GAUZE/BANDAGES/DRESSINGS) IMPLANT
DRSG VAC GRANUFOAM SM (GAUZE/BANDAGES/DRESSINGS) IMPLANT
DRSG VERAFLO CLEANSE CC MED (GAUZE/BANDAGES/DRESSINGS) ×2 IMPLANT
ELECT REM PT RETURN 9FT ADLT (ELECTROSURGICAL) ×2 IMPLANT
ELECTRODE REM PT RTRN 9FT ADLT (ELECTROSURGICAL) ×2 IMPLANT
GAUZE SPONGE 4X4 12PLY STRL (GAUZE/BANDAGES/DRESSINGS) ×2 IMPLANT
GAUZE XEROFORM 5X9 LF (GAUZE/BANDAGES/DRESSINGS) IMPLANT
GLOVE BIO SURGEON STRL SZ7.5 (GLOVE) ×2 IMPLANT
GOWN STRL REUS W/ TWL LRG LVL3 (GOWN DISPOSABLE) ×4 IMPLANT
GOWN STRL REUS W/ TWL XL LVL3 (GOWN DISPOSABLE) ×2 IMPLANT
GRAFT SKIN WND MICRO 38 (Tissue) IMPLANT
GRAFT SKIN WND SURGICLOSE M95 (Tissue) IMPLANT
IV NS IRRIG 3000ML ARTHROMATIC (IV SOLUTION) ×2 IMPLANT
KIT BASIN OR (CUSTOM PROCEDURE TRAY) ×2 IMPLANT
KIT TURNOVER KIT B (KITS) ×2 IMPLANT
NS IRRIG 1000ML POUR BTL (IV SOLUTION) ×2 IMPLANT
PACK GENERAL/GYN (CUSTOM PROCEDURE TRAY) ×2 IMPLANT
PAD ARMBOARD 7.5X6 YLW CONV (MISCELLANEOUS) ×4 IMPLANT
PAD NEG PRESSURE SENSATRAC (MISCELLANEOUS) IMPLANT
POWDER SURGICEL 3.0 GRAM (HEMOSTASIS) IMPLANT
SET HNDPC FAN SPRY TIP SCT (DISPOSABLE) IMPLANT
SPONGE T-LAP 18X18 ~~LOC~~+RFID (SPONGE) IMPLANT
STAPLER SKIN PROX WIDE 3.9 (STAPLE) IMPLANT
STOCKINETTE IMPERVIOUS LG (DRAPES) IMPLANT
SUT ETHILON 3 0 PS 1 (SUTURE) IMPLANT
SUT VIC AB 2-0 CT1 TAPERPNT 27 (SUTURE) IMPLANT
SUT VIC AB 2-0 CTX 36 (SUTURE) IMPLANT
SUT VIC AB 3-0 SH 27X BRD (SUTURE) IMPLANT
SUT VICRYL 4-0 PS2 18IN ABS (SUTURE) IMPLANT
TOWEL GREEN STERILE (TOWEL DISPOSABLE) ×2 IMPLANT
WATER STERILE IRR 1000ML POUR (IV SOLUTION) ×2 IMPLANT

## 2023-11-04 NOTE — Interval H&P Note (Signed)
 History and Physical Interval Note:  11/04/2023 11:01 AM  Justin Wells  has presented today for surgery, with the diagnosis of Non Healing Surgical Wound.  The various methods of treatment have been discussed with the patient and family. After consideration of risks, benefits and other options for treatment, the patient has consented to  Procedure(s): IRRIGATION AND DEBRIDEMENT WOUND (Right) as a surgical intervention.  The patient's history has been reviewed, patient examined, no change in status, stable for surgery.  I have reviewed the patient's chart and labs.  Questions were answered to the patient's satisfaction.     Lemar Livings

## 2023-11-04 NOTE — Progress Notes (Signed)
 Patient arrived from PACU. Wound vac on right groin was not sealing and copious amount of blood seeping around the trac pad and the dressing itself.   Cannister was changed in PACU prior to bringing patient to the unit. Showing blockage. Seal was obtain in groin area at this time however it continues to alarm.    Hematoma lateral side of right groin. Perimeters marked. Called PACU to make sure if there was a hematoma in the PACU. RN denied any hematoma.   Dr. Hetty Blend paged. Informed him of the condition of the right groin and the hematoma that developed. B/p soft in 90s systolic. Asked Dr. Hetty Blend to come to bedside to assess this patient. Dr. Hetty Blend told this RN to take the wound vac down and place wet to dry.   At this time, I will attempted to replace the wound vac. If this does not work and continues to alarm and bleed, then I will do the wet to dry.  Will continue to monitor

## 2023-11-04 NOTE — H&P (View-Only) (Signed)
 POST OPERATIVE OFFICE NOTE    CC:  F/u for surgery  HPI:  This is a 73 y.o. male who is s/p  Right groin I&D on 10/23/23 and again on 10/27/23 by Dr. Lenell Antu. This was indicated after development of cellulitis of the RLE with drainage from groin incision. He did develop e.coli on surgical cultures and is currently on Cipro.  He is recently s/p Extensive right common femoral endarterectomy including external iliac, profunda and superficial femoral arteries with bovine pericardial patch angioplasty and Right common femoral to above-knee popliteal artery bypass graft with 6 mm ringed PTFE including right popliteal artery endarterectomy by Dr. Randie Heinz on 10/14/23.   Pt returns today for follow up with his wife.  Pt states since Friday he has had extensive amount of serous drainage from the right groin. It has been saturating his clothing and bedding. The wound VAC has not been keeping seal well because of drainage and they have filled numerous canisters because of the serous output. They ran out of supplies today because of all the canisters filling up so quickly. They report they are suppose to have more delivered to their house this morning. He denies any fever or chills. He does admit that he has not been elevating his leg and has been sitting with it in dependent position. Has a lot more swelling again in right leg and foot. He is still taking his Cipro as prescribed x 14 days. He is medically managed on Aspirin, Plavix, statin.    No Known Allergies  Current Outpatient Medications  Medication Sig Dispense Refill   acetaminophen (TYLENOL) 500 MG tablet Take 1,000 mg by mouth every 6 (six) hours as needed for moderate pain.     aspirin EC 81 MG tablet Take 81 mg by mouth at bedtime. Swallow whole.     atorvastatin (LIPITOR) 80 MG tablet Take 80 mg by mouth at bedtime.     ciprofloxacin (CIPRO) 500 MG tablet Take 1 tablet (500 mg total) by mouth 2 (two) times daily for 11 days. 22 tablet 0   clopidogrel  (PLAVIX) 75 MG tablet Take 1 tablet by mouth once daily (Patient taking differently: Take 75 mg by mouth daily.) 90 tablet 3   DULoxetine (CYMBALTA) 60 MG capsule Take 60 mg by mouth at bedtime.     esomeprazole (NEXIUM) 40 MG capsule Take 40 mg by mouth in the morning and at bedtime.     finasteride (PROSCAR) 5 MG tablet Take 5 mg by mouth in the morning.     fluticasone (FLONASE) 50 MCG/ACT nasal spray Place 2 sprays into both nostrils in the morning and at bedtime.     gabapentin (NEURONTIN) 100 MG capsule Take 100-200 mg by mouth See admin instructions. Take 100 mg by mouth in the morning and 200 mg by mouth at bedtime.     isosorbide mononitrate (IMDUR) 60 MG 24 hr tablet Take 60 mg by mouth every morning.     levETIRAcetam (KEPPRA) 750 MG tablet Take 1 tablet (750 mg total) by mouth 2 (two) times daily. Take 1.5 tablets twice daily (Patient taking differently: Take 750 mg by mouth 2 (two) times daily.) 60 tablet 1   loratadine (CLARITIN) 10 MG tablet Take 10 mg by mouth in the morning. (Patient not taking: Reported on 10/22/2023)     metoprolol succinate (TOPROL-XL) 25 MG 24 hr tablet Take 25 mg by mouth in the morning.     mirtazapine (REMERON) 15 MG tablet Take 15 mg by mouth  at bedtime.     morphine (MS CONTIN) 15 MG 12 hr tablet Take 15 mg by mouth 2 (two) times daily.     nitroGLYCERIN (NITROSTAT) 0.4 MG SL tablet Place 0.4 mg under the tongue every 5 (five) minutes as needed for chest pain.     Oxycodone HCl 10 MG TABS Take 10 mg by mouth every 4 (four) hours.     polyethylene glycol (MIRALAX / GLYCOLAX) 17 g packet Take 17 g by mouth daily as needed for moderate constipation.     tamsulosin (FLOMAX) 0.4 MG CAPS capsule Take 0.4 mg by mouth in the morning.     Tetrahydrozoline HCl (VISINE OP) Place 1 drop into both eyes daily as needed (dry eyes).     valsartan (DIOVAN) 40 MG tablet Take 40 mg by mouth in the morning.     No current facility-administered medications for this visit.      ROS:  See HPI  Physical Exam:  Vitals:   11/04/23 0943  BP: 113/64  Pulse: 60  Resp: 18  Temp: 98.1 F (36.7 C)  SpO2: 98%   Incision:  Right groin incision wound healthy appearing, there is a lot of SS drainage from distal aspect of wound. Right AK pop incision healthy appearing granulation tissue. Wet to dry dressings applied Extremities:  Right leg is very edematous Neuro: alert and oriented  Assessment/Plan:  This is a 73 y.o. male who is s/p: Right groin I&D on 10/23/23 and again on 10/27/23 by Dr. Lenell Antu. This was indicated after development of cellulitis of the RLE with drainage from groin incision. He did develop e.coli on surgical cultures and is currently on Cipro.  He is recently s/p Extensive right common femoral endarterectomy including external iliac, profunda and superficial femoral arteries with bovine pericardial patch angioplasty and Right common femoral to above-knee popliteal artery bypass graft with 6 mm ringed PTFE including right popliteal artery endarterectomy by Dr. Randie Heinz on 10/14/23.  He now has significant Serous output from the right groin. Wound VAC unable to keep up with output. No signs of overt infection. 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 Lenell Antu as well as Dr. Randie Heinz and recommend washout and application of wound VAC in OR today - Plan is for direct admit. Patient will go to pre op holding area for surgery this morning with Dr. Randie Heinz. Patient is NPO.   Nathanial Rancher, [facility] [phone]   Clinic MD:  Lenell Antu

## 2023-11-04 NOTE — Progress Notes (Addendum)
 POST OPERATIVE OFFICE NOTE    CC:  F/u for surgery  HPI:  This is a 73 y.o. male who is s/p  Right groin I&D on 10/23/23 and again on 10/27/23 by Dr. Lenell Antu. This was indicated after development of cellulitis of the RLE with drainage from groin incision. He did develop e.coli on surgical cultures and is currently on Cipro.  He is recently s/p Extensive right common femoral endarterectomy including external iliac, profunda and superficial femoral arteries with bovine pericardial patch angioplasty and Right common femoral to above-knee popliteal artery bypass graft with 6 mm ringed PTFE including right popliteal artery endarterectomy by Dr. Randie Heinz on 10/14/23.   Pt returns today for follow up with his wife.  Pt states since Friday he has had extensive amount of serous drainage from the right groin. It has been saturating his clothing and bedding. The wound VAC has not been keeping seal well because of drainage and they have filled numerous canisters because of the serous output. They ran out of supplies today because of all the canisters filling up so quickly. They report they are suppose to have more delivered to their house this morning. He denies any fever or chills. He does admit that he has not been elevating his leg and has been sitting with it in dependent position. Has a lot more swelling again in right leg and foot. He is still taking his Cipro as prescribed x 14 days. He is medically managed on Aspirin, Plavix, statin.    No Known Allergies  Current Outpatient Medications  Medication Sig Dispense Refill   acetaminophen (TYLENOL) 500 MG tablet Take 1,000 mg by mouth every 6 (six) hours as needed for moderate pain.     aspirin EC 81 MG tablet Take 81 mg by mouth at bedtime. Swallow whole.     atorvastatin (LIPITOR) 80 MG tablet Take 80 mg by mouth at bedtime.     ciprofloxacin (CIPRO) 500 MG tablet Take 1 tablet (500 mg total) by mouth 2 (two) times daily for 11 days. 22 tablet 0   clopidogrel  (PLAVIX) 75 MG tablet Take 1 tablet by mouth once daily (Patient taking differently: Take 75 mg by mouth daily.) 90 tablet 3   DULoxetine (CYMBALTA) 60 MG capsule Take 60 mg by mouth at bedtime.     esomeprazole (NEXIUM) 40 MG capsule Take 40 mg by mouth in the morning and at bedtime.     finasteride (PROSCAR) 5 MG tablet Take 5 mg by mouth in the morning.     fluticasone (FLONASE) 50 MCG/ACT nasal spray Place 2 sprays into both nostrils in the morning and at bedtime.     gabapentin (NEURONTIN) 100 MG capsule Take 100-200 mg by mouth See admin instructions. Take 100 mg by mouth in the morning and 200 mg by mouth at bedtime.     isosorbide mononitrate (IMDUR) 60 MG 24 hr tablet Take 60 mg by mouth every morning.     levETIRAcetam (KEPPRA) 750 MG tablet Take 1 tablet (750 mg total) by mouth 2 (two) times daily. Take 1.5 tablets twice daily (Patient taking differently: Take 750 mg by mouth 2 (two) times daily.) 60 tablet 1   loratadine (CLARITIN) 10 MG tablet Take 10 mg by mouth in the morning. (Patient not taking: Reported on 10/22/2023)     metoprolol succinate (TOPROL-XL) 25 MG 24 hr tablet Take 25 mg by mouth in the morning.     mirtazapine (REMERON) 15 MG tablet Take 15 mg by mouth  at bedtime.     morphine (MS CONTIN) 15 MG 12 hr tablet Take 15 mg by mouth 2 (two) times daily.     nitroGLYCERIN (NITROSTAT) 0.4 MG SL tablet Place 0.4 mg under the tongue every 5 (five) minutes as needed for chest pain.     Oxycodone HCl 10 MG TABS Take 10 mg by mouth every 4 (four) hours.     polyethylene glycol (MIRALAX / GLYCOLAX) 17 g packet Take 17 g by mouth daily as needed for moderate constipation.     tamsulosin (FLOMAX) 0.4 MG CAPS capsule Take 0.4 mg by mouth in the morning.     Tetrahydrozoline HCl (VISINE OP) Place 1 drop into both eyes daily as needed (dry eyes).     valsartan (DIOVAN) 40 MG tablet Take 40 mg by mouth in the morning.     No current facility-administered medications for this visit.      ROS:  See HPI  Physical Exam:  Vitals:   11/04/23 0943  BP: 113/64  Pulse: 60  Resp: 18  Temp: 98.1 F (36.7 C)  SpO2: 98%   Incision:  Right groin incision wound healthy appearing, there is a lot of SS drainage from distal aspect of wound. Right AK pop incision healthy appearing granulation tissue. Wet to dry dressings applied Extremities:  Right leg is very edematous Neuro: alert and oriented  Assessment/Plan:  This is a 73 y.o. male who is s/p: Right groin I&D on 10/23/23 and again on 10/27/23 by Dr. Lenell Antu. This was indicated after development of cellulitis of the RLE with drainage from groin incision. He did develop e.coli on surgical cultures and is currently on Cipro.  He is recently s/p Extensive right common femoral endarterectomy including external iliac, profunda and superficial femoral arteries with bovine pericardial patch angioplasty and Right common femoral to above-knee popliteal artery bypass graft with 6 mm ringed PTFE including right popliteal artery endarterectomy by Dr. Randie Heinz on 10/14/23.  He now has significant Serous output from the right groin. Wound VAC unable to keep up with output. No signs of overt infection. 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 Lenell Antu as well as Dr. Randie Heinz and recommend washout and application of wound VAC in OR today - Plan is for direct admit. Patient will go to pre op holding area for surgery this morning with Dr. Randie Heinz. Patient is NPO.   Nathanial Rancher, [facility] [phone]   Clinic MD:  Lenell Antu

## 2023-11-04 NOTE — Transfer of Care (Signed)
 Immediate Anesthesia Transfer of Care Note  Patient: Justin Wells  Procedure(s) Performed: IRRIGATION AND DEBRIDEMENT RIGHT GROIN AND LOWER LEG WOUND (Right: Leg Lower) APPLICATION OF WOUND VAC TO RIGHT GROIN AND RIGHT LOWER LEG (Right: Groin) APPLICATION OF KERECIS (Right: Groin) CLOSURE OF RIGHT GROIN WOUND, USING MUSCLE FLAP (Right: Groin)  Patient Location: PACU  Anesthesia Type:General  Level of Consciousness: awake, drowsy, patient cooperative, and responds to stimulation  Airway & Oxygen Therapy: Patient Spontanous Breathing and Patient connected to nasal cannula oxygen  Post-op Assessment: Report given to RN and Post -op Vital signs reviewed and stable  Post vital signs: Reviewed and stable  Last Vitals:  Vitals Value Taken Time  BP 138/73 11/04/23 1441  Temp    Pulse 56 11/04/23 1443  Resp 14 11/04/23 1444  SpO2 100 % 11/04/23 1443  Vitals shown include unfiled device data.  Last Pain:  Vitals:   11/04/23 1140  TempSrc:   PainSc: 0-No pain         Complications: No notable events documented.

## 2023-11-04 NOTE — Anesthesia Procedure Notes (Signed)
 Procedure Name: LMA Insertion Date/Time: 11/04/2023 1:25 PM  Performed by: Ayesha Rumpf, CRNAPre-anesthesia Checklist: Patient identified, Emergency Drugs available, Suction available and Patient being monitored Patient Re-evaluated:Patient Re-evaluated prior to induction Oxygen Delivery Method: Circle System Utilized Preoxygenation: Pre-oxygenation with 100% oxygen Induction Type: IV induction Ventilation: Mask ventilation without difficulty LMA: LMA inserted LMA Size: 5.0 Number of attempts: 1 Airway Equipment and Method: Bite block Placement Confirmation: positive ETCO2 Tube secured with: Tape Dental Injury: Teeth and Oropharynx as per pre-operative assessment

## 2023-11-04 NOTE — Anesthesia Preprocedure Evaluation (Addendum)
 Anesthesia Evaluation  Patient identified by MRN, date of birth, ID band Patient awake    Reviewed: Allergy & Precautions, NPO status , Patient's Chart, lab work & pertinent test results, reviewed documented beta blocker date and time   History of Anesthesia Complications Negative for: history of anesthetic complications  Airway Mallampati: II  TM Distance: >3 FB Neck ROM: Full    Dental  (+) Lower Dentures, Upper Dentures   Pulmonary COPD, former smoker   Pulmonary exam normal        Cardiovascular hypertension, Pt. on home beta blockers and Pt. on medications + CAD, + Cardiac Stents and + Peripheral Vascular Disease  Normal cardiovascular exam   '24 TTE - Benign echo, normal EF, No significant valvular d/o identified.    Neuro/Psych  Headaches, Seizures -, Well Controlled,  PSYCHIATRIC DISORDERS Anxiety Depression    TIACVA, Residual Symptoms    GI/Hepatic Neg liver ROS, hiatal hernia,GERD  Medicated and Controlled,, Hx lap band    Endo/Other  negative endocrine ROS    Renal/GU negative Renal ROS     Musculoskeletal  (+) Arthritis ,    Abdominal   Peds  Hematology  (+) Blood dyscrasia, anemia  On plavix    Anesthesia Other Findings   Reproductive/Obstetrics                              Anesthesia Physical Anesthesia Plan  ASA: 3  Anesthesia Plan: General   Post-op Pain Management: Tylenol PO (pre-op)*   Induction: Intravenous  PONV Risk Score and Plan: 2 and Treatment may vary due to age or medical condition, Ondansetron and Dexamethasone  Airway Management Planned: LMA  Additional Equipment: None  Intra-op Plan:   Post-operative Plan: Extubation in OR  Informed Consent: I have reviewed the patients History and Physical, chart, labs and discussed the procedure including the risks, benefits and alternatives for the proposed anesthesia with the patient or authorized  representative who has indicated his/her understanding and acceptance.     Dental advisory given  Plan Discussed with: CRNA, Anesthesiologist and Surgeon  Anesthesia Plan Comments:         Anesthesia Quick Evaluation

## 2023-11-04 NOTE — Op Note (Signed)
 Patient name: Justin Wells MRN: [id number] DOB: [birth year] Sex: male  11/04/2023 Pre-operative Diagnosis: Persistent seroma of right groin and above-knee wounds status post bypass Post-operative diagnosis:  Same Surgeon:  Luanna Salk. Randie Heinz, MD Assistant: Loel Dubonnet, PA; Filomena Jungling, MS3 Procedure Performed: 1.  Washout right groin and right above-knee wounds with 1 L normal saline Pulsavac and 1 L of Vashe 2.  Sartorius muscle flap right groin wound 3.  Application 95 cm Kerecis to right groin wound and right above-knee wound 4.  Application negative pressure dressing right groin and right above-knee wound  Indications: 73 year old male status post right common femoral to above-knee popliteal artery bypass initially performed for right lower extremity ischemic rest pain.  He has subsequently undergone I&D and wound VAC placement of his right groin above-knee wound on 2 separate occasions but unfortunately had significant drainage at the wound VAC could not keep up with.  He does have persistent swelling in the right lower extremity and has maintained on ciprofloxacin for concern of E. coli infection.  He is now indicated for repeat washout with coverage over the graft and replacement of wound vacs.  Findings: There was no evidence of infection.  Biopsy of the soft tissue was sent to evaluate for infection.  There was incorporation of most of the graft although the common femoral artery was not well incorporated and for this reason I placed Kerecis overlying the graft and placed a sartorius muscle flap over the common femoral artery and then placed a wound VAC over this to control the fluid.  Above the knee part of the graft was exposed but the anastomosis was well incorporated and Kerecis was placed over this and the wound was closed with interrupted sutures overlying the graft and wound VAC was fashioned there as well.   Procedure:  The patient was identified in the holding area and taken  to the operating room where is placed supine operative when general anesthesia was induced.  He was sterilely prepped and draped in the right lower extremity in the usual fashion, antibiotics were administered and a timeout was called.  The wound vacs have been removed prior to prepping.  We evaluated the right groin wound the graft did appear exposed.  I then washed this out with 1 L of normal saline.  I extended the incision cephalad and mobilized to the anterior superior iliac spine where the sartorius muscle flap was identified and then removed with cautery and then cautery laterally and bluntly medially.  When enough muscle was mobilized to cover the graft I then washed the groin out with Vashe fluid.  Hemostasis was obtained we did send a biopsy of the wound for culture.  I then placed one third of a 95 cm Kerecis overlying the graft and intact the muscle flap over the graft medially and cephalad with interrupted 2-0 Vicryl suture.  I was then able to close some of the soft tissue over the muscle flap with interrupted 2-0 Vicryl and then the skin cephalad was closed with staples.  A blue sponge wound VAC was fashioned and an additional one third of the 95 cm of Kerecis was placed over the muscle flap and the blue wound VAC was placed and connected to suction.  Below the knee we then washed this out with a combination of Vashe and saline.  The graft was mostly incorporated.  The remaining one third of Kerecis approximate 30 cm was placed over the graft and we then closed down  some of the soft tissue over the graft with interrupted Vicryl suture and a wound VAC was then fashioned overlying this.  This was connected to suction.  The right lower leg was then wrapped with Ace wrap to control the fluid.  The patient was then awakened from anesthesia having tolerated the procedure without any complication.  All counts were correct at completion.   EBL: 50cc   Dalen H[place] C. Randie Heinz, MD [facility] [place] Office: [phone] Pager: [phone]

## 2023-11-04 NOTE — Anesthesia Postprocedure Evaluation (Signed)
 Anesthesia Post Note  Patient: Justin Wells  Procedure(s) Performed: IRRIGATION AND DEBRIDEMENT RIGHT GROIN AND LOWER LEG WOUND (Right: Leg Lower) APPLICATION OF WOUND VAC TO RIGHT GROIN AND RIGHT LOWER LEG (Right: Groin) APPLICATION OF KERECIS (Right: Groin) CLOSURE OF RIGHT GROIN WOUND, USING MUSCLE FLAP (Right: Groin)     Patient location during evaluation: PACU Anesthesia Type: General Level of consciousness: awake and alert Pain management: pain level controlled Vital Signs Assessment: post-procedure vital signs reviewed and stable Respiratory status: spontaneous breathing, nonlabored ventilation, respiratory function stable and patient connected to nasal cannula oxygen Cardiovascular status: blood pressure returned to baseline and stable Postop Assessment: no apparent nausea or vomiting Anesthetic complications: no   No notable events documented.  Last Vitals:  Vitals:   11/04/23 1645 11/04/23 1700  BP: (!) 124/90 122/69  Pulse: 64 67  Resp: 15 16  Temp:    SpO2: 95% 95%                    Beryle Lathe

## 2023-11-05 ENCOUNTER — Encounter (HOSPITAL_COMMUNITY): Payer: Self-pay | Admitting: Vascular Surgery

## 2023-11-05 LAB — COMPREHENSIVE METABOLIC PANEL
ALT: 12 U/L (ref 0–44)
AST: 21 U/L (ref 15–41)
Albumin: 2.7 g/dL — ABNORMAL LOW (ref 3.5–5.0)
Alkaline Phosphatase: 71 U/L (ref 38–126)
Anion gap: 10 (ref 5–15)
BUN: 7 mg/dL — ABNORMAL LOW (ref 8–23)
CO2: 27 mmol/L (ref 22–32)
Calcium: 8.4 mg/dL — ABNORMAL LOW (ref 8.9–10.3)
Chloride: 100 mmol/L (ref 98–111)
Creatinine, Ser: 1.07 mg/dL (ref 0.61–1.24)
GFR, Estimated: >60 mL/min (ref >60)
Glucose, Bld: 143 mg/dL — ABNORMAL HIGH (ref 70–99)
Potassium: 4.8 mmol/L (ref 3.5–5.1)
Sodium: 137 mmol/L (ref 135–145)
Total Bilirubin: 0.8 mg/dL (ref 0.0–1.2)
Total Protein: 5.4 g/dL — ABNORMAL LOW (ref 6.5–8.1)

## 2023-11-05 LAB — CBC
HCT: 28 % — ABNORMAL LOW (ref 39.0–52.0)
Hemoglobin: 9.2 g/dL — ABNORMAL LOW (ref 13.0–17.0)
MCH: 29.5 pg (ref 26.0–34.0)
MCHC: 32.9 g/dL (ref 30.0–36.0)
MCV: 89.7 fL (ref 80.0–100.0)
Platelets: 361 10*3/uL (ref 150–400)
RBC: 3.12 MIL/uL — ABNORMAL LOW (ref 4.22–5.81)
RDW: 17.3 % — ABNORMAL HIGH (ref 11.5–15.5)
WBC: 10.4 10*3/uL (ref 4.0–10.5)
nRBC: 0 % (ref 0.0–0.2)

## 2023-11-05 LAB — PROTIME-INR
INR: 1.2 (ref 0.8–1.2)
Prothrombin Time: 15.4 s — ABNORMAL HIGH (ref 11.4–15.2)

## 2023-11-05 LAB — HIV ANTIBODY (ROUTINE TESTING W REFLEX): HIV Screen 4th Generation wRfx: NONREACTIVE

## 2023-11-05 MED ORDER — HEPARIN SODIUM (PORCINE) 5000 UNIT/ML IJ SOLN
5000.0000 [IU] | Freq: Three times a day (TID) | INTRAMUSCULAR | Status: DC
  Administered 2023-11-06: 5000 [IU] via SUBCUTANEOUS
  Filled 2023-11-05: qty 1

## 2023-11-05 MED ORDER — POTASSIUM CHLORIDE CRYS ER 10 MEQ PO TBCR
10.0000 meq | EXTENDED_RELEASE_TABLET | Freq: Every day | ORAL | Status: AC
  Administered 2023-11-05 – 2023-11-07 (×3): 10 meq via ORAL
  Filled 2023-11-05 (×3): qty 1

## 2023-11-05 MED ORDER — FUROSEMIDE 10 MG/ML IJ SOLN
20.0000 mg | Freq: Every day | INTRAMUSCULAR | Status: AC
  Administered 2023-11-05 – 2023-11-07 (×3): 20 mg via INTRAVENOUS
  Filled 2023-11-05 (×3): qty 2

## 2023-11-05 MED ORDER — CIPROFLOXACIN HCL 500 MG PO TABS
500.0000 mg | ORAL_TABLET | Freq: Two times a day (BID) | ORAL | Status: DC
  Administered 2023-11-05 – 2023-11-10 (×11): 500 mg via ORAL
  Filled 2023-11-05 (×11): qty 1

## 2023-11-05 NOTE — Progress Notes (Addendum)
 Progress Note    11/05/2023 6:50 AM 1 Day Post-Op  Subjective:  says his leg hurt so much he cried.  Wound vac lost seal overnight per RN from some bleeding from groin.  Tm 99.5 now afebrile   Vitals:   11/05/23 0050 11/05/23 0533  BP: (!) 113/58 (!) 107/55  Pulse:  71  Resp:  14  Temp: 99.5 F (37.5 C) 98.5 F (36.9 C)  SpO2:  96%    Physical Exam: General:  no distress Cardiac:  regular Lungs:  non labored Incisions:  right groin dressing removed.  Some oozing from distal incision otherwise looks clean.  Right BK vac with good seal  Right groin   Extremities:  brisk right DP/PT doppler flow.  Swelling right foot much improved Abdomen:  soft with some ecchymosis  CBC    Component Value Date/Time   WBC 10.4 11/04/2023 2349   RBC 3.12 (L) 11/04/2023 2349   HGB 9.2 (L) 11/04/2023 2349   HCT 28.0 (L) 11/04/2023 2349   PLT 361 11/04/2023 2349   MCV 89.7 11/04/2023 2349   MCH 29.5 11/04/2023 2349   MCHC 32.9 11/04/2023 2349   RDW 17.3 (H) 11/04/2023 2349   LYMPHSABS 1.8 05/08/2016 0416   MONOABS 0.8 05/08/2016 0416   EOSABS 0.4 05/08/2016 0416   BASOSABS 0.1 05/08/2016 0416    BMET    Component Value Date/Time   NA 137 11/04/2023 2349   K 4.8 11/04/2023 2349   CL 100 11/04/2023 2349   CO2 27 11/04/2023 2349   GLUCOSE 143 (H) 11/04/2023 2349   BUN 7 (L) 11/04/2023 2349   CREATININE 1.07 11/04/2023 2349   CALCIUM 8.4 (L) 11/04/2023 2349   GFRNONAA >60 11/04/2023 2349   GFRAA >60 07/18/2016 1238    INR    Component Value Date/Time   INR 1.2 11/04/2023 2349     Intake/Output Summary (Last 24 hours) at 11/05/2023 0650 Last data filed at 11/05/2023 0229 Gross per 24 hour  Intake 787.41 ml  Output 1025 ml  Net -237.59 ml       Component Ref Range & Units (hover) 1 d ago  Specimen Description TISSUE  Special Requests RIGHT GROIN  Gram Stain NO WBC SEEN NO ORGANISMS SEEN Performed at [facility], [address], [place], [place] [address]   Culture PENDING  Report Status PENDING      Assessment/Plan:  73 y.o. male is s/p:  Washout right groin and right AK wound with Sartorious muscle flap and application of Kerecis and vac placement 11/04/2023 by Dr. Randie Heinz  1 Day Post-Op   -wound vac removed from right groin overnight due to loss of seal due to bleeding and pressure dressing applied at 1am.  This was dry upon my evaluation.  I took down the dressing and there is some oozing at the distal medial portion of the wound.  Wound bed clean.  I put wet to dry dressing back on.   -given E coli and sensitive to Cipro, pharmacy recommended continuing with po Cipro until 11/10/2023 -no WBC or organisms on gram stain -DVT prophylaxis:  sq heparin ordered-will hold until this afternoon dose.    Doreatha Massed, PA-C [facility] [phone] 11/05/2023 6:50 AM  I have independently interviewed and examined patient and agree with PA assessment and plan above.  Groin wound really looks healthy but would benefit from wound VAC to control fluid.  Will also diurese today.  Plan for wound VAC change Friday.  Barton Want C. Randie Heinz, MD [facility] [place] Office: [phone] Pager: [phone]

## 2023-11-05 NOTE — TOC Initial Note (Signed)
 Transition of Care (TOC) - Initial/Assessment Note  Donn Pierini RN, BSN Transitions of Care Unit 4E- RN Case Manager See Treatment Team for direct phone #   Patient Details  Name: Justin Wells MRN: [id number] Date of Birth: 30-Nov-1950  Transition of Care [facility]) CM/SW Contact:    Darrold Span, RN Phone Number: 11/05/2023, 2:26 PM  Clinical Narrative:                 Pt re-admitted with non-healing wound, was discharged last week with [facility] arranged and home wound VAC.  HH set up w/ [facility] VAC set up w/ [facility]  CM confirmed [facility] did start of care visit on 11/03/23 as planned.   Pt from home w/ wife, TOC will follow for transition needs.   Expected Discharge Plan: Home w Home Health Services Barriers to Discharge: Continued Medical Work up   Patient Goals and CMS Choice Patient states their goals for this hospitalization and ongoing recovery are:: return home   Choice offered to / list presented to : Patient      Expected Discharge Plan and Services   Discharge Planning Services: CM Consult Post Acute Care Choice: Home Health Living arrangements for the past 2 months: Single Family Home                 DME Arranged: Vac DME Agency: [facility]   Time DME Agency Contacted:  (Pt approved for home VAC on last admit)   HH Arranged: RN HH Agency: [facility]   Time HH Agency Contacted:  (Active with [facility] arranged on last discharge)    Prior Living Arrangements/Services Living arrangements for the past 2 months: Single Family Home Lives with:: Spouse Patient language and need for interpreter reviewed:: Yes Do you feel safe going back to the place where you live?: Yes      Need for Family Participation in Patient Care: Yes (Comment) Care giver support system in place?: Yes (comment) Current home services: DME (RW) Criminal Activity/Legal Involvement Pertinent to Current Situation/Hospitalization: No - Comment as  needed  Activities of Daily Living   ADL Screening (condition at time of admission) Independently performs ADLs?: Yes (appropriate for developmental age) Is the patient deaf or have difficulty hearing?: No Does the patient have difficulty seeing, even when wearing glasses/contacts?: No Does the patient have difficulty concentrating, remembering, or making decisions?: No  Permission Sought/Granted                  Emotional Assessment              Admission diagnosis:  Non-healing surgical wound, subsequent encounter [T81.89XD] Wound of right leg [S81.801A] Patient Active Problem List   Diagnosis Date Noted   Wound of right leg 11/04/2023   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   Atherosclerosis of native arteries of extremity with rest pain (HCC) 10/14/2023   Rest pain of lower extremity due to atherosclerosis (HCC) 10/14/2023   NAFLD (nonalcoholic fatty liver disease) 52/84/1324   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   Pulmonary emphysema (HCC) 10/28/2018   Pancreatic insufficiency 05/05/2017   SVT (supraventricular tachycardia) (HCC) 12/10/2016   Focal motor seizure disorder (HCC)    Calculus of gallbladder without cholecystitis without obstruction 05/05/2016   Epigastric pain    Spells    Barrett's esophagus with dysplasia 07/20/2015   Decreased responsiveness 04/12/2015   Seizure (HCC)    Acute prostatitis 06/20/2014   Urinary urgency 06/20/2014   Anxiety 04/12/2014  Chronic cervical pain 04/12/2014   Panic attack 04/12/2014   Chest pain 03/08/2014   PAD (peripheral artery disease) (HCC) 03/08/2014   Anxiety state 03/08/2014   Chronic pain syndrome 03/08/2014   TIA (transient ischemic attack) 03/07/2014   Paresthesia 01/06/2014   Temporary cerebral vascular dysfunction 01/06/2014   Cerebral infarct (HCC) 01/06/2014   Cerebral infarction (HCC) 12/29/2013   Hypertension 12/28/2013   Other and unspecified hyperlipidemia 12/28/2013   Brainstem  infarct not seen on MRI 12/27/2013   Bad memory 11/18/2013   Special screening for malignant neoplasm of prostate 11/18/2013   Ache in joint 07/12/2013   Clinical depression 03/09/2013   Generalized OA 03/09/2013   Numbness and tingling 01/12/2013   Peripheral vascular disease, unspecified (HCC) 01/12/2013   Pain in limb 01/12/2013   Atherosclerosis of native artery of extremity with intermittent claudication (HCC) 01/12/2013   Hypoglycemia 11/05/2012   Atherosclerosis of native artery of extremity (HCC) 11/05/2012   Atherosclerosis of coronary artery 07/04/2011   Artery disease, cerebral 07/04/2011   Coronary artery disease involving native coronary artery of native heart without angina pectoris 07/04/2011   Acid reflux 05/31/2010   PCP:  John Giovanni, MD Pharmacy:   [facility] [phone] - [place], [place] - [address] AT [facility] [address] [place] [address] Phone: [phone] Fax: [phone]     Social Drivers of Health (SDOH) Social History: SDOH Screenings   Food Insecurity: Patient Declined (11/05/2023)  Housing: Patient Declined (11/05/2023)  Transportation Needs: Patient Declined (11/05/2023)  Utilities: Patient Declined (11/05/2023)  Financial Resource Strain: Low Risk  (04/05/2020)   [facility]  Physical Activity: Inactive (04/05/2020)   Received from [facility], [facility]  Social Connections: Unknown (11/05/2023)  Stress: No Stress Concern Present (04/05/2020)   Received from [facility], [facility]  Tobacco Use: Medium Risk (11/04/2023)  Health Literacy: Low Risk  (04/05/2020)   Received from [facility], [facility]   SDOH Interventions:     Readmission Risk Interventions    10/30/2023    3:05 PM 10/17/2023    4:21 PM  Readmission Risk Prevention Plan  Post Dischage Appt Complete   Medication Screening Complete   Transportation Screening Complete Complete  Home Care  Screening  Complete  Medication Review (RN CM)  Complete

## 2023-11-05 NOTE — Progress Notes (Signed)
 Changed wound vac and removed the lg clot from the black pad. Cleaned area around wound site. Good seal and drawing blood away from site.   Patient c/o of his whole leg in pain from groin to ankle. Patient was given 2 mg morphine @ 2320.    Paged Dr. Hetty Blend with the concern of the entire leg swelling and painful. Informed Dr. Hetty Blend that the wound vac was changed and at this time is pulling and maintaining seal.   Hematoma still right side lateral near groin. Pubis area also swollen and painful, tender.   Per Dr. Hetty Blend, give patient another dose of morphine and elevate foot of bed.  If wound vac does not maintain, then wet-dry dressing will be placed.   Will continue to monitor

## 2023-11-05 NOTE — Progress Notes (Signed)
 Wound vac started alarming again. Wound vac removed. Wet-to-dry dressing placed. Patient tolerated well  Patient's bed sheets and gown change.   PW placed for ease of urinating since patient can not move too much d/t pain and possible of bleeding in right groin.  Wound vac on leg still in place.   Continue to monitor

## 2023-11-05 NOTE — Plan of Care (Signed)
  Problem: Elimination: Goal: Will not experience complications related to bowel motility Outcome: Progressing Goal: Will not experience complications related to urinary retention Outcome: Progressing   

## 2023-11-05 NOTE — Progress Notes (Signed)
 Patients right groin was more swollen and tender upon reassessment suspected hematoma ,pulse dopplered PA maureen paged and she came to see the patient at bedside

## 2023-11-05 NOTE — Consult Note (Signed)
 WOC Nurse Consult Note: Reason for Consult:Right inguinal surgical wound, VAC alarming.  Right AK wound with Kerecis applied 11/04/23.  VAC has been off for longer than one hour, will replace dressing.  Wound type:surgical Pressure Injury POA: NA Measurement: right inguinal 7 cm x 9 cm x 2 cm  Right leg wound 4.5 cm x 0.5 cm x 0.5 cm  Wound JXB:JYNWG red.  Inguinal wound oozing and bled through [facility] dressing in about an hour  Large thrombus was attached to foam and make it impossible to maintain negative pressure.   Cleansed and applied pressure dressing and tape.    Drainage (amount, consistency, odor) Oozing serosanguinous right inguinal wound.  Thrombus on black foam NPWT dressing.  Removed dressing and applied kerlix and gauze with tape to apply pressure.  Periwound: creasing at proximal and distal end of groin wound. Must use barrier ring to create a flat surface and maintain seal.  Dressing procedure/placement/frequency:gauze dressing and tape. To right inguinal wound while bleeding.  NPWT (VAC) dressing with black foam to right leg wound.  See again Friday.  Dr Randie Heinz notes he will see.  Will follow.  Mike Gip MSN, RN, FNP-BC CWON Wound, Ostomy, Continence Nurse Outpatient [facility] [phone] Pager [phone]

## 2023-11-05 NOTE — Progress Notes (Signed)
 Wound vac started giving alarm of blockage ,blood clot found in the incision site in the right groin ,Karen,FNP informed ,PA Samantha notified ,advice to do wet to dry dressing in the groin,dressing applied by Clydie Braun

## 2023-11-06 ENCOUNTER — Ambulatory Visit: Payer: Medicare HMO | Admitting: Physician Assistant

## 2023-11-06 ENCOUNTER — Ambulatory Visit: Payer: Medicare HMO

## 2023-11-06 LAB — BASIC METABOLIC PANEL
Anion gap: 7 (ref 5–15)
BUN: 7 mg/dL — ABNORMAL LOW (ref 8–23)
CO2: 26 mmol/L (ref 22–32)
Calcium: 7.4 mg/dL — ABNORMAL LOW (ref 8.9–10.3)
Chloride: 102 mmol/L (ref 98–111)
Creatinine, Ser: 0.97 mg/dL (ref 0.61–1.24)
GFR, Estimated: >60 mL/min (ref >60)
Glucose, Bld: 130 mg/dL — ABNORMAL HIGH (ref 70–99)
Potassium: 4.4 mmol/L (ref 3.5–5.1)
Sodium: 135 mmol/L (ref 135–145)

## 2023-11-06 LAB — CBC
HCT: 20.9 % — ABNORMAL LOW (ref 39.0–52.0)
Hemoglobin: 7 g/dL — ABNORMAL LOW (ref 13.0–17.0)
MCH: 29.3 pg (ref 26.0–34.0)
MCHC: 33.5 g/dL (ref 30.0–36.0)
MCV: 87.4 fL (ref 80.0–100.0)
Platelets: 222 10*3/uL (ref 150–400)
RBC: 2.39 MIL/uL — ABNORMAL LOW (ref 4.22–5.81)
RDW: 17 % — ABNORMAL HIGH (ref 11.5–15.5)
WBC: 7.3 10*3/uL (ref 4.0–10.5)
nRBC: 0 % (ref 0.0–0.2)

## 2023-11-06 LAB — PREPARE RBC (CROSSMATCH)

## 2023-11-06 LAB — HEMOGLOBIN AND HEMATOCRIT, BLOOD
HCT: 25.6 % — ABNORMAL LOW (ref 39.0–52.0)
Hemoglobin: 8.5 g/dL — ABNORMAL LOW (ref 13.0–17.0)

## 2023-11-06 MED ORDER — SODIUM CHLORIDE 0.9% IV SOLUTION: Freq: Once | INTRAVENOUS | Status: AC

## 2023-11-06 MED ORDER — HEPARIN SODIUM (PORCINE) 5000 UNIT/ML IJ SOLN
5000.0000 [IU] | Freq: Three times a day (TID) | INTRAMUSCULAR | Status: DC
  Filled 2023-11-06: qty 1

## 2023-11-06 NOTE — TOC Progression Note (Signed)
 Transition of Care (TOC) - Progression Note  Donn Pierini RN, BSN Transitions of Care Unit 4E- RN Case Manager See Treatment Team for direct phone #   Patient Details  Name: Justin Wells MRN: [id number] Date of Birth: 12/15/1950  Transition of Care [facility]) CM/SW Contact  Zenda Alpers, Lenn Sink, RN Phone Number: 11/06/2023, 11:34 AM  Clinical Narrative:    Spoke with pt at bedside- per pt home VAC is at home (wife can bring it back to hospital if needed again on discharge)- told pt to have wife charge it just in case.  [facility] following for further [facility] needs- pt will need new HH orders for wound care or VAC needs.   Cm placed call to [facility] VAC liaison- [place] to have her place order for more home VAC supplies- will have them ship to home including canisters.   TOC to continue to follow    Expected Discharge Plan: Home w Home Health Services Barriers to Discharge: Continued Medical Work up  Expected Discharge Plan and Services   Discharge Planning Services: CM Consult Post Acute Care Choice: Home Health, Resumption of Svcs/PTA Provider, Durable Medical Equipment Living arrangements for the past 2 months: Single Family Home                 DME Arranged: Vac DME Agency: [facility]   Time DME Agency Contacted:  (Pt approved for home VAC on last admit)   HH Arranged: RN HH [facility]: [facility]   Time HH Agency Contacted:  (Active with [facility] arranged on last discharge)     Social Determinants of Health (SDOH) Interventions SDOH Screenings   Food Insecurity: Patient Declined (11/05/2023)  Housing: Patient Declined (11/05/2023)  Transportation Needs: Patient Declined (11/05/2023)  Utilities: Patient Declined (11/05/2023)  Financial Resource Strain: Low Risk  (04/05/2020)   [facility]  Physical Activity: Inactive (04/05/2020)   Received from [facility], [facility]  Social Connections: Unknown (11/05/2023)  Stress: No Stress Concern  Present (04/05/2020)   Received from [facility], [facility]  Tobacco Use: Medium Risk (11/04/2023)  Health Literacy: Low Risk  (04/05/2020)   Received from [facility], [facility]    Readmission Risk Interventions    10/30/2023    3:05 PM 10/17/2023    4:21 PM  Readmission Risk Prevention Plan  Post Dischage Appt Complete   Medication Screening Complete   Transportation Screening Complete Complete  Home Care Screening  Complete  Medication Review (RN CM)  Complete

## 2023-11-06 NOTE — Progress Notes (Addendum)
 Progress Note    11/06/2023 6:53 AM 2 Days Post-Op  Subjective:  resting in bed.  Says his right foot started hurting last night.    Afebrile  HR 70's-80's  100's systolic 94% RA  Vitals:   11/06/23 0049 11/06/23 0346  BP: (!) 100/59 (!) 109/57  Pulse: 74 71  Resp: 14 15  Temp: 98.3 F (36.8 C) 98.4 F (36.9 C)  SpO2: 95% 95%    Physical Exam: General:  no distress Cardiac:  regular Lungs:  non labored Incisions:  right groin clean-oozing from superior portion of wound.  BK vac with good seal.    Extremities:  + doppler flow right DP/PT; swelling continues to be improved  Abdomen:  soft  CBC    Component Value Date/Time   WBC 7.3 11/06/2023 0332   RBC 2.39 (L) 11/06/2023 0332   HGB 7.0 (L) 11/06/2023 0332   HCT 20.9 (L) 11/06/2023 0332   PLT 222 11/06/2023 0332   MCV 87.4 11/06/2023 0332   MCH 29.3 11/06/2023 0332   MCHC 33.5 11/06/2023 0332   RDW 17.0 (H) 11/06/2023 0332   LYMPHSABS 1.8 05/08/2016 0416   MONOABS 0.8 05/08/2016 0416   EOSABS 0.4 05/08/2016 0416   BASOSABS 0.1 05/08/2016 0416    BMET    Component Value Date/Time   NA 135 11/06/2023 0332   K 4.4 11/06/2023 0332   CL 102 11/06/2023 0332   CO2 26 11/06/2023 0332   GLUCOSE 130 (H) 11/06/2023 0332   BUN 7 (L) 11/06/2023 0332   CREATININE 0.97 11/06/2023 0332   CALCIUM 7.4 (L) 11/06/2023 0332   GFRNONAA >60 11/06/2023 0332   GFRAA >60 07/18/2016 1238    INR    Component Value Date/Time   INR 1.2 11/04/2023 2349     Intake/Output Summary (Last 24 hours) at 11/06/2023 0653 Last data filed at 11/05/2023 1743 Gross per 24 hour  Intake 960 ml  Output 1450 ml  Net -490 ml      Component Ref Range & Units (hover) 2 d ago  Specimen Description TISSUE  Special Requests RIGHT GROIN  Gram Stain NO WBC SEEN NO ORGANISMS SEEN  Culture NO GROWTH < 24 HOURS Performed at [facility], [address] [place], [place] [address]  Report Status PENDING     Assessment/Plan:  73  y.o. male is s/p:  Washout right groin and right AK wound with Sartorious muscle flap and application of Kerecis and vac placement 11/04/2023 by Dr. Randie Heinz   2 Days Post-Op   -pt with brisk doppler flow right foot.   -wound vac was replaced yesterday by WOC, however, the tubing clotted off therefore, wet to dry dressing place with pressure.   -acute blood loss anemia-hgb 7.0 today down from 9.2 two days ago. Will order PRBC.  Check labs in am.     -DVT prophylaxis:  sq heparin has been held.  Given the above, will hold again today.  Use SCD's.  -pt on po Cipro-so far, no growth on wound cx x 24 hours.    Doreatha Massed, PA-C [facility] [phone] 11/06/2023 6:53 AM   I have independently interviewed and examined patient and agree with PA assessment and plan above.  Unfortunately he had from the wound and I examined this at bedside does not appear to be any active bleeding just enough to clot the tubing.  As such we will hold subcutaneous heparin and SCDs and plan for washout and wound VAC placement  tomorrow in the OR with Dr. Sherral Hammers.  He will be n.p.o. past midnight.  Transfuse 1 unit today.  Brigetta Beckstrom C. Randie Heinz, MD [facility] [place] Office: [phone] Pager: [phone]

## 2023-11-06 NOTE — Evaluation (Signed)
 Occupational Therapy Evaluation Patient Details Name: Justin Wells MRN: [id number] DOB: Jan 24, 1951 Today's Date: 11/06/2023   History of Present Illness   Pt is 73 year old male who presented to [facility] on 11/04/23 due to extensive amount of serous drainage from R groin. Of note - pt s/p RLE endarterectomy and bypass graft on 10/14/23 with pt subsequently developing cellulitis with drainage from R groin incision and developing e.coli on surgical cultures leading to R groin I&D on 10/23/23 and again on 10/27/23. 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     Clinical Impressions At baseline, pt is Independent with ADLs, IADLs, and functional mobility without an AD. At baseline, pt drives and is active in the community. Pt now presents with decreased activity tolerance, decreased balance, pain in R groin affecting functional level, and decreased safety and independence with functional tasks. Pt currently demonstrates ability to complete UB ADLs Independent to Supervision, LB ADLs with Mod assist, and functional transfers with a RW with Contact guard to Min assist. Pt's VSS on RA throughout session. Pt participated well and is highly motivated to return to PLOF. Pt expected to make good progress toward goals once pain is better managed. Pt will benefit from acute skilled OT services to address deficits outlined below and to increase safety and independence with functional tasks. Post acute discharge, pt will benefit from continued skilled OT services in the home to maximize rehab potential.      If plan is discharge home, recommend the following:   A little help with walking and/or transfers;A lot of help with bathing/dressing/bathroom;Assistance with cooking/housework;Assist for transportation;Help with stairs or ramp for entrance     Functional Status Assessment   Patient has had a recent decline in their functional status and demonstrates the ability to  make significant improvements in function in a reasonable and predictable amount of time.     Equipment Recommendations   BSC/3in1     Recommendations for Other Services         Precautions/Restrictions   Precautions Precautions: Fall Recall of Precautions/Restrictions: Intact Restrictions Weight Bearing Restrictions Per Provider Order: No     Mobility Bed Mobility               General bed mobility comments: Pt sitting in recliner at beginning and end of session.    Transfers Overall transfer level: Needs assistance Equipment used: Rolling walker (2 wheels) Transfers: Sit to/from Stand, Bed to chair/wheelchair/BSC Sit to Stand: Contact guard assist     Step pivot transfers: Contact guard assist, Min assist     General transfer comment: Assist for safety and equipment management      Balance Overall balance assessment: Needs assistance Sitting-balance support: No upper extremity supported, Feet supported Sitting balance-Leahy Scale: Good     Standing balance support: Single extremity supported, Bilateral upper extremity supported, During functional activity, Reliant on assistive device for balance Standing balance-Leahy Scale: Poor Standing balance comment: Pt requiring UE support in standing/stepping with pt guarding R LE and presenting with mild L lean in standing to off load R LE due to pain.                           ADL either performed or assessed with clinical judgement   ADL Overall ADL's : Needs assistance/impaired Eating/Feeding: Independent;Sitting   Grooming: Independent;Sitting   Upper Body Bathing: Supervision/ safety;Set up;Sitting   Lower Body Bathing:  Moderate assistance;Cueing for compensatory techniques;Sitting/lateral leans;Sit to/from stand   Upper Body Dressing : Modified independent;Sitting   Lower Body Dressing: Moderate assistance;Cueing for compensatory techniques;Sitting/lateral leans;Sit to/from stand    Toilet Transfer: Contact guard assist;Minimal assistance;BSC/3in1;Rolling walker (2 wheels) (step-pivot) Toilet Transfer Details (indicate cue type and reason): simulated at [employer]- Clothing Manipulation and Hygiene: Moderate assistance;Sit to/from stand;Cueing for compensatory techniques       Functional mobility during ADLs:  (deferred this day due to pt with significant R groin pain) General ADL Comments: Pt presents with decreased activity tolerance and pain affecting functional level.     Vision Baseline Vision/History: 0 No visual deficits Ability to See in Adequate Light: 0 Adequate Patient Visual Report: No change from baseline       Perception         Praxis         Pertinent Vitals/Pain Pain Assessment Pain Assessment: 0-10 Pain Score: 9  Pain Location: R groin Pain Descriptors / Indicators: Aching, Sore, Guarding, Grimacing, Discomfort Pain Intervention(s): Limited activity within patient's tolerance, Monitored during session, Repositioned, Premedicated before session, RN gave pain meds during session     Extremity/Trunk Assessment Upper Extremity Assessment Upper Extremity Assessment: Right hand dominant;Overall WFL for tasks assessed   Lower Extremity Assessment Lower Extremity Assessment: Defer to PT evaluation   Cervical / Trunk Assessment Cervical / Trunk Assessment: Other exceptions Cervical / Trunk Exceptions: pt reports chronic low back pain   Communication Communication Communication: No apparent difficulties   Cognition Arousal: Alert Behavior During Therapy: WFL for tasks assessed/performed Cognition: No apparent impairments             OT - Cognition Comments: AAOx4 and pleasant throughout session. Pt demonstrates good safety awarenss and good insight into medical condition and deficits.                 Following commands: Intact       Cueing  General Comments   Cueing Techniques: Verbal cues (occasional, for  compensatory techniques)  VSS on RA throughout session. RN present during a portion of session.   Exercises     Shoulder Instructions      Home Living Family/patient expects to be discharged to:: Private residence Living Arrangements: Spouse/significant other Available Help at Discharge: Family;Available 24 hours/day Type of Home: House Home Access: Stairs to enter [employer] of Steps: 3 Entrance Stairs-Rails: Left Home Layout: One level     Bathroom Shower/Tub: Chief Strategy Officer: Standard     Home Equipment: Agricultural consultant (2 wheels);Other (comment) (wound vac)          Prior Functioning/Environment Prior Level of Function : Independent/Modified Independent;Driving             Mobility Comments: Independent without an AD ADLs Comments: Independent with ADLs, IADLs and drives. 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    OT Problem List: Decreased activity tolerance;Impaired balance (sitting and/or standing);Pain   OT Treatment/Interventions: Self-care/ADL training;DME and/or AE instruction;Energy conservation;Therapeutic activities;Patient/family education;Balance training      OT Goals(Current goals can be found in the care plan section)   Acute Rehab OT Goals Patient Stated Goal: to have less pain, return home, and remain independent OT Goal Formulation: With patient Time For Goal Achievement: 11/20/23 Potential to Achieve Goals: Good ADL Goals Pt Will Perform Grooming: with supervision;standing Pt Will Perform Lower Body Bathing: sitting/lateral leans;sit  to/from stand;with supervision (with adaptive equipment as needed) Pt Will Perform Lower Body Dressing: with supervision;sit to/from stand;sitting/lateral leans (with adaptive equipment as needed) Pt Will Transfer to Toilet: with modified independence;ambulating;bedside  commode (with least restrictive AD) Pt Will Perform Toileting - Clothing Manipulation and hygiene: with supervision;sit to/from stand Additional ADL Goal #1: Patient will demonstrate understanding of education related to breathing and mindfulness techniques for improved pain management through teach back with handout provided.   OT Frequency:  Min 2X/week    Co-evaluation              AM-PAC OT "6 Clicks" Daily Activity     Outcome Measure Help from another person eating meals?: None Help from another person taking care of personal grooming?: None (in sitting) Help from another person toileting, which includes using toliet, bedpan, or urinal?: A Lot Help from another person bathing (including washing, rinsing, drying)?: A Lot Help from another person to put on and taking off regular upper body clothing?: A Little Help from another person to put on and taking off regular lower body clothing?: A Lot 6 Click Score: 17   End of Session Equipment Utilized During Treatment: Rolling walker (2 wheels);Gait belt Nurse Communication: Mobility status;Other (comment) (Pain level. Pt premedicated prior to session and RN gave pain med during session.)  Activity Tolerance: Patient tolerated treatment well;Patient limited by pain Patient left: in chair;with call bell/phone within reach;with nursing/sitter in room  OT Visit Diagnosis: Other abnormalities of gait and mobility (R26.89);Pain                Time: 1610-9604 OT Time Calculation (min): 17 min Charges:  OT General Charges $OT Visit: 1 Visit OT Evaluation $OT Eval Low Complexity: 1 Low  Codie Hainer "Kyle" M., OTR/L, MA Acute Rehab [phone]   Lendon Colonel 11/06/2023, 11:29 AM

## 2023-11-06 NOTE — Evaluation (Signed)
 Physical Therapy Evaluation Patient Details Name: Justin Wells MRN: [id number] DOB: March 01, 1951 Today's Date: 11/06/2023  History of Present Illness  73 year old male presents to [facility] on 11/04/23 due to extensive amount of serous drainage from R groin. Of note - pt s/p RLE endarterectomy and bypass graft on 10/14/23 with pt subsequently developing cellulitis with drainage from R groin incision and developing e.coli on surgical cultures leading to R groin I&D on 10/23/23 and again on 10/27/23. S/p I&D, wound vac application, closure of R groin wound w/ muscle flap on 3/4. 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   Clinical Impression  Pt in bed upon arrival and agreeable to PT eval. PTA, pt was independent with no AD. In today's session, pt was able to stand and ambulate ~160 ft with RW and supervision/CGA. Pt was having significant pain in R groin, however, was very motivated to mobilize. Pt currently with functional limitations due to the deficits listed below (see PT Problem List). Pt would benefit from acute skilled PT to address functional impairments. Pt will have 24/7 physical assist available upon d/c home. Anticipate pt will have no post-acute PT needs as pain continues to be managed. Will continue to follow.         If plan is discharge home, recommend the following: Help with stairs or ramp for entrance;Assistance with cooking/housework;A little help with bathing/dressing/bathroom;A little help with walking and/or transfers;Assist for transportation   Can travel by private vehicle    Yes    Equipment Recommendations None recommended by PT     Functional Status Assessment Patient has had a recent decline in their functional status and demonstrates the ability to make significant improvements in function in a reasonable and predictable amount of time.     Precautions / Restrictions Precautions Precautions: Fall Recall of  Precautions/Restrictions: Intact Precaution/Restrictions Comments: wound vac Restrictions Weight Bearing Restrictions Per Provider Order: No      Mobility  Bed Mobility Overal bed mobility: Needs Assistance Bed Mobility: Supine to Sit, Sit to Supine    Supine to sit: Contact guard, HOB elevated Sit to supine: Contact guard assist, HOB elevated   General bed mobility comments: CGA for safety, increased time 2/2 pain in R LE    Transfers Overall transfer level: Needs assistance Equipment used: Rolling walker (2 wheels) Transfers: Sit to/from Stand Sit to Stand: Supervision    General transfer comment: supervision for safety and line management    Ambulation/Gait Ambulation/Gait assistance: Contact guard assist Gait Distance (Feet): 160 Feet Assistive device: Rolling walker (2 wheels) Gait Pattern/deviations: Step-through pattern, Decreased stance time - right Gait velocity: decr     General Gait Details: cues for RW proximity, steady gait     Balance Overall balance assessment: Needs assistance Sitting-balance support: No upper extremity supported, Feet supported Sitting balance-Leahy Scale: Good     Standing balance support: Single extremity supported, Bilateral upper extremity supported, During functional activity, Reliant on assistive device for balance Standing balance-Leahy Scale: Poor Standing balance comment: reliant on RW for stability       Pertinent Vitals/Pain Pain Assessment Pain Assessment: Faces Faces Pain Scale: Hurts whole lot Pain Location: R groin Pain Descriptors / Indicators: Aching, Sore, Guarding, Grimacing, Discomfort Pain Intervention(s): Limited activity within patient's tolerance, Premedicated before session, Monitored during session, Repositioned    Home Living Family/patient expects to be discharged to:: Private residence Living Arrangements: Spouse/significant other Available Help at Discharge: Family;Available 24 hours/day Type  of Home: House Home Access: Stairs to enter Entrance Stairs-Rails: Left Entrance Stairs-Number of Steps: 3   Home Layout: One level Home Equipment: Agricultural consultant (2 wheels);Other (comment) (wound vac)      Prior Function Prior Level of Function : Independent/Modified Independent;Driving       Mobility Comments: Independent without an AD ADLs Comments: Independent with ADLs, IADLs and drives. 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     Extremity/Trunk Assessment   Upper Extremity Assessment Upper Extremity Assessment: Defer to OT evaluation    Lower Extremity Assessment Lower Extremity Assessment: RLE deficits/detail RLE Deficits / Details: at least 3/5, deferred MMT due to pain. Decreased alertness to light touch around L4 dermatome    Cervical / Trunk Assessment Cervical / Trunk Assessment: Other exceptions Cervical / Trunk Exceptions: pt reports chronic low back pain  Communication   Communication Communication: No apparent difficulties    Cognition Arousal: Alert Behavior During Therapy: WFL for tasks assessed/performed   PT - Cognitive impairments: No apparent impairments    Following commands: Intact       Cueing Cueing Techniques: Verbal cues     General Comments General comments (skin integrity, edema, etc.): VSS on RA     PT Assessment Patient needs continued PT services  PT Problem List Decreased strength;Decreased mobility;Pain;Decreased knowledge of use of DME;Decreased activity tolerance;Decreased balance       PT Treatment Interventions DME instruction;Gait training;Stair training;Functional mobility training;Therapeutic activities;Therapeutic exercise;Patient/family education;Balance training    PT Goals (Current goals can be found in the Care Plan section)  Acute Rehab PT Goals Patient Stated Goal: to be able to walk with less pain PT  Goal Formulation: With patient Time For Goal Achievement: 11/20/23 Potential to Achieve Goals: Good    Frequency Min 3X/week        AM-PAC PT "6 Clicks" Mobility  Outcome Measure Help needed turning from your back to your side while in a flat bed without using bedrails?: A Little Help needed moving from lying on your back to sitting on the side of a flat bed without using bedrails?: A Little Help needed moving to and from a bed to a chair (including a wheelchair)?: A Little Help needed standing up from a chair using your arms (e.g., wheelchair or bedside chair)?: A Little Help needed to walk in hospital room?: A Little Help needed climbing 3-5 steps with a railing? : A Little 6 Click Score: 18    End of Session Equipment Utilized During Treatment: Gait belt Activity Tolerance: Patient tolerated treatment well Patient left: in bed;with call bell/phone within reach Nurse Communication: Mobility status PT Visit Diagnosis: Other abnormalities of gait and mobility (R26.89);Pain Pain - Right/Left: Right Pain - part of body: Leg    Time: 1610-9604 PT Time Calculation (min) (ACUTE ONLY): 19 min   Charges:   PT Evaluation $PT Eval Low Complexity: 1 Low   PT General Charges $$ ACUTE PT VISIT: 1 Visit         Hilton Cork, PT, DPT Secure Chat Preferred  Rehab Office [phone]   Arturo Morton Brion Aliment 11/06/2023, 3:22 PM

## 2023-11-07 ENCOUNTER — Encounter

## 2023-11-07 ENCOUNTER — Inpatient Hospital Stay (HOSPITAL_COMMUNITY)

## 2023-11-07 ENCOUNTER — Encounter (HOSPITAL_COMMUNITY)
Admission: AD | Disposition: A | Discharge status: Home Health | Payer: Self-pay | Source: Ambulatory Visit | Attending: Vascular Surgery

## 2023-11-07 ENCOUNTER — Other Ambulatory Visit: Payer: Self-pay

## 2023-11-07 ENCOUNTER — Encounter (HOSPITAL_COMMUNITY): Payer: Self-pay | Admitting: Vascular Surgery

## 2023-11-07 DIAGNOSIS — Z87891 Personal history of nicotine dependence: Secondary | ICD-10-CM | POA: Diagnosis not present

## 2023-11-07 DIAGNOSIS — I10 Essential (primary) hypertension: Secondary | ICD-10-CM | POA: Diagnosis not present

## 2023-11-07 DIAGNOSIS — S71101A Unspecified open wound, right thigh, initial encounter: Secondary | ICD-10-CM | POA: Diagnosis not present

## 2023-11-07 DIAGNOSIS — I2510 Atherosclerotic heart disease of native coronary artery without angina pectoris: Secondary | ICD-10-CM | POA: Diagnosis not present

## 2023-11-07 HISTORY — PX: APPLICATION OF WOUND VAC: SHX5189

## 2023-11-07 HISTORY — PX: INCISION AND DRAINAGE OF WOUND: SHX1803

## 2023-11-07 LAB — TYPE AND SCREEN
ABO/RH(D): O NEG
Antibody Screen: NEGATIVE
Unit division: 0

## 2023-11-07 LAB — CBC
HCT: 23.1 % — ABNORMAL LOW (ref 39.0–52.0)
Hemoglobin: 7.9 g/dL — ABNORMAL LOW (ref 13.0–17.0)
MCH: 29.6 pg (ref 26.0–34.0)
MCHC: 34.2 g/dL (ref 30.0–36.0)
MCV: 86.5 fL (ref 80.0–100.0)
Platelets: 199 10*3/uL (ref 150–400)
RBC: 2.67 MIL/uL — ABNORMAL LOW (ref 4.22–5.81)
RDW: 16.4 % — ABNORMAL HIGH (ref 11.5–15.5)
WBC: 6.3 10*3/uL (ref 4.0–10.5)
nRBC: 0 % (ref 0.0–0.2)

## 2023-11-07 LAB — BASIC METABOLIC PANEL
Anion gap: 3 — ABNORMAL LOW (ref 5–15)
BUN: 8 mg/dL (ref 8–23)
CO2: 26 mmol/L (ref 22–32)
Calcium: 7.4 mg/dL — ABNORMAL LOW (ref 8.9–10.3)
Chloride: 105 mmol/L (ref 98–111)
Creatinine, Ser: 0.88 mg/dL (ref 0.61–1.24)
GFR, Estimated: >60 mL/min (ref >60)
Glucose, Bld: 111 mg/dL — ABNORMAL HIGH (ref 70–99)
Potassium: 3.8 mmol/L (ref 3.5–5.1)
Sodium: 134 mmol/L — ABNORMAL LOW (ref 135–145)

## 2023-11-07 LAB — BPAM RBC
Blood Product Expiration Date: 202503112359
ISSUE DATE / TIME: 202503061047
Unit Type and Rh: 9500

## 2023-11-07 SURGERY — IRRIGATION AND DEBRIDEMENT WOUND
Anesthesia: General | Laterality: Right

## 2023-11-07 MED ORDER — OXYCODONE HCL 5 MG PO TABS
ORAL_TABLET | ORAL | Status: AC
  Administered 2023-11-07: 5 mg via ORAL
  Filled 2023-11-07: qty 1

## 2023-11-07 MED ORDER — DEXAMETHASONE SODIUM PHOSPHATE 10 MG/ML IJ SOLN
INTRAMUSCULAR | Status: AC
  Filled 2023-11-07: qty 1

## 2023-11-07 MED ORDER — FENTANYL CITRATE (PF) 100 MCG/2ML IJ SOLN
INTRAMUSCULAR | Status: AC
Start: 2023-11-07 — End: 2023-11-07
  Administered 2023-11-07: 50 ug via INTRAVENOUS
  Filled 2023-11-07: qty 2

## 2023-11-07 MED ORDER — 0.9 % SODIUM CHLORIDE (POUR BTL) OPTIME
TOPICAL | Status: DC | PRN
  Administered 2023-11-07: 1000 mL

## 2023-11-07 MED ORDER — EPHEDRINE 5 MG/ML INJ
INTRAVENOUS | Status: AC
  Filled 2023-11-07: qty 5

## 2023-11-07 MED ORDER — FENTANYL CITRATE (PF) 250 MCG/5ML IJ SOLN
INTRAMUSCULAR | Status: DC | PRN
Start: 2023-11-07 — End: 2023-11-07
  Administered 2023-11-07: 50 ug via INTRAVENOUS

## 2023-11-07 MED ORDER — DROPERIDOL 2.5 MG/ML IJ SOLN: 0.6250 mg | Freq: Once | INTRAMUSCULAR | Status: DC | PRN

## 2023-11-07 MED ORDER — OXYCODONE HCL 5 MG PO TABS
5.0000 mg | ORAL_TABLET | Freq: Once | ORAL | Status: AC | PRN
  Administered 2023-11-07: 5 mg via ORAL

## 2023-11-07 MED ORDER — LACTATED RINGERS IV SOLN: INTRAVENOUS | Status: DC | PRN

## 2023-11-07 MED ORDER — MIDAZOLAM HCL 2 MG/2ML IJ SOLN
INTRAMUSCULAR | Status: AC
  Filled 2023-11-07: qty 2

## 2023-11-07 MED ORDER — DEXAMETHASONE SODIUM PHOSPHATE 10 MG/ML IJ SOLN
INTRAMUSCULAR | Status: DC | PRN
  Administered 2023-11-07: 10 mg via INTRAVENOUS

## 2023-11-07 MED ORDER — LIDOCAINE 2% (20 MG/ML) 5 ML SYRINGE
INTRAMUSCULAR | Status: DC | PRN
  Administered 2023-11-07: 40 mg via INTRAVENOUS

## 2023-11-07 MED ORDER — DOCUSATE SODIUM 100 MG PO CAPS
100.0000 mg | ORAL_CAPSULE | Freq: Every day | ORAL | Status: DC
  Administered 2023-11-07 – 2023-11-09 (×3): 100 mg via ORAL
  Filled 2023-11-07 (×5): qty 1

## 2023-11-07 MED ORDER — CHLORHEXIDINE GLUCONATE 0.12 % MT SOLN
OROMUCOSAL | Status: AC
  Administered 2023-11-07: 15 mL
  Filled 2023-11-07: qty 15

## 2023-11-07 MED ORDER — FENTANYL CITRATE (PF) 100 MCG/2ML IJ SOLN
25.0000 ug | INTRAMUSCULAR | Status: DC | PRN
  Administered 2023-11-07: 50 ug via INTRAVENOUS

## 2023-11-07 MED ORDER — ONDANSETRON HCL 4 MG/2ML IJ SOLN
INTRAMUSCULAR | Status: AC
  Filled 2023-11-07: qty 2

## 2023-11-07 MED ORDER — CEFAZOLIN SODIUM-DEXTROSE 1-4 GM/50ML-% IV SOLN
INTRAVENOUS | Status: DC | PRN
  Administered 2023-11-07: 2 g via INTRAVENOUS

## 2023-11-07 MED ORDER — LIDOCAINE 2% (20 MG/ML) 5 ML SYRINGE
INTRAMUSCULAR | Status: AC
  Filled 2023-11-07: qty 5

## 2023-11-07 MED ORDER — FENTANYL CITRATE (PF) 250 MCG/5ML IJ SOLN
INTRAMUSCULAR | Status: AC
  Filled 2023-11-07: qty 5

## 2023-11-07 MED ORDER — PHENYLEPHRINE 80 MCG/ML (10ML) SYRINGE FOR IV PUSH (FOR BLOOD PRESSURE SUPPORT)
PREFILLED_SYRINGE | INTRAVENOUS | Status: AC
  Filled 2023-11-07: qty 10

## 2023-11-07 MED ORDER — PROPOFOL 10 MG/ML IV BOLUS
INTRAVENOUS | Status: DC | PRN
  Administered 2023-11-07: 140 mg via INTRAVENOUS

## 2023-11-07 MED ORDER — OXYCODONE HCL 5 MG/5ML PO SOLN: 5.0000 mg | Freq: Once | ORAL | Status: AC | PRN

## 2023-11-07 MED ORDER — ACETAMINOPHEN 325 MG PO TABS: 325.0000 mg | ORAL_TABLET | ORAL | Status: DC | PRN

## 2023-11-07 MED ORDER — HEPARIN SODIUM (PORCINE) 1000 UNIT/ML IJ SOLN
INTRAMUSCULAR | Status: AC
  Filled 2023-11-07: qty 10

## 2023-11-07 MED ORDER — HEPARIN SODIUM (PORCINE) 5000 UNIT/ML IJ SOLN
5000.0000 [IU] | Freq: Three times a day (TID) | INTRAMUSCULAR | Status: DC
  Administered 2023-11-08 – 2023-11-10 (×7): 5000 [IU] via SUBCUTANEOUS
  Filled 2023-11-07 (×7): qty 1

## 2023-11-07 MED ORDER — ACETAMINOPHEN 160 MG/5ML PO SOLN: 325.0000 mg | ORAL | Status: DC | PRN

## 2023-11-07 MED ORDER — ACETAMINOPHEN 10 MG/ML IV SOLN: 1000.0000 mg | Freq: Once | INTRAVENOUS | Status: DC | PRN

## 2023-11-07 MED ORDER — PROPOFOL 10 MG/ML IV BOLUS
INTRAVENOUS | Status: AC
  Filled 2023-11-07: qty 20

## 2023-11-07 MED ORDER — POLYETHYLENE GLYCOL 3350 17 G PO PACK
17.0000 g | PACK | Freq: Every day | ORAL | Status: DC
  Administered 2023-11-07 – 2023-11-09 (×3): 17 g via ORAL
  Filled 2023-11-07 (×4): qty 1

## 2023-11-07 SURGICAL SUPPLY — 48 items
APPLIER CLIP 9.375 SM OPEN (CLIP) ×1 IMPLANT
BAG COUNTER SPONGE SURGICOUNT (BAG) ×1 IMPLANT
BNDG ELASTIC 4X5.8 VLCR STR LF (GAUZE/BANDAGES/DRESSINGS) IMPLANT
BNDG ELASTIC 6INX 5YD STR LF (GAUZE/BANDAGES/DRESSINGS) IMPLANT
BNDG GAUZE DERMACEA FLUFF 4 (GAUZE/BANDAGES/DRESSINGS) IMPLANT
CANISTER SUCT 3000ML PPV (MISCELLANEOUS) ×1 IMPLANT
CLIP APPLIE 9.375 SM OPEN (CLIP) ×1 IMPLANT
CLIP TI MEDIUM 6 (CLIP) ×2 IMPLANT
CLIP TI WIDE RED SMALL 6 (CLIP) ×1 IMPLANT
CONNECTOR Y ATS VAC SYSTEM (MISCELLANEOUS) IMPLANT
COVER SURGICAL LIGHT HANDLE (MISCELLANEOUS) ×1 IMPLANT
DRAPE HALF SHEET 40X57 (DRAPES) IMPLANT
DRAPE INCISE IOBAN 66X45 STRL (DRAPES) ×1 IMPLANT
DRAPE SURG ORHT 6 SPLT 77X108 (DRAPES) ×1 IMPLANT
DRAPE U-SHAPE 76X120 STRL (DRAPES) IMPLANT
DRESSING VERAFLO CLEANS CC MED (GAUZE/BANDAGES/DRESSINGS) IMPLANT
DRSG VAC GRANUFOAM LG (GAUZE/BANDAGES/DRESSINGS) ×2 IMPLANT
DRSG VAC GRANUFOAM MED (GAUZE/BANDAGES/DRESSINGS) IMPLANT
DRSG VERAFLO CLEANSE CC MED (GAUZE/BANDAGES/DRESSINGS) ×1 IMPLANT
DRSG VERAFLO VAC MED (GAUZE/BANDAGES/DRESSINGS) IMPLANT
ELECT REM PT RETURN 9FT ADLT (ELECTROSURGICAL) ×1 IMPLANT
ELECTRODE REM PT RTRN 9FT ADLT (ELECTROSURGICAL) ×1 IMPLANT
GAUZE SPONGE 4X4 12PLY STRL (GAUZE/BANDAGES/DRESSINGS) ×1 IMPLANT
GAUZE XEROFORM 5X9 LF (GAUZE/BANDAGES/DRESSINGS) IMPLANT
GLOVE BIOGEL PI IND STRL 8 (GLOVE) ×1 IMPLANT
GOWN STRL REUS W/ TWL LRG LVL3 (GOWN DISPOSABLE) ×2 IMPLANT
GOWN STRL REUS W/TWL 2XL LVL3 (GOWN DISPOSABLE) ×2 IMPLANT
GRAFT SKIN WND SURGICLOSE M95 (Tissue) IMPLANT
IV NS IRRIG 3000ML ARTHROMATIC (IV SOLUTION) ×1 IMPLANT
KIT BASIN OR (CUSTOM PROCEDURE TRAY) ×1 IMPLANT
KIT TURNOVER KIT B (KITS) ×1 IMPLANT
NS IRRIG 1000ML POUR BTL (IV SOLUTION) ×1 IMPLANT
PACK CV ACCESS (CUSTOM PROCEDURE TRAY) IMPLANT
PACK GENERAL/GYN (CUSTOM PROCEDURE TRAY) ×1 IMPLANT
PACK UNIVERSAL I (CUSTOM PROCEDURE TRAY) ×1 IMPLANT
PAD ARMBOARD 7.5X6 YLW CONV (MISCELLANEOUS) ×2 IMPLANT
PAD NEG PRESSURE SENSATRAC (MISCELLANEOUS) IMPLANT
PULSAVAC PLUS IRRIG FAN TIP (DISPOSABLE) IMPLANT
SET HNDPC FAN SPRY TIP SCT (DISPOSABLE) IMPLANT
STAPLER VISISTAT 35W (STAPLE) IMPLANT
SUT ETHILON 2 0 PSLX (SUTURE) IMPLANT
SUT ETHILON 3 0 PS 1 (SUTURE) IMPLANT
SUT VIC AB 2-0 CTX 36 (SUTURE) IMPLANT
SUT VIC AB 3-0 SH 27X BRD (SUTURE) IMPLANT
SUT VICRYL 4-0 PS2 18IN ABS (SUTURE) IMPLANT
TIP FAN IRRIG PULSAVAC PLUS (DISPOSABLE) IMPLANT
TOWEL GREEN STERILE (TOWEL DISPOSABLE) ×1 IMPLANT
WATER STERILE IRR 1000ML POUR (IV SOLUTION) ×1 IMPLANT

## 2023-11-07 NOTE — Progress Notes (Signed)
  Progress Note    11/07/2023 7:01 AM 3 Days Post-Op  Subjective:  no complaints.  Says he walked down the hallway yesterday and sat in the chair.  Said he told the nurse he wanted to walk bc he wasn't going to be a bed potato.    Afebrile HR 60's-80's  110's-120's systolic 96% RA  Vitals:   11/06/23 2324 11/07/23 0342  BP: (!) 112/55 120/63  Pulse: 78 66  Resp: 18 16  Temp: 98.3 F (36.8 C) 98.1 F (36.7 C)  SpO2: 97% 97%    Physical Exam: General:  no distress; wakes easily Cardiac:  regular Lungs:  non labored Incisions:  right groin bandage is dry; AK vac with good seal Extremities:  + doppler flow right PT/AT/pero; mild pitting edema right leg but still improved from previous   CBC    Component Value Date/Time   WBC 6.3 11/07/2023 0332   RBC 2.67 (L) 11/07/2023 0332   HGB 7.9 (L) 11/07/2023 0332   HCT 23.1 (L) 11/07/2023 0332   PLT 199 11/07/2023 0332   MCV 86.5 11/07/2023 0332   MCH 29.6 11/07/2023 0332   MCHC 34.2 11/07/2023 0332   RDW 16.4 (H) 11/07/2023 0332   LYMPHSABS 1.8 05/08/2016 0416   MONOABS 0.8 05/08/2016 0416   EOSABS 0.4 05/08/2016 0416   BASOSABS 0.1 05/08/2016 0416    BMET    Component Value Date/Time   NA 134 (L) 11/07/2023 0332   K 3.8 11/07/2023 0332   CL 105 11/07/2023 0332   CO2 26 11/07/2023 0332   GLUCOSE 111 (H) 11/07/2023 0332   BUN 8 11/07/2023 0332   CREATININE 0.88 11/07/2023 0332   CALCIUM 7.4 (L) 11/07/2023 0332   GFRNONAA >60 11/07/2023 0332   GFRAA >60 07/18/2016 1238    INR    Component Value Date/Time   INR 1.2 11/04/2023 2349     Intake/Output Summary (Last 24 hours) at 11/07/2023 0701 Last data filed at 11/07/2023 0341 Gross per 24 hour  Intake 1371.33 ml  Output 1900 ml  Net -528.67 ml      Assessment/Plan:  73 y.o. male is s/p:  Washout right groin and right AK wound with Sartorious muscle flap and application of Kerecis and vac placement 11/04/2023 by Dr. Randie Heinz       -pt with brisk doppler  flow right foot -right groin dressing is dry -discussed with pt that they will washout his wound and replace wound vac in the OR and most likely change the above knee wound vac while he is asleep since it was changed and he expressed understanding -DVT prophylaxis:  sq being held currently.  Hopeful to restart today if no bleeding issues found in OR.  -acute blood loss anemia improved with one PRBC yesterday and hgb 7.9 from 7.0 -continue npo   Doreatha Massed, PA-C [facility] [phone] 11/07/2023 7:01 AM

## 2023-11-07 NOTE — Progress Notes (Signed)
 Pt back to 4E from PACU, c/o 10/10 pain in his R groin.  VS assessed and WNL, pt alert and oriented.  Wound vac to R groin and R thigh, running continuous at 125, minimial serosang output.  DP/PT pulses dopplerable.  Pain meds administered as per MAR. Call bell in reach.

## 2023-11-07 NOTE — Progress Notes (Signed)
 Mobility Specialist Progress Note:   11/07/23 1430  Mobility  Activity Transferred from bed to chair;Ambulated with assistance in room  Level of Assistance Standby assist, set-up cues, supervision of patient - no hands on  Assistive Device Front wheel walker  Distance Ambulated (ft) 15 ft  Activity Response Tolerated well  Mobility Referral Yes  Mobility visit 1 Mobility  Mobility Specialist Start Time (ACUTE ONLY) 1420  Mobility Specialist Stop Time (ACUTE ONLY) 1425  Mobility Specialist Time Calculation (min) (ACUTE ONLY) 5 min   Pt received in bed, agreeable to ambulate around bed and transfer to chair via RW. Tolerated well, c/o lateral RLE "burning" pain. SBA required for safety. Left pt sitting up in chair, call bell and phone in reach. All needs met.    Justin Wells Mobility Specialist Please contact via Special educational needs teacher or  Rehab office at [phone]

## 2023-11-07 NOTE — Anesthesia Procedure Notes (Signed)
 Procedure Name: LMA Insertion Date/Time: 11/07/2023 9:47 AM  Performed by: Georgianne Fick D, CRNAPre-anesthesia Checklist: Patient identified, Emergency Drugs available, Suction available and Patient being monitored Patient Re-evaluated:Patient Re-evaluated prior to induction Oxygen Delivery Method: Circle System Utilized Preoxygenation: Pre-oxygenation with 100% oxygen Induction Type: IV induction Ventilation: Mask ventilation without difficulty LMA: LMA inserted LMA Size: 5.0 Number of attempts: 1 Airway Equipment and Method: Bite block Placement Confirmation: positive ETCO2 Tube secured with: Tape Dental Injury: Teeth and Oropharynx as per pre-operative assessment

## 2023-11-07 NOTE — Progress Notes (Signed)
  Progress Note    11/07/2023 9:12 AM * Day of Surgery *  Subjective:  no complaints.  Says he walked down the hallway yesterday and sat in the chair.  Said he told the nurse he wanted to walk bc he wasn't going to be a bed potato.    Afebrile HR 60's-80's  110's-120's systolic 96% RA  Vitals:   11/07/23 0741 11/07/23 0819  BP: 120/65 114/67  Pulse: 64 72  Resp: 13 18  Temp: 98.3 F (36.8 C) 98.6 F (37 C)  SpO2: 96% 99%    Physical Exam: General:  no distress; wakes easily Cardiac:  regular Lungs:  non labored Incisions:  right groin bandage is dry; AK vac with good seal Extremities:  + doppler flow right PT/AT/pero; mild pitting edema right leg but still improved from previous   CBC    Component Value Date/Time   WBC 6.3 11/07/2023 0332   RBC 2.67 (L) 11/07/2023 0332   HGB 7.9 (L) 11/07/2023 0332   HCT 23.1 (L) 11/07/2023 0332   PLT 199 11/07/2023 0332   MCV 86.5 11/07/2023 0332   MCH 29.6 11/07/2023 0332   MCHC 34.2 11/07/2023 0332   RDW 16.4 (H) 11/07/2023 0332   LYMPHSABS 1.8 05/08/2016 0416   MONOABS 0.8 05/08/2016 0416   EOSABS 0.4 05/08/2016 0416   BASOSABS 0.1 05/08/2016 0416    BMET    Component Value Date/Time   NA 134 (L) 11/07/2023 0332   K 3.8 11/07/2023 0332   CL 105 11/07/2023 0332   CO2 26 11/07/2023 0332   GLUCOSE 111 (H) 11/07/2023 0332   BUN 8 11/07/2023 0332   CREATININE 0.88 11/07/2023 0332   CALCIUM 7.4 (L) 11/07/2023 0332   GFRNONAA >60 11/07/2023 0332   GFRAA >60 07/18/2016 1238    INR    Component Value Date/Time   INR 1.2 11/04/2023 2349     Intake/Output Summary (Last 24 hours) at 11/07/2023 0912 Last data filed at 11/07/2023 0743 Gross per 24 hour  Intake 1131.33 ml  Output 2300 ml  Net -1168.67 ml      Assessment/Plan:  73 y.o. male is s/p:  Washout right groin and right AK wound with Sartorious muscle flap and application of Kerecis and vac placement 11/04/2023 by Dr. Randie Heinz       -pt with brisk doppler flow  right foot -right groin dressing is dry -discussed with pt that they will washout his wound and replace wound vac in the OR and most likely change the above knee wound vac while he is asleep since it was changed and he expressed understanding -DVT prophylaxis:  sq being held currently.  Hopeful to restart today if no bleeding issues found in OR.  -acute blood loss anemia improved with one PRBC yesterday and hgb 7.9 from 7.0 -continue npo   Doreatha Massed, PA-C [facility] [phone] 11/07/2023 9:12 AM  VASCULAR STAFF ADDENDUM: I have independently interviewed and examined the patient. I agree with the above.  Plan for [facility] change today   Victorino Sparrow MD [facility] Phone Number: [phone] 11/07/2023 9:12 AM

## 2023-11-07 NOTE — Transfer of Care (Signed)
 Immediate Anesthesia Transfer of Care Note  Patient: Justin Wells  Procedure(s) Performed: IRRIGATION AND DEBRIDEMENT WOUND RIGHT GROIN AND RIGHT KNEE WOUND (Right) APPLICATION, WOUND VAC RIGHT GROIN AND RIGHT KNEE WOUND (Right)  Patient Location: PACU  Anesthesia Type:General  Level of Consciousness: awake, alert , and oriented  Airway & Oxygen Therapy: Patient Spontanous Breathing and Patient connected to nasal cannula oxygen  Post-op Assessment: Report given to RN and Post -op Vital signs reviewed and stable  Post vital signs: Reviewed and stable  Last Vitals:  Vitals Value Taken Time  BP 131/71 11/07/23 1025  Temp    Pulse 64 11/07/23 1026  Resp 14 11/07/23 1026  SpO2 100 % 11/07/23 1026  Vitals shown include unfiled device data.  Last Pain:  Vitals:   11/07/23 0833  TempSrc:   PainSc: 6       Patients Stated Pain Goal: 0 (11/06/23 1827)  Complications: No notable events documented.

## 2023-11-07 NOTE — Anesthesia Postprocedure Evaluation (Signed)
 Anesthesia Post Note  Patient: ZAREK RELPH  Procedure(s) Performed: IRRIGATION AND DEBRIDEMENT WOUND RIGHT GROIN AND RIGHT KNEE WOUND (Right) APPLICATION, WOUND VAC RIGHT GROIN AND RIGHT KNEE WOUND (Right)     Patient location during evaluation: PACU Anesthesia Type: General Level of consciousness: awake and alert Pain management: pain level controlled Vital Signs Assessment: post-procedure vital signs reviewed and stable Respiratory status: spontaneous breathing, nonlabored ventilation, respiratory function stable and patient connected to nasal cannula oxygen Cardiovascular status: blood pressure returned to baseline and stable Postop Assessment: no apparent nausea or vomiting Anesthetic complications: no  No notable events documented.  Last Vitals:  Vitals:   11/07/23 1045 11/07/23 1100  BP: 135/67 134/65  Pulse: 60 66  Resp: 17 15  Temp:    SpO2: 100% 93%    Last Pain:  Vitals:   11/07/23 1237  TempSrc:   PainSc: 5                  Shelton Silvas

## 2023-11-07 NOTE — Op Note (Signed)
    NAME: Justin Wells    MRN: [id number] DOB: 12-21-1950    DATE OF OPERATION: 11/07/2023  PREOP DIAGNOSIS:    Right leg groin and above-knee wounds status post bypass  POSTOP DIAGNOSIS:    Same  PROCEDURE:    Washout, irrigation, soft tissue debridement involving the adipose tissue 38 cm Kerecis to the right groin and right above-knee wound Application of negative pressure dressing right groin and right above-knee wound   SURGEON: Victorino Sparrow  ASSIST: none  ANESTHESIA: General   EBL: 5ml  INDICATIONS:    Justin Wells is a 73 y.o. male status post right common femoral to above-knee popliteal artery bypass initially performed for right lower extremity ischemic rest pain.  He has subsequently undergone incision and debridement and wound VAC placement for seroma on 3 separate occasions.  He is now presenting back to the OR for repeat washout, vacuum-assisted dressing placement  FINDINGS:   No evidence of infection Granulation tissue present No active hemorrhage, bleeding Sartorius muscle flap healthy, incorporating.  TECHNIQUE:   Patient was brought from the holding area to the operating room.  He is prepped draped standard fashion a timeout was performed.  The case began with copious irrigation with saline of both the right groin and right thigh wounds.  Following this, I performed soft tissue debridement involving a small amount of devitalized adipose tissue in the groin.  Wound size 10 x 5 x 1, thigh 10 x 3 x 1 cm  Elected to use more Kerecis to both the groin and above-knee popliteal wounds.  Wound vacs were cut to shape, and placed in both the right groin and thigh.  Impression: Healthy, incorporating sartorius muscle flap.  No significant devitalized tissue appreciated.  Small amount of devitalized tissue excised.  Victorino Sparrow, MD [facility] DATE OF DICTATION:   11/07/2023

## 2023-11-07 NOTE — Anesthesia Preprocedure Evaluation (Addendum)
 Anesthesia Evaluation  Patient identified by MRN, date of birth, ID band Patient awake    Reviewed: Allergy & Precautions, NPO status , Patient's Chart, lab work & pertinent test results  Airway Mallampati: I  TM Distance: >3 FB Neck ROM: Full    Dental  (+) Edentulous Upper, Edentulous Lower   Pulmonary COPD, former smoker   breath sounds clear to auscultation       Cardiovascular hypertension, Pt. on home beta blockers and Pt. on medications + CAD, + Cardiac Stents, + Peripheral Vascular Disease and +CHF   Rhythm:Regular Rate:Normal     Neuro/Psych  Headaches, Seizures -,  PSYCHIATRIC DISORDERS Anxiety Depression    TIACVA    GI/Hepatic Neg liver ROS, hiatal hernia,GERD  Medicated,,  Endo/Other  negative endocrine ROS    Renal/GU negative Renal ROS     Musculoskeletal  (+) Arthritis ,    Abdominal   Peds  Hematology negative hematology ROS (+)   Anesthesia Other Findings - HLD  Reproductive/Obstetrics                             Anesthesia Physical Anesthesia Plan  ASA: 3  Anesthesia Plan: General   Post-op Pain Management: Tylenol PO (pre-op)*   Induction: Intravenous  PONV Risk Score and Plan: 3 and Ondansetron, Dexamethasone and Midazolam  Airway Management Planned: LMA  Additional Equipment: None  Intra-op Plan:   Post-operative Plan: Extubation in OR  Informed Consent: I have reviewed the patients History and Physical, chart, labs and discussed the procedure including the risks, benefits and alternatives for the proposed anesthesia with the patient or authorized representative who has indicated his/her understanding and acceptance.       Plan Discussed with: CRNA  Anesthesia Plan Comments:        Anesthesia Quick Evaluation

## 2023-11-08 LAB — CBC
HCT: 22.6 % — ABNORMAL LOW (ref 39.0–52.0)
Hemoglobin: 7.5 g/dL — ABNORMAL LOW (ref 13.0–17.0)
MCH: 29 pg (ref 26.0–34.0)
MCHC: 33.2 g/dL (ref 30.0–36.0)
MCV: 87.3 fL (ref 80.0–100.0)
Platelets: 213 K/uL (ref 150–400)
RBC: 2.59 MIL/uL — ABNORMAL LOW (ref 4.22–5.81)
RDW: 16.2 % — ABNORMAL HIGH (ref 11.5–15.5)
WBC: 6.9 K/uL (ref 4.0–10.5)
nRBC: 0 % (ref 0.0–0.2)

## 2023-11-08 LAB — BASIC METABOLIC PANEL WITH GFR
Anion gap: 6 (ref 5–15)
BUN: 10 mg/dL (ref 8–23)
CO2: 27 mmol/L (ref 22–32)
Calcium: 8 mg/dL — ABNORMAL LOW (ref 8.9–10.3)
Chloride: 101 mmol/L (ref 98–111)
Creatinine, Ser: 0.88 mg/dL (ref 0.61–1.24)
GFR, Estimated: >60 mL/min
Glucose, Bld: 145 mg/dL — ABNORMAL HIGH (ref 70–99)
Potassium: 4.7 mmol/L (ref 3.5–5.1)
Sodium: 134 mmol/L — ABNORMAL LOW (ref 135–145)

## 2023-11-08 NOTE — Progress Notes (Addendum)
  Progress Note    11/08/2023 8:24 AM 1 Day Post-Op  Subjective:  no complaints   Vitals:   11/08/23 0254 11/08/23 0759  BP: (!) 98/54 127/62  Pulse: (!) 54 63  Resp: 10 15  Temp:  97.6 F (36.4 C)  SpO2: 97% 99%   Physical Exam: Cardiac:  no complaints Lungs:  non labored Incisions:  R groin and popliteal with wound vac good seal Extremities:  PT signals by doppler Neurologic: A&O  CBC    Component Value Date/Time   WBC 6.9 11/08/2023 0316   RBC 2.59 (L) 11/08/2023 0316   HGB 7.5 (L) 11/08/2023 0316   HCT 22.6 (L) 11/08/2023 0316   PLT 213 11/08/2023 0316   MCV 87.3 11/08/2023 0316   MCH 29.0 11/08/2023 0316   MCHC 33.2 11/08/2023 0316   RDW 16.2 (H) 11/08/2023 0316   LYMPHSABS 1.8 05/08/2016 0416   MONOABS 0.8 05/08/2016 0416   EOSABS 0.4 05/08/2016 0416   BASOSABS 0.1 05/08/2016 0416    BMET    Component Value Date/Time   NA 134 (L) 11/08/2023 0316   K 4.7 11/08/2023 0316   CL 101 11/08/2023 0316   CO2 27 11/08/2023 0316   GLUCOSE 145 (H) 11/08/2023 0316   BUN 10 11/08/2023 0316   CREATININE 0.88 11/08/2023 0316   CALCIUM 8.0 (L) 11/08/2023 0316   GFRNONAA >60 11/08/2023 0316   GFRAA >60 07/18/2016 1238    INR    Component Value Date/Time   INR 1.2 11/04/2023 2349     Intake/Output Summary (Last 24 hours) at 11/08/2023 0824 Last data filed at 11/08/2023 0534 Gross per 24 hour  Intake 540 ml  Output 1290 ml  Net -750 ml     Assessment/Plan:  73 y.o. male is s/p R groin washout and vac placement 1 Day Post-Op   BLE well perfused by doppler exam Wound vac groin and popliteal with good seal; next change in one week; TOC consulted to arrange [facility] and home vac Continue to monitor h&h; may need additional transfusion OOB with mobility team   Emilie Rutter, PA-C [facility] [phone] 11/08/2023 8:24 AM   VASCULAR STAFF ADDENDUM: I have independently interviewed and examined the patient. I agree with the above.  Vac  to suction, excellent signals. OOB today.  Would like to the leave the [facility] on for a week prior to removal. Possible home early this week with oupt changes.   Victorino Sparrow MD [facility] Phone Number: [phone] 11/08/2023 8:37 AM

## 2023-11-08 NOTE — Progress Notes (Signed)
   11/08/23 1116  Mobility  Activity Ambulated with assistance in hallway  Level of Assistance Contact guard assist, steadying assist  Assistive Device Front wheel walker  Distance Ambulated (ft) 100 ft  Activity Response Tolerated fair  Mobility Referral Yes  Mobility visit 1 Mobility  Mobility Specialist Start Time (ACUTE ONLY) 1059  Mobility Specialist Stop Time (ACUTE ONLY) 1116  Mobility Specialist Time Calculation (min) (ACUTE ONLY) 17 min   Mobility Specialist: Progress Note  Pre-Mobility:      HR 71 During Mobility: HR 88 Post-Mobility:    HR 75  Pt agreeable to mobility session - received in bed. C/o shooting burning pain in RLE. Returned to chair with all needs met - call bell within reach. Chair alarm on.   Barnie Mort, BS Mobility Specialist Please contact via SecureChat or  Rehab office at [phone].

## 2023-11-09 LAB — AEROBIC/ANAEROBIC CULTURE W GRAM STAIN (SURGICAL/DEEP WOUND)
Culture: NO GROWTH
Gram Stain: NONE SEEN

## 2023-11-09 LAB — CBC
HCT: 23.3 % — ABNORMAL LOW (ref 39.0–52.0)
Hemoglobin: 7.6 g/dL — ABNORMAL LOW (ref 13.0–17.0)
MCH: 28.8 pg (ref 26.0–34.0)
MCHC: 32.6 g/dL (ref 30.0–36.0)
MCV: 88.3 fL (ref 80.0–100.0)
Platelets: 215 10*3/uL (ref 150–400)
RBC: 2.64 MIL/uL — ABNORMAL LOW (ref 4.22–5.81)
RDW: 16.1 % — ABNORMAL HIGH (ref 11.5–15.5)
WBC: 6.7 10*3/uL (ref 4.0–10.5)
nRBC: 0 % (ref 0.0–0.2)

## 2023-11-09 NOTE — Plan of Care (Signed)
  Problem: Education: Goal: Knowledge of General Education information will improve Description: Including pain rating scale, medication(s)/side effects and non-pharmacologic comfort measures Outcome: Progressing   Problem: Health Behavior/Discharge Planning: Goal: Ability to manage health-related needs will improve Outcome: Progressing   Problem: Clinical Measurements: Goal: Ability to maintain clinical measurements within normal limits will improve Outcome: Progressing Goal: Will remain free from infection Outcome: Progressing Goal: Diagnostic test results will improve Outcome: Progressing Goal: Respiratory complications will improve Outcome: Progressing   Problem: Activity: Goal: Risk for activity intolerance will decrease Outcome: Progressing   Problem: Nutrition: Goal: Adequate nutrition will be maintained Outcome: Progressing   Problem: Pain Managment: Goal: General experience of comfort will improve and/or be controlled Outcome: Progressing

## 2023-11-09 NOTE — Progress Notes (Addendum)
  Progress Note    11/09/2023 9:15 AM 2 Days Post-Op  Subjective: No complaints   Vitals:   11/09/23 0312 11/09/23 0822  BP: (!) 101/51 122/70  Pulse: 60 60  Resp: 12 14  Temp: 98 F (36.7 C) 98.1 F (36.7 C)  SpO2: 95% 96%   Physical Exam: Lungs:  non labored Incisions: Right groin and popliteal incision with good seal Extremities: Brisk right PT by Doppler Neurologic: A&O  CBC    Component Value Date/Time   WBC 6.7 11/09/2023 0452   RBC 2.64 (L) 11/09/2023 0452   HGB 7.6 (L) 11/09/2023 0452   HCT 23.3 (L) 11/09/2023 0452   PLT 215 11/09/2023 0452   MCV 88.3 11/09/2023 0452   MCH 28.8 11/09/2023 0452   MCHC 32.6 11/09/2023 0452   RDW 16.1 (H) 11/09/2023 0452   LYMPHSABS 1.8 05/08/2016 0416   MONOABS 0.8 05/08/2016 0416   EOSABS 0.4 05/08/2016 0416   BASOSABS 0.1 05/08/2016 0416    BMET    Component Value Date/Time   NA 134 (L) 11/08/2023 0316   K 4.7 11/08/2023 0316   CL 101 11/08/2023 0316   CO2 27 11/08/2023 0316   GLUCOSE 145 (H) 11/08/2023 0316   BUN 10 11/08/2023 0316   CREATININE 0.88 11/08/2023 0316   CALCIUM 8.0 (L) 11/08/2023 0316   GFRNONAA >60 11/08/2023 0316   GFRAA >60 07/18/2016 1238    INR    Component Value Date/Time   INR 1.2 11/04/2023 2349     Intake/Output Summary (Last 24 hours) at 11/09/2023 0915 Last data filed at 11/09/2023 0825 Gross per 24 hour  Intake --  Output 1325 ml  Net -1325 ml     Assessment/Plan:  73 y.o. male is s/p right groin washout and VAC placement 2 Days Post-Op   Bilateral lower extremities well-perfused by Doppler exam Wound VAC to the right groin and popliteal with a good seal; [facility] will resume home health services at discharge H&H stable; continue to monitor daily CBC Possible discharge home tomorrow with home [facility]; his wife will be bringing the home unit to the hospital tomorrow   Emilie Rutter, PA-C [facility] [phone] 11/09/2023 9:15 AM  VASCULAR STAFF  ADDENDUM: I have independently interviewed and examined the patient. I agree with the above.    Victorino Sparrow MD [facility] Phone Number: [phone] 11/09/2023 9:27 AM

## 2023-11-10 ENCOUNTER — Encounter (HOSPITAL_COMMUNITY): Payer: Self-pay | Admitting: Vascular Surgery

## 2023-11-10 ENCOUNTER — Other Ambulatory Visit (HOSPITAL_COMMUNITY): Payer: Self-pay

## 2023-11-10 LAB — CBC
HCT: 22.9 % — ABNORMAL LOW (ref 39.0–52.0)
Hemoglobin: 7.4 g/dL — ABNORMAL LOW (ref 13.0–17.0)
MCH: 28.4 pg (ref 26.0–34.0)
MCHC: 32.3 g/dL (ref 30.0–36.0)
MCV: 87.7 fL (ref 80.0–100.0)
Platelets: 211 10*3/uL (ref 150–400)
RBC: 2.61 MIL/uL — ABNORMAL LOW (ref 4.22–5.81)
RDW: 15.9 % — ABNORMAL HIGH (ref 11.5–15.5)
WBC: 5.7 10*3/uL (ref 4.0–10.5)
nRBC: 0 % (ref 0.0–0.2)

## 2023-11-10 MED ORDER — CIPROFLOXACIN HCL 500 MG PO TABS
500.0000 mg | ORAL_TABLET | Freq: Two times a day (BID) | ORAL | 0 refills | Status: DC
  Filled 2023-11-10: qty 42, 21d supply, fill #0

## 2023-11-10 NOTE — Consult Note (Signed)
 WOC communicated with VVS, no WOC nurse to change VAC today due to staffing. Matt E. PA made me aware was not to be changed today, is to have next change 3/14 by [facility].  Bedside nursing to hook to home unit for DC to  home.   Updated team.  Zabdi Mis Eliberto Ivory MSN,RN,CWOCN, CNS, CWON-AP [phone]

## 2023-11-10 NOTE — Care Management Important Message (Signed)
 Important Message  Patient Details  Name: Justin Wells MRN: [id number] Date of Birth: May 23, 1951   Important Message Given:  Yes - Medicare IM     Renie Ora 11/10/2023, 11:37 AM

## 2023-11-10 NOTE — Discharge Summary (Signed)
 Discharge Summary    Justin Wells 08/07/1951 73 y.o. male  [id number]  Admission Date: 11/04/2023  Discharge Date: 11/10/2023  Physician: Juventino Slovak*  Admission Diagnosis: Non-healing surgical wound, subsequent encounter [T81.89XD] Wound of right leg [S81.801A]   HPI:   This is a 73 y.o. male  who is s/p  Right groin I&D on 10/23/23 and again on 10/27/23 by Dr. Lenell Antu. This was indicated after development of cellulitis of the RLE with drainage from groin incision. He did develop e.coli on surgical cultures and is currently on Cipro.  He is recently s/p Extensive right common femoral endarterectomy including external iliac, profunda and superficial femoral arteries with bovine pericardial patch angioplasty and Right common femoral to above-knee popliteal artery bypass graft with 6 mm ringed PTFE including right popliteal artery endarterectomy by Dr. Randie Heinz on 10/14/23.    Pt returns today for follow up with his wife.  Pt states since Friday he has had extensive amount of serous drainage from the right groin. It has been saturating his clothing and bedding. The wound VAC has not been keeping seal well because of drainage and they have filled numerous canisters because of the serous output. They ran out of supplies today because of all the canisters filling up so quickly. They report they are suppose to have more delivered to their house this morning. He denies any fever or chills. He does admit that he has not been elevating his leg and has been sitting with it in dependent position. Has a lot more swelling again in right leg and foot. He is still taking his Cipro as prescribed x 14 days. He is medically managed on Aspirin, Plavix, statin.   Hospital Course:  The patient was admitted to the hospital and taken to the operating room on 11/04/2023 and underwent: 1.  Washout right groin and right above-knee wounds with 1 L normal saline Pulsavac and 1 L of Vashe 2.  Sartorius muscle  flap right groin wound 3.  Application 95 cm Kerecis to right groin wound and right above-knee wound 4.  Application negative pressure dressing right groin and right above-knee wound    Findings: There was no evidence of infection.  Biopsy of the soft tissue was sent to evaluate for infection.  There was incorporation of most of the graft although the common femoral artery was not well incorporated and for this reason I placed Kerecis overlying the graft and placed a sartorius muscle flap over the common femoral artery and then placed a wound VAC over this to control the fluid.  Above the knee part of the graft was exposed but the anastomosis was well incorporated and Kerecis was placed over this and the wound was closed with interrupted sutures overlying the graft and wound VAC was fashioned there as well.   The pt tolerated the procedure well and was transported to the PACU in good condition.   On the evening of surgery, RN removed vac b/c seal could not be maintained.  WOC consulted to replace vac, however, due to clots in tubing, wet to dry dressing was placed back in the groin.    He returned to the OR on 11/07/2023 and underwent: Washout, irrigation, soft tissue debridement involving the adipose tissue 38 cm Kerecis to the right groin and right above-knee wound Application of negative pressure dressing right groin and right above-knee wound  Findings: No evidence of infection Granulation tissue present No active hemorrhage, bleeding Sartorius muscle flap healthy, incorporating  On 11/08/2023, vac  with good seal.  He has good doppler flow right foot.  Continues to mobilize.    11/09/2023, continues to do well with plans to discharge home on 3/10/205.   11/10/2023, pt doing well with brisk doppler flow right foot.  Wound vacs with good seal.  Plan for discharge home.    CBC    Component Value Date/Time   WBC 5.7 11/10/2023 0326   RBC 2.61 (L) 11/10/2023 0326   HGB 7.4 (L) 11/10/2023  0326   HCT 22.9 (L) 11/10/2023 0326   PLT 211 11/10/2023 0326   MCV 87.7 11/10/2023 0326   MCH 28.4 11/10/2023 0326   MCHC 32.3 11/10/2023 0326   RDW 15.9 (H) 11/10/2023 0326   LYMPHSABS 1.8 05/08/2016 0416   MONOABS 0.8 05/08/2016 0416   EOSABS 0.4 05/08/2016 0416   BASOSABS 0.1 05/08/2016 0416    BMET    Component Value Date/Time   NA 134 (L) 11/08/2023 0316   K 4.7 11/08/2023 0316   CL 101 11/08/2023 0316   CO2 27 11/08/2023 0316   GLUCOSE 145 (H) 11/08/2023 0316   BUN 10 11/08/2023 0316   CREATININE 0.88 11/08/2023 0316   CALCIUM 8.0 (L) 11/08/2023 0316   GFRNONAA >60 11/08/2023 0316   GFRAA >60 07/18/2016 1238      Discharge Instructions     Call MD for:  redness, tenderness, or signs of infection (pain, swelling, bleeding, redness, odor or green/yellow discharge around incision site)   Complete by: As directed    Call MD for:  severe or increased pain, loss or decreased feeling  in affected limb(s)   Complete by: As directed    Call MD for:  temperature >100.5   Complete by: As directed    Discharge wound care:   Complete by: As directed    Wound vac to seal Monday, Wednesday, Friday   Driving Restrictions   Complete by: As directed    No driving until returning to office for follow up.   Lifting restrictions   Complete by: As directed    No heavy lifting until returning to office for follow up.   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   Atherosclerosis of native arteries of extremity with rest pain (HCC) 10/14/2023   Rest pain of lower extremity due to atherosclerosis (HCC) 10/14/2023   NAFLD (nonalcoholic fatty liver disease) 40/98/1191   Dumping syndrome  10/28/2018   Pulmonary emphysema (HCC) 10/28/2018   Pancreatic insufficiency 05/05/2017   SVT (supraventricular tachycardia) (HCC) 12/10/2016   Focal motor seizure disorder (HCC)    Calculus of gallbladder without cholecystitis without obstruction 05/05/2016   Epigastric pain    Spells    Barrett's esophagus with dysplasia 07/20/2015   Decreased responsiveness 04/12/2015   Seizure (HCC)    Acute prostatitis 06/20/2014   Urinary urgency 06/20/2014   Anxiety 04/12/2014   Chronic cervical pain 04/12/2014   Panic attack 04/12/2014   Chest pain 03/08/2014   PAD (peripheral artery disease) (HCC) 03/08/2014   Anxiety state 03/08/2014   Chronic pain syndrome 03/08/2014   TIA (transient ischemic attack) 03/07/2014   Paresthesia 01/06/2014   Temporary cerebral vascular dysfunction 01/06/2014   Cerebral infarct (HCC) 01/06/2014  Cerebral infarction (HCC) 12/29/2013   Hypertension 12/28/2013   Other and unspecified hyperlipidemia 12/28/2013   Brainstem infarct not seen on MRI 12/27/2013   Bad memory 11/18/2013   Special screening for malignant neoplasm of prostate 11/18/2013   Ache in joint 07/12/2013   Clinical depression 03/09/2013   Generalized OA 03/09/2013   Numbness and tingling 01/12/2013   Peripheral vascular disease, unspecified (HCC) 01/12/2013   Pain in limb 01/12/2013   Atherosclerosis of native artery of extremity with intermittent claudication (HCC) 01/12/2013   Hypoglycemia 11/05/2012   Atherosclerosis of native artery of extremity (HCC) 11/05/2012   Atherosclerosis of coronary artery 07/04/2011   Artery disease, cerebral 07/04/2011   Coronary artery disease involving native coronary artery of native heart without angina pectoris 07/04/2011   Acid reflux 05/31/2010   Past Medical History:  Diagnosis Date   Anxiety    Arthritis    "hands; back; legs" (04/18/2016)   Barrett's esophagus    Cerebrovascular disease    CHF (congestive heart failure) (HCC)    "said I  had this a few years ago; it went away" (04/18/2016)   Chronic back pain    Coronary artery disease    Depression    Gastroesophageal reflux    H/O hiatal hernia    Headache(784.0)    "probably monthly" (01/27/2013)   Hyperlipidemia    Hypertension    Peripheral arterial disease (HCC)    Seizures (HCC)    "daily the last 5 days" (04/18/2016)   Stroke [facility]) 2009  2010   "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, weak right leg and hand; I've had 2 or 3 strokes; can't remember the dates" (01/27/2013)     Allergies as of 11/10/2023   No Known Allergies      Medication List     TAKE these medications    acetaminophen 500 MG tablet Commonly known as: TYLENOL Take 1,000 mg by mouth every 6 (six) hours as needed for moderate pain.   aspirin EC 81 MG tablet Take 81 mg by mouth at bedtime. Swallow whole.   atorvastatin 80 MG tablet Commonly known as: LIPITOR Take 80 mg by mouth at bedtime.   ciprofloxacin 500 MG tablet Commonly known as: CIPRO Take 1 tablet (500 mg total) by mouth 2 (two) times daily. For 11 days.   clopidogrel 75 MG tablet Commonly known as: PLAVIX Take 1 tablet by mouth once daily   DULoxetine 60 MG capsule Commonly known as: CYMBALTA Take 60 mg by mouth at bedtime.   esomeprazole 40 MG capsule Commonly known as: NEXIUM Take 40 mg by mouth in the morning and at bedtime.   finasteride 5 MG tablet Commonly known as: PROSCAR Take 5 mg by mouth in the morning.   fluticasone 50 MCG/ACT nasal spray Commonly known as: FLONASE Place 2 sprays into both nostrils in the morning and at bedtime.   gabapentin 100 MG capsule Commonly known as: NEURONTIN Take 100-200 mg by mouth See admin instructions. Take 100 mg by mouth in the morning and 200 mg by mouth at bedtime.   isosorbide mononitrate 60 MG 24 hr tablet Commonly known as: IMDUR Take 60 mg by mouth every morning.   levETIRAcetam 750 MG tablet Commonly known as: KEPPRA Take 1 tablet (750 mg total)  by mouth 2 (two) times daily. Take 1.5 tablets twice daily What changed: additional instructions   loratadine 10 MG tablet Commonly known as: CLARITIN Take 10 mg by mouth in the morning.   metoprolol succinate  25 MG 24 hr tablet Commonly known as: TOPROL-XL Take 25 mg by mouth in the morning.   mirtazapine 15 MG tablet Commonly known as: REMERON Take 15 mg by mouth at bedtime.   morphine 15 MG 12 hr tablet Commonly known as: MS CONTIN Take 15 mg by mouth 2 (two) times daily.   nitroGLYCERIN 0.4 MG SL tablet Commonly known as: NITROSTAT Place 0.4 mg under the tongue every 5 (five) minutes as needed for chest pain.   Oxycodone HCl 10 MG Tabs Take 10 mg by mouth every 4 (four) hours.   polyethylene glycol 17 g packet Commonly known as: MIRALAX / GLYCOLAX Take 17 g by mouth daily as needed for moderate constipation.   tamsulosin 0.4 MG Caps capsule Commonly known as: FLOMAX Take 0.4 mg by mouth in the morning.   valsartan 40 MG tablet Commonly known as: DIOVAN Take 40 mg by mouth in the morning.   VISINE OP Place 1 drop into both eyes daily as needed (dry eyes).               Discharge Care Instructions  (From admission, onward)           Start     Ordered   11/10/23 0000  Discharge wound care:       Comments: Wound vac to seal Monday, Wednesday, Friday   11/10/23 0846            Prescriptions given: Cipro 500mg  bid #42 no refill  Instructions: 1.  No driving until returning to our office for follow up 2.  No heavy lifting until returning to our office for follow up 3.  Wound vac changes per HH-first vac change on 11/14/2023 by [facility]  Disposition: home with [facility]  Patient's condition: is Good  Follow up: 1. [facility] in 2-3 weeks for wound check.     Doreatha Massed, PA-C [facility] [phone] 11/10/2023  8:48 AM

## 2023-11-10 NOTE — Progress Notes (Signed)
 [name] to be D/C'd home with home health per MD order. DC instructions provided by Olegario Messier, RN. Skin clean and dry, dressings to R groin and R leg clean and intact. Wound vac exchanged to home wound vac prior to discharge. TOC medications delivered to room. An After Visit Summary was printed and given to the patient.  Patient escorted via WC, and D/C home via private auto.  [name]  11/10/2023

## 2023-11-10 NOTE — Progress Notes (Addendum)
 Progress Note    11/10/2023 6:30 AM 3 Days Post-Op  Subjective:  ready to go home.  Says night before last, he could not get pain medication.  Got some last night and slept much better.  Wants to go home.  Wife is supposed to be bringing home vac to hospital.  Says his foot feels good  Afebrile HR 50's-70's NSR 110's systolic 97% RA  Vitals:   11/09/23 2241 11/10/23 0415  BP: 119/63   Pulse: 71 60  Resp: 16 15  Temp: 98.1 F (36.7 C)   SpO2: 96% 100%    Physical Exam: General:  no distress Cardiac:  regular Lungs:  non labored Incisions:  right groin and right above knee wounds with vac with good seal.   Extremities:  + right AT/PT and faint peroneal doppler flow present   CBC    Component Value Date/Time   WBC 5.7 11/10/2023 0326   RBC 2.61 (L) 11/10/2023 0326   HGB 7.4 (L) 11/10/2023 0326   HCT 22.9 (L) 11/10/2023 0326   PLT 211 11/10/2023 0326   MCV 87.7 11/10/2023 0326   MCH 28.4 11/10/2023 0326   MCHC 32.3 11/10/2023 0326   RDW 15.9 (H) 11/10/2023 0326   LYMPHSABS 1.8 05/08/2016 0416   MONOABS 0.8 05/08/2016 0416   EOSABS 0.4 05/08/2016 0416   BASOSABS 0.1 05/08/2016 0416    BMET    Component Value Date/Time   NA 134 (L) 11/08/2023 0316   K 4.7 11/08/2023 0316   CL 101 11/08/2023 0316   CO2 27 11/08/2023 0316   GLUCOSE 145 (H) 11/08/2023 0316   BUN 10 11/08/2023 0316   CREATININE 0.88 11/08/2023 0316   CALCIUM 8.0 (L) 11/08/2023 0316   GFRNONAA >60 11/08/2023 0316   GFRAA >60 07/18/2016 1238    INR    Component Value Date/Time   INR 1.2 11/04/2023 2349     Intake/Output Summary (Last 24 hours) at 11/10/2023 0630 Last data filed at 11/10/2023 9147 Gross per 24 hour  Intake 240 ml  Output 2000 ml  Net -1760 ml      Component Ref Range & Units (hover) 6 d ago  Specimen Description TISSUE  Special Requests RIGHT GROIN  Gram Stain NO WBC SEEN NO ORGANISMS SEEN  Culture No growth aerobically or anaerobically. Performed at [facility], [address], [place], [place] [address]  Report Status 11/09/2023 FINA       Assessment/Plan:  73 y.o. male is s/p:  Washout right groin and right AK wound with Sartorious muscle flap and application of Kerecis and vac placement 11/04/2023 by Dr. Randie Heinz and washout irrigation, debridement, Kerecis placement with vac placement 11/07/2023 by Dr. Karin Lieu   -pt continues to have good doppler flow right foot.  Both wounds with vac with good seal.  Cannister about half full and hasn't been changed over the weekend per patient.   -wound vac to stay in place for one week-for change later this week. -final wound cx on 11/04/2023 with no growth.  Last day of scheduled abx is this evening.  Discussed with Dr. Randie Heinz and given he has prosthetic bypass, will continue Cipro until he returns to our office for follow up in 2-3 of weeks.  -DVT prophylaxis:  sq heparin -PDMP reviewed-no pain medication given at discharge.   Doreatha Massed, PA-C [facility] [phone] 11/10/2023 6:30 AM  I have independently interviewed and examined patient and agree with PA assessment and plan above.  His  leg is very well-perfused and edema significantly improved as well.  Will plan for discharge home today on p.o. ciprofloxacin until he returns to the office with tissue culture most recently negative but previous culture susceptible to Cipro.  I have cautioned him to keep his leg elevated to prevent edema and call with any issues with the wound VAC and he demonstrates good understanding.  Layza Summa C. Randie Heinz, MD [facility] Office: [phone] Pager: [phone]

## 2023-11-10 NOTE — Progress Notes (Signed)
 Mobility Specialist Progress Note:    11/10/23 1009  Mobility  Activity Ambulated with assistance in hallway  Level of Assistance Standby assist, set-up cues, supervision of patient - no hands on  Assistive Device Front wheel walker  Distance Ambulated (ft) 400 ft  Activity Response Tolerated well  Mobility Referral Yes  Mobility visit 1 Mobility  Mobility Specialist Start Time (ACUTE ONLY) 0945  Mobility Specialist Stop Time (ACUTE ONLY) 0955  Mobility Specialist Time Calculation (min) (ACUTE ONLY) 10 min   Pt received in bed, agreeable to mobility session. Ambulated in hallway with SBA and RW. Tolerated well, asx throughout. Returned pt to room, sitting up in chair with all needs met, call bell in reach.   Feliciana Rossetti Mobility Specialist Please contact via Special educational needs teacher or  Rehab office at [phone]

## 2023-11-10 NOTE — TOC Transition Note (Signed)
 Transition of Care [facility]) - Discharge Note Donn Pierini RN, BSN Transitions of Care Unit 4E- RN Case Manager See Treatment Team for direct phone #   Patient Details  Name: Justin Wells MRN: [id number] Date of Birth: 1951/02/13  Transition of Care [facility]) CM/SW Contact:  Darrold Span, RN Phone Number: 11/10/2023, 10:10 AM   Clinical Narrative:    Pt stable for transition home today, Per [facility] pt will need home VAC change on Friday 3/14.  Wife to bring home VAC to hospital for staff to change over on discharge.  New supplies have been shipped to home per 45m liaison.   Cm has spoken with [facility] liaison to confirm staffing for [facility] - home VAC change on Friday 3/14- they will contact pt to schedule visit for wound VAC drsg change on 3/14.   CM spoke with pt at bedside to confirm resumption of [facility] services and next visit for 3/14. Pt confirmed wife is bringing home VAC.   No further TOC needs noted. Wife to transport home.    Final next level of care: Home w Home Health Services Barriers to Discharge: Barriers Resolved   Patient Goals and CMS Choice Patient states their goals for this hospitalization and ongoing recovery are:: return home CMS Medicare.gov Compare Post Acute Care list provided to:: Patient Choice offered to / list presented to : Patient      Discharge Placement               Home w/ [facility]        Discharge Plan and Services Additional resources added to the After Visit Summary for     Discharge Planning Services: CM Consult Post Acute Care Choice: Home Health, Resumption of Svcs/PTA Provider, Durable Medical Equipment          DME Arranged: Vac DME Agency: [facility]   Time DME Agency Contacted:  (Pt approved for home VAC on last admit)   HH Arranged: RN HH Agency: [facility] ([facility] Date HH Agency Contacted: 11/10/23 Time HH Agency Contacted:  (Active with [facility] arranged on last discharge) Representative spoke with at [facility] Agency:  Rolland Bimler  Social Drivers of Health (SDOH) Interventions SDOH Screenings   Food Insecurity: Patient Declined (11/05/2023)  Housing: Patient Declined (11/05/2023)  Transportation Needs: Patient Declined (11/05/2023)  Utilities: Patient Declined (11/05/2023)  Financial Resource Strain: Low Risk  (04/05/2020)   Received from [facility], [facility]  Physical Activity: Inactive (04/05/2020)   Received from [facility], [facility]  Social Connections: Unknown (11/05/2023)  Stress: No Stress Concern Present (04/05/2020)   Received from [facility], [facility]  Tobacco Use: Medium Risk (11/07/2023)  Health Literacy: Low Risk  (04/05/2020)   Received from [facility], [facility]     Readmission Risk Interventions    11/10/2023   10:10 AM 10/30/2023    3:05 PM 10/17/2023    4:21 PM  Readmission Risk Prevention Plan  Post Dischage Appt  Complete   Medication Screening  Complete   Transportation Screening Complete Complete Complete  Home Care Screening Complete  Complete  Medication Review (RN CM) Complete  Complete

## 2023-11-16 ENCOUNTER — Telehealth: Payer: Self-pay | Admitting: Vascular Surgery

## 2023-11-16 NOTE — Telephone Encounter (Signed)
 Justin Wells wife called me to report significant swelling in the bilateral lower extremities with some associated rash and redness at the ankles.  The patient's home health nurse was concerned and instructed her to call.  She denies any worrying signs of constitutional infection or local signs of infection.  He has struggled with swelling in the past.  Will plan to see him tomorrow in the PA clinic with a DVT study to ensure he does not have any venous thrombosis.  I have sent a message to the office.  I instructed the patient to call first thing in the morning.    Justin Brunt. Lenell Antu, MD [facility] Phone Number: [phone] 11/16/2023 2:40 PM

## 2023-11-17 ENCOUNTER — Ambulatory Visit (HOSPITAL_COMMUNITY)
Admission: RE | Admit: 2023-11-17 | Discharge: 2023-11-17 | Disposition: A | Discharge status: Home / Self Care | Source: Ambulatory Visit | Attending: Surgery | Admitting: Surgery

## 2023-11-17 ENCOUNTER — Other Ambulatory Visit: Payer: Self-pay

## 2023-11-17 ENCOUNTER — Other Ambulatory Visit: Payer: Self-pay | Admitting: Vascular Surgery

## 2023-11-17 ENCOUNTER — Ambulatory Visit (INDEPENDENT_AMBULATORY_CARE_PROVIDER_SITE_OTHER): Admitting: Physician Assistant

## 2023-11-17 VITALS — BP 96/56 | HR 95 | Temp 98.6°F | Ht 70.5 in | Wt 207.8 lb

## 2023-11-17 DIAGNOSIS — M7989 Other specified soft tissue disorders: Secondary | ICD-10-CM

## 2023-11-17 DIAGNOSIS — M79669 Pain in unspecified lower leg: Secondary | ICD-10-CM

## 2023-11-17 DIAGNOSIS — I739 Peripheral vascular disease, unspecified: Secondary | ICD-10-CM

## 2023-11-17 DIAGNOSIS — T8189XD Other complications of procedures, not elsewhere classified, subsequent encounter: Secondary | ICD-10-CM

## 2023-11-17 NOTE — Progress Notes (Unsigned)
 POST OPERATIVE OFFICE NOTE    CC:  F/u for surgery  HPI: Justin Wells is a 73 y.o. male who presents today as a triage visit.  He recently underwent right common femoral endarterectomy with right common femoral to above-knee popliteal artery bypass with PTFE on 10/14/2023 by Dr. Randie Heinz.  Postoperatively he developed extensive hematoma of his right above-knee incision and an infected seroma of his right groin.  He has required several irrigations and debridements of his incisions in the OR.  Most recently he underwent irrigation and debridement of his right groin with sartorius muscle flap and wound VAC change on 11/07/2023.  He returns today as a triage visit.  He called our office line this weekend reporting an increase in swelling in both of his legs since his discharge from the hospital.  Of note, he has had extensive right lower extremity revascularization swelling since his bypass.  At follow-up today, he says he is doing okay.  He denies any pain in his lower extremities.  He says he has had some left foot and ankle swelling.  Most of his swelling is in the right lower leg and foot.  He has noticed some redness around his right foot and lower leg, however this has not spread to the upper leg.  He denies any pain to touch to the leg.  He denies any fevers or chills.  He says that his wound VAC has been working well and home health is coming out later today for a wound VAC change.  He says he has to change his canister every 2 to 3 days right now.  No Known Allergies  Current Outpatient Medications  Medication Sig Dispense Refill   acetaminophen (TYLENOL) 500 MG tablet Take 1,000 mg by mouth every 6 (six) hours as needed for moderate pain.     aspirin EC 81 MG tablet Take 81 mg by mouth at bedtime. Swallow whole.     atorvastatin (LIPITOR) 80 MG tablet Take 80 mg by mouth at bedtime.     ciprofloxacin (CIPRO) 500 MG tablet Take 1 tablet (500 mg total) by mouth 2 (two) times daily. 42 tablet 0    DULoxetine (CYMBALTA) 60 MG capsule Take 60 mg by mouth at bedtime.     esomeprazole (NEXIUM) 40 MG capsule Take 40 mg by mouth in the morning and at bedtime.     finasteride (PROSCAR) 5 MG tablet Take 5 mg by mouth in the morning.     fluticasone (FLONASE) 50 MCG/ACT nasal spray Place 2 sprays into both nostrils in the morning and at bedtime.     gabapentin (NEURONTIN) 100 MG capsule Take 100-200 mg by mouth See admin instructions. Take 100 mg by mouth in the morning and 200 mg by mouth at bedtime.     isosorbide mononitrate (IMDUR) 60 MG 24 hr tablet Take 60 mg by mouth every morning.     levETIRAcetam (KEPPRA) 750 MG tablet Take 1 tablet (750 mg total) by mouth 2 (two) times daily. Take 1.5 tablets twice daily (Patient taking differently: Take 750 mg by mouth 2 (two) times daily.) 60 tablet 1   loratadine (CLARITIN) 10 MG tablet Take 10 mg by mouth in the morning.     metoprolol succinate (TOPROL-XL) 25 MG 24 hr tablet Take 25 mg by mouth in the morning.     mirtazapine (REMERON) 15 MG tablet Take 15 mg by mouth at bedtime.     morphine (MS CONTIN) 15 MG 12 hr tablet Take 15  mg by mouth 2 (two) times daily.     nitroGLYCERIN (NITROSTAT) 0.4 MG SL tablet Place 0.4 mg under the tongue every 5 (five) minutes as needed for chest pain.     Oxycodone HCl 10 MG TABS Take 10 mg by mouth every 4 (four) hours.     polyethylene glycol (MIRALAX / GLYCOLAX) 17 g packet Take 17 g by mouth daily as needed for moderate constipation.     tamsulosin (FLOMAX) 0.4 MG CAPS capsule Take 0.4 mg by mouth in the morning.     Tetrahydrozoline HCl (VISINE OP) Place 1 drop into both eyes daily as needed (dry eyes).     valsartan (DIOVAN) 40 MG tablet Take 40 mg by mouth in the morning.     clopidogrel (PLAVIX) 75 MG tablet Take 1 tablet by mouth once daily (Patient not taking: Reported on 11/17/2023) 90 tablet 3   No current facility-administered medications for this visit.     ROS:  See HPI  Physical  Exam:   Incision: Right groin and right above-knee popliteal bridged wound VAC with good seal, SS output in canister Extremities: 2+ edema of right lower extremity with mild ankle and foot erythema.  1+ edema of left ankle and foot.  No tenderness to palpation of bilateral lower extremities.  Brisk right PT/PT Doppler signals Neuro: Intact motor and sensation of bilateral lower extremities  BLE DVT Study (11/17/2023) No evidence of DVT in bilateral lower extremities    Assessment/Plan:  This is a 73 y.o. male who presents today as a triage visit  -The patient recently underwent right common femoral to above-knee popliteal artery bypass with PTFE.  Postoperatively he developed an infected right groin seroma, prompting irrigation and debridement of both incisions.  Since then, he has required several washouts of both infections and recent right sartorius muscle flap -He returns today as a triage visit.  He states since going home a couple days ago he has had an increase in his lower extremity swelling, mostly on the right.  He has also noticed a little bit of redness to his right lower leg and foot.  -He denies any fevers or chills at home.  He denies any issues with his wound VAC.  His wounds have looked good with home health nursing changes.  He states that they are coming out later today for a wound VAC change -On exam he has 1+ pitting edema around his left ankle and foot.  He has 2+ edema of his right lower leg, ankle, and foot.  There is mild erythema of his right ankle and foot, which blanches with pressure.  His leg is not warm to touch, and I have low concern for cellulitis -Ultrasound today demonstrates no evidence of DVT in bilateral lower extremities.  I have encouraged him to continue to elevate his legs at home to help with swelling.  He has not worn his compression stockings since his most recent discharge, so I encouraged him to start wearing these every day to help with  swelling. -Home health will continue with 2-3 weekly wound VAC changes.  He will keep his follow-up with our office on April 2 for repeat wound check   Loel Dubonnet, PA-C [facility] [phone]   Clinic MD:  Justin Wells

## 2023-11-20 LAB — FUNGUS CULTURE WITH STAIN

## 2023-11-20 LAB — FUNGUS CULTURE RESULT

## 2023-11-20 LAB — FUNGAL ORGANISM REFLEX

## 2023-12-01 ENCOUNTER — Telehealth: Payer: Self-pay | Admitting: *Deleted

## 2023-12-01 NOTE — Telephone Encounter (Signed)
 wound care RN called stated wound has healed and no longer needs wound vac. Pt now has Wet to dry and has a follow up this week

## 2023-12-03 ENCOUNTER — Ambulatory Visit (INDEPENDENT_AMBULATORY_CARE_PROVIDER_SITE_OTHER): Admitting: Physician Assistant

## 2023-12-03 VITALS — BP 93/51 | HR 67 | Temp 97.2°F | Ht 70.0 in | Wt 207.3 lb

## 2023-12-03 DIAGNOSIS — I739 Peripheral vascular disease, unspecified: Secondary | ICD-10-CM

## 2023-12-03 DIAGNOSIS — T8189XD Other complications of procedures, not elsewhere classified, subsequent encounter: Secondary | ICD-10-CM

## 2023-12-03 NOTE — Progress Notes (Signed)
 POST OPERATIVE OFFICE NOTE    CC:  F/u for surgery  HPI:  Justin Wells is a 73 y.o. male who presents today for wound check.  He recently underwent right common femoral endarterectomy with right common femoral to above-knee popliteal artery bypass with PTFE on 10/14/2023 by Dr. Randie Heinz.  Postoperatively he developed a hematoma of his right above-knee incision and infected seroma of his right groin incision.  He has required several irrigations and debridements of his incisions in the OR, most recently with sartorius muscle flap and wound VAC change on 11/07/2023.  He is also dealt with extensive right lower extremity swelling postoperatively.  He returns today for follow-up.  He says that he continues to improve after surgery.  He denies any pain in his lower extremities.  He continues to have right lower extremity swelling, which he tries to treat with leg elevation.  He does not use any form of compression since his previous compression stockings are too loose now.  He states last Friday his right above-knee incision was transitioned to wet-to-dry daily dressings.  He is still receiving 2-3 times weekly right groin wound VAC changes.   No Known Allergies  Current Outpatient Medications  Medication Sig Dispense Refill   acetaminophen (TYLENOL) 500 MG tablet Take 1,000 mg by mouth every 6 (six) hours as needed for moderate pain.     aspirin EC 81 MG tablet Take 81 mg by mouth at bedtime. Swallow whole.     atorvastatin (LIPITOR) 80 MG tablet Take 80 mg by mouth at bedtime.     ciprofloxacin (CIPRO) 500 MG tablet Take 1 tablet (500 mg total) by mouth 2 (two) times daily. 42 tablet 0   DULoxetine (CYMBALTA) 60 MG capsule Take 60 mg by mouth at bedtime.     esomeprazole (NEXIUM) 40 MG capsule Take 40 mg by mouth in the morning and at bedtime.     finasteride (PROSCAR) 5 MG tablet Take 5 mg by mouth in the morning.     fluticasone (FLONASE) 50 MCG/ACT nasal spray Place 2 sprays into both nostrils in  the morning and at bedtime.     gabapentin (NEURONTIN) 100 MG capsule Take 100-200 mg by mouth See admin instructions. Take 100 mg by mouth in the morning and 200 mg by mouth at bedtime.     isosorbide mononitrate (IMDUR) 60 MG 24 hr tablet Take 60 mg by mouth every morning.     levETIRAcetam (KEPPRA) 750 MG tablet Take 1 tablet (750 mg total) by mouth 2 (two) times daily. Take 1.5 tablets twice daily (Patient taking differently: Take 750 mg by mouth 2 (two) times daily.) 60 tablet 1   loratadine (CLARITIN) 10 MG tablet Take 10 mg by mouth in the morning.     metoprolol succinate (TOPROL-XL) 25 MG 24 hr tablet Take 25 mg by mouth in the morning.     mirtazapine (REMERON) 15 MG tablet Take 15 mg by mouth at bedtime.     morphine (MS CONTIN) 15 MG 12 hr tablet Take 15 mg by mouth 2 (two) times daily.     Oxycodone HCl 10 MG TABS Take 10 mg by mouth every 4 (four) hours.     tamsulosin (FLOMAX) 0.4 MG CAPS capsule Take 0.4 mg by mouth in the morning.     Tetrahydrozoline HCl (VISINE OP) Place 1 drop into both eyes daily as needed (dry eyes).     valsartan (DIOVAN) 40 MG tablet Take 40 mg by mouth in the morning.  clopidogrel (PLAVIX) 75 MG tablet Take 1 tablet by mouth once daily (Patient not taking: Reported on 12/03/2023) 90 tablet 3   nitroGLYCERIN (NITROSTAT) 0.4 MG SL tablet Place 0.4 mg under the tongue every 5 (five) minutes as needed for chest pain.     polyethylene glycol (MIRALAX / GLYCOLAX) 17 g packet Take 17 g by mouth daily as needed for moderate constipation.     No current facility-administered medications for this visit.     ROS:  See HPI  Physical Exam:  Incision: Right groin wound much smaller in size with healthy granulation tissue.  Right above-knee incision also much smaller.  Both wounds without signs of infection Extremities: 2+ edema of right lower extremity.  Brisk right PT/DP Doppler signals Neuro: Intact motor and sensation of right lower  extremity        Assessment/Plan:  This is a 73 y.o. male who presents today for wound check  -The patient continues to heal from several required irrigation and debridements of his right groin and right above-knee incisions -He is right above-knee incision is now much smaller and has been converted to daily wet-to-dry dressing changes.  His right groin wound is also much smaller with very healthy granulation tissue.  There are no signs of infection present in either wound bed -His right lower extremity is well-perfused with brisk DP/PT Doppler signals -His right lower extremity remains edematous.  He is elevating the leg but not using any forms of compression.  He says he cannot afford any new compression stockings right now. -I have removed the remaining 3 staples from his right groin.  I have also replaced a new wound VAC on his right groin and wet-to-dry dressing on his right leg.  I have recommended that he continue to get daily wet-to-dry dressing changes to his right leg wound and 2-3x weekly wound VAC changes to the right groin.  I have also wrapped his leg from ankle to thigh with an Ace wrap to help with his edema. -He can follow-up with our office in 3 to 4 weeks for repeat wound check   Loel Dubonnet, PA-C [facility] [phone]   Clinic MD:  Randie Heinz

## 2023-12-11 ENCOUNTER — Telehealth: Payer: Self-pay

## 2023-12-11 NOTE — Telephone Encounter (Signed)
 [facility] [place], [place] [facility] called for wound care verbal orders. -VO given: W-T-D dressing changes to R thigh proximal incision.  Continue wound vac to R groin wound until appropriate, then change to calcium alginate, Vaseline guaze and dry dressing until follow up appt on 12/31/23

## 2023-12-31 ENCOUNTER — Ambulatory Visit: Attending: Vascular Surgery | Admitting: Physician Assistant

## 2023-12-31 VITALS — BP 144/83 | HR 68 | Temp 98.1°F | Wt 203.6 lb

## 2023-12-31 DIAGNOSIS — T8189XD Other complications of procedures, not elsewhere classified, subsequent encounter: Secondary | ICD-10-CM

## 2023-12-31 NOTE — Progress Notes (Signed)
 POST OPERATIVE OFFICE NOTE    CC:  F/u for surgery  HPI:  Justin Wells is a 73 y.o. male who returns for repeat wound check. He recently underwent right common femoral endarterectomy with right common femoral to above-knee popliteal artery bypass with PTFE on 10/14/2023 by Dr. Vikki Graves.  Postoperatively he developed a hematoma of his right above-knee incision and infected seroma of his right groin incision.  He has required several irrigations and debridements of his incisions in the OR, most recently with sartorius muscle flap and wound VAC change on 11/07/2023.  He is also dealt with extensive right lower extremity swelling postoperatively.   He returns today for follow-up.  He is doing well at today's visit.  His right above-knee incision is essentially healed now and no longer requires any dressing changes.  His right groin wound continues to get much smaller.  He denies any puslike drainage from this area, erythema, or tenderness.  He continues daily wet-to-dry dressing changes to this groin wound.  He still has issues with right lower extremity swelling, however this is getting better.  He denies any claudication, rest pain, or tissue loss.   No Known Allergies  Current Outpatient Medications  Medication Sig Dispense Refill   acetaminophen  (TYLENOL ) 500 MG tablet Take 1,000 mg by mouth every 6 (six) hours as needed for moderate pain.     aspirin  EC 81 MG tablet Take 81 mg by mouth at bedtime. Swallow whole.     atorvastatin  (LIPITOR ) 80 MG tablet Take 80 mg by mouth at bedtime.     ciprofloxacin  (CIPRO ) 500 MG tablet Take 1 tablet (500 mg total) by mouth 2 (two) times daily. 42 tablet 0   clopidogrel  (PLAVIX ) 75 MG tablet Take 1 tablet by mouth once daily (Patient not taking: Reported on 12/03/2023) 90 tablet 3   DULoxetine  (CYMBALTA ) 60 MG capsule Take 60 mg by mouth at bedtime.     esomeprazole (NEXIUM) 40 MG capsule Take 40 mg by mouth in the morning and at bedtime.     finasteride  (PROSCAR )  5 MG tablet Take 5 mg by mouth in the morning.     fluticasone  (FLONASE ) 50 MCG/ACT nasal spray Place 2 sprays into both nostrils in the morning and at bedtime.     gabapentin  (NEURONTIN ) 100 MG capsule Take 100-200 mg by mouth See admin instructions. Take 100 mg by mouth in the morning and 200 mg by mouth at bedtime.     isosorbide  mononitrate (IMDUR ) 60 MG 24 hr tablet Take 60 mg by mouth every morning.     levETIRAcetam  (KEPPRA ) 750 MG tablet Take 1 tablet (750 mg total) by mouth 2 (two) times daily. Take 1.5 tablets twice daily (Patient taking differently: Take 750 mg by mouth 2 (two) times daily.) 60 tablet 1   loratadine  (CLARITIN ) 10 MG tablet Take 10 mg by mouth in the morning.     metoprolol  succinate (TOPROL -XL) 25 MG 24 hr tablet Take 25 mg by mouth in the morning.     mirtazapine  (REMERON ) 15 MG tablet Take 15 mg by mouth at bedtime.     morphine  (MS CONTIN ) 15 MG 12 hr tablet Take 15 mg by mouth 2 (two) times daily.     nitroGLYCERIN  (NITROSTAT ) 0.4 MG SL tablet Place 0.4 mg under the tongue every 5 (five) minutes as needed for chest pain.     Oxycodone  HCl 10 MG TABS Take 10 mg by mouth every 4 (four) hours.     polyethylene glycol (  MIRALAX  / GLYCOLAX ) 17 g packet Take 17 g by mouth daily as needed for moderate constipation.     tamsulosin  (FLOMAX ) 0.4 MG CAPS capsule Take 0.4 mg by mouth in the morning.     Tetrahydrozoline HCl (VISINE OP) Place 1 drop into both eyes daily as needed (dry eyes).     valsartan (DIOVAN) 40 MG tablet Take 40 mg by mouth in the morning.     No current facility-administered medications for this visit.     ROS:  See HPI  Physical Exam:  Incision: Right groin wound much smaller in appearance, with healthy tissue present.  Right above-knee incision essentially healed Extremities: 1+ edema of right lower extremity.  Brisk right DP/PT Doppler signals Neuro: Intact motor and sensation of right lower extremity       Assessment/Plan:  This is a 73  y.o. male who is here for repeat wound check  - The patient continues to heal well after several irrigation and debridements of his right groin and above-knee incisions - His right above-knee incision is now completely healed without signs of infection -His right groin wound is now significantly smaller without any signs of infection such as puslike drainage, erythema, or tenderness - His right lower extremity remains well-perfused with brisk DP/PT Doppler signals - I have recommended that he continue daily wet-to-dry dressing changes to his right groin wound until it scabs over. - He can follow-up with our office in 3 to 4 weeks for repeat wound check.  At that time hopefully his groin is healed and he can obtain his first postop ABIs and right lower extremity bypass graft duplex   Deneise Finlay, PA-C [facility] [phone]   Clinic MD:  Vikki Graves

## 2024-01-08 ENCOUNTER — Other Ambulatory Visit: Payer: Self-pay | Admitting: *Deleted

## 2024-01-08 DIAGNOSIS — I70221 Atherosclerosis of native arteries of extremities with rest pain, right leg: Secondary | ICD-10-CM

## 2024-01-08 DIAGNOSIS — I739 Peripheral vascular disease, unspecified: Secondary | ICD-10-CM

## 2024-01-13 ENCOUNTER — Encounter (HOSPITAL_COMMUNITY): Payer: Self-pay | Admitting: Vascular Surgery

## 2024-01-21 ENCOUNTER — Telehealth: Payer: Self-pay

## 2024-01-21 NOTE — Telephone Encounter (Signed)
 Returned [facility] call regarding when pt's wound vac was d/c. They are requesting clinical records with this information. This was both faxed and emailed to them.

## 2024-01-27 ENCOUNTER — Telehealth: Payer: Self-pay

## 2024-01-27 NOTE — Telephone Encounter (Signed)
 Pt's HH nurse called with pt present with c/o ongoing swelling now with weeping blisters of RLE. [facility] nurse feels like it is the beginning of cellulitis. Pt recently finished course of Doxycycline. He was seen elsewhere and prescribed unna boot. HH nurse is going to put unna boot on today but would like him seen. Pt added on tomorrow to APP schedule.

## 2024-01-28 ENCOUNTER — Ambulatory Visit: Attending: Vascular Surgery | Admitting: Physician Assistant

## 2024-01-28 VITALS — BP 115/80 | HR 84 | Temp 98.3°F | Ht 70.0 in | Wt 203.2 lb

## 2024-01-28 DIAGNOSIS — M7989 Other specified soft tissue disorders: Secondary | ICD-10-CM

## 2024-01-28 DIAGNOSIS — I739 Peripheral vascular disease, unspecified: Secondary | ICD-10-CM

## 2024-01-28 NOTE — Progress Notes (Signed)
 POST OPERATIVE OFFICE NOTE    CC:  F/u for surgery  HPI:  Justin Wells is a 73 y.o. male who returns for repeat wound check. He recently underwent right common femoral endarterectomy with right common femoral to above-knee popliteal artery bypass with PTFE on 10/14/2023 by Dr. Vikki Graves.  Postoperatively he developed a hematoma of his right above-knee incision and infected seroma of his right groin incision.  He has required several irrigations and debridements of his incisions in the OR, most recently with sartorius muscle flap and wound VAC change on 11/07/2023.  He is also dealt with extensive right lower extremity swelling postoperatively.  At his last office visit, his wounds were getting much smaller without signs of infection.  He returns today as a triage visit.  He says that his home health nurse was concerned about possible cellulitis in his right lower leg.  He says that he just finished a course of doxycycline.  About a week ago, several edema blisters popped up on his right lower leg and then popped open.  These have slowly been closing and have a small amount of redness around them.  Unfortunately he continues to have chronic right lower leg swelling after his bypass.   No Known Allergies  Current Outpatient Medications  Medication Sig Dispense Refill   acetaminophen  (TYLENOL ) 500 MG tablet Take 1,000 mg by mouth every 6 (six) hours as needed for moderate pain.     aspirin  EC 81 MG tablet Take 81 mg by mouth at bedtime. Swallow whole.     atorvastatin  (LIPITOR ) 80 MG tablet Take 80 mg by mouth at bedtime.     clopidogrel  (PLAVIX ) 75 MG tablet Take 1 tablet by mouth once daily 90 tablet 3   DULoxetine  (CYMBALTA ) 60 MG capsule Take 60 mg by mouth at bedtime.     esomeprazole (NEXIUM) 40 MG capsule Take 40 mg by mouth in the morning and at bedtime.     finasteride  (PROSCAR ) 5 MG tablet Take 5 mg by mouth in the morning.     fluticasone  (FLONASE ) 50 MCG/ACT nasal spray Place 2 sprays into  both nostrils in the morning and at bedtime.     furosemide  (LASIX ) 20 MG tablet Take 1 tablet by mouth daily.     gabapentin  (NEURONTIN ) 100 MG capsule Take 100-200 mg by mouth See admin instructions. Take 100 mg by mouth in the morning and 200 mg by mouth at bedtime.     isosorbide  mononitrate (IMDUR ) 60 MG 24 hr tablet Take 60 mg by mouth every morning.     levETIRAcetam  (KEPPRA ) 750 MG tablet Take 1 tablet (750 mg total) by mouth 2 (two) times daily. Take 1.5 tablets twice daily (Patient taking differently: Take 750 mg by mouth 2 (two) times daily.) 60 tablet 1   loratadine  (CLARITIN ) 10 MG tablet Take 10 mg by mouth in the morning.     metoprolol  succinate (TOPROL -XL) 25 MG 24 hr tablet Take 25 mg by mouth in the morning.     mirtazapine  (REMERON ) 15 MG tablet Take 15 mg by mouth at bedtime.     morphine  (MS CONTIN ) 15 MG 12 hr tablet Take 15 mg by mouth 2 (two) times daily.     nitroGLYCERIN  (NITROSTAT ) 0.4 MG SL tablet Place 0.4 mg under the tongue every 5 (five) minutes as needed for chest pain.     Oxycodone  HCl 10 MG TABS Take 10 mg by mouth every 4 (four) hours.     polyethylene glycol (MIRALAX  /  GLYCOLAX ) 17 g packet Take 17 g by mouth daily as needed for moderate constipation.     tamsulosin  (FLOMAX ) 0.4 MG CAPS capsule Take 0.4 mg by mouth in the morning.     Tetrahydrozoline HCl (VISINE OP) Place 1 drop into both eyes daily as needed (dry eyes).     valsartan (DIOVAN) 40 MG tablet Take 40 mg by mouth in the morning.     No current facility-administered medications for this visit.     ROS:  See HPI  Physical Exam:   Incision: Right groin and right above-knee wounds healed without erythema or drainage Extremities: Nonpitting edema of right lower leg.  Several small, open blisters around the right lower leg with mild surrounding erythema.  Brisk right DP/PT Doppler signals Neuro: Intact motor and sensation of right lower leg          Assessment/Plan:  This is a 73 y.o.  male who is here as a triage visit  - The patient is s/p right femoral to above-knee popliteal artery bypass with PTFE.  His postoperative course has been complicated by infection requiring wound washout and VAC placement.  At today's visit, his right groin and above-knee wounds are now completely healed -His right lower extremity remains well-perfused with brisk DP/PT Doppler signals -He continues to have fluctuating severity of his right lower leg edema since his bypass.  He has been trying to wear compression stockings every day.  He says about a week ago several blisters popped up on his right lower leg and eventually popped open.  He endorses serous drainage from these blisters.  He denies any fevers or tenderness to the leg - On exam he has several small open blisters to his right lower leg.  There is no marked erythema of the right lower leg to suggest cellulitis.  I believe these blisters popped up due to his ongoing right lower extremity edema.  We will plan for once weekly Unna boot changes to the right lower leg so he can heal his blisters. - He can return to clinic in 3 to 4 weeks for repeat wound check.   Deneise Finlay, PA-C [facility] [phone]   Clinic MD:  Vikki Graves

## 2024-02-09 ENCOUNTER — Ambulatory Visit: Payer: Medicare Other | Admitting: Neurology

## 2024-02-11 ENCOUNTER — Encounter (HOSPITAL_COMMUNITY)

## 2024-02-11 ENCOUNTER — Other Ambulatory Visit (HOSPITAL_COMMUNITY)

## 2024-02-11 ENCOUNTER — Ambulatory Visit

## 2024-03-10 ENCOUNTER — Ambulatory Visit (HOSPITAL_COMMUNITY)
Admission: RE | Admit: 2024-03-10 | Discharge: 2024-03-10 | Disposition: A | Discharge status: Home / Self Care | Source: Ambulatory Visit | Attending: Physician Assistant | Admitting: Physician Assistant

## 2024-03-10 ENCOUNTER — Ambulatory Visit: Attending: Physician Assistant | Admitting: Physician Assistant

## 2024-03-10 ENCOUNTER — Ambulatory Visit (HOSPITAL_BASED_OUTPATIENT_CLINIC_OR_DEPARTMENT_OTHER)
Admission: RE | Admit: 2024-03-10 | Discharge: 2024-03-10 | Disposition: A | Discharge status: Home / Self Care | Source: Ambulatory Visit | Attending: Physician Assistant | Admitting: Physician Assistant

## 2024-03-10 VITALS — BP 145/81 | HR 67 | Temp 98.3°F | Ht 70.0 in | Wt 207.3 lb

## 2024-03-10 DIAGNOSIS — I739 Peripheral vascular disease, unspecified: Secondary | ICD-10-CM

## 2024-03-10 DIAGNOSIS — I70221 Atherosclerosis of native arteries of extremities with rest pain, right leg: Secondary | ICD-10-CM | POA: Insufficient documentation

## 2024-03-10 DIAGNOSIS — M7989 Other specified soft tissue disorders: Secondary | ICD-10-CM

## 2024-03-10 LAB — VAS US ABI WITH/WO TBI
Left ABI: 0.92
Right ABI: 1.04

## 2024-03-10 NOTE — Progress Notes (Signed)
 Office Note     CC:  follow up Requesting Provider:  Seena Thom POUR, MD  HPI: Justin Wells is a 73 y.o. ([birth year]) male who presents for surveillance of PAD.  He underwent right common femoral endarterectomy with right femoral to above-the-knee popliteal bypass with PTFE on 10/14/2023 by Dr. Sheree.  He developed a hematoma in his right above the knee incision and an infected seroma in the right groin.  He has required several return trips to the operating room for debridement most recently with sartorius muscle flap and wound VAC change on 11/07/2023.  The right groin has since healed.  He has also healed his above-the-knee popliteal incision.  He continues to have edema of the right leg.  He denies rest pain in either foot.  He also has history of left common femoral artery endarterectomy and SFA stenting.  He is on a daily aspirin , Plavix , statin.  He denies tobacco use.   Past Medical History:  Diagnosis Date   Anxiety    Arthritis    hands; back; legs (04/18/2016)   Barrett's esophagus    Cerebrovascular disease    CHF (congestive heart failure) (HCC)    said I had this a few years ago; it went away (04/18/2016)   Chronic back pain    Coronary artery disease    Depression    Gastroesophageal reflux    H/O hiatal hernia    Headache(784.0)    probably monthly (01/27/2013)   Hyperlipidemia    Hypertension    Peripheral arterial disease (HCC)    Seizures (HCC)    daily the last 5 days (04/18/2016)   Stroke 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, weak right leg and hand; I've had 2 or 3 strokes; can't remember the dates (01/27/2013)    Past Surgical History:  Procedure Laterality Date   ABDOMINAL AORTAGRAM N/A 01/26/2013   Procedure: ABDOMINAL EZELLA;  Surgeon: Gaile LELON New, MD;  Location: [facility] CATH LAB;  Service: Cardiovascular;  Laterality: N/A;   ABDOMINAL AORTOGRAM W/LOWER EXTREMITY Bilateral 05/03/2021   Procedure: ABDOMINAL AORTOGRAM W/LOWER  EXTREMITY;  Surgeon: Gretta Lonni PARAS, MD;  Location: [facility] INVASIVE CV LAB;  Service: Cardiovascular;  Laterality: Bilateral;   ABDOMINAL AORTOGRAM W/LOWER EXTREMITY N/A 08/11/2023   Procedure: ABDOMINAL AORTOGRAM W/LOWER EXTREMITY;  Surgeon: Sheree Penne Lonni, MD;  Location: [facility] INVASIVE CV LAB;  Service: Cardiovascular;  Laterality: N/A;   APPLICATION OF WOUND VAC Right 10/23/2023   Procedure: APPLICATION OF WOUND VAC;  Surgeon: Magda Debby SAILOR, MD;  Location: [facility] OR;  Service: Vascular;  Laterality: Right;  Site: Right upper leg as well.   APPLICATION OF WOUND VAC Right 11/04/2023   Procedure: APPLICATION OF WOUND VAC TO RIGHT GROIN AND RIGHT LOWER LEG;  Surgeon: Sheree Penne Lonni, MD;  Location: [facility] OR;  Service: Vascular;  Laterality: Right;   APPLICATION OF WOUND VAC Right 11/07/2023   Procedure: APPLICATION, WOUND VAC RIGHT GROIN AND RIGHT KNEE WOUND;  Surgeon: Lanis Fonda BRAVO, MD;  Location: [facility] OR;  Service: Vascular;  Laterality: Right;   CARDIAC CATHETERIZATION     CHOLECYSTECTOMY N/A 05/08/2016   Procedure: LAPAROSCOPIC CHOLECYSTECTOMY WITH INTRAOPERATIVE CHOLANGIOGRAM;  Surgeon: Donnice Lima, MD;  Location: [facility] OR;  Service: General;  Laterality: N/A;   COLONOSCOPY     CORONARY ANGIOPLASTY WITH STENT PLACEMENT  11/02/2007   2 (01/27/2013)   DIAGNOSTIC LAPAROSCOPY     twice after nissen; it kept coming apart  (01/27/2013)  ENDARTERECTOMY FEMORAL Left 05/04/2021   Procedure: COMMON ENDARTERECTOMY FEMORAL WITH LEFT LEG STENT;  Surgeon: Sheree Penne Bruckner, MD;  Location: [facility] OR;  Service: Vascular;  Laterality: Left;   ENDARTERECTOMY FEMORAL Right 10/14/2023   Procedure: RIGHT COMMON FEMORAL ENDARTERECTOMY WITH PATCH ANGIOPLASTY;  Surgeon: Sheree Penne Bruckner, MD;  Location: [facility] OR;  Service: Vascular;  Laterality: Right;   ESOPHAGOGASTRODUODENOSCOPY     FEMORAL ARTERY STENT Bilateral 01/26/2013   FEMORAL-POPLITEAL BYPASS GRAFT Right 10/14/2023   Procedure: BYPASS  GRAFT FEMORAL-POPLITEAL ARTERY;  Surgeon: Sheree Penne Bruckner, MD;  Location: [facility] OR;  Service: Vascular;  Laterality: Right;   GROIN DEBRIDEMENT Right 10/23/2023   Procedure: RIGHT GROIN AND UPPER LEG IRRIGATION AND DEBRIDEMENT WITH APPLICATION OF WOUND VAC;  Surgeon: Magda Debby SAILOR, MD;  Location: [facility] OR;  Service: Vascular;  Laterality: Right;   GROIN DEBRIDEMENT Right 10/27/2023   Procedure: GROIN DEBRIDEMENT;  Surgeon: Magda Debby SAILOR, MD;  Location: [facility] OR;  Service: Vascular;  Laterality: Right;   HERNIA REPAIR     INCISION AND DRAINAGE Right 10/27/2023   Procedure: INCISION AND DRAINAGE POPLITEAL INCISION;  Surgeon: Magda Debby SAILOR, MD;  Location: [facility] OR;  Service: Vascular;  Laterality: Right;   INCISION AND DRAINAGE OF WOUND Right 11/04/2023   Procedure: IRRIGATION AND DEBRIDEMENT RIGHT GROIN AND LOWER LEG WOUND;  Surgeon: Sheree Penne Bruckner, MD;  Location: [facility] OR;  Service: Vascular;  Laterality: Right;   INCISION AND DRAINAGE OF WOUND Right 11/07/2023   Procedure: IRRIGATION AND DEBRIDEMENT WOUND RIGHT GROIN AND RIGHT KNEE WOUND;  Surgeon: Lanis Fonda BRAVO, MD;  Location: [facility] OR;  Service: Vascular;  Laterality: Right;   INSERTION OF ILIAC STENT Left 05/04/2021   Procedure: INSERTION OF LEFT SUPERFICIAL FEMORAL ARTERY 6 X 80 ELUVIA  STENT;  Surgeon: Sheree Penne Bruckner, MD;  Location: [facility] OR;  Service: Vascular;  Laterality: Left;   LAPAROSCOPIC GASTRIC BANDING  2010   LAPAROSCOPIC NISSEN FUNDOPLICATION  ?2001   LEFT HEART CATHETERIZATION WITH CORONARY ANGIOGRAM N/A 03/09/2014   Procedure: LEFT HEART CATHETERIZATION WITH CORONARY ANGIOGRAM;  Surgeon: Deatrice DELENA Cage, MD;  Location: [facility] CATH LAB;  Service: Cardiovascular;  Laterality: N/A;   LOWER EXTREMITY ANGIOGRAM Left 03/02/2013   Procedure: LOWER EXTREMITY ANGIOGRAM;  Surgeon: Gaile LELON New, MD;  Location: [facility] CATH LAB;  Service: Cardiovascular;  Laterality: Left;   LOWER EXTREMITY ANGIOGRAM Left 05/04/2021   Procedure: LOWER  EXTREMITY ANGIOGRAM;  Surgeon: Sheree Penne Bruckner, MD;  Location: [facility] OR;  Service: Vascular;  Laterality: Left;   MUSCLE FLAP CLOSURE Right 11/04/2023   Procedure: CLOSURE OF RIGHT GROIN WOUND, USING MUSCLE FLAP;  Surgeon: Sheree Penne Bruckner, MD;  Location: [facility] OR;  Service: Vascular;  Laterality: Right;   PATCH ANGIOPLASTY  05/04/2021   Procedure: PATCH ANGIOPLASTY OF LEFT FEMORAL ARTERY USING 1X6 XENOSURE BOVINE PATCH;  Surgeon: Sheree Penne Bruckner, MD;  Location: [facility] OR;  Service: Vascular;;    Social History   Socioeconomic History   Marital status: Married    Spouse name: sheryl   Number of children: 3   Years of education: 11th   Highest education level: Not on file  Occupational History   Occupation: WELDER    Associate Professor: [employer]  Tobacco Use   Smoking status: Former    Current packs/day: 0.00    Average packs/day: 2.0 packs/day for 35.0 years (70.0 ttl pk-yrs)    Types: Cigarettes    Start date: 01/28/1965    Quit date: 01/29/2000  Years since quitting: 24.1   Smokeless tobacco: Never  Vaping Use   Vaping status: Never Used  Substance and Sexual Activity   Alcohol  use: No    Comment: 04/18/2016  quit drinking in 07/1999; I'm a recovering alcoholic   Drug use: No   Sexual activity: Not Currently  Other Topics Concern   Not on file  Social History Narrative   Patient lives with wife.   Caffeine Use: greater than 5 servings daily   Patient is right handed.   Patient has 11th grade education.   Social Drivers of Corporate investment banker Strain: Low Risk  (04/05/2020)   Received from [facility]   Overall Financial Resource Strain (CARDIA)    Difficulty of Paying Living Expenses: Not hard at all  Food Insecurity: No Food Insecurity (12/24/2023)   Received from [facility]   Hunger Vital Sign    Within the past 12 months, you worried that your food would run out before you got the money to buy more.: Never true    Within the  past 12 months, the food you bought just didn't last and you didn't have money to get more.: Never true  Transportation Needs: No Transportation Needs (12/24/2023)   Received from [facility]   PRAPARE - Transportation    Lack of Transportation (Medical): No    Lack of Transportation (Non-Medical): No  Physical Activity: Inactive (04/05/2020)   Received from [facility]   Exercise Vital Sign    On average, how many days per week do you engage in moderate to strenuous exercise (like a brisk walk)?: 0 days    On average, how many minutes do you engage in exercise at this level?: 0 min  Stress: No Stress Concern Present (04/05/2020)   Received from [facility] - Occupational Stress Questionnaire    Feeling of Stress : Only a little  Social Connections: Unknown (11/05/2023)   Social Connection and Isolation Panel    Frequency of Communication with Friends and Family: Patient declined    Frequency of Social Gatherings with Friends and Family: Patient declined    Attends Religious Services: Patient declined    Database administrator or Organizations: Yes    Attends Banker Meetings: Patient declined    Marital Status: Patient declined  Intimate Partner Violence: Patient Declined (11/05/2023)   Humiliation, Afraid, Rape, and Kick questionnaire    Fear of Current or Ex-Partner: Patient declined    Emotionally Abused: Patient declined    Physically Abused: Patient declined    Sexually Abused: Patient declined    Family History  Problem Relation Age of Onset   Heart disease Mother        Heart Disease before age 7   Hypertension Mother    Heart attack Mother    Heart attack Father     Current Outpatient Medications  Medication Sig Dispense Refill   acetaminophen  (TYLENOL ) 500 MG tablet Take 1,000 mg by mouth every 6 (six) hours as needed for moderate pain.     aspirin  EC 81 MG tablet Take 81 mg by mouth at bedtime. Swallow  whole.     atorvastatin  (LIPITOR ) 80 MG tablet Take 80 mg by mouth at bedtime.     clopidogrel  (PLAVIX ) 75 MG tablet Take 1 tablet by mouth once daily 90 tablet 3   DULoxetine  (CYMBALTA ) 60 MG capsule Take 60 mg by mouth at bedtime.  esomeprazole (NEXIUM) 40 MG capsule Take 40 mg by mouth in the morning and at bedtime.     finasteride  (PROSCAR ) 5 MG tablet Take 5 mg by mouth in the morning.     fluticasone  (FLONASE ) 50 MCG/ACT nasal spray Place 2 sprays into both nostrils in the morning and at bedtime.     furosemide  (LASIX ) 20 MG tablet Take 1 tablet by mouth daily.     gabapentin  (NEURONTIN ) 100 MG capsule Take 100-200 mg by mouth See admin instructions. Take 100 mg by mouth in the morning and 200 mg by mouth at bedtime.     isosorbide  mononitrate (IMDUR ) 60 MG 24 hr tablet Take 60 mg by mouth every morning.     levETIRAcetam  (KEPPRA ) 750 MG tablet Take 1 tablet (750 mg total) by mouth 2 (two) times daily. Take 1.5 tablets twice daily (Patient taking differently: Take 750 mg by mouth 2 (two) times daily.) 60 tablet 1   loratadine  (CLARITIN ) 10 MG tablet Take 10 mg by mouth in the morning.     metoprolol  succinate (TOPROL -XL) 25 MG 24 hr tablet Take 25 mg by mouth in the morning.     mirtazapine  (REMERON ) 15 MG tablet Take 15 mg by mouth at bedtime.     morphine  (MS CONTIN ) 15 MG 12 hr tablet Take 15 mg by mouth 2 (two) times daily.     nitroGLYCERIN  (NITROSTAT ) 0.4 MG SL tablet Place 0.4 mg under the tongue every 5 (five) minutes as needed for chest pain.     Oxycodone  HCl 10 MG TABS Take 10 mg by mouth every 4 (four) hours.     polyethylene glycol (MIRALAX  / GLYCOLAX ) 17 g packet Take 17 g by mouth daily as needed for moderate constipation.     tamsulosin  (FLOMAX ) 0.4 MG CAPS capsule Take 0.4 mg by mouth in the morning.     Tetrahydrozoline HCl (VISINE OP) Place 1 drop into both eyes daily as needed (dry eyes).     valsartan (DIOVAN) 40 MG tablet Take 40 mg by mouth in the morning.     No  current facility-administered medications for this visit.    No Known Allergies   REVIEW OF SYSTEMS:   [X]  denotes positive finding, [ ]  denotes negative finding Cardiac  Comments:  Chest pain or chest pressure:    Shortness of breath upon exertion:    Short of breath when lying flat:    Irregular heart rhythm:        Vascular    Pain in calf, thigh, or hip brought on by ambulation:    Pain in feet at night that wakes you up from your sleep:     Blood clot in your veins:    Leg swelling:         Pulmonary    Oxygen at home:    Productive cough:     Wheezing:         Neurologic    Sudden weakness in arms or legs:     Sudden numbness in arms or legs:     Sudden onset of difficulty speaking or slurred speech:    Temporary loss of vision in one eye:     Problems with dizziness:         Gastrointestinal    Blood in stool:     Vomited blood:         Genitourinary    Burning when urinating:     Blood in urine:  Psychiatric    Major depression:         Hematologic    Bleeding problems:    Problems with blood clotting too easily:        Skin    Rashes or ulcers:        Constitutional    Fever or chills:      PHYSICAL EXAMINATION:  Vitals:   03/10/24 1014  BP: (!) 145/81  Pulse: 67  Temp: 98.3 F (36.8 C)  TempSrc: Temporal  SpO2: 96%  Weight: 207 lb 4.8 oz (94 kg)  Height: 5' 10 (1.778 m)    General:  WDWN in NAD; vital signs documented above Gait: Not observed HENT: WNL, normocephalic Pulmonary: normal non-labored breathing Cardiac: regular HR Abdomen: soft, NT, no masses Skin: without rashes Vascular Exam/Pulses: Brisk DP and PT signals by Doppler bilaterally; exam limited due to edematous feet and ankles Extremities: without ischemic changes, without Gangrene , without cellulitis; without open wounds;  Musculoskeletal: no muscle wasting or atrophy  Neurologic: A&O X 3 Psychiatric:  The pt has Normal affect.   Non-Invasive Vascular  Imaging:   Widely patent right leg bypass on duplex  ABI/TBIToday's ABIToday's TBIPrevious ABIPrevious TBI  +-------+-----------+-----------+------------+------------+  Right 1.04       0.58       0.74        0.42          +-------+-----------+-----------+------------+------------+  Left  0.92       0.65       0.86        0.52            ASSESSMENT/PLAN:: 73 y.o. male who returns to clinic for surveillance of PAD with history of revascularization of bilateral lower extremities.  Right groin and palpable tibial incision have completely healed.  Bilateral lower extremities are well-perfused on exam and by ABI/TBI.  Right leg duplex demonstrates a widely patent bypass.  I have encouraged him to elevate his legs during the day when he is resting.  He should also get in the habit of wearing knee-high compression 15 to 20 mmHg on a daily basis.  We will repeat bilateral lower extremity arterial duplex and ABI in 3 months.  He will continue his aspirin , Plavix , statin daily.  He knows to notify the office with any questions or concerns.   Donnice Sender, PA-C [facility] [phone]  Clinic MD:   Sheree

## 2024-03-11 ENCOUNTER — Other Ambulatory Visit: Payer: Self-pay | Admitting: *Deleted

## 2024-03-11 DIAGNOSIS — I70229 Atherosclerosis of native arteries of extremities with rest pain, unspecified extremity: Secondary | ICD-10-CM

## 2024-03-11 DIAGNOSIS — I70211 Atherosclerosis of native arteries of extremities with intermittent claudication, right leg: Secondary | ICD-10-CM

## 2024-03-11 DIAGNOSIS — I739 Peripheral vascular disease, unspecified: Secondary | ICD-10-CM

## 2024-03-11 DIAGNOSIS — I70221 Atherosclerosis of native arteries of extremities with rest pain, right leg: Secondary | ICD-10-CM

## 2024-05-20 ENCOUNTER — Telehealth: Payer: Self-pay

## 2024-05-20 NOTE — Telephone Encounter (Signed)
 [name], pt's wife called reporting pt's right leg swelling with blisters weeping serous fluid and left leg increasing claudication.   Pt knows to use compression and elevate legs. Wife reports wearing compression stocking causes pt's legs to swell.  Adivsed pt to wrap legs with ACE wrap, moderate compression.   Will move up pt's US  and reschedule appt with provider.

## 2024-06-08 NOTE — Telephone Encounter (Signed)
 Patient is requesting the following refill Requested Prescriptions   Pending Prescriptions Disp Refills  . oxyCODONE  (ROXICODONE ) 10 mg immediate release tablet 140 tablet 0    Sig: Take 1 tablet (10 mg total) by mouth every four (4) hours as needed for pain.  . morphine  (MS CONTIN ) 15 MG 12 hr tablet 60 tablet 0    Sig: Take 1 tablet (15 mg total) by mouth every twelve (12) hours.    Last refill given on: 05/13/2024 with 60 count and 0 refills.   Recent Visits Date Type Provider Dept  05/27/24 Office Visit Seena Thom Duncans, MD [facility]  05/04/24 Office Visit Betti, [place] LABOR, MD [facility]  04/28/24 Office Visit Betti Asberry LABOR, MD [facility]  03/18/24 Office Visit Seena Thom Duncans, MD [facility]  02/26/24 Office Visit Seena Thom Duncans, MD [facility]  01/27/24 Office Visit Seena Thom Duncans, MD [facility]  12/26/23 Office Visit Gretta Coolidge Grebe, [place] [facility]  12/24/23 Office Visit Seena Thom Duncans, MD [facility]  09/18/23 Office Visit Seena Thom Duncans, MD [facility]  06/18/23 Office Visit Seena Thom Duncans, MD [facility]  Showing recent visits within past 365 days and meeting all other requirements Future Appointments Date Type Provider Dept  06/23/24 Appointment Seena Thom Duncans, MD [facility]  Showing future appointments within next 365 days and meeting all other requirements    Opioid Monitoring  Urine Tox Screen Last Drug Screen Date: 06/18/2023 Opiate Confirmation Test Last Drug Screen Date: 06/20/2023 Last Opioid Dispensed Provider: Thom Duncans Seena, MD Prescribed MEDD : 120 Last PDMP Review: 05/07/2024  3:04 PM Last Opioid Pain Agreement Signed Date: 12/30/2023 Last Non Opioid Controlled Substance Pain Agreement Signed Date: Not Found  Naloxone  Ordered: Not Found Last OV:  05/27/2024   Patient is requesting the following refill Requested Prescriptions   Pending Prescriptions Disp Refills  . oxyCODONE  (ROXICODONE ) 10 mg immediate release tablet 140 tablet 0    Sig: Take 1 tablet (10 mg total) by mouth every four (4) hours as needed for pain.  . morphine  (MS CONTIN ) 15 MG 12 hr tablet 60 tablet 0    Sig: Take 1 tablet (15 mg total) by mouth every twelve (12) hours.    Last refill given on: 05/16/2024 with 140 count and 0 refills.   Recent Visits Date Type Provider Dept  05/27/24 Office Visit Seena Thom Duncans, MD [facility]  05/04/24 Office Visit Betti, [place] LABOR, MD [facility]  04/28/24 Office Visit Betti, [place] LABOR, MD [facility]  03/18/24 Office Visit Seena Thom Duncans, MD [facility]  02/26/24 Office Visit Seena Thom Duncans, MD [facility]  01/27/24 Office Visit Seena Thom Duncans, MD [facility]  12/26/23 Office Visit Gretta Coolidge Grebe, [place] [facility]  12/24/23 Office Visit Seena Thom Duncans, MD [facility]  09/18/23 Office Visit Seena Thom Duncans, MD [facility]  06/18/23 Office Visit Seena Thom Duncans, MD [facility]  Showing recent visits within past 365 days and meeting all other requirements Future Appointments Date Type Provider Dept  06/23/24 Appointment Seena Thom Duncans, MD [facility]  Showing future appointments within next 365 days and meeting all other requirements    Opioid Monitoring  Urine Tox Screen Last Drug Screen Date: 06/18/2023 Opiate Confirmation Test Last Drug Screen Date: 06/20/2023 Last Opioid Dispensed Provider: Thom Rockey Scurry, MD Prescribed MEDD : 120 Last PDMP Review: 05/07/2024  3:04 PM Last Opioid Pain Agreement Signed Date: 12/30/2023 Last Non Opioid Controlled Substance Pain Agreement Signed Date: Not Found  Naloxone  Ordered: Not  Found Last OV: 05/27/2024

## 2024-06-09 ENCOUNTER — Ambulatory Visit (HOSPITAL_BASED_OUTPATIENT_CLINIC_OR_DEPARTMENT_OTHER)
Admission: RE | Admit: 2024-06-09 | Discharge: 2024-06-09 | Disposition: A | Discharge status: Home / Self Care | Source: Ambulatory Visit | Attending: Vascular Surgery | Admitting: Vascular Surgery

## 2024-06-09 ENCOUNTER — Encounter (HOSPITAL_COMMUNITY): Payer: Self-pay

## 2024-06-09 ENCOUNTER — Ambulatory Visit (INDEPENDENT_AMBULATORY_CARE_PROVIDER_SITE_OTHER): Admitting: Physician Assistant

## 2024-06-09 ENCOUNTER — Ambulatory Visit (HOSPITAL_COMMUNITY)
Admission: RE | Admit: 2024-06-09 | Discharge: 2024-06-09 | Disposition: A | Discharge status: Home / Self Care | Source: Ambulatory Visit | Attending: Vascular Surgery | Admitting: Vascular Surgery

## 2024-06-09 VITALS — BP 161/86 | HR 65 | Temp 97.7°F | Wt 213.8 lb

## 2024-06-09 DIAGNOSIS — I70229 Atherosclerosis of native arteries of extremities with rest pain, unspecified extremity: Secondary | ICD-10-CM

## 2024-06-09 DIAGNOSIS — I739 Peripheral vascular disease, unspecified: Secondary | ICD-10-CM

## 2024-06-09 DIAGNOSIS — Z9889 Other specified postprocedural states: Secondary | ICD-10-CM | POA: Insufficient documentation

## 2024-06-09 DIAGNOSIS — I70211 Atherosclerosis of native arteries of extremities with intermittent claudication, right leg: Secondary | ICD-10-CM | POA: Diagnosis not present

## 2024-06-09 DIAGNOSIS — I70221 Atherosclerosis of native arteries of extremities with rest pain, right leg: Secondary | ICD-10-CM | POA: Diagnosis present

## 2024-06-09 LAB — VAS US ABI WITH/WO TBI
Left ABI: 0.9
Right ABI: 0.98

## 2024-06-09 NOTE — Progress Notes (Signed)
 HISTORY AND PHYSICAL     CC:  follow up. Requesting Provider:  Seena Thom POUR, MD  HPI: This is a 73 y.o. male who is here today for follow up for PAD.  Pt has hx of right common femoral endarterectomy with right femoral to above-the-knee popliteal bypass with PTFE on 10/14/2023 by Dr. Sheree. He developed a hematoma in his right above the knee incision and an infected seroma in the right groin. He has required several return trips to the operating room for debridement most recently with sartorius muscle flap and wound VAC change on 11/07/2023. The right groin has since healed. On 05/04/2021, he underwent Left common femoral endarterectomy with bovine pericardial patch angioplasty, stent of left SFA popliteal segment by Dr. Sheree for CLI with rest pain.  Pt was last seen 03/10/2024 and at that time, he was still having some swelling in the right leg.  He was not having any claudication, rest pain or non healing wounds.   The pt returns today for follow up.  He states he still gets swelling in the right leg since surgery.  He has had several bouts of cellulitis in the right leg.  He has worn compression but when he takes them off, it opens up his skin.  He has done unna boots and it did help.  He has taken cipro  for the cellulitis and it has helped.    He denies any rest pain or non healing wounds on his feet.  He gets cramping at night.  He is on asa/statin/plavix .   He takes OxyContin  at night for his pain.    The pt is on a statin for cholesterol management.    The pt is on an aspirin .    Other AC:  Plavix  The pt is on ARB, diuretic for hypertension.  The pt is not on diabetic medication. Tobacco hx:  former    Past Medical History:  Diagnosis Date   Anxiety    Arthritis    hands; back; legs (04/18/2016)   Barrett's esophagus    Cerebrovascular disease    CHF (congestive heart failure) (HCC)    said I had this a few years ago; it went away (04/18/2016)   Chronic back pain    Coronary artery  disease    Depression    Gastroesophageal reflux    H/O hiatal hernia    Headache(784.0)    probably monthly (01/27/2013)   Hyperlipidemia    Hypertension    Peripheral arterial disease    Seizures (HCC)    daily the last 5 days (04/18/2016)   Stroke [facility]) 2009  2010   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, weak right leg and hand; I've had 2 or 3 strokes; can't remember the dates (01/27/2013)    Past Surgical History:  Procedure Laterality Date   ABDOMINAL AORTAGRAM N/A 01/26/2013   Procedure: ABDOMINAL EZELLA;  Surgeon: Gaile LELON New, MD;  Location: [facility] CATH LAB;  Service: Cardiovascular;  Laterality: N/A;   ABDOMINAL AORTOGRAM W/LOWER EXTREMITY Bilateral 05/03/2021   Procedure: ABDOMINAL AORTOGRAM W/LOWER EXTREMITY;  Surgeon: Gretta Lonni PARAS, MD;  Location: [facility] INVASIVE CV LAB;  Service: Cardiovascular;  Laterality: Bilateral;   ABDOMINAL AORTOGRAM W/LOWER EXTREMITY N/A 08/11/2023   Procedure: ABDOMINAL AORTOGRAM W/LOWER EXTREMITY;  Surgeon: Sheree Penne Lonni, MD;  Location: [facility] INVASIVE CV LAB;  Service: Cardiovascular;  Laterality: N/A;   APPLICATION OF WOUND VAC Right 10/23/2023   Procedure: APPLICATION OF WOUND VAC;  Surgeon: Magda Debby SAILOR, MD;  Location: [facility] OR;  Service: Vascular;  Laterality: Right;  Site: Right upper leg as well.   APPLICATION OF WOUND VAC Right 11/04/2023   Procedure: APPLICATION OF WOUND VAC TO RIGHT GROIN AND RIGHT LOWER LEG;  Surgeon: Sheree Penne Bruckner, MD;  Location: [facility] OR;  Service: Vascular;  Laterality: Right;   APPLICATION OF WOUND VAC Right 11/07/2023   Procedure: APPLICATION, WOUND VAC RIGHT GROIN AND RIGHT KNEE WOUND;  Surgeon: Lanis Fonda BRAVO, MD;  Location: [facility] OR;  Service: Vascular;  Laterality: Right;   CARDIAC CATHETERIZATION     CHOLECYSTECTOMY N/A 05/08/2016   Procedure: LAPAROSCOPIC CHOLECYSTECTOMY WITH INTRAOPERATIVE CHOLANGIOGRAM;  Surgeon: Donnice Lima, MD;  Location: [facility] OR;  Service: General;  Laterality: N/A;    COLONOSCOPY     CORONARY ANGIOPLASTY WITH STENT PLACEMENT  11/02/2007   2 (01/27/2013)   DIAGNOSTIC LAPAROSCOPY     twice after nissen; it kept coming apart  (01/27/2013)   ENDARTERECTOMY FEMORAL Left 05/04/2021   Procedure: COMMON ENDARTERECTOMY FEMORAL WITH LEFT LEG STENT;  Surgeon: Sheree Penne Bruckner, MD;  Location: [facility] OR;  Service: Vascular;  Laterality: Left;   ENDARTERECTOMY FEMORAL Right 10/14/2023   Procedure: RIGHT COMMON FEMORAL ENDARTERECTOMY WITH PATCH ANGIOPLASTY;  Surgeon: Sheree Penne Bruckner, MD;  Location: [facility] OR;  Service: Vascular;  Laterality: Right;   ESOPHAGOGASTRODUODENOSCOPY     FEMORAL ARTERY STENT Bilateral 01/26/2013   FEMORAL-POPLITEAL BYPASS GRAFT Right 10/14/2023   Procedure: BYPASS GRAFT FEMORAL-POPLITEAL ARTERY;  Surgeon: Sheree Penne Bruckner, MD;  Location: [facility] OR;  Service: Vascular;  Laterality: Right;   GROIN DEBRIDEMENT Right 10/23/2023   Procedure: RIGHT GROIN AND UPPER LEG IRRIGATION AND DEBRIDEMENT WITH APPLICATION OF WOUND VAC;  Surgeon: Magda Debby SAILOR, MD;  Location: [facility] OR;  Service: Vascular;  Laterality: Right;   GROIN DEBRIDEMENT Right 10/27/2023   Procedure: GROIN DEBRIDEMENT;  Surgeon: Magda Debby SAILOR, MD;  Location: [facility] OR;  Service: Vascular;  Laterality: Right;   HERNIA REPAIR     INCISION AND DRAINAGE Right 10/27/2023   Procedure: INCISION AND DRAINAGE POPLITEAL INCISION;  Surgeon: Magda Debby SAILOR, MD;  Location: [facility] OR;  Service: Vascular;  Laterality: Right;   INCISION AND DRAINAGE OF WOUND Right 11/04/2023   Procedure: IRRIGATION AND DEBRIDEMENT RIGHT GROIN AND LOWER LEG WOUND;  Surgeon: Sheree Penne Bruckner, MD;  Location: [facility] OR;  Service: Vascular;  Laterality: Right;   INCISION AND DRAINAGE OF WOUND Right 11/07/2023   Procedure: IRRIGATION AND DEBRIDEMENT WOUND RIGHT GROIN AND RIGHT KNEE WOUND;  Surgeon: Lanis Fonda BRAVO, MD;  Location: [facility] OR;  Service: Vascular;  Laterality: Right;   INSERTION OF ILIAC STENT Left 05/04/2021    Procedure: INSERTION OF LEFT SUPERFICIAL FEMORAL ARTERY 6 X 80 ELUVIA  STENT;  Surgeon: Sheree Penne Bruckner, MD;  Location: [facility] OR;  Service: Vascular;  Laterality: Left;   LAPAROSCOPIC GASTRIC BANDING  2010   LAPAROSCOPIC NISSEN FUNDOPLICATION  ?2001   LEFT HEART CATHETERIZATION WITH CORONARY ANGIOGRAM N/A 03/09/2014   Procedure: LEFT HEART CATHETERIZATION WITH CORONARY ANGIOGRAM;  Surgeon: Deatrice DELENA Cage, MD;  Location: [facility] CATH LAB;  Service: Cardiovascular;  Laterality: N/A;   LOWER EXTREMITY ANGIOGRAM Left 03/02/2013   Procedure: LOWER EXTREMITY ANGIOGRAM;  Surgeon: Gaile LELON New, MD;  Location: [facility] CATH LAB;  Service: Cardiovascular;  Laterality: Left;   LOWER EXTREMITY ANGIOGRAM Left 05/04/2021   Procedure: LOWER EXTREMITY ANGIOGRAM;  Surgeon: Sheree Penne Bruckner, MD;  Location: [facility] OR;  Service: Vascular;  Laterality: Left;   MUSCLE FLAP CLOSURE Right 11/04/2023  Procedure: CLOSURE OF RIGHT GROIN WOUND, USING MUSCLE FLAP;  Surgeon: Sheree Penne Bruckner, MD;  Location: [facility] OR;  Service: Vascular;  Laterality: Right;   PATCH ANGIOPLASTY  05/04/2021   Procedure: PATCH ANGIOPLASTY OF LEFT FEMORAL ARTERY USING 1X6 XENOSURE BOVINE PATCH;  Surgeon: Sheree Penne Bruckner, MD;  Location: [facility] OR;  Service: Vascular;;    No Known Allergies  Current Outpatient Medications  Medication Sig Dispense Refill   acetaminophen  (TYLENOL ) 500 MG tablet Take 1,000 mg by mouth every 6 (six) hours as needed for moderate pain.     aspirin  EC 81 MG tablet Take 81 mg by mouth at bedtime. Swallow whole.     atorvastatin  (LIPITOR ) 80 MG tablet Take 80 mg by mouth at bedtime.     clopidogrel  (PLAVIX ) 75 MG tablet Take 1 tablet by mouth once daily 90 tablet 3   DULoxetine  (CYMBALTA ) 60 MG capsule Take 60 mg by mouth at bedtime.     esomeprazole (NEXIUM) 40 MG capsule Take 40 mg by mouth in the morning and at bedtime.     finasteride  (PROSCAR ) 5 MG tablet Take 5 mg by mouth in the morning.      fluticasone  (FLONASE ) 50 MCG/ACT nasal spray Place 2 sprays into both nostrils in the morning and at bedtime.     furosemide  (LASIX ) 20 MG tablet Take 1 tablet by mouth daily.     gabapentin  (NEURONTIN ) 100 MG capsule Take 100-200 mg by mouth See admin instructions. Take 100 mg by mouth in the morning and 200 mg by mouth at bedtime.     isosorbide  mononitrate (IMDUR ) 60 MG 24 hr tablet Take 60 mg by mouth every morning.     levETIRAcetam  (KEPPRA ) 750 MG tablet Take 1 tablet (750 mg total) by mouth 2 (two) times daily. Take 1.5 tablets twice daily (Patient taking differently: Take 750 mg by mouth 2 (two) times daily.) 60 tablet 1   loratadine  (CLARITIN ) 10 MG tablet Take 10 mg by mouth in the morning.     metoprolol  succinate (TOPROL -XL) 25 MG 24 hr tablet Take 25 mg by mouth in the morning.     mirtazapine  (REMERON ) 15 MG tablet Take 15 mg by mouth at bedtime.     morphine  (MS CONTIN ) 15 MG 12 hr tablet Take 15 mg by mouth 2 (two) times daily.     nitroGLYCERIN  (NITROSTAT ) 0.4 MG SL tablet Place 0.4 mg under the tongue every 5 (five) minutes as needed for chest pain.     Oxycodone  HCl 10 MG TABS Take 10 mg by mouth every 4 (four) hours.     polyethylene glycol (MIRALAX  / GLYCOLAX ) 17 g packet Take 17 g by mouth daily as needed for moderate constipation.     tamsulosin  (FLOMAX ) 0.4 MG CAPS capsule Take 0.4 mg by mouth in the morning.     Tetrahydrozoline HCl (VISINE OP) Place 1 drop into both eyes daily as needed (dry eyes).     valsartan (DIOVAN) 40 MG tablet Take 40 mg by mouth in the morning.     No current facility-administered medications for this visit.    Family History  Problem Relation Age of Onset   Heart disease Mother        Heart Disease before age 5   Hypertension Mother    Heart attack Mother    Heart attack Father     Social History   Socioeconomic History   Marital status: Married    Spouse name: sheryl   Number of  children: 3   Years of education: 11th   Highest  education level: Not on file  Occupational History   Occupation: WELDER    Associate Professor: [employer]  Tobacco Use   Smoking status: Former    Current packs/day: 0.00    Average packs/day: 2.0 packs/day for 35.0 years (70.0 ttl pk-yrs)    Types: Cigarettes    Start date: 01/28/1965    Quit date: 01/29/2000    Years since quitting: 24.3   Smokeless tobacco: Never  Vaping Use   Vaping status: Never Used  Substance and Sexual Activity   Alcohol  use: No    Comment: 04/18/2016  quit drinking in 07/1999; I'm a recovering alcoholic   Drug use: No   Sexual activity: Not Currently  Other Topics Concern   Not on file  Social History Narrative   Patient lives with wife.   Caffeine Use: greater than 5 servings daily   Patient is right handed.   Patient has 11th grade education.   Social Drivers of Corporate investment banker Strain: Low Risk  (04/05/2020)   Received from [facility]   Overall Financial Resource Strain (CARDIA)    Difficulty of Paying Living Expenses: Not hard at all  Food Insecurity: No Food Insecurity (12/24/2023)   Received from [facility]   Hunger Vital Sign    Within the past 12 months, you worried that your food would run out before you got the money to buy more.: Never true    Within the past 12 months, the food you bought just didn't last and you didn't have money to get more.: Never true  Transportation Needs: No Transportation Needs (12/24/2023)   Received from [facility]   PRAPARE - Transportation    Lack of Transportation (Medical): No    Lack of Transportation (Non-Medical): No  Physical Activity: Inactive (04/05/2020)   Received from [facility]   Exercise Vital Sign    On average, how many days per week do you engage in moderate to strenuous exercise (like a brisk walk)?: 0 days    On average, how many minutes do you engage in exercise at this level?: 0 min  Stress: No Stress Concern Present (04/05/2020)   Received from [facility] - Occupational Stress Questionnaire    Feeling of Stress : Only a little  Social Connections: Unknown (11/05/2023)   Social Connection and Isolation Panel    Frequency of Communication with Friends and Family: Patient declined    Frequency of Social Gatherings with Friends and Family: Patient declined    Attends Religious Services: Patient declined    Database administrator or Organizations: Yes    Attends Banker Meetings: Patient declined    Marital Status: Patient declined  Intimate Partner Violence: Patient Declined (11/05/2023)   Humiliation, Afraid, Rape, and Kick questionnaire    Fear of Current or Ex-Partner: Patient declined    Emotionally Abused: Patient declined    Physically Abused: Patient declined    Sexually Abused: Patient declined     REVIEW OF SYSTEMS:   [X]  denotes positive finding, [ ]  denotes negative finding Cardiac  Comments:  Chest pain or chest pressure:    Shortness of breath upon exertion:    Short of breath when lying flat:    Irregular heart rhythm:        Vascular    Pain in calf, thigh, or hip  brought on by ambulation:    Pain in feet at night that wakes you up from your sleep:     Blood clot in your veins:    Leg swelling:  x       Pulmonary    Oxygen at home:    Productive cough:     Wheezing:         Neurologic    Sudden weakness in arms or legs:     Sudden numbness in arms or legs:     Sudden onset of difficulty speaking or slurred speech:    Temporary loss of vision in one eye:     Problems with dizziness:         Gastrointestinal    Blood in stool:     Vomited blood:         Genitourinary    Burning when urinating:     Blood in urine:        Psychiatric    Major depression:         Hematologic    Bleeding problems:    Problems with blood clotting too easily:        Skin    Rashes or ulcers:        Constitutional    Fever or chills:      PHYSICAL  EXAMINATION:  Today's Vitals   06/09/24 1433  BP: (!) 161/86  Pulse: 65  Temp: 97.7 F (36.5 C)  TempSrc: Temporal  Weight: 213 lb 12.8 oz (97 kg)  PainSc: 8   PainLoc: Leg   Body mass index is 30.68 kg/m.   General:  WDWN in NAD; vital signs documented above Gait: Not observed HENT: WNL, normocephalic Pulmonary: normal non-labored breathing , without wheezing Cardiac: regular HR, without carotid bruits Skin: without rashes; right groin with scar that has healed.  Vascular Exam/Pulses:  Right Left  Radial 2+ (normal) 2+ (normal)  DP monophasic Monophasic  PT 2+ (normal) monophasic   Extremities: right leg swelling with superficial ulceration as pictured.  Musculoskeletal: no muscle wasting or atrophy  Neurologic: A&O X 3 Psychiatric:  The pt has Normal affect.   Non-Invasive Vascular Imaging:   ABI's/TBI's on 06/09/2024: Right:  0.98/0.57 - Great toe pressure: 82 Left:  0.90/0.75 - Great toe pressure: 109  Arterial duplex on 06/09/2024: +-----------+--------+-----+--------+----------+----------------+  RIGHT     PSV cm/sRatioStenosisWaveform  Comments          +-----------+--------+-----+--------+----------+----------------+  ATA Distal 57                   monophasic                  +-----------+--------+-----+--------+----------+----------------+  PTA Distal 79                   monophasic                  +-----------+--------+-----+--------+----------+----------------+  PERO Distal                               appears occluded  +-----------+--------+-----+--------+----------+----------------+     Right Graft #1: CFA to AK pop  +------------------+-------+--------+----------+---------------------------                   PSV    StenosisWaveform  Comments  cm/s                                                 +------------------+-------+--------+----------+---------------------------   Inflow           159            monophasic                            +------------------+-------+--------+----------+---------------------------  Prox Anastomosis  167            monophasic                            +------------------+-------+--------+----------+---------------------------  Proximal Graft    82             monophasic                            +------------------+-------+--------+----------+---------------------------  Mid Graft         83             monophasic                            +------------------+-------+--------+----------+---------------------------  Distal Graft      86             monophasic                            +------------------+-------+--------+----------+---------------------------  Distal Anastomosis165            monophasicArea. Anastomosis not  visualized          +-----------------+-------+--------+----------+---------------------------  Outflow          137            biphasic                              +------------------+-------+--------+----------+---------------------------   +-----------+--------+-----+--------+----------+--------+  LEFT      PSV cm/sRatioStenosisWaveform  Comments  +-----------+--------+-----+--------+----------+--------+  CFA Distal 68                   monophasic          +-----------+--------+-----+--------+----------+--------+  ATA Distal 43                   biphasic            +-----------+--------+-----+--------+----------+--------+  PTA Distal 31                   monophasic          +-----------+--------+-----+--------+----------+--------+  PERO Distal29                   monophasic          +-----------+--------+-----+--------+----------+--------+     Left Stent(s):  +---------------+---+--------------+----------+-------------------------+  Prox to Stent  68               monophasic                            +---------------+---+--------------+----------+-------------------------+  Proximal Stent 2011-49% stenosismonophasichighest of tandem lesions  +---------------+---+--------------+----------+-------------------------+  Mid Stent      91               monophasic                           +---------------+---+--------------+----------+-------------------------+  Distal Stent   64               monophasic                           +---------------+---+--------------+----------+-------------------------+  Distal to Stent35               monophasic                           +---------------+---+--------------+----------+-------------------------+   Summary:  Right: Patent graft with no significant stenosis. Distal anastomosis not visualized.   Left: Patent stent with tandem lesions in the proximal portion with the highest velocity at the lowest range of 1-49% stenosis  Previous ABI's/TBI's on 03/10/2024: Right:  1.04/0.58 - Great toe pressure: 89 Left:  0.92/0.65 - Great toe pressure:  100  Previous arterial duplex on 03/10/2024: +----------+--------+-----+---------------+----------+--------+  RIGHT    PSV cm/sRatioStenosis       Waveform  Comments  +----------+--------+-----+---------------+----------+--------+  CFA Mid   99                          monophasic          +----------+--------+-----+---------------+----------+--------+  CFA Distal112                         monophasic          +----------+--------+-----+---------------+----------+--------+  DFA      102          30-49% stenosismonophasic          +----------+--------+-----+---------------+----------+--------+  POP Prox  104                         biphasic            +----------+--------+-----+---------------+----------+--------+  POP Distal40                          biphasic            +----------+--------+-----+---------------+----------+--------+     Right  Graft #1: CFA Dist - PopA Prox  +------------------+--------+--------+----------+--------+                   PSV cm/sStenosisWaveform  Comments  +------------------+--------+--------+----------+--------+  Inflow           103             monophasic          +------------------+--------+--------+----------+--------+  Prox Anastomosis  95              triphasic           +------------------+--------+--------+----------+--------+  Proximal Graft    116             triphasic           +------------------+--------+--------+----------+--------+  Mid Graft         81              triphasic           +------------------+--------+--------+----------+--------+  Distal Graft  74              triphasic           +------------------+--------+--------+----------+--------+  Distal Anastomosis72              triphasic           +------------------+--------+--------+----------+--------+  Outflow          138             triphasic           +------------------+--------+--------+----------+--------+   Summary:  Right: 30-49% stenosis noted in the deep femoral artery. Patent BPG seen in right leg     ASSESSMENT/PLAN:: 73 y.o. male here for follow up for PAD with hx of right common femoral endarterectomy with right femoral to above-the-knee popliteal bypass with PTFE on 10/14/2023 by Dr. Sheree. He developed a hematoma in his right above the knee incision and an infected seroma in the right groin. He has required several return trips to the operating room for debridement most recently with sartorius muscle flap and wound VAC change on 11/07/2023. The right groin has since healed. On 05/04/2021, he underwent Left common femoral endarterectomy with bovine pericardial patch angioplasty, stent of left SFA popliteal segment by Dr. Sheree for CLI with rest pain.   -pt with palpable right PT pulse.  He continues to have swelling & cellulitis in the right leg since his  surgery.  Pt seen with Dr. Sheree and we will get him back in a unna boot to help with swelling.  Once this is healed, he will wear compression.  We are working on getting [facility] for weekly unna boot changes.   -continue asa/statin/plavix  -discussed importance of increased walking daily -pt will f/u in 6 months with RLE bypass duplex and LLE arterial duplex and ABI. -we will have him return in about 4-5 weeks to check his right leg.  Encouraged him to elevate his leg when he is not up and about.    Lucie Apt, [facility] [phone]  Clinic MD:   Sheree

## 2024-06-10 ENCOUNTER — Other Ambulatory Visit: Payer: Self-pay

## 2024-06-10 ENCOUNTER — Telehealth: Payer: Self-pay

## 2024-06-10 NOTE — Telephone Encounter (Signed)
 Patient contacted to discuss recent referral by Lucie Apt, PA-C for unna boot changes by a home health agency.  Messages were left at home and on pt's wife's mobile phone to call back to triage.  When patient calls back, if he is homebound, a new referral can be placed to the home health agency of his choice or to [facility] if he does not have a preference.  If he is not homebound, appointments will need to be made in the office for unna boot changes every week for 4 weeks.Robbye Dede [employer]

## 2024-06-16 ENCOUNTER — Ambulatory Visit: Attending: Vascular Surgery | Admitting: Physician Assistant

## 2024-06-16 ENCOUNTER — Encounter: Payer: Self-pay | Admitting: Physician Assistant

## 2024-06-16 DIAGNOSIS — I739 Peripheral vascular disease, unspecified: Secondary | ICD-10-CM

## 2024-06-16 DIAGNOSIS — M7989 Other specified soft tissue disorders: Secondary | ICD-10-CM

## 2024-06-16 NOTE — Progress Notes (Signed)
 Office Note     CC:  follow up Requesting Provider:  Seena Thom POUR, MD  HPI: Justin Wells is a 73 y.o. (11-Jun-1951) male who presents for Unna boot change of the right lower extremity.  He has history of right common femoral endarterectomy with right femoral to above-the-knee popliteal bypass with PTFE on 10/14/2023 by Dr. Sheree.  He developed a hematoma in his right above the knee incision and an infected seroma in the right groin. He has required several return trips to the operating room for debridement most recently with sartorius muscle flap and wound VAC change on 11/07/2023. The right groin has since healed. On 05/04/2021, he underwent Left common femoral endarterectomy with bovine pericardial patch angioplasty, stent of left SFA popliteal segment by Dr. Sheree for CLI with rest pain.   He believes the scabbing on his anterior shin has improved with Unna boot placement.  He denies rest pain in his foot.  He denies fevers, chills, nausea/vomiting.  He is on aspirin , statin, Plavix  daily.   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, weak right leg and hand; I've had 2 or 3 strokes; can't remember the dates (01/27/2013)    Past Surgical History:  Procedure Laterality Date   ABDOMINAL AORTAGRAM N/A 01/26/2013   Procedure: ABDOMINAL EZELLA;  Surgeon: Gaile LELON New, MD;  Location: [facility] CATH LAB;  Service: Cardiovascular;   Laterality: N/A;   ABDOMINAL AORTOGRAM W/LOWER EXTREMITY Bilateral 05/03/2021   Procedure: ABDOMINAL AORTOGRAM W/LOWER EXTREMITY;  Surgeon: Gretta Lonni PARAS, MD;  Location: [facility] INVASIVE CV LAB;  Service: Cardiovascular;  Laterality: Bilateral;   ABDOMINAL AORTOGRAM W/LOWER EXTREMITY N/A 08/11/2023   Procedure: ABDOMINAL AORTOGRAM W/LOWER EXTREMITY;  Surgeon: Sheree Penne Lonni, MD;  Location: [facility] INVASIVE CV LAB;  Service: Cardiovascular;  Laterality: N/A;   APPLICATION OF WOUND VAC Right 10/23/2023   Procedure: APPLICATION OF WOUND VAC;  Surgeon: Magda Debby SAILOR, MD;  Location: [facility] OR;  Service: Vascular;  Laterality: Right;  Site: Right upper leg as well.   APPLICATION OF WOUND VAC Right 11/04/2023   Procedure: APPLICATION OF WOUND VAC TO RIGHT GROIN AND RIGHT LOWER LEG;  Surgeon: Sheree Penne Lonni, MD;  Location: [facility] OR;  Service: Vascular;  Laterality: Right;   APPLICATION OF WOUND VAC Right 11/07/2023   Procedure: APPLICATION, WOUND VAC RIGHT GROIN AND RIGHT KNEE WOUND;  Surgeon: Lanis Fonda BRAVO, MD;  Location: [facility] OR;  Service: Vascular;  Laterality: Right;   CARDIAC CATHETERIZATION     CHOLECYSTECTOMY N/A 05/08/2016   Procedure: LAPAROSCOPIC CHOLECYSTECTOMY WITH INTRAOPERATIVE CHOLANGIOGRAM;  Surgeon: Donnice Lima, MD;  Location: [facility] OR;  Service: General;  Laterality: N/A;   COLONOSCOPY     CORONARY ANGIOPLASTY WITH STENT PLACEMENT  11/02/2007   2 (01/27/2013)   DIAGNOSTIC LAPAROSCOPY  twice after nissen; it kept coming apart  (01/27/2013)   ENDARTERECTOMY FEMORAL Left 05/04/2021   Procedure: COMMON ENDARTERECTOMY FEMORAL WITH LEFT LEG STENT;  Surgeon: Sheree Penne Bruckner, MD;  Location: [facility] OR;  Service: Vascular;  Laterality: Left;   ENDARTERECTOMY FEMORAL Right 10/14/2023   Procedure: RIGHT COMMON FEMORAL ENDARTERECTOMY WITH PATCH ANGIOPLASTY;  Surgeon: Sheree Penne Bruckner, MD;  Location: [facility] OR;  Service: Vascular;  Laterality: Right;   ESOPHAGOGASTRODUODENOSCOPY      FEMORAL ARTERY STENT Bilateral 01/26/2013   FEMORAL-POPLITEAL BYPASS GRAFT Right 10/14/2023   Procedure: BYPASS GRAFT FEMORAL-POPLITEAL ARTERY;  Surgeon: Sheree Penne Bruckner, MD;  Location: [facility] OR;  Service: Vascular;  Laterality: Right;   GROIN DEBRIDEMENT Right 10/23/2023   Procedure: RIGHT GROIN AND UPPER LEG IRRIGATION AND DEBRIDEMENT WITH APPLICATION OF WOUND VAC;  Surgeon: Magda Debby SAILOR, MD;  Location: [facility] OR;  Service: Vascular;  Laterality: Right;   GROIN DEBRIDEMENT Right 10/27/2023   Procedure: GROIN DEBRIDEMENT;  Surgeon: Magda Debby SAILOR, MD;  Location: [facility] OR;  Service: Vascular;  Laterality: Right;   HERNIA REPAIR     INCISION AND DRAINAGE Right 10/27/2023   Procedure: INCISION AND DRAINAGE POPLITEAL INCISION;  Surgeon: Magda Debby SAILOR, MD;  Location: [facility] OR;  Service: Vascular;  Laterality: Right;   INCISION AND DRAINAGE OF WOUND Right 11/04/2023   Procedure: IRRIGATION AND DEBRIDEMENT RIGHT GROIN AND LOWER LEG WOUND;  Surgeon: Sheree Penne Bruckner, MD;  Location: [facility] OR;  Service: Vascular;  Laterality: Right;   INCISION AND DRAINAGE OF WOUND Right 11/07/2023   Procedure: IRRIGATION AND DEBRIDEMENT WOUND RIGHT GROIN AND RIGHT KNEE WOUND;  Surgeon: Lanis Fonda BRAVO, MD;  Location: [facility] OR;  Service: Vascular;  Laterality: Right;   INSERTION OF ILIAC STENT Left 05/04/2021   Procedure: INSERTION OF LEFT SUPERFICIAL FEMORAL ARTERY 6 X 80 ELUVIA  STENT;  Surgeon: Sheree Penne Bruckner, MD;  Location: [facility] OR;  Service: Vascular;  Laterality: Left;   LAPAROSCOPIC GASTRIC BANDING  2010   LAPAROSCOPIC NISSEN FUNDOPLICATION  ?2001   LEFT HEART CATHETERIZATION WITH CORONARY ANGIOGRAM N/A 03/09/2014   Procedure: LEFT HEART CATHETERIZATION WITH CORONARY ANGIOGRAM;  Surgeon: Deatrice DELENA Cage, MD;  Location: [facility] CATH LAB;  Service: Cardiovascular;  Laterality: N/A;   LOWER EXTREMITY ANGIOGRAM Left 03/02/2013   Procedure: LOWER EXTREMITY ANGIOGRAM;  Surgeon: Gaile LELON New, MD;  Location: [facility]  CATH LAB;  Service: Cardiovascular;  Laterality: Left;   LOWER EXTREMITY ANGIOGRAM Left 05/04/2021   Procedure: LOWER EXTREMITY ANGIOGRAM;  Surgeon: Sheree Penne Bruckner, MD;  Location: [facility] OR;  Service: Vascular;  Laterality: Left;   MUSCLE FLAP CLOSURE Right 11/04/2023   Procedure: CLOSURE OF RIGHT GROIN WOUND, USING MUSCLE FLAP;  Surgeon: Sheree Penne Bruckner, MD;  Location[facility] OR;  Service: Vascular;  Laterality: Right;   PATCH ANGIOPLASTY  05/04/2021   Procedure: PATCH ANGIOPLASTY OF LEFT FEMORAL ARTERY USING 1X6 XENOSURE BOVINE PATCH;  Surgeon: Sheree Penne Bruckner, MD;  Location: [facility] OR;  Service: Vascular;;    Social History   Socioeconomic History   Marital status: Married    Spouse name: sheryl   Number of children: 3   Years of education: 11th   Highest education level: Not on file  Occupational History   Occupation: WELDER    Associate Professor: [employer]  Tobacco Use   Smoking status: Former    Current packs/day: 0.00    Average packs/day: 2.0 packs/day for 35.0 years (70.0 ttl pk-yrs)    Types: Cigarettes  Start date: 01/28/1965    Quit date: 01/29/2000    Years since quitting: 24.3   Smokeless tobacco: Never  Vaping Use   Vaping status: Never Used  Substance and Sexual Activity   Alcohol  use: No    Comment: 04/18/2016  quit drinking in 07/1999; I'm a recovering alcoholic   Drug use: No   Sexual activity: Not Currently  Other Topics Concern   Not on file  Social History Narrative   Patient lives with wife.   Caffeine Use: greater than 5 servings daily   Patient is right handed.   Patient has 11th grade education.   Social Drivers of Corporate investment banker Strain: Low Risk  (04/05/2020)   Received from [facility]   Overall Financial Resource Strain (CARDIA)    Difficulty of Paying Living Expenses: Not hard at all  Food Insecurity: No Food Insecurity (12/24/2023)   Received from [facility]   Hunger Vital Sign    Within the past  12 months, you worried that your food would run out before you got the money to buy more.: Never true    Within the past 12 months, the food you bought just didn't last and you didn't have money to get more.: Never true  Transportation Needs: No Transportation Needs (12/24/2023)   Received from [facility]   PRAPARE - Transportation    Lack of Transportation (Medical): No    Lack of Transportation (Non-Medical): No  Physical Activity: Inactive (04/05/2020)   Received from [facility]   Exercise Vital Sign    On average, how many days per week do you engage in moderate to strenuous exercise (like a brisk walk)?: 0 days    On average, how many minutes do you engage in exercise at this level?: 0 min  Stress: No Stress Concern Present (04/05/2020)   Received from [facility] of Occupational Health - Occupational Stress Questionnaire    Feeling of Stress : Only a little  Social Connections: Unknown (11/05/2023)   Social Connection and Isolation Panel    Frequency of Communication with Friends and Family: Patient declined    Frequency of Social Gatherings with Friends and Family: Patient declined    Attends Religious Services: Patient declined    Database administrator or Organizations: Yes    Attends Banker Meetings: Patient declined    Marital Status: Patient declined  Intimate Partner Violence: Patient Declined (11/05/2023)   Humiliation, Afraid, Rape, and Kick questionnaire    Fear of Current or Ex-Partner: Patient declined    Emotionally Abused: Patient declined    Physically Abused: Patient declined    Sexually Abused: Patient declined    Family History  Problem Relation Age of Onset   Heart disease Mother        Heart Disease before age 74   Hypertension Mother    Heart attack Mother    Heart attack Father     Current Outpatient Medications  Medication Sig Dispense Refill   acetaminophen  (TYLENOL ) 500 MG tablet Take 1,000 mg by mouth  every 6 (six) hours as needed for moderate pain.     aspirin  EC 81 MG tablet Take 81 mg by mouth at bedtime. Swallow whole.     atorvastatin  (LIPITOR ) 80 MG tablet Take 80 mg by mouth at bedtime.     clopidogrel  (PLAVIX ) 75 MG tablet Take 1 tablet by mouth once daily 90 tablet 3   DULoxetine  (CYMBALTA )  60 MG capsule Take 60 mg by mouth at bedtime.     esomeprazole (NEXIUM) 40 MG capsule Take 40 mg by mouth in the morning and at bedtime.     finasteride  (PROSCAR ) 5 MG tablet Take 5 mg by mouth in the morning.     fluticasone  (FLONASE ) 50 MCG/ACT nasal spray Place 2 sprays into both nostrils in the morning and at bedtime.     furosemide  (LASIX ) 20 MG tablet Take 1 tablet by mouth daily.     gabapentin  (NEURONTIN ) 100 MG capsule Take 100-200 mg by mouth See admin instructions. Take 100 mg by mouth in the morning and 200 mg by mouth at bedtime.     isosorbide  mononitrate (IMDUR ) 60 MG 24 hr tablet Take 60 mg by mouth every morning.     levETIRAcetam  (KEPPRA ) 750 MG tablet Take 1 tablet (750 mg total) by mouth 2 (two) times daily. Take 1.5 tablets twice daily (Patient taking differently: Take 750 mg by mouth 2 (two) times daily.) 60 tablet 1   loratadine  (CLARITIN ) 10 MG tablet Take 10 mg by mouth in the morning.     metoprolol  succinate (TOPROL -XL) 25 MG 24 hr tablet Take 25 mg by mouth in the morning.     mirtazapine  (REMERON ) 15 MG tablet Take 15 mg by mouth at bedtime.     morphine  (MS CONTIN ) 15 MG 12 hr tablet Take 15 mg by mouth 2 (two) times daily.     nitroGLYCERIN  (NITROSTAT ) 0.4 MG SL tablet Place 0.4 mg under the tongue every 5 (five) minutes as needed for chest pain.     Oxycodone  HCl 10 MG TABS Take 10 mg by mouth every 4 (four) hours.     polyethylene glycol (MIRALAX  / GLYCOLAX ) 17 g packet Take 17 g by mouth daily as needed for moderate constipation.     tamsulosin  (FLOMAX ) 0.4 MG CAPS capsule Take 0.4 mg by mouth in the morning.     Tetrahydrozoline HCl (VISINE OP) Place 1 drop into  both eyes daily as needed (dry eyes).     valsartan (DIOVAN) 40 MG tablet Take 40 mg by mouth in the morning.     No current facility-administered medications for this visit.    No Known Allergies   REVIEW OF SYSTEMS:  Negative unless noted in HPI [X]  denotes positive finding, [ ]  denotes negative finding Cardiac  Comments:  Chest pain or chest pressure:    Shortness of breath upon exertion:    Short of breath when lying flat:    Irregular heart rhythm:        Vascular    Pain in calf, thigh, or hip brought on by ambulation:    Pain in feet at night that wakes you up from your sleep:     Blood clot in your veins:    Leg swelling:         Pulmonary    Oxygen at home:    Productive cough:     Wheezing:         Neurologic    Sudden weakness in arms or legs:     Sudden numbness in arms or legs:     Sudden onset of difficulty speaking or slurred speech:    Temporary loss of vision in one eye:     Problems with dizziness:         Gastrointestinal    Blood in stool:     Vomited blood:         Genitourinary  Burning when urinating:     Blood in urine:        Psychiatric    Major depression:         Hematologic    Bleeding problems:    Problems with blood clotting too easily:        Skin    Rashes or ulcers:        Constitutional    Fever or chills:      PHYSICAL EXAMINATION:  There were no vitals filed for this visit.  General:  WDWN in NAD; vital signs documented above Gait: Not observed HENT: WNL, normocephalic Pulmonary: normal non-labored breathing Cardiac: regular HR Abdomen: soft, NT, no masses Skin: without rashes Vascular Exam/Pulses: Palpable right PT pulse Extremities: without ischemic changes, without Gangrene , without cellulitis; without open wounds;  Musculoskeletal: no muscle wasting or atrophy  Neurologic: A&O X 3 Psychiatric:  The pt has Normal affect.       ASSESSMENT/PLAN:: 73 y.o. male here for evaluation of right anterior  shin wounds and Unna boot change  Right leg is well-perfused with a palpable PT pulse.  The anterior shin wounds appear to be healing well.  We will continue the Unna boot at least 1 more week.  Once the [employer] is discontinued he will need to wear at least knee-high compression on a regular basis.  He has already been scheduled for a right leg bypass duplex and ABI in 6 months.  He will return in 1 week for an Unna boot change.  He will continue aspirin , Plavix , statin.   Donnice Sender, PA-C [facility] [phone]  Clinic MD:

## 2024-06-23 ENCOUNTER — Ambulatory Visit: Attending: Vascular Surgery | Admitting: Physician Assistant

## 2024-06-23 VITALS — BP 118/80 | HR 74 | Temp 97.9°F

## 2024-06-23 DIAGNOSIS — I739 Peripheral vascular disease, unspecified: Secondary | ICD-10-CM

## 2024-06-23 DIAGNOSIS — M7989 Other specified soft tissue disorders: Secondary | ICD-10-CM | POA: Diagnosis not present

## 2024-06-23 NOTE — Progress Notes (Signed)
 Office Note   History of Present Illness   Justin Wells is a 73 y.o. (12-Jan-1951) male who presents for wound check. He has history of right common femoral endarterectomy with right femoral to above-the-knee popliteal bypass with PTFE on 10/14/2023 by Dr. Sheree.  He developed a hematoma in his right above the knee incision and an infected seroma in the right groin. He has required several return trips to the operating room for debridement most recently with sartorius muscle flap and wound VAC change on 11/07/2023. The right groin has since healed. On 05/04/2021, he underwent Left common femoral endarterectomy with bovine pericardial patch angioplasty, stent of left SFA popliteal segment by Dr. Sheree for CLI with rest pain.   He presents today for wound check.  He believes that his ulcerations are almost completely healed.  He did develop a small area of new scabbing after accidentally scratching his skin.  He denies any rest pain in his right foot.  He denies any fevers or chills.  He takes a daily aspirin , statin, and Plavix . Current Outpatient Medications  Medication Sig Dispense Refill   acetaminophen  (TYLENOL ) 500 MG tablet Take 1,000 mg by mouth every 6 (six) hours as needed for moderate pain.     aspirin  EC 81 MG tablet Take 81 mg by mouth at bedtime. Swallow whole.     atorvastatin  (LIPITOR ) 80 MG tablet Take 80 mg by mouth at bedtime.     clopidogrel  (PLAVIX ) 75 MG tablet Take 1 tablet by mouth once daily 90 tablet 3   DULoxetine  (CYMBALTA ) 60 MG capsule Take 60 mg by mouth at bedtime.     esomeprazole (NEXIUM) 40 MG capsule Take 40 mg by mouth in the morning and at bedtime.     finasteride  (PROSCAR ) 5 MG tablet Take 5 mg by mouth in the morning.     fluticasone  (FLONASE ) 50 MCG/ACT nasal spray Place 2 sprays into both nostrils in the morning and at bedtime.     furosemide  (LASIX ) 20 MG tablet Take 1 tablet by mouth daily.     gabapentin  (NEURONTIN ) 100 MG capsule Take 100-200 mg by  mouth See admin instructions. Take 100 mg by mouth in the morning and 200 mg by mouth at bedtime.     isosorbide  mononitrate (IMDUR ) 60 MG 24 hr tablet Take 60 mg by mouth every morning.     levETIRAcetam  (KEPPRA ) 750 MG tablet Take 1 tablet (750 mg total) by mouth 2 (two) times daily. Take 1.5 tablets twice daily (Patient taking differently: Take 750 mg by mouth 2 (two) times daily.) 60 tablet 1   loratadine  (CLARITIN ) 10 MG tablet Take 10 mg by mouth in the morning.     metoprolol  succinate (TOPROL -XL) 25 MG 24 hr tablet Take 25 mg by mouth in the morning.     mirtazapine  (REMERON ) 15 MG tablet Take 15 mg by mouth at bedtime.     morphine  (MS CONTIN ) 15 MG 12 hr tablet Take 15 mg by mouth 2 (two) times daily.     nitroGLYCERIN  (NITROSTAT ) 0.4 MG SL tablet Place 0.4 mg under the tongue every 5 (five) minutes as needed for chest pain.     Oxycodone  HCl 10 MG TABS Take 10 mg by mouth every 4 (four) hours.     polyethylene glycol (MIRALAX  / GLYCOLAX ) 17 g packet Take 17 g by mouth daily as needed for moderate constipation.     tamsulosin  (FLOMAX ) 0.4 MG CAPS capsule Take 0.4 mg by mouth  in the morning.     Tetrahydrozoline HCl (VISINE OP) Place 1 drop into both eyes daily as needed (dry eyes).     valsartan (DIOVAN) 40 MG tablet Take 40 mg by mouth in the morning.     No current facility-administered medications for this visit.    REVIEW OF SYSTEMS (negative unless checked):   Cardiac:  []  Chest pain or chest pressure? []  Shortness of breath upon activity? []  Shortness of breath when lying flat? []  Irregular heart rhythm?  Vascular:  []  Pain in calf, thigh, or hip brought on by walking? []  Pain in feet at night that wakes you up from your sleep? []  Blood clot in your veins? [x]  Leg swelling?  Pulmonary:  []  Oxygen at home? []  Productive cough? []  Wheezing?  Neurologic:  []  Sudden weakness in arms or legs? []  Sudden numbness in arms or legs? []  Sudden onset of difficult speaking  or slurred speech? []  Temporary loss of vision in one eye? []  Problems with dizziness?  Gastrointestinal:  []  Blood in stool? []  Vomited blood?  Genitourinary:  []  Burning when urinating? []  Blood in urine?  Psychiatric:  []  Major depression  Hematologic:  []  Bleeding problems? []  Problems with blood clotting?  Dermatologic:  []  Rashes or ulcers?  Constitutional:  []  Fever or chills?  Ear/Nose/Throat:  []  Change in hearing? []  Nose bleeds? []  Sore throat?  Musculoskeletal:  []  Back pain? []  Joint pain? []  Muscle pain?   Physical Examination   Vitals:   06/23/24 1334  BP: 118/80  Pulse: 74  Temp: 97.9 F (36.6 C)  TempSrc: Temporal   There is no height or weight on file to calculate BMI.  General:  WDWN in NAD; vital signs documented above Gait: Not observed HENT: WNL, normocephalic Pulmonary: normal nonlabored breathing Cardiac: Regular Extremities: Nearly healed venous ulcerations of the right lower leg Musculoskeletal: no muscle wasting or atrophy  Neurologic: A&O X 3;  No focal weakness or paresthesias are detected Psychiatric:  The pt has Normal affect.     Medical Decision Making   Justin Wells is a 73 y.o. male who presents for wound check  The patient presents today for repeat wound check and possible Unna boot change to the right lower extremity.  His anterior shin wounds are almost completely healed.  He does have a small area of scabbing where he scratched his skin.  We will replace his Unna boot today.  We can likely discontinue his Unna boot at his next office visit.  He has already been scheduled for a right lower extremity bypass graft duplex and ABIs in 6 months.   Ahmed Holster PA-C [facility] [place] Office: [phone]  Clinic MD: Sheree

## 2024-06-30 ENCOUNTER — Ambulatory Visit: Attending: Family Medicine

## 2024-07-07 ENCOUNTER — Telehealth: Payer: Self-pay

## 2024-07-07 ENCOUNTER — Encounter

## 2024-07-07 NOTE — Telephone Encounter (Signed)
 Patient into clinic for unna boot change.  No open wounds noted on lower extremities. PA Donnice Sender advised D/C unna boot and apply 15-20 knee high compression stocking daily.   The patient was measured for stocking and they were applied in the office.   Patient knows to call the office if he notices swelling or drainage to lower extremities.

## 2024-07-14 ENCOUNTER — Ambulatory Visit

## 2024-08-02 ENCOUNTER — Telehealth: Payer: Self-pay

## 2024-08-02 NOTE — Telephone Encounter (Addendum)
 Patient's wife, Beula, called to report pt in 8/10 bilateral lower leg pain. The pain is R>L, with tightness of both lower legs from the knee to toes.  She reported the swelling has been present since his bypass surgery.  She reports that patient has no new wounds or signs of cellulitis; lower legs are warm and normal color.  She reports the patient sits most of the day in a dependent position with intermittent elevation of legs.  Pt does not wear his compression tights because they cause more swelling and are painful.    Advised patient to get larger size compression stockings or wrap feet and legs with ACE compression using moderate compression.   Advised patient to ambulate as much as possible. Advised patient to elevate legs above heart level when not ambulating. Advised patient to check with cardiology or PCP re: the swelling in his legs  [name] also reported that the pt's fingers, arms and face sometimes get very white. Advised to follow up with PCP about this.    Advised wife to wear compressions, ambulate, elevate for a week and follow up with cardiology and/or PCP. Advised to call back if he doesn't improve.

## 2024-12-08 ENCOUNTER — Encounter (HOSPITAL_COMMUNITY)

## 2024-12-08 ENCOUNTER — Ambulatory Visit

## 2024-12-08 ENCOUNTER — Other Ambulatory Visit (HOSPITAL_COMMUNITY)
